# Patient Record
Sex: Female | Born: 1935
Health system: Southern US, Community
[De-identification: ages and names within clinical notes are randomized; demographics above are authoritative.]

## PROBLEM LIST (undated history)

## (undated) DIAGNOSIS — K449 Diaphragmatic hernia without obstruction or gangrene: Secondary | ICD-10-CM

## (undated) DIAGNOSIS — B019 Varicella without complication: Secondary | ICD-10-CM

## (undated) DIAGNOSIS — I499 Cardiac arrhythmia, unspecified: Secondary | ICD-10-CM

## (undated) DIAGNOSIS — K5792 Diverticulitis of intestine, part unspecified, without perforation or abscess without bleeding: Secondary | ICD-10-CM

## (undated) DIAGNOSIS — M6289 Other specified disorders of muscle: Secondary | ICD-10-CM

## (undated) DIAGNOSIS — I2510 Atherosclerotic heart disease of native coronary artery without angina pectoris: Secondary | ICD-10-CM

## (undated) DIAGNOSIS — I471 Supraventricular tachycardia, unspecified: Secondary | ICD-10-CM

## (undated) DIAGNOSIS — M1990 Unspecified osteoarthritis, unspecified site: Secondary | ICD-10-CM

## (undated) DIAGNOSIS — I4891 Unspecified atrial fibrillation: Secondary | ICD-10-CM

## (undated) DIAGNOSIS — K219 Gastro-esophageal reflux disease without esophagitis: Secondary | ICD-10-CM

## (undated) DIAGNOSIS — I700 Atherosclerosis of aorta: Secondary | ICD-10-CM

## (undated) DIAGNOSIS — R1310 Dysphagia, unspecified: Secondary | ICD-10-CM

## (undated) DIAGNOSIS — R112 Nausea with vomiting, unspecified: Secondary | ICD-10-CM

## (undated) DIAGNOSIS — Z789 Other specified health status: Secondary | ICD-10-CM

## (undated) DIAGNOSIS — I480 Paroxysmal atrial fibrillation: Secondary | ICD-10-CM

## (undated) DIAGNOSIS — R32 Unspecified urinary incontinence: Secondary | ICD-10-CM

## (undated) DIAGNOSIS — Z9889 Other specified postprocedural states: Secondary | ICD-10-CM

## (undated) DIAGNOSIS — K862 Cyst of pancreas: Secondary | ICD-10-CM

## (undated) DIAGNOSIS — H409 Unspecified glaucoma: Secondary | ICD-10-CM

## (undated) DIAGNOSIS — M7120 Synovial cyst of popliteal space [Baker], unspecified knee: Secondary | ICD-10-CM

## (undated) DIAGNOSIS — Z9289 Personal history of other medical treatment: Secondary | ICD-10-CM

## (undated) DIAGNOSIS — K5790 Diverticulosis of intestine, part unspecified, without perforation or abscess without bleeding: Secondary | ICD-10-CM

## (undated) DIAGNOSIS — Z9181 History of falling: Secondary | ICD-10-CM

## (undated) DIAGNOSIS — D699 Hemorrhagic condition, unspecified: Secondary | ICD-10-CM

## (undated) DIAGNOSIS — C801 Malignant (primary) neoplasm, unspecified: Secondary | ICD-10-CM

## (undated) DIAGNOSIS — M109 Gout, unspecified: Secondary | ICD-10-CM

## (undated) DIAGNOSIS — G43909 Migraine, unspecified, not intractable, without status migrainosus: Secondary | ICD-10-CM

## (undated) DIAGNOSIS — D649 Anemia, unspecified: Secondary | ICD-10-CM

## (undated) DIAGNOSIS — T753XXA Motion sickness, initial encounter: Secondary | ICD-10-CM

## (undated) DIAGNOSIS — Z974 Presence of external hearing-aid: Secondary | ICD-10-CM

## (undated) DIAGNOSIS — H9193 Unspecified hearing loss, bilateral: Secondary | ICD-10-CM

## (undated) DIAGNOSIS — N390 Urinary tract infection, site not specified: Secondary | ICD-10-CM

## (undated) DIAGNOSIS — M71469 Calcium deposit in bursa, unspecified knee: Secondary | ICD-10-CM

## (undated) DIAGNOSIS — R42 Dizziness and giddiness: Secondary | ICD-10-CM

## (undated) HISTORY — DX: Cyst of pancreas: K86.2

## (undated) HISTORY — DX: Paroxysmal atrial fibrillation: I48.0

## (undated) HISTORY — PX: OTHER SURGICAL HISTORY: SHX169

## (undated) HISTORY — DX: Atherosclerotic heart disease of native coronary artery without angina pectoris: I25.10

## (undated) HISTORY — DX: Supraventricular tachycardia, unspecified: I47.10

## (undated) HISTORY — DX: Other specified disorders of muscle: M62.89

## (undated) HISTORY — PX: COMBINED HYSTERECTOMY ABDOMINAL W/ A&P REPAIR / OOPHORECTOMY: SUR292

## (undated) HISTORY — PX: DILATION AND CURETTAGE OF UTERUS: SHX78

## (undated) HISTORY — DX: Diverticulosis of intestine, part unspecified, without perforation or abscess without bleeding: K57.90

## (undated) HISTORY — PX: TOTAL ABDOMINAL HYSTERECTOMY: SHX209

## (undated) HISTORY — DX: Unspecified atrial fibrillation: I48.91

## (undated) HISTORY — PX: BREAST BIOPSY: SHX20

## (undated) HISTORY — DX: Varicella without complication: B01.9

## (undated) HISTORY — DX: Diaphragmatic hernia without obstruction or gangrene: K44.9

## (undated) HISTORY — DX: Diverticulitis of intestine, part unspecified, without perforation or abscess without bleeding: K57.92

## (undated) HISTORY — DX: Unspecified urinary incontinence: R32

## (undated) HISTORY — DX: Supraventricular tachycardia: I47.1

## (undated) HISTORY — DX: Atherosclerosis of aorta: I70.0

## (undated) HISTORY — PX: CATARACT EXTRACTION W/ INTRAOCULAR LENS IMPLANT: SHX1309

## (undated) HISTORY — DX: Unspecified osteoarthritis, unspecified site: M19.90

---
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
## 1966-01-03 HISTORY — PX: VAGINAL HYSTERECTOMY: SUR661

## 1993-01-03 HISTORY — PX: BLADDER SURGERY: SHX569

## 2005-03-31 ENCOUNTER — Emergency Department (HOSPITAL_COMMUNITY): Admission: EM | Admit: 2005-03-31 | Discharge: 2005-03-31 | Payer: Self-pay | Admitting: Emergency Medicine

## 2006-03-14 ENCOUNTER — Ambulatory Visit: Payer: Self-pay | Admitting: Ophthalmology

## 2006-09-12 ENCOUNTER — Ambulatory Visit: Payer: Self-pay | Admitting: Ophthalmology

## 2009-04-06 ENCOUNTER — Inpatient Hospital Stay (HOSPITAL_COMMUNITY): Admission: EM | Admit: 2009-04-06 | Discharge: 2009-04-09 | Payer: Self-pay | Admitting: Emergency Medicine

## 2009-04-07 ENCOUNTER — Encounter (INDEPENDENT_AMBULATORY_CARE_PROVIDER_SITE_OTHER): Payer: Self-pay

## 2009-04-20 ENCOUNTER — Encounter: Admission: RE | Admit: 2009-04-20 | Discharge: 2009-04-20 | Payer: Self-pay | Admitting: Gastroenterology

## 2009-04-30 ENCOUNTER — Emergency Department (HOSPITAL_COMMUNITY): Admission: EM | Admit: 2009-04-30 | Discharge: 2009-04-30 | Payer: Self-pay | Admitting: Emergency Medicine

## 2010-03-23 LAB — COMPREHENSIVE METABOLIC PANEL
ALT: 18 U/L (ref 0–35)
AST: 30 U/L (ref 0–37)
Albumin: 3.5 g/dL (ref 3.5–5.2)
Alkaline Phosphatase: 117 U/L (ref 39–117)
BUN: 10 mg/dL (ref 6–23)
CO2: 27 mEq/L (ref 19–32)
Calcium: 9.4 mg/dL (ref 8.4–10.5)
Chloride: 104 mEq/L (ref 96–112)
Creatinine, Ser: 0.8 mg/dL (ref 0.4–1.2)
GFR calc Af Amer: >60 mL/min (ref >60)
GFR calc non Af Amer: >60 mL/min (ref >60)
Glucose, Bld: 94 mg/dL (ref 70–99)
Potassium: 3.3 mEq/L — ABNORMAL LOW (ref 3.5–5.1)
Sodium: 139 mEq/L (ref 135–145)
Total Bilirubin: 1.2 mg/dL (ref 0.3–1.2)
Total Protein: 6.9 g/dL (ref 6.0–8.3)

## 2010-03-23 LAB — CBC
HCT: 39 % (ref 36.0–46.0)
Hemoglobin: 13.8 g/dL (ref 12.0–15.0)
MCHC: 35.3 g/dL (ref 30.0–36.0)
MCV: 96 fL (ref 78.0–100.0)
Platelets: 176 10*3/uL (ref 150–400)
RBC: 4.07 MIL/uL (ref 3.87–5.11)
RDW: 12.6 % (ref 11.5–15.5)
WBC: 8.3 10*3/uL (ref 4.0–10.5)

## 2010-03-23 LAB — DIFFERENTIAL
Basophils Absolute: 0 10*3/uL (ref 0.0–0.1)
Basophils Relative: 0 % (ref 0–1)
Eosinophils Absolute: 0.1 10*3/uL (ref 0.0–0.7)
Eosinophils Relative: 1 % (ref 0–5)
Lymphocytes Relative: 13 % (ref 12–46)
Lymphs Abs: 1.1 10*3/uL (ref 0.7–4.0)
Monocytes Absolute: 0.4 10*3/uL (ref 0.1–1.0)
Monocytes Relative: 5 % (ref 3–12)
Neutro Abs: 6.7 10*3/uL (ref 1.7–7.7)
Neutrophils Relative %: 81 % — ABNORMAL HIGH (ref 43–77)

## 2010-03-23 LAB — POCT CARDIAC MARKERS
CKMB, poc: <1 ng/mL — ABNORMAL LOW (ref 1.0–8.0)
CKMB, poc: <1 ng/mL — ABNORMAL LOW (ref 1.0–8.0)
Myoglobin, poc: 43.3 ng/mL (ref 12–200)
Myoglobin, poc: 46.2 ng/mL (ref 12–200)
Troponin i, poc: <0.05 ng/mL (ref 0.00–0.09)
Troponin i, poc: <0.05 ng/mL (ref 0.00–0.09)

## 2010-03-23 LAB — LIPASE, BLOOD: Lipase: 37 U/L (ref 11–59)

## 2010-03-24 LAB — DIFFERENTIAL
Basophils Absolute: 0 10*3/uL (ref 0.0–0.1)
Basophils Absolute: 0 10*3/uL (ref 0.0–0.1)
Basophils Relative: 0 % (ref 0–1)
Basophils Relative: 0 % (ref 0–1)
Eosinophils Absolute: 0 10*3/uL (ref 0.0–0.7)
Eosinophils Absolute: 0 10*3/uL (ref 0.0–0.7)
Eosinophils Relative: 0 % (ref 0–5)
Eosinophils Relative: 0 % (ref 0–5)
Lymphocytes Relative: 11 % — ABNORMAL LOW (ref 12–46)
Lymphocytes Relative: 8 % — ABNORMAL LOW (ref 12–46)
Lymphs Abs: 0.6 10*3/uL — ABNORMAL LOW (ref 0.7–4.0)
Lymphs Abs: 0.8 10*3/uL (ref 0.7–4.0)
Monocytes Absolute: 0.7 10*3/uL (ref 0.1–1.0)
Monocytes Absolute: 1.1 10*3/uL — ABNORMAL HIGH (ref 0.1–1.0)
Monocytes Relative: 10 % (ref 3–12)
Monocytes Relative: 13 % — ABNORMAL HIGH (ref 3–12)
Neutro Abs: 4.6 10*3/uL (ref 1.7–7.7)
Neutro Abs: 8.6 10*3/uL — ABNORMAL HIGH (ref 1.7–7.7)
Neutrophils Relative %: 76 % (ref 43–77)
Neutrophils Relative %: 82 % — ABNORMAL HIGH (ref 43–77)

## 2010-03-24 LAB — CBC
HCT: 31.5 % — ABNORMAL LOW (ref 36.0–46.0)
HCT: 32.8 % — ABNORMAL LOW (ref 36.0–46.0)
HCT: 39 % (ref 36.0–46.0)
Hemoglobin: 10.6 g/dL — ABNORMAL LOW (ref 12.0–15.0)
Hemoglobin: 11.3 g/dL — ABNORMAL LOW (ref 12.0–15.0)
Hemoglobin: 13.3 g/dL (ref 12.0–15.0)
MCHC: 33.7 g/dL (ref 30.0–36.0)
MCHC: 34.1 g/dL (ref 30.0–36.0)
MCHC: 34.4 g/dL (ref 30.0–36.0)
MCV: 96.3 fL (ref 78.0–100.0)
MCV: 96.7 fL (ref 78.0–100.0)
MCV: 96.9 fL (ref 78.0–100.0)
Platelets: 122 10*3/uL — ABNORMAL LOW (ref 150–400)
Platelets: 135 10*3/uL — ABNORMAL LOW (ref 150–400)
Platelets: 177 10*3/uL (ref 150–400)
RBC: 3.25 MIL/uL — ABNORMAL LOW (ref 3.87–5.11)
RBC: 3.4 MIL/uL — ABNORMAL LOW (ref 3.87–5.11)
RBC: 4.03 MIL/uL (ref 3.87–5.11)
RDW: 13.1 % (ref 11.5–15.5)
RDW: 13.3 % (ref 11.5–15.5)
RDW: 13.8 % (ref 11.5–15.5)
WBC: 10.5 10*3/uL (ref 4.0–10.5)
WBC: 5.5 10*3/uL (ref 4.0–10.5)
WBC: 6 10*3/uL (ref 4.0–10.5)

## 2010-03-24 LAB — COMPREHENSIVE METABOLIC PANEL
ALT: 122 U/L — ABNORMAL HIGH (ref 0–35)
ALT: 132 U/L — ABNORMAL HIGH (ref 0–35)
ALT: 146 U/L — ABNORMAL HIGH (ref 0–35)
ALT: 189 U/L — ABNORMAL HIGH (ref 0–35)
ALT: 99 U/L — ABNORMAL HIGH (ref 0–35)
AST: 104 U/L — ABNORMAL HIGH (ref 0–37)
AST: 144 U/L — ABNORMAL HIGH (ref 0–37)
AST: 161 U/L — ABNORMAL HIGH (ref 0–37)
AST: 254 U/L — ABNORMAL HIGH (ref 0–37)
AST: 74 U/L — ABNORMAL HIGH (ref 0–37)
Albumin: 2.3 g/dL — ABNORMAL LOW (ref 3.5–5.2)
Albumin: 2.3 g/dL — ABNORMAL LOW (ref 3.5–5.2)
Albumin: 2.4 g/dL — ABNORMAL LOW (ref 3.5–5.2)
Albumin: 2.7 g/dL — ABNORMAL LOW (ref 3.5–5.2)
Albumin: 3.4 g/dL — ABNORMAL LOW (ref 3.5–5.2)
Alkaline Phosphatase: 189 U/L — ABNORMAL HIGH (ref 39–117)
Alkaline Phosphatase: 232 U/L — ABNORMAL HIGH (ref 39–117)
Alkaline Phosphatase: 241 U/L — ABNORMAL HIGH (ref 39–117)
Alkaline Phosphatase: 256 U/L — ABNORMAL HIGH (ref 39–117)
Alkaline Phosphatase: 258 U/L — ABNORMAL HIGH (ref 39–117)
BUN: 4 mg/dL — ABNORMAL LOW (ref 6–23)
BUN: 5 mg/dL — ABNORMAL LOW (ref 6–23)
BUN: 6 mg/dL (ref 6–23)
BUN: 6 mg/dL (ref 6–23)
BUN: 9 mg/dL (ref 6–23)
CO2: 23 mEq/L (ref 19–32)
CO2: 25 mEq/L (ref 19–32)
CO2: 25 mEq/L (ref 19–32)
CO2: 25 mEq/L (ref 19–32)
CO2: 26 mEq/L (ref 19–32)
Calcium: 7.6 mg/dL — ABNORMAL LOW (ref 8.4–10.5)
Calcium: 7.8 mg/dL — ABNORMAL LOW (ref 8.4–10.5)
Calcium: 7.9 mg/dL — ABNORMAL LOW (ref 8.4–10.5)
Calcium: 8.1 mg/dL — ABNORMAL LOW (ref 8.4–10.5)
Calcium: 9.2 mg/dL (ref 8.4–10.5)
Chloride: 102 mEq/L (ref 96–112)
Chloride: 109 mEq/L (ref 96–112)
Chloride: 110 mEq/L (ref 96–112)
Chloride: 112 mEq/L (ref 96–112)
Chloride: 113 mEq/L — ABNORMAL HIGH (ref 96–112)
Creatinine, Ser: 0.62 mg/dL (ref 0.4–1.2)
Creatinine, Ser: 0.63 mg/dL (ref 0.4–1.2)
Creatinine, Ser: 0.68 mg/dL (ref 0.4–1.2)
Creatinine, Ser: 0.7 mg/dL (ref 0.4–1.2)
Creatinine, Ser: 0.82 mg/dL (ref 0.4–1.2)
GFR calc Af Amer: >60 mL/min (ref >60)
GFR calc Af Amer: >60 mL/min (ref >60)
GFR calc Af Amer: >60 mL/min (ref >60)
GFR calc Af Amer: >60 mL/min (ref >60)
GFR calc Af Amer: >60 mL/min (ref >60)
GFR calc non Af Amer: >60 mL/min (ref >60)
GFR calc non Af Amer: >60 mL/min (ref >60)
GFR calc non Af Amer: >60 mL/min (ref >60)
GFR calc non Af Amer: >60 mL/min (ref >60)
GFR calc non Af Amer: >60 mL/min (ref >60)
Glucose, Bld: 100 mg/dL — ABNORMAL HIGH (ref 70–99)
Glucose, Bld: 105 mg/dL — ABNORMAL HIGH (ref 70–99)
Glucose, Bld: 105 mg/dL — ABNORMAL HIGH (ref 70–99)
Glucose, Bld: 116 mg/dL — ABNORMAL HIGH (ref 70–99)
Glucose, Bld: 123 mg/dL — ABNORMAL HIGH (ref 70–99)
Potassium: 3.3 mEq/L — ABNORMAL LOW (ref 3.5–5.1)
Potassium: 3.5 mEq/L (ref 3.5–5.1)
Potassium: 3.6 mEq/L (ref 3.5–5.1)
Potassium: 3.6 mEq/L (ref 3.5–5.1)
Potassium: 3.6 mEq/L (ref 3.5–5.1)
Sodium: 137 mEq/L (ref 135–145)
Sodium: 138 mEq/L (ref 135–145)
Sodium: 140 mEq/L (ref 135–145)
Sodium: 141 mEq/L (ref 135–145)
Sodium: 143 mEq/L (ref 135–145)
Total Bilirubin: 1.3 mg/dL — ABNORMAL HIGH (ref 0.3–1.2)
Total Bilirubin: 1.4 mg/dL — ABNORMAL HIGH (ref 0.3–1.2)
Total Bilirubin: 2.5 mg/dL — ABNORMAL HIGH (ref 0.3–1.2)
Total Bilirubin: 3.5 mg/dL — ABNORMAL HIGH (ref 0.3–1.2)
Total Bilirubin: 4.1 mg/dL — ABNORMAL HIGH (ref 0.3–1.2)
Total Protein: 5 g/dL — ABNORMAL LOW (ref 6.0–8.3)
Total Protein: 5.1 g/dL — ABNORMAL LOW (ref 6.0–8.3)
Total Protein: 5.3 g/dL — ABNORMAL LOW (ref 6.0–8.3)
Total Protein: 5.5 g/dL — ABNORMAL LOW (ref 6.0–8.3)
Total Protein: 7.1 g/dL (ref 6.0–8.3)

## 2010-03-24 LAB — URINE MICROSCOPIC-ADD ON

## 2010-03-24 LAB — URINALYSIS, ROUTINE W REFLEX MICROSCOPIC
Bilirubin Urine: NEGATIVE
Glucose, UA: NEGATIVE mg/dL
Ketones, ur: NEGATIVE mg/dL
Leukocytes, UA: NEGATIVE
Nitrite: POSITIVE — AB
Protein, ur: NEGATIVE mg/dL
Specific Gravity, Urine: 1.006 (ref 1.005–1.030)
Urobilinogen, UA: 0.2 mg/dL (ref 0.0–1.0)
pH: 7 (ref 5.0–8.0)

## 2010-03-24 LAB — POCT CARDIAC MARKERS
CKMB, poc: <1 ng/mL — ABNORMAL LOW (ref 1.0–8.0)
CKMB, poc: <1 ng/mL — ABNORMAL LOW (ref 1.0–8.0)
Myoglobin, poc: 53.9 ng/mL (ref 12–200)
Myoglobin, poc: 79.8 ng/mL (ref 12–200)
Troponin i, poc: 0.05 ng/mL (ref 0.00–0.09)
Troponin i, poc: <0.05 ng/mL (ref 0.00–0.09)

## 2010-03-24 LAB — TROPONIN I: Troponin I: 0.05 ng/mL (ref 0.00–0.06)

## 2010-03-24 LAB — CK TOTAL AND CKMB (NOT AT ARMC)
CK, MB: 1 ng/mL (ref 0.3–4.0)
Relative Index: INVALID (ref 0.0–2.5)
Total CK: 52 U/L (ref 7–177)

## 2010-03-24 LAB — LIPASE, BLOOD: Lipase: 47 U/L (ref 11–59)

## 2011-01-12 DIAGNOSIS — H40039 Anatomical narrow angle, unspecified eye: Secondary | ICD-10-CM | POA: Diagnosis not present

## 2011-01-18 DIAGNOSIS — M899 Disorder of bone, unspecified: Secondary | ICD-10-CM | POA: Diagnosis not present

## 2011-01-18 DIAGNOSIS — N390 Urinary tract infection, site not specified: Secondary | ICD-10-CM | POA: Diagnosis not present

## 2011-01-18 DIAGNOSIS — M949 Disorder of cartilage, unspecified: Secondary | ICD-10-CM | POA: Diagnosis not present

## 2011-01-18 DIAGNOSIS — N63 Unspecified lump in unspecified breast: Secondary | ICD-10-CM | POA: Diagnosis not present

## 2011-01-18 DIAGNOSIS — F411 Generalized anxiety disorder: Secondary | ICD-10-CM | POA: Diagnosis not present

## 2011-01-18 DIAGNOSIS — N3090 Cystitis, unspecified without hematuria: Secondary | ICD-10-CM | POA: Diagnosis not present

## 2011-01-26 DIAGNOSIS — H4010X0 Unspecified open-angle glaucoma, stage unspecified: Secondary | ICD-10-CM | POA: Diagnosis not present

## 2011-04-14 DIAGNOSIS — T148 Other injury of unspecified body region: Secondary | ICD-10-CM | POA: Diagnosis not present

## 2011-04-26 DIAGNOSIS — H4010X0 Unspecified open-angle glaucoma, stage unspecified: Secondary | ICD-10-CM | POA: Diagnosis not present

## 2011-05-10 DIAGNOSIS — H4010X0 Unspecified open-angle glaucoma, stage unspecified: Secondary | ICD-10-CM | POA: Diagnosis not present

## 2011-05-23 DIAGNOSIS — Z79890 Hormone replacement therapy (postmenopausal): Secondary | ICD-10-CM | POA: Diagnosis not present

## 2011-05-23 DIAGNOSIS — M899 Disorder of bone, unspecified: Secondary | ICD-10-CM | POA: Diagnosis not present

## 2011-05-23 DIAGNOSIS — M949 Disorder of cartilage, unspecified: Secondary | ICD-10-CM | POA: Diagnosis not present

## 2011-05-23 DIAGNOSIS — N3090 Cystitis, unspecified without hematuria: Secondary | ICD-10-CM | POA: Diagnosis not present

## 2011-05-23 DIAGNOSIS — N6019 Diffuse cystic mastopathy of unspecified breast: Secondary | ICD-10-CM | POA: Diagnosis not present

## 2011-05-23 DIAGNOSIS — Z1231 Encounter for screening mammogram for malignant neoplasm of breast: Secondary | ICD-10-CM | POA: Diagnosis not present

## 2011-05-23 DIAGNOSIS — R159 Full incontinence of feces: Secondary | ICD-10-CM | POA: Diagnosis not present

## 2011-05-23 DIAGNOSIS — Z803 Family history of malignant neoplasm of breast: Secondary | ICD-10-CM | POA: Diagnosis not present

## 2011-05-23 DIAGNOSIS — R339 Retention of urine, unspecified: Secondary | ICD-10-CM | POA: Diagnosis not present

## 2011-05-23 DIAGNOSIS — N318 Other neuromuscular dysfunction of bladder: Secondary | ICD-10-CM | POA: Diagnosis not present

## 2011-08-09 DIAGNOSIS — H4010X0 Unspecified open-angle glaucoma, stage unspecified: Secondary | ICD-10-CM | POA: Diagnosis not present

## 2011-09-22 LAB — HM DEXA SCAN

## 2011-10-04 DIAGNOSIS — Z23 Encounter for immunization: Secondary | ICD-10-CM | POA: Diagnosis not present

## 2011-10-04 DIAGNOSIS — B308 Other viral conjunctivitis: Secondary | ICD-10-CM | POA: Diagnosis not present

## 2011-11-13 DIAGNOSIS — N200 Calculus of kidney: Secondary | ICD-10-CM | POA: Diagnosis not present

## 2011-11-13 DIAGNOSIS — R3129 Other microscopic hematuria: Secondary | ICD-10-CM | POA: Diagnosis not present

## 2011-11-15 DIAGNOSIS — K219 Gastro-esophageal reflux disease without esophagitis: Secondary | ICD-10-CM | POA: Diagnosis not present

## 2011-11-23 DIAGNOSIS — Z79899 Other long term (current) drug therapy: Secondary | ICD-10-CM | POA: Diagnosis not present

## 2011-11-23 DIAGNOSIS — M810 Age-related osteoporosis without current pathological fracture: Secondary | ICD-10-CM | POA: Diagnosis not present

## 2011-11-23 DIAGNOSIS — R030 Elevated blood-pressure reading, without diagnosis of hypertension: Secondary | ICD-10-CM | POA: Diagnosis not present

## 2011-11-23 DIAGNOSIS — E7800 Pure hypercholesterolemia, unspecified: Secondary | ICD-10-CM | POA: Diagnosis not present

## 2012-01-04 HISTORY — PX: CHOLECYSTECTOMY: SHX55

## 2012-01-16 DIAGNOSIS — D485 Neoplasm of uncertain behavior of skin: Secondary | ICD-10-CM | POA: Diagnosis not present

## 2012-01-16 DIAGNOSIS — L821 Other seborrheic keratosis: Secondary | ICD-10-CM | POA: Diagnosis not present

## 2012-02-07 DIAGNOSIS — H4010X0 Unspecified open-angle glaucoma, stage unspecified: Secondary | ICD-10-CM | POA: Diagnosis not present

## 2012-02-15 DIAGNOSIS — H40039 Anatomical narrow angle, unspecified eye: Secondary | ICD-10-CM | POA: Diagnosis not present

## 2012-03-08 DIAGNOSIS — H2510 Age-related nuclear cataract, unspecified eye: Secondary | ICD-10-CM | POA: Diagnosis not present

## 2012-03-12 DIAGNOSIS — I4949 Other premature depolarization: Secondary | ICD-10-CM | POA: Diagnosis not present

## 2012-05-08 DIAGNOSIS — H4010X0 Unspecified open-angle glaucoma, stage unspecified: Secondary | ICD-10-CM | POA: Diagnosis not present

## 2012-05-29 DIAGNOSIS — F411 Generalized anxiety disorder: Secondary | ICD-10-CM | POA: Diagnosis not present

## 2012-05-29 DIAGNOSIS — M899 Disorder of bone, unspecified: Secondary | ICD-10-CM | POA: Diagnosis not present

## 2012-05-29 DIAGNOSIS — M810 Age-related osteoporosis without current pathological fracture: Secondary | ICD-10-CM | POA: Diagnosis not present

## 2012-05-29 DIAGNOSIS — Z1231 Encounter for screening mammogram for malignant neoplasm of breast: Secondary | ICD-10-CM | POA: Diagnosis not present

## 2012-05-29 DIAGNOSIS — N3090 Cystitis, unspecified without hematuria: Secondary | ICD-10-CM | POA: Diagnosis not present

## 2012-05-29 DIAGNOSIS — R339 Retention of urine, unspecified: Secondary | ICD-10-CM | POA: Diagnosis not present

## 2012-05-29 DIAGNOSIS — Z803 Family history of malignant neoplasm of breast: Secondary | ICD-10-CM | POA: Diagnosis not present

## 2012-05-29 DIAGNOSIS — Z79899 Other long term (current) drug therapy: Secondary | ICD-10-CM | POA: Diagnosis not present

## 2012-05-29 DIAGNOSIS — Z780 Asymptomatic menopausal state: Secondary | ICD-10-CM | POA: Diagnosis not present

## 2012-05-29 DIAGNOSIS — N6019 Diffuse cystic mastopathy of unspecified breast: Secondary | ICD-10-CM | POA: Diagnosis not present

## 2012-05-29 DIAGNOSIS — N63 Unspecified lump in unspecified breast: Secondary | ICD-10-CM | POA: Diagnosis not present

## 2012-05-29 DIAGNOSIS — Z79890 Hormone replacement therapy (postmenopausal): Secondary | ICD-10-CM | POA: Diagnosis not present

## 2012-06-01 DIAGNOSIS — H353190 Nonexudative age-related macular degeneration, unspecified eye, stage unspecified: Secondary | ICD-10-CM | POA: Diagnosis not present

## 2012-06-05 ENCOUNTER — Other Ambulatory Visit: Payer: Self-pay | Admitting: Dermatology

## 2012-06-05 DIAGNOSIS — D0470 Carcinoma in situ of skin of unspecified lower limb, including hip: Secondary | ICD-10-CM | POA: Diagnosis not present

## 2012-06-05 DIAGNOSIS — L821 Other seborrheic keratosis: Secondary | ICD-10-CM | POA: Diagnosis not present

## 2012-06-05 DIAGNOSIS — D485 Neoplasm of uncertain behavior of skin: Secondary | ICD-10-CM | POA: Diagnosis not present

## 2012-06-05 DIAGNOSIS — Z85828 Personal history of other malignant neoplasm of skin: Secondary | ICD-10-CM | POA: Diagnosis not present

## 2012-06-12 DIAGNOSIS — H2510 Age-related nuclear cataract, unspecified eye: Secondary | ICD-10-CM | POA: Diagnosis not present

## 2012-06-14 DIAGNOSIS — D0470 Carcinoma in situ of skin of unspecified lower limb, including hip: Secondary | ICD-10-CM | POA: Diagnosis not present

## 2012-06-14 DIAGNOSIS — Z85828 Personal history of other malignant neoplasm of skin: Secondary | ICD-10-CM | POA: Diagnosis not present

## 2012-07-03 ENCOUNTER — Ambulatory Visit: Payer: Self-pay | Admitting: Ophthalmology

## 2012-07-03 DIAGNOSIS — Z885 Allergy status to narcotic agent status: Secondary | ICD-10-CM | POA: Diagnosis not present

## 2012-07-03 DIAGNOSIS — Z01812 Encounter for preprocedural laboratory examination: Secondary | ICD-10-CM | POA: Diagnosis not present

## 2012-07-03 DIAGNOSIS — Z01810 Encounter for preprocedural cardiovascular examination: Secondary | ICD-10-CM | POA: Diagnosis not present

## 2012-07-03 DIAGNOSIS — K219 Gastro-esophageal reflux disease without esophagitis: Secondary | ICD-10-CM | POA: Diagnosis not present

## 2012-07-03 DIAGNOSIS — H2510 Age-related nuclear cataract, unspecified eye: Secondary | ICD-10-CM | POA: Diagnosis not present

## 2012-07-03 DIAGNOSIS — E162 Hypoglycemia, unspecified: Secondary | ICD-10-CM | POA: Diagnosis not present

## 2012-07-03 DIAGNOSIS — M810 Age-related osteoporosis without current pathological fracture: Secondary | ICD-10-CM | POA: Diagnosis not present

## 2012-07-03 DIAGNOSIS — Z85828 Personal history of other malignant neoplasm of skin: Secondary | ICD-10-CM | POA: Diagnosis not present

## 2012-07-03 DIAGNOSIS — K5730 Diverticulosis of large intestine without perforation or abscess without bleeding: Secondary | ICD-10-CM | POA: Diagnosis not present

## 2012-07-03 DIAGNOSIS — R42 Dizziness and giddiness: Secondary | ICD-10-CM | POA: Diagnosis not present

## 2012-07-03 DIAGNOSIS — I10 Essential (primary) hypertension: Secondary | ICD-10-CM | POA: Diagnosis not present

## 2012-07-03 DIAGNOSIS — Z9889 Other specified postprocedural states: Secondary | ICD-10-CM | POA: Diagnosis not present

## 2012-07-03 DIAGNOSIS — M129 Arthropathy, unspecified: Secondary | ICD-10-CM | POA: Diagnosis not present

## 2012-07-03 LAB — CBC WITH DIFFERENTIAL/PLATELET
Basophil #: 0.1 x10 3/mm 3
Basophil %: 0.8 %
Eosinophil #: 0.1 x10 3/mm 3
Eosinophil %: 0.9 %
HCT: 36.9 %
HGB: 12.7 g/dL
Lymphocyte %: 18 %
Lymphs Abs: 1.2 x10 3/mm 3
MCH: 32 pg
MCHC: 34.3 g/dL
MCV: 93 fL
Monocyte #: 0.6 "x10 3/mm "
Monocyte %: 9.4 %
Neutrophil #: 4.5 x10 3/mm 3
Neutrophil %: 70.9 %
Platelet: 199 x10 3/mm 3
RBC: 3.96 X10 6/mm 3
RDW: 13.1 %
WBC: 6.4 x10 3/mm 3

## 2012-07-03 LAB — PROTIME-INR
INR: 0.9
Prothrombin Time: 12.4 secs (ref 11.5–14.7)

## 2012-07-03 LAB — APTT: Activated PTT: 34.3 s

## 2012-07-09 ENCOUNTER — Ambulatory Visit: Payer: Self-pay | Admitting: Ophthalmology

## 2012-07-09 DIAGNOSIS — Z885 Allergy status to narcotic agent status: Secondary | ICD-10-CM | POA: Diagnosis not present

## 2012-07-09 DIAGNOSIS — K219 Gastro-esophageal reflux disease without esophagitis: Secondary | ICD-10-CM | POA: Diagnosis not present

## 2012-07-09 DIAGNOSIS — H2510 Age-related nuclear cataract, unspecified eye: Secondary | ICD-10-CM | POA: Diagnosis not present

## 2012-07-09 DIAGNOSIS — Z79890 Hormone replacement therapy (postmenopausal): Secondary | ICD-10-CM | POA: Diagnosis not present

## 2012-07-09 DIAGNOSIS — H269 Unspecified cataract: Secondary | ICD-10-CM | POA: Diagnosis not present

## 2012-07-09 DIAGNOSIS — M810 Age-related osteoporosis without current pathological fracture: Secondary | ICD-10-CM | POA: Diagnosis not present

## 2012-07-09 DIAGNOSIS — R42 Dizziness and giddiness: Secondary | ICD-10-CM | POA: Diagnosis not present

## 2012-07-09 DIAGNOSIS — Z85828 Personal history of other malignant neoplasm of skin: Secondary | ICD-10-CM | POA: Diagnosis not present

## 2012-07-09 DIAGNOSIS — E162 Hypoglycemia, unspecified: Secondary | ICD-10-CM | POA: Diagnosis not present

## 2012-07-09 DIAGNOSIS — K5730 Diverticulosis of large intestine without perforation or abscess without bleeding: Secondary | ICD-10-CM | POA: Diagnosis not present

## 2012-07-09 DIAGNOSIS — M129 Arthropathy, unspecified: Secondary | ICD-10-CM | POA: Diagnosis not present

## 2012-10-05 DIAGNOSIS — Z23 Encounter for immunization: Secondary | ICD-10-CM | POA: Diagnosis not present

## 2012-12-07 DIAGNOSIS — H353190 Nonexudative age-related macular degeneration, unspecified eye, stage unspecified: Secondary | ICD-10-CM | POA: Diagnosis not present

## 2013-02-02 ENCOUNTER — Emergency Department (HOSPITAL_COMMUNITY): Payer: Medicare Other

## 2013-02-02 ENCOUNTER — Other Ambulatory Visit: Payer: Self-pay

## 2013-02-02 ENCOUNTER — Encounter (HOSPITAL_COMMUNITY): Payer: Self-pay | Admitting: Emergency Medicine

## 2013-02-02 ENCOUNTER — Inpatient Hospital Stay (HOSPITAL_COMMUNITY)
Admission: EM | Admit: 2013-02-02 | Discharge: 2013-02-04 | DRG: 690 | Disposition: A | Discharge status: Home / Self Care | Payer: Medicare Other | Attending: Internal Medicine | Admitting: Internal Medicine

## 2013-02-02 DIAGNOSIS — R634 Abnormal weight loss: Secondary | ICD-10-CM | POA: Diagnosis not present

## 2013-02-02 DIAGNOSIS — N3090 Cystitis, unspecified without hematuria: Secondary | ICD-10-CM | POA: Diagnosis not present

## 2013-02-02 DIAGNOSIS — N179 Acute kidney failure, unspecified: Secondary | ICD-10-CM

## 2013-02-02 DIAGNOSIS — Z8041 Family history of malignant neoplasm of ovary: Secondary | ICD-10-CM

## 2013-02-02 DIAGNOSIS — R52 Pain, unspecified: Secondary | ICD-10-CM | POA: Diagnosis not present

## 2013-02-02 DIAGNOSIS — R339 Retention of urine, unspecified: Secondary | ICD-10-CM | POA: Diagnosis not present

## 2013-02-02 DIAGNOSIS — Z832 Family history of diseases of the blood and blood-forming organs and certain disorders involving the immune mechanism: Secondary | ICD-10-CM

## 2013-02-02 DIAGNOSIS — R109 Unspecified abdominal pain: Secondary | ICD-10-CM

## 2013-02-02 DIAGNOSIS — R0789 Other chest pain: Secondary | ICD-10-CM

## 2013-02-02 DIAGNOSIS — Z7982 Long term (current) use of aspirin: Secondary | ICD-10-CM | POA: Diagnosis not present

## 2013-02-02 DIAGNOSIS — Z681 Body mass index (BMI) 19 or less, adult: Secondary | ICD-10-CM

## 2013-02-02 DIAGNOSIS — M949 Disorder of cartilage, unspecified: Secondary | ICD-10-CM | POA: Diagnosis not present

## 2013-02-02 DIAGNOSIS — M899 Disorder of bone, unspecified: Secondary | ICD-10-CM | POA: Diagnosis not present

## 2013-02-02 DIAGNOSIS — Z79899 Other long term (current) drug therapy: Secondary | ICD-10-CM

## 2013-02-02 DIAGNOSIS — M8580 Other specified disorders of bone density and structure, unspecified site: Secondary | ICD-10-CM | POA: Diagnosis present

## 2013-02-02 DIAGNOSIS — M549 Dorsalgia, unspecified: Secondary | ICD-10-CM | POA: Diagnosis not present

## 2013-02-02 DIAGNOSIS — N281 Cyst of kidney, acquired: Secondary | ICD-10-CM | POA: Diagnosis not present

## 2013-02-02 DIAGNOSIS — R609 Edema, unspecified: Secondary | ICD-10-CM | POA: Diagnosis not present

## 2013-02-02 DIAGNOSIS — K5730 Diverticulosis of large intestine without perforation or abscess without bleeding: Secondary | ICD-10-CM | POA: Diagnosis not present

## 2013-02-02 DIAGNOSIS — R935 Abnormal findings on diagnostic imaging of other abdominal regions, including retroperitoneum: Secondary | ICD-10-CM | POA: Diagnosis not present

## 2013-02-02 DIAGNOSIS — N19 Unspecified kidney failure: Secondary | ICD-10-CM

## 2013-02-02 DIAGNOSIS — N289 Disorder of kidney and ureter, unspecified: Secondary | ICD-10-CM | POA: Diagnosis not present

## 2013-02-02 DIAGNOSIS — Z803 Family history of malignant neoplasm of breast: Secondary | ICD-10-CM | POA: Diagnosis not present

## 2013-02-02 DIAGNOSIS — J984 Other disorders of lung: Secondary | ICD-10-CM | POA: Diagnosis not present

## 2013-02-02 DIAGNOSIS — R10A1 Flank pain, right side: Secondary | ICD-10-CM

## 2013-02-02 HISTORY — DX: Personal history of other medical treatment: Z92.89

## 2013-02-02 HISTORY — DX: Anemia, unspecified: D64.9

## 2013-02-02 HISTORY — DX: Unspecified glaucoma: H40.9

## 2013-02-02 HISTORY — DX: Hemorrhagic condition, unspecified: D69.9

## 2013-02-02 HISTORY — DX: Migraine, unspecified, not intractable, without status migrainosus: G43.909

## 2013-02-02 LAB — CBC WITH DIFFERENTIAL/PLATELET
Basophils Absolute: 0 10*3/uL (ref 0.0–0.1)
Basophils Relative: 0 % (ref 0–1)
Eosinophils Absolute: 0.1 10*3/uL (ref 0.0–0.7)
Eosinophils Relative: 2 % (ref 0–5)
HCT: 33.4 % — ABNORMAL LOW (ref 36.0–46.0)
Hemoglobin: 11.3 g/dL — ABNORMAL LOW (ref 12.0–15.0)
Lymphocytes Relative: 20 % (ref 12–46)
Lymphs Abs: 1.4 10*3/uL (ref 0.7–4.0)
MCH: 31.8 pg (ref 26.0–34.0)
MCHC: 33.8 g/dL (ref 30.0–36.0)
MCV: 94.1 fL (ref 78.0–100.0)
Monocytes Absolute: 0.7 10*3/uL (ref 0.1–1.0)
Monocytes Relative: 10 % (ref 3–12)
Neutro Abs: 4.7 10*3/uL (ref 1.7–7.7)
Neutrophils Relative %: 68 % (ref 43–77)
Platelets: 179 10*3/uL (ref 150–400)
RBC: 3.55 MIL/uL — ABNORMAL LOW (ref 3.87–5.11)
RDW: 13 % (ref 11.5–15.5)
WBC: 6.9 10*3/uL (ref 4.0–10.5)

## 2013-02-02 LAB — POCT I-STAT TROPONIN I: Troponin i, poc: 0 ng/mL (ref 0.00–0.08)

## 2013-02-02 MED ORDER — FENTANYL CITRATE 0.05 MG/ML IJ SOLN
50.0000 ug | Freq: Once | INTRAMUSCULAR | Status: AC
  Administered 2013-02-02: 50 ug via INTRAVENOUS
  Filled 2013-02-02: qty 2

## 2013-02-02 MED ORDER — SODIUM CHLORIDE 0.9 % IV BOLUS (SEPSIS)
500.0000 mL | Freq: Once | INTRAVENOUS | Status: AC
  Administered 2013-02-02: 500 mL via INTRAVENOUS

## 2013-02-02 MED ORDER — IBUPROFEN 400 MG PO TABS
400.0000 mg | ORAL_TABLET | Freq: Once | ORAL | Status: AC
  Administered 2013-02-02: 400 mg via ORAL
  Filled 2013-02-02: qty 1

## 2013-02-02 MED ORDER — METOCLOPRAMIDE HCL 5 MG/ML IJ SOLN
5.0000 mg | Freq: Once | INTRAMUSCULAR | Status: AC
  Administered 2013-02-02: 5 mg via INTRAVENOUS
  Filled 2013-02-02: qty 2

## 2013-02-02 MED ORDER — SODIUM CHLORIDE 0.9 % IV BOLUS (SEPSIS)
250.0000 mL | Freq: Once | INTRAVENOUS | Status: AC
  Administered 2013-02-02: 250 mL via INTRAVENOUS

## 2013-02-02 NOTE — ED Provider Notes (Signed)
CSN: [id number]     Arrival date & time 02/02/13  2117 History   First MD Initiated Contact with Patient 02/02/13 2122     Chief Complaint  Patient presents with  . Back Pain   (Consider location/radiation/quality/duration/timing/severity/associated sxs/prior Treatment) Patient is a 80 y.o. female presenting with flank pain. The history is provided by the patient and a relative.  Flank Pain This is a recurrent problem. The current episode started today. The problem occurs intermittently. The problem has been waxing and waning. Associated symptoms include diaphoresis and nausea. Pertinent negatives include no abdominal pain, anorexia, arthralgias, chest pain, chills, congestion, coughing, fever, headaches, myalgias, neck pain, sore throat, urinary symptoms or vomiting. Nothing aggravates the symptoms. She has tried nothing for the symptoms. The treatment provided no relief.    80 year old female chief complaint of right CVA tenderness. Patient states that she has had pain there is sudden onset about 6 hours ago. Patient states the pain comes and goes is really severe. Patient states that with the pain she gets some nausea but has not vomited with this. Patient with a similar history of this about 3 years ago. Patient with history of bladder tacking procedure with recurrent UTIs. Patient takes Macrobid prophylactically for this. Patient self caths at the end of the night due to urinary retention. Patient was picked up by EMS and I given 50 of fentanyl in the resolution of the back pain. Patient currently experiencing nausea. Sinus bradycardia on EKG. Patient denies any fevers or chills. Patient denies any abdominal pain. Patient denies any dysuria.  History reviewed. No pertinent past medical history. History reviewed. No pertinent past surgical history. No family history on file. History  Substance Use Topics  . Smoking status: Never Smoker   . Smokeless tobacco: Not on file  . Alcohol Use: No     OB History   Grav Para Term Preterm Abortions TAB SAB Ect Mult Living                 Review of Systems  Constitutional: Positive for diaphoresis. Negative for fever and chills.  HENT: Negative for congestion, rhinorrhea and sore throat.   Eyes: Negative for redness and visual disturbance.  Respiratory: Negative for cough, shortness of breath and wheezing.   Cardiovascular: Negative for chest pain and palpitations.  Gastrointestinal: Positive for nausea. Negative for vomiting, abdominal pain and anorexia.  Genitourinary: Positive for flank pain. Negative for dysuria, urgency, vaginal bleeding and vaginal discharge.  Musculoskeletal: Negative for arthralgias, myalgias and neck pain.  Skin: Negative for pallor and wound.  Neurological: Negative for dizziness and headaches.    Allergies  Codeine  Home Medications   Current Outpatient Rx  Name  Route  Sig  Dispense  Refill  . aspirin 81 MG tablet   Oral   Take 81 mg by mouth daily.         . bimatoprost (LUMIGAN) 0.03 % ophthalmic solution   Both Eyes   Place 1 drop into both eyes at bedtime.         . calcium carbonate (TUMS - DOSED IN MG ELEMENTAL CALCIUM) 500 MG chewable tablet   Oral   Chew 1 tablet by mouth daily.         . cholecalciferol (VITAMIN D) 1000 UNITS tablet   Oral   Take 1,000 Units by mouth daily.         Marland Kitchen estrogens, conjugated, (PREMARIN) 0.625 MG tablet   Oral   Take 0.625 mg by mouth  daily. 1/2 tablet         . Multiple Vitamins-Minerals (ICAPS) TABS   Oral   Take 1 tablet by mouth daily.         . Multiple Vitamins-Minerals (MULTIVITAMIN WITH MINERALS) tablet   Oral   Take 1 tablet by mouth daily.         . nitrofurantoin (MACRODANTIN) 100 MG capsule   Oral   Take 100 mg by mouth 4 (four) times daily.          BP 129/63  Pulse 60  Temp(Src) 97.8 F (36.6 C) (Oral)  Resp 14  SpO2 98% Physical Exam  Constitutional: She is oriented to person, place, and time. She  appears cachectic. She is cooperative. No distress.  HENT:  Head: Normocephalic and atraumatic.  Eyes: EOM are normal. Pupils are equal, round, and reactive to light.  Neck: Normal range of motion. Neck supple.  Cardiovascular: Normal rate and regular rhythm.  Exam reveals no gallop and no friction rub.   No murmur heard. Pulmonary/Chest: Effort normal. She has no wheezes. She has no rales.   She exhibits no tenderness.  Abdominal: Soft. She exhibits no distension. There is no tenderness. There is no rebound and no guarding.  No noted CVA tenderness.  Musculoskeletal: She exhibits no edema and no tenderness.  Neurological: She is alert and oriented to person, place, and time.  Skin: Skin is warm and dry. She is not diaphoretic.  Psychiatric: She has a normal mood and affect. Her behavior is normal.    ED Course  Procedures (including critical care time) Labs Review Labs Reviewed  URINALYSIS, ROUTINE W REFLEX MICROSCOPIC - Abnormal; Notable for the following:    APPearance CLOUDY (*)    Hgb urine dipstick TRACE (*)    All other components within normal limits  CBC WITH DIFFERENTIAL - Abnormal; Notable for the following:    RBC 3.55 (*)    Hemoglobin 11.3 (*)    HCT 33.4 (*)    All other components within normal limits  COMPREHENSIVE METABOLIC PANEL - Abnormal; Notable for the following:    Glucose, Bld 123 (*)    BUN 99 (*)    Creatinine, Ser 16.40 (*)    Total Bilirubin 0.2 (*)    GFR calc non Af Amer 2 (*)    GFR calc Af Amer 2 (*)    All other components within normal limits  URINE CULTURE  LIPASE, BLOOD  URINE MICROSCOPIC-ADD ON  POCT I-STAT TROPONIN I   Imaging Review Ct Abdomen Pelvis Wo Contrast  02/03/2013   ADDENDUM REPORT: 02/03/2013 00:26  ADDENDUM: There is a rounded calcification along the course of the right ureter at the level L4 vertebral body seen on coronal image 25 and axial image 36. This is felt most likely to represent a vascular calcification. There  is no hydronephrosis on the right.   Electronically Signed   By: Suzy Bouchard M.D.   On: 02/03/2013 00:26   02/03/2013   CLINICAL DATA:  Right flank pain and nausea  EXAM: CT ABDOMEN AND PELVIS WITHOUT CONTRAST  TECHNIQUE: Multidetector CT imaging of the abdomen and pelvis was performed following the standard protocol without intravenous contrast.  COMPARISON:  MR MRCP dated 04/20/2009  FINDINGS: There is chronic appearing scarring and nodularity in the medial right lung base with mild bronchiectasis (image 8).  Non IV contrast images demonstrate no focal hepatic lesion. There is pneumobilia within the left hepatic lobe. Post cholecystectomy. The pancreas, spleen,  adrenal glands are normal. No nephrolithiasis or ureterolithiasis. Low-density cyst extending from the anterior cortex of the left kidney.  Stomach, its small bowel, cecum normal. The appendix is not clearly identified however there are no secondary signs of appendicitis. Multiple diverticula through the sigmoid region without acute inflammation.  Abdominal aorta is normal caliber. No retroperitoneal periportal lymphadenopathy.  Post hysterectomy anatomy. The bladder normal. No bladder stones. There are multiple rounded vascular calcifications in the pelvis. No pelvic lymphadenopathy. No aggressive osseous lesion.  IMPRESSION: 1. No nephrolithiasis or ureterolithiasis. 2. Reason identified but no secondary signs of appendicitis. 3. Pneumobilia in the left hepatic lobe likely relates to prior sphincterotomy. 4. Sigmoid diverticulosis without diverticulitis  Electronically Signed: By: Suzy Bouchard M.D. On: 02/02/2013 23:53    EKG Interpretation   None       MDM   1. Right flank pain   2. Renal failure, acute     80 year old female with right-sided flank pain. Patient with no known history of kidney stones. Patient with history of obstructive uropathy. Concern for possible nephrolithiasis possible AAA will obtain CT scan abd pelvis w/  contrast. Urine studies, cbc, cmp, lipase, trop  Patient having recurrent pain while in the ED. Patient given Tylenol with some improvement. The patient noted to have acute kidney injury based on the lab results. GFR estimate at 2.  We'll CT scan without contrast.  Negative study, patient with pain controlled with fentanyl, spoke with radiology who recommends MRA to eval for aortic dissection.   Turned over to Dr. Betsey Holiday, case discussed in detail.   Deno Etienne, MD 02/03/13 [phone]

## 2013-02-02 NOTE — ED Notes (Signed)
The pt arrived by [facility] from home.  Rt posterior rib pain.  No known injury iv per ems.  Fentanyl 50 mg and zofran 8mg  iv enroute

## 2013-02-03 ENCOUNTER — Encounter (HOSPITAL_COMMUNITY): Payer: Self-pay | Admitting: Emergency Medicine

## 2013-02-03 ENCOUNTER — Emergency Department (HOSPITAL_COMMUNITY): Payer: Medicare Other

## 2013-02-03 DIAGNOSIS — R609 Edema, unspecified: Secondary | ICD-10-CM

## 2013-02-03 DIAGNOSIS — N19 Unspecified kidney failure: Secondary | ICD-10-CM | POA: Insufficient documentation

## 2013-02-03 DIAGNOSIS — Z832 Family history of diseases of the blood and blood-forming organs and certain disorders involving the immune mechanism: Secondary | ICD-10-CM

## 2013-02-03 DIAGNOSIS — R0789 Other chest pain: Secondary | ICD-10-CM | POA: Diagnosis present

## 2013-02-03 DIAGNOSIS — N3090 Cystitis, unspecified without hematuria: Principal | ICD-10-CM

## 2013-02-03 DIAGNOSIS — R339 Retention of urine, unspecified: Secondary | ICD-10-CM | POA: Diagnosis present

## 2013-02-03 DIAGNOSIS — N179 Acute kidney failure, unspecified: Secondary | ICD-10-CM

## 2013-02-03 DIAGNOSIS — M8580 Other specified disorders of bone density and structure, unspecified site: Secondary | ICD-10-CM | POA: Diagnosis present

## 2013-02-03 DIAGNOSIS — R634 Abnormal weight loss: Secondary | ICD-10-CM

## 2013-02-03 DIAGNOSIS — Z803 Family history of malignant neoplasm of breast: Secondary | ICD-10-CM

## 2013-02-03 DIAGNOSIS — Z8041 Family history of malignant neoplasm of ovary: Secondary | ICD-10-CM

## 2013-02-03 LAB — BASIC METABOLIC PANEL
BUN: 11 mg/dL (ref 6–23)
CO2: 23 mEq/L (ref 19–32)
Calcium: 8.3 mg/dL — ABNORMAL LOW (ref 8.4–10.5)
Chloride: 106 mEq/L (ref 96–112)
Creatinine, Ser: 0.7 mg/dL (ref 0.50–1.10)
GFR calc Af Amer: >90 mL/min (ref >90)
GFR calc non Af Amer: 78 mL/min — ABNORMAL LOW (ref >90)
Glucose, Bld: 104 mg/dL — ABNORMAL HIGH (ref 70–99)
Potassium: 3.8 mEq/L (ref 3.7–5.3)
Sodium: 141 mEq/L (ref 137–147)

## 2013-02-03 LAB — URINE MICROSCOPIC-ADD ON

## 2013-02-03 LAB — URIC ACID: Uric Acid, Serum: 3.4 mg/dL (ref 2.4–7.0)

## 2013-02-03 LAB — CBC WITH DIFFERENTIAL/PLATELET
Basophils Absolute: 0 10*3/uL (ref 0.0–0.1)
Basophils Relative: 0 % (ref 0–1)
Eosinophils Absolute: 0 10*3/uL (ref 0.0–0.7)
Eosinophils Relative: 0 % (ref 0–5)
HCT: 36 % (ref 36.0–46.0)
Hemoglobin: 12.2 g/dL (ref 12.0–15.0)
Lymphocytes Relative: 20 % (ref 12–46)
Lymphs Abs: 1.5 10*3/uL (ref 0.7–4.0)
MCH: 32.1 pg (ref 26.0–34.0)
MCHC: 33.9 g/dL (ref 30.0–36.0)
MCV: 94.7 fL (ref 78.0–100.0)
Monocytes Absolute: 0.6 10*3/uL (ref 0.1–1.0)
Monocytes Relative: 9 % (ref 3–12)
Neutro Abs: 5.3 10*3/uL (ref 1.7–7.7)
Neutrophils Relative %: 71 % (ref 43–77)
Platelets: 166 10*3/uL (ref 150–400)
RBC: 3.8 MIL/uL — ABNORMAL LOW (ref 3.87–5.11)
RDW: 13.2 % (ref 11.5–15.5)
WBC: 7.4 10*3/uL (ref 4.0–10.5)

## 2013-02-03 LAB — OSMOLALITY, URINE: Osmolality, Ur: 434 mOsm/kg (ref 390–1090)

## 2013-02-03 LAB — UREA NITROGEN, URINE: Urea Nitrogen, Ur: 481 mg/dL

## 2013-02-03 LAB — COMPREHENSIVE METABOLIC PANEL
ALT: 15 U/L (ref 0–35)
ALT: 13 U/L (ref 0–35)
AST: 14 U/L (ref 0–37)
AST: 20 U/L (ref 0–37)
Albumin: 3.6 g/dL (ref 3.5–5.2)
Albumin: 3.1 g/dL — ABNORMAL LOW (ref 3.5–5.2)
Alkaline Phosphatase: 97 U/L (ref 39–117)
Alkaline Phosphatase: 64 U/L (ref 39–117)
BUN: 99 mg/dL — ABNORMAL HIGH (ref 6–23)
BUN: 12 mg/dL (ref 6–23)
CO2: 20 mEq/L (ref 19–32)
CO2: 24 mEq/L (ref 19–32)
Calcium: 9.1 mg/dL (ref 8.4–10.5)
Calcium: 8.6 mg/dL (ref 8.4–10.5)
Chloride: 100 mEq/L (ref 96–112)
Chloride: 103 mEq/L (ref 96–112)
Creatinine, Ser: 16.4 mg/dL — ABNORMAL HIGH (ref 0.50–1.10)
Creatinine, Ser: 0.75 mg/dL (ref 0.50–1.10)
GFR calc Af Amer: 2 mL/min — ABNORMAL LOW (ref >90)
GFR calc Af Amer: 88 mL/min — ABNORMAL LOW (ref >90)
GFR calc non Af Amer: 2 mL/min — ABNORMAL LOW (ref >90)
GFR calc non Af Amer: 76 mL/min — ABNORMAL LOW (ref >90)
Glucose, Bld: 123 mg/dL — ABNORMAL HIGH (ref 70–99)
Glucose, Bld: 92 mg/dL (ref 70–99)
Potassium: 4.4 mEq/L (ref 3.7–5.3)
Potassium: 3.8 mEq/L (ref 3.7–5.3)
Sodium: 144 mEq/L (ref 137–147)
Sodium: 141 mEq/L (ref 137–147)
Total Bilirubin: 0.2 mg/dL — ABNORMAL LOW (ref 0.3–1.2)
Total Bilirubin: 0.6 mg/dL (ref 0.3–1.2)
Total Protein: 7.7 g/dL (ref 6.0–8.3)
Total Protein: 6.4 g/dL (ref 6.0–8.3)

## 2013-02-03 LAB — URINALYSIS, ROUTINE W REFLEX MICROSCOPIC
Bilirubin Urine: NEGATIVE
Glucose, UA: NEGATIVE mg/dL
Ketones, ur: NEGATIVE mg/dL
Leukocytes, UA: NEGATIVE
Nitrite: NEGATIVE
Protein, ur: NEGATIVE mg/dL
Specific Gravity, Urine: 1.014 (ref 1.005–1.030)
Urobilinogen, UA: 0.2 mg/dL (ref 0.0–1.0)
pH: 8 (ref 5.0–8.0)

## 2013-02-03 LAB — SODIUM, URINE, RANDOM: Sodium, Ur: 79 mEq/L

## 2013-02-03 LAB — LIPASE, BLOOD: Lipase: 24 U/L (ref 11–59)

## 2013-02-03 LAB — HEPATITIS B SURFACE ANTIGEN: Hepatitis B Surface Ag: NEGATIVE

## 2013-02-03 LAB — PHOSPHORUS: Phosphorus: 3 mg/dL (ref 2.3–4.6)

## 2013-02-03 LAB — HEPATITIS B SURFACE ANTIBODY,QUALITATIVE: Hep B S Ab: NEGATIVE

## 2013-02-03 LAB — CREATININE, URINE, RANDOM: Creatinine, Urine: 66.76 mg/dL

## 2013-02-03 LAB — HEPATITIS B CORE ANTIBODY, IGM: Hep B C IgM: NONREACTIVE

## 2013-02-03 MED ORDER — ASPIRIN 81 MG PO CHEW
81.0000 mg | CHEWABLE_TABLET | Freq: Every day | ORAL | Status: DC
  Administered 2013-02-04: 81 mg via ORAL
  Filled 2013-02-03: qty 1

## 2013-02-03 MED ORDER — SODIUM CHLORIDE 0.9 % IV SOLN
INTRAVENOUS | Status: DC
  Administered 2013-02-03: 125 mL via INTRAVENOUS

## 2013-02-03 MED ORDER — ONDANSETRON HCL 4 MG/2ML IJ SOLN
4.0000 mg | Freq: Four times a day (QID) | INTRAMUSCULAR | Status: DC | PRN
  Administered 2013-02-03 (×2): 4 mg via INTRAVENOUS
  Filled 2013-02-03 (×2): qty 2

## 2013-02-03 MED ORDER — MORPHINE SULFATE 4 MG/ML IJ SOLN
4.0000 mg | Freq: Once | INTRAMUSCULAR | Status: AC
  Administered 2013-02-03: 4 mg via INTRAVENOUS
  Filled 2013-02-03: qty 1

## 2013-02-03 MED ORDER — SODIUM CHLORIDE 0.9 % IV SOLN: INTRAVENOUS | Status: DC

## 2013-02-03 MED ORDER — HEPARIN SODIUM (PORCINE) 5000 UNIT/ML IJ SOLN
5000.0000 [IU] | Freq: Three times a day (TID) | INTRAMUSCULAR | Status: DC
  Administered 2013-02-03 (×2): 5000 [IU] via SUBCUTANEOUS
  Filled 2013-02-03 (×6): qty 1

## 2013-02-03 MED ORDER — ONDANSETRON HCL 4 MG/2ML IJ SOLN
4.0000 mg | Freq: Once | INTRAMUSCULAR | Status: AC
  Administered 2013-02-03: 4 mg via INTRAVENOUS
  Filled 2013-02-03: qty 2

## 2013-02-03 MED ORDER — DOCUSATE SODIUM 100 MG PO CAPS
100.0000 mg | ORAL_CAPSULE | Freq: Two times a day (BID) | ORAL | Status: DC
  Administered 2013-02-03 – 2013-02-04 (×2): 100 mg via ORAL
  Filled 2013-02-03 (×3): qty 1

## 2013-02-03 MED ORDER — ACETAMINOPHEN 650 MG RE SUPP: 650.0000 mg | Freq: Four times a day (QID) | RECTAL | Status: DC | PRN

## 2013-02-03 MED ORDER — ACETAMINOPHEN 325 MG PO TABS: 650.0000 mg | ORAL_TABLET | Freq: Four times a day (QID) | ORAL | Status: DC | PRN

## 2013-02-03 MED ORDER — NITROFURANTOIN MONOHYD MACRO 100 MG PO CAPS
100.0000 mg | ORAL_CAPSULE | Freq: Two times a day (BID) | ORAL | Status: DC
  Administered 2013-02-03: 100 mg via ORAL
  Filled 2013-02-03 (×4): qty 1

## 2013-02-03 MED ORDER — SODIUM CHLORIDE 0.9 % IJ SOLN
3.0000 mL | Freq: Two times a day (BID) | INTRAMUSCULAR | Status: DC
  Administered 2013-02-03 – 2013-02-04 (×2): 3 mL via INTRAVENOUS

## 2013-02-03 MED ORDER — FENTANYL CITRATE 0.05 MG/ML IJ SOLN
50.0000 ug | Freq: Once | INTRAMUSCULAR | Status: AC
  Administered 2013-02-03: 50 ug via INTRAVENOUS

## 2013-02-03 MED ORDER — ONDANSETRON HCL 4 MG PO TABS: 4.0000 mg | ORAL_TABLET | Freq: Four times a day (QID) | ORAL | Status: DC | PRN

## 2013-02-03 NOTE — ED Provider Notes (Signed)
I saw and evaluated the patient, reviewed the resident's note and I agree with the findings and plan.   Date: 02/03/2013 21:16   Rate: 54  Rhythm: Sinus bradycardia  QRS Axis: normal  Intervals: normal  ST/T Wave abnormalities: normal  Conduction Disutrbances: none  Narrative Interpretation: Sinus bradycardia, Q waves in anterior leads, no significant change compared to prior EKG   Pt is a 80 y.o. F with no significant past medical history who presents the emergency department with sudden onset right flank pain that started at 3 PM. She describes it as a sharp pain and it is intermittent. No aggravating or relieving factors. No radiation. She denies any fevers, chills. She is very nauseous. No vomiting or diarrhea. No dysuria or hematuria.  No back injury, numbness, weakness or incontinence. Patient does regularly self cath but was able to urinate in the emergency department without difficulty. On exam, patient is hemodynamically stable but intermittently appears very uncomfortable and writhing in bed. Her abdomen is completely soft and nontender to palpation. There are no lesions over the skin. Patient is nontender to palpation over her midline or over the paraspinal musculature. Concern for possible dissection versus ureterolithiasis. Patient's labs show new renal failure with creatinine of 16.4. Her electrolytes are within normal limits. She is no signs of volume overload on exam. She is not hypertensive. Urine does show trace hemoglobin but no other sign of infection. CT without contrast does not show any acute abnormality. Given patient is still having pain, will perform MRA to rule out dissection. Patient will need admission given her new acute renal failure. She is receiving IV hydration.    Signed out to Dr. Betsey Holiday who will followup on MRA and admit patient.      [place], DO 02/03/13 [phone]

## 2013-02-03 NOTE — Progress Notes (Signed)
Pt finished breakfast and had immediate emesis of undigested food approx 50cc, gingerale crackers given will continue to monitor

## 2013-02-03 NOTE — H&P (Addendum)
[facility] History and Physical  Patient: Julie Silva  [id number]  DOB: 04/26/1927  DOS: the patient was seen and examined on 02/03/2013 PCP: Pcp Not In System  Chief Complaint: right sided chest pain  HPI: Julie Silva is a 80 y.o. female with Past medical history of TAH BSO sacral colpopexy, 1995, Chronic urinary retention, managed x many years by self-cath, Recurrent cystitis, nitrofurantoin prophylaxis. The patient is coming from home. The pt is presenting with c/o right sided chest wall pain that has been ongoing since last night. She mentions that she was resting and all of a sudden started with this sudden pain on the right lower scapular region which was sharp and stabbing. The pain she mentions last only about 30 seconds and resolved on its own. She denies any shortness of breath, palpitation, dizziness, lightheadedness, recent surgery, immobilization, nausea vomiting abdominal pain, diarrhea, active bleeding, leg swelling, orthopnea, PND. She took some ibuprofen before she came to the hospital and that did not control her pain. Other than that she does not take ibuprofen on a regular basis. She has a colonoscopy 5 years ago which was normal, Pap smear 2012 which was normal, breast biopsy 2013 which was nonmalignant. Her father has history of hemophilia and stroke, she has 4 sister 1 has overweight in cancer with brain tumor, one has breast cancer, one has throat cancer her mother has ovarian cancer her brother has brain tumor which was thought to be metastatic and her daughter also has breast cancer. Her appetite is significantly low and the she has been losing weight which is unintentional. She has history of colpopexy 1995 and since then she has been self-catheterizing every night, she mentions she can't empty her bladder completely but that is a chronic complaint, she denies any recent change in her urinary habits, she denies any recent UTI.  Review of Systems: as mentioned  in the history of present illness.  A Comprehensive review of the other systems is negative.  History reviewed. No pertinent past medical history. History reviewed. No pertinent past surgical history. Social History:  reports that she has never smoked. She does not have any smokeless tobacco history on file. She reports that she does not drink alcohol. Her drug history is not on file. Independent for most of her  ADL.  Allergies  Allergen Reactions  . Codeine     No family history on file.  Prior to Admission medications   Medication Sig Start Date End Date Taking? Authorizing Provider  aspirin 81 MG tablet Take 81 mg by mouth daily.   Yes Historical Provider, MD  bimatoprost (LUMIGAN) 0.03 % ophthalmic solution Place 1 drop into both eyes at bedtime.   Yes Historical Provider, MD  calcium carbonate (TUMS - DOSED IN MG ELEMENTAL CALCIUM) 500 MG chewable tablet Chew 1 tablet by mouth daily.   Yes Historical Provider, MD  cholecalciferol (VITAMIN D) 1000 UNITS tablet Take 1,000 Units by mouth daily.   Yes Historical Provider, MD  estrogens, conjugated, (PREMARIN) 0.625 MG tablet Take 0.625 mg by mouth daily. 1/2 tablet   Yes Historical Provider, MD  Multiple Vitamins-Minerals (ICAPS) TABS Take 1 tablet by mouth daily.   Yes Historical Provider, MD  Multiple Vitamins-Minerals (MULTIVITAMIN WITH MINERALS) tablet Take 1 tablet by mouth daily.   Yes Historical Provider, MD  nitrofurantoin (MACRODANTIN) 100 MG capsule Take 100 mg by mouth 4 (four) times daily.   Yes Historical Provider, MD    Physical Exam: Filed Vitals:   02/03/13 3710  02/03/13 0545 02/03/13 0630 02/03/13 0637  BP: 128/48 113/69 123/45 123/45  Pulse: 69 68 65 68  Temp:      TempSrc:      Resp: 17 15 14 16   SpO2: 99% 99% 100% 98%    General: Alert, Awake and Oriented to Time, Place and Person. Appear in mild distress Eyes: PERRL ENT: Oral Mucosa clear moist. Neck: no JVD Cardiovascular: S1 and S2 Present, no  Murmur, Peripheral Pulses Present Respiratory: Bilateral Air entry equal and Decreased, Clear to Auscultation,  no Crackles,no wheezes Abdomen: Bowel Sound Present, Soft and Non tender Skin: no Rash Extremities: no Pedal edema, no calf tenderness Neurologic: Grossly Unremarkable.  Labs on Admission:  CBC:  Recent Labs Lab 02/02/13 2150  WBC 6.9  NEUTROABS 4.7  HGB 11.3*  HCT 33.4*  MCV 94.1  PLT 179    CMP     Component Value Date/Time   NA 144 02/02/2013 2150   K 4.4 02/02/2013 2150   CL 100 02/02/2013 2150   CO2 20 02/02/2013 2150   GLUCOSE 123* 02/02/2013 2150   BUN 99* 02/02/2013 2150   CREATININE 16.40* 02/02/2013 2150   CALCIUM 9.1 02/02/2013 2150   PROT 7.7 02/02/2013 2150   ALBUMIN 3.6 02/02/2013 2150   AST 14 02/02/2013 2150   ALT 15 02/02/2013 2150   ALKPHOS 97 02/02/2013 2150   BILITOT 0.2* 02/02/2013 2150   GFRNONAA 2* 02/02/2013 2150   GFRAA 2* 02/02/2013 2150     Recent Labs Lab 02/02/13 2150  LIPASE 24   No results found for this basename: AMMONIA,  in the last 168 hours  No results found for this basename: CKTOTAL, CKMB, CKMBINDEX, TROPONINI,  in the last 168 hours BNP (last 3 results) No results found for this basename: PROBNP,  in the last 8760 hours  Radiological Exams on Admission: Ct Abdomen Pelvis Wo Contrast  02/03/2013   ADDENDUM REPORT: 02/03/2013 00:26  ADDENDUM: There is a rounded calcification along the course of the right ureter at the level L4 vertebral body seen on coronal image 25 and axial image 36. This is felt most likely to represent a vascular calcification. There is no hydronephrosis on the right.   Electronically Signed   By: Suzy Bouchard M.D.   On: 02/03/2013 00:26   02/03/2013   CLINICAL DATA:  Right flank pain and nausea  EXAM: CT ABDOMEN AND PELVIS WITHOUT CONTRAST  TECHNIQUE: Multidetector CT imaging of the abdomen and pelvis was performed following the standard protocol without intravenous contrast.  COMPARISON:  MR MRCP dated  04/20/2009  FINDINGS: There is chronic appearing scarring and nodularity in the medial right lung base with mild bronchiectasis (image 8).  Non IV contrast images demonstrate no focal hepatic lesion. There is pneumobilia within the left hepatic lobe. Post cholecystectomy. The pancreas, spleen, adrenal glands are normal. No nephrolithiasis or ureterolithiasis. Low-density cyst extending from the anterior cortex of the left kidney.  Stomach, its small bowel, cecum normal. The appendix is not clearly identified however there are no secondary signs of appendicitis. Multiple diverticula through the sigmoid region without acute inflammation.  Abdominal aorta is normal caliber. No retroperitoneal periportal lymphadenopathy.  Post hysterectomy anatomy. The bladder normal. No bladder stones. There are multiple rounded vascular calcifications in the pelvis. No pelvic lymphadenopathy. No aggressive osseous lesion.  IMPRESSION: 1. No nephrolithiasis or ureterolithiasis. 2. Reason identified but no secondary signs of appendicitis. 3. Pneumobilia in the left hepatic lobe likely relates to prior sphincterotomy. 4. Sigmoid diverticulosis  without diverticulitis  Electronically Signed: By: Suzy Bouchard M.D. On: 02/02/2013 23:53   Mr [name] Nam Chest Wo Contrast  02/03/2013   CLINICAL DATA:  Evaluate for aortic dissection. Flank pain and renal insufficiency.  EXAM: MRA CHEST WITHOUT CONTRAST  TECHNIQUE: Angiographic images of the chest were obtained using MRA technique without and with intravenous contrast.  CONTRAST:  None.  COMPARISON:  MRI of the abdomen 04/20/2009  FINDINGS: Using multiple techniques, angiographic and anatomic imaging of the thoracic and abdominal aorta was performed. All the sequences not include the distal abdominal aorta, although it is seen on [employer] imaging. There is no aortic aneurysm. There is no evidence of aortic dissection or intramural hematoma.  Relative to the history of renal insufficiency, there is no  hydronephrosis. There are bilateral renal cysts, as noted on preceding abdominal CT.  Cholecystectomy with mild biliary and pancreatic ductal enlargement.  There is a cluster of nodularity in the posterior left lung. Opacification and volume loss in the right middle lobe, consistent with atelectasis when compared to previous CT. Although there appears to be a 28mm nodule in the right lower lung on T1 black blood imaging, the nodule is not confirmed on additional imaging.  IMPRESSION: 1. No thoracic or abdominal aortic aneurysm. No aortic dissection.  2. Mild airspace disease in the left mid lung. Correlate for symptoms of pneumonia.   Electronically Signed   By: Jorje Guild M.D.   On: 02/03/2013 03:21    EKG: Independently reviewed. normal sinus rhythm, nonspecific ST and T waves changes.  Assessment/Plan Principal Problem:   AKI (acute kidney injury) Active Problems:   Urinary retention   Osteopenia   Weight loss   Recurrent cystitis   Family history of breast cancer   Family history of ovarian cancer   Family history of hemophilia   Right-sided chest wall pain   1. Right sided chest pain The pt has presented with chest pain, extensive work up including CT abdomen and pelvis, MRA. EKG, troponin has been negative. She is currently chest pain free. Possible has pain is due to pleurisy vs atlectesis vs chronic bronchiectasis.  She does not have any symptoms of infectious etiology. At present i will monitor her on tele and obtain serial troponin and provide her incentive spirometry. There was initial thought of possible aortic dissection but MRI and CT scan has ruled out that possibility.  2.AKI She has been presented with AKI, although she does not have any sign of volume overload or electrolyte imbalance which suggest this has been ongoing since long and recently acutely discovered. Her labwork 2 years ago was normal. She does not have signs of dehydration and her blood pressure is  stable thus less likely as a prerenal etiology. She has a history of bladder outflow obstruction and has been self-catheterizing but she does not have any hydronephrosis to suggest significant renal damage from obstructive uropathy. She does not appear to have any signs of infection or recent medication changes because acute kidney injury. Has taken ibuprofen only today when she stated with the pain. [facility] consult nephrology for further input and workup per nephrology  3. Osteopenia Continue calcium  Consults: nephrology  DVT Prophylaxis: subcutaneous Heparin Nutrition: renal diet  Code Status: full  Family Communication: family was present at bedside, opportunity was given to ask question and all questions were answered satisfactorily at the time of interview. Disposition: Admitted to inpatient in telemetry unit.  Author: Berle Mull, MD [facility] Pager: [phone] 02/03/2013, 7:03 AM  If 7PM-7AM, please contact night-coverage [email or web address] Password [email or web address]

## 2013-02-03 NOTE — Progress Notes (Signed)
VASCULAR LAB PRELIMINARY  PRELIMINARY  PRELIMINARY  PRELIMINARY  Bilateral lower extremity venous Dopplers completed.    Preliminary report:  There is no DVT or SVT noted in the bilateral lower extremities.  Arta Stump, RVT 02/03/2013, 11:35 AM

## 2013-02-03 NOTE — Consult Note (Signed)
Renal Service Consult Note [facility]  Nathania Farquhar 02/03/2013 Roney Jaffe D Requesting Physician:  Dr Posey Pronto  Reason for Consult:  Renal failure, new onset HPI: The patient is a 80 y.o. year-old who presented to ED last night 24-48hrs of R flank pain. Some nausea associated.  Lab work showed creatinine of 13.  CT abd showed normal sized kidneys with no stone or hydro.  MRA abd and chest was done overnight to exclude dissecting aneuyrsym given back pain and these studies showed no evidence of aortic aneurysm or dissection.  Kidneys were normal appearance on MRA also except for simple cysts.  The patient then voided and had a post-void bladder scan + for 450 cc fluid and a foley was placed. Overnight pt voided 600 cc total of urine. No hematuria or dysuria.   Patient denies hx of renal failure. Takes occ NSAID's, not a lot.  She has bladder prolapse surgery about 15 yrs ago at [facility]. Subsequent to the procedure she had problems completely emptying her bladder and had to go on a self-cath regimen which she has been doing ever since.  The daughter says the surgeon told them that they thought they pulled the bladder up "too high" and that was why she couldn't void well.      ROS  no cp  no ha  no fevers   recurrent UTI's, on lowdose macrodantin for many years as prophylaxis  no leg edema    no abd pain  no jt pain   no mouth sores or hair loss, no skin rash  Past Medical History History reviewed. No pertinent past medical history. Past Surgical History  Past Surgical History  Procedure Laterality Date  . Bladder surgery     Family History History reviewed. No pertinent family history. Social History  reports that she has never smoked. She does not have any smokeless tobacco history on file. She reports that she does not drink alcohol. Her drug history is not on file. Allergies  Allergies  Allergen Reactions  . Codeine    Home medications Prior to Admission  medications   Medication Sig Start Date End Date Taking? Authorizing Provider  aspirin 81 MG tablet Take 81 mg by mouth daily.   Yes Historical Provider, MD  bimatoprost (LUMIGAN) 0.03 % ophthalmic solution Place 1 drop into both eyes at bedtime.   Yes Historical Provider, MD  calcium carbonate (TUMS - DOSED IN MG ELEMENTAL CALCIUM) 500 MG chewable tablet Chew 1 tablet by mouth daily.   Yes Historical Provider, MD  cholecalciferol (VITAMIN D) 1000 UNITS tablet Take 1,000 Units by mouth daily.   Yes Historical Provider, MD  estrogens, conjugated, (PREMARIN) 0.625 MG tablet Take 0.625 mg by mouth daily. 1/2 tablet   Yes Historical Provider, MD  Multiple Vitamins-Minerals (ICAPS) TABS Take 1 tablet by mouth daily.   Yes Historical Provider, MD  Multiple Vitamins-Minerals (MULTIVITAMIN WITH MINERALS) tablet Take 1 tablet by mouth daily.   Yes Historical Provider, MD  nitrofurantoin (MACRODANTIN) 100 MG capsule Take 100 mg by mouth 4 (four) times daily.   Yes Historical Provider, MD   Liver Function Tests  Recent Labs Lab 02/02/13 2150  AST 14  ALT 15  ALKPHOS 97  BILITOT 0.2*  PROT 7.7  ALBUMIN 3.6    Recent Labs Lab 02/02/13 2150  LIPASE 24   CBC  Recent Labs Lab 02/02/13 2150 02/03/13 0900  WBC 6.9 7.4  NEUTROABS 4.7 5.3  HGB 11.3* 12.2  HCT 33.4* 36.0  MCV 94.1 94.7  PLT 179 938   Basic Metabolic Panel  Recent Labs Lab 02/02/13 2150  NA 144  K 4.4  CL 100  CO2 20  GLUCOSE 123*  BUN 99*  CREATININE 16.40*  CALCIUM 9.1    Exam  Blood pressure 126/47, pulse 57, temperature 98.3 F (36.8 C), temperature source Oral, resp. rate 15, height 5\' 4"  (1.626 m), weight 44.815 kg (98 lb 12.8 oz), SpO2 100.00%. Elderly thin pleasant WF in no distress, calm No rash, cyanosis or gangrene Sclera anicteric, throat clear Flat neck veins, no jvd, no bruits Chest is clear bilat RRR no MRG Abd soft, nt, nd, no ascites, +soft bruit mid abd GU foley in place draining clear  yellow urine No LE or UE edema, pulses in feet R > L, no fem bruits Neuro alert, no asterixis  UA- no prot, 0-2wbc, 3-6 rbc, rare bact UNa 79, Ucreat 67 CT abd last night normal appearing kidneys, no hydro or stones MRA abdomen, r/o diss aneursym >> no thoracic or abd aneurysm, mild L lung AS disease, ? PNA, no hydro, bilat renal cysts, renal size is approx symmetric     Assessment/Plan:  1. New onset renal failure- unclear cause, no labs by PCP for a few years. Kidneys are normal in appearance on MR and CT. She is a little dry on exam.  UA looks benign, not suspicious for acute GN. Making urine this morning, will start IVF"s, get a few serologies and complements, get myeloma studies, check creat daily, get records from PCP on Monday w regards to any old creatinines. No grossly uremic at this time, no need for HD at this time.  No nsaid's or acei/arb. No dye.   2. Hx urinary retention after bladder surgery- foley in place    Kelly Splinter MD (pgr) [phone]    ([phone] 02/03/2013, 10:43 AM

## 2013-02-03 NOTE — Progress Notes (Signed)
RN notified Dr. Candiss Norse of patient updated BUN/Creatinine results.  Orders received.  Rn will continue to monitor patient.

## 2013-02-03 NOTE — Progress Notes (Signed)
RN notified Dr.Schertz of patient BUN/Creatinine. RN will continue to monitor.

## 2013-02-03 NOTE — ED Notes (Signed)
Pt remains in MRI at this time  

## 2013-02-03 NOTE — Progress Notes (Signed)
Patient ambulated approximately 350 with standby assist.  Patient tolerated well.  RN will continue to monitor

## 2013-02-03 NOTE — ED Provider Notes (Signed)
Patient signed out to me pending MRI to rule out aortic dissection. Patient seen initially for the flank and back pain, found to be in renal failure. Based on the patient's pain and concomitant new diagnosis of renal failure, dissection was considered an entity that could cause both symptoms. CT scan could not be performed because of renal failure. MRI was therefore performed that did not show any evidence of dissection.  The patient will require hospitalization for further management of significant renal failure, creatinine above 16. Patient admitted to general medicine for further management.  Orpah Greek, MD 02/05/13 [phone]

## 2013-02-03 NOTE — Progress Notes (Signed)
Patient Demographics  Julie Silva, is a 80 y.o. female, DOB - January 06, 1927, XHB:[id number]  Admit date - 02/02/2013   Admitting Physician Berle Mull, MD  Outpatient Primary MD for the patient is Pcp Not In System  LOS - 1   Chief Complaint  Patient presents with  . Back Pain        Assessment & Plan    1.Renal Failure - ? Acute, last creatinine from 4 years ago in the system is under 1, she does have chronic bladder issues and self caths herself 4-5 times a day, CT scan of the abdomen and pelvis does not show any evidence of hydronephrosis or ureteric obstruction. Will continue gentle IV fluids, foley, avoid nephrotoxins, renal to see later. Monitor BMP. Currently no signs of uremia or need for urgent dialysis.    2.Osteopenia - resume home supplements post DC.    3.Chr R. Flank pain for yrs - stable, MRA chest and CT Abd  stable.     Code Status: full  Family Communication:  daughter  Disposition Plan:  Home     Procedures MRA Chest, CT Abd-Pelvis   Consults  Renal   Medications  Scheduled Meds: . aspirin  81 mg Oral Daily  . heparin  5,000 Units Subcutaneous Q8H  . sodium chloride  3 mL Intravenous Q12H   Continuous Infusions: . sodium chloride     PRN Meds:.acetaminophen, ondansetron (ZOFRAN) IV  DVT Prophylaxis   Heparin    Lab Results  Component Value Date   PLT 166 02/03/2013    Antibiotics     Anti-infectives   None          Subjective:   [name] today has, No headache, No chest pain, No abdominal pain - No Nausea, No new weakness tingling or numbness, No Cough - SOB. Mild R flank pain, better  Objective:   Filed Vitals:   02/03/13 0637 02/03/13 0700 02/03/13 0745 02/03/13 0825  BP: 123/45 112/59 123/41 126/47  Pulse: 68 61 58 57  Temp:     98.3 F (36.8 C)  TempSrc:    Oral  Resp: 16 20 13 15   Height:    5\' 4"  (1.626 m)  Weight:    44.815 kg (98 lb 12.8 oz)  SpO2: 98% 100% 100% 100%    Wt Readings from Last 3 Encounters:  02/03/13 44.815 kg (98 lb 12.8 oz)     Intake/Output Summary (Last 24 hours) at 02/03/13 1102 Last data filed at 02/03/13 0948  Gross per 24 hour  Intake    240 ml  Output    600 ml  Net   -360 ml    Exam Awake Alert, Oriented X 3, No new F.N deficits, Normal affect [place].AT,PERRAL Supple Neck,No JVD, No cervical lymphadenopathy appriciated.  Symmetrical Chest wall movement, Good air movement bilaterally, CTAB RRR,No Gallops,Rubs or new Murmurs, No Parasternal Heave +ve B.Sounds, Abd Soft, Non tender, No organomegaly appriciated, No rebound - guarding or rigidity. No Cyanosis, Clubbing , mild edema, No new Rash or bruise     Data Review   Micro Results No results found for this or any previous visit (from the past 240 hour(s)).  Radiology Reports Ct Abdomen Pelvis Wo Contrast  02/03/2013  ADDENDUM REPORT: 02/03/2013 00:26  ADDENDUM: There is a rounded calcification along the course of the right ureter at the level L4 vertebral body seen on coronal image 25 and axial image 36. This is felt most likely to represent a vascular calcification. There is no hydronephrosis on the right.   Electronically Signed   By: Suzy Bouchard M.D.   On: 02/03/2013 00:26   02/03/2013   CLINICAL DATA:  Right flank pain and nausea  EXAM: CT ABDOMEN AND PELVIS WITHOUT CONTRAST  TECHNIQUE: Multidetector CT imaging of the abdomen and pelvis was performed following the standard protocol without intravenous contrast.  COMPARISON:  MR MRCP dated 04/20/2009  FINDINGS: There is chronic appearing scarring and nodularity in the medial right lung base with mild bronchiectasis (image 8).  Non IV contrast images demonstrate no focal hepatic lesion. There is pneumobilia within the left hepatic lobe. Post cholecystectomy. The  pancreas, spleen, adrenal glands are normal. No nephrolithiasis or ureterolithiasis. Low-density cyst extending from the anterior cortex of the left kidney.  Stomach, its small bowel, cecum normal. The appendix is not clearly identified however there are no secondary signs of appendicitis. Multiple diverticula through the sigmoid region without acute inflammation.  Abdominal aorta is normal caliber. No retroperitoneal periportal lymphadenopathy.  Post hysterectomy anatomy. The bladder normal. No bladder stones. There are multiple rounded vascular calcifications in the pelvis. No pelvic lymphadenopathy. No aggressive osseous lesion.  IMPRESSION: 1. No nephrolithiasis or ureterolithiasis. 2. Reason identified but no secondary signs of appendicitis. 3. Pneumobilia in the left hepatic lobe likely relates to prior sphincterotomy. 4. Sigmoid diverticulosis without diverticulitis  Electronically Signed: By: Suzy Bouchard M.D. On: 02/02/2013 23:53     Mr [name] Nam Chest Wo Contrast  02/03/2013   CLINICAL DATA:  Evaluate for aortic dissection. Flank pain and renal insufficiency.  EXAM: MRA CHEST WITHOUT CONTRAST  TECHNIQUE: Angiographic images of the chest were obtained using MRA technique without  contrast.  CONTRAST:  None.  COMPARISON:  MRI of the abdomen 04/20/2009  FINDINGS: Using multiple techniques, angiographic and anatomic imaging of the thoracic and abdominal aorta was performed. All the sequences not include the distal abdominal aorta, although it is seen on Fiesta imaging. There is no aortic aneurysm. There is no evidence of aortic dissection or intramural hematoma.  Relative to the history of renal insufficiency, there is no hydronephrosis. There are bilateral renal cysts, as noted on preceding abdominal CT.  Cholecystectomy with mild biliary and pancreatic ductal enlargement.  There is a cluster of nodularity in the posterior left lung. Opacification and volume loss in the right middle lobe, consistent with  atelectasis when compared to previous CT. Although there appears to be a 61mm nodule in the right lower lung on T1 black blood imaging, the nodule is not confirmed on additional imaging.  IMPRESSION: 1. No thoracic or abdominal aortic aneurysm. No aortic dissection.  2. Mild airspace disease in the left mid lung. Correlate for symptoms of pneumonia.   Electronically Signed   By: Jorje Guild M.D.   On: 02/03/2013 03:21    CBC  Recent Labs Lab 02/02/13 2150 02/03/13 0900  WBC 6.9 7.4  HGB 11.3* 12.2  HCT 33.4* 36.0  PLT 179 166  MCV 94.1 94.7  MCH 31.8 32.1  MCHC 33.8 33.9  RDW 13.0 13.2  LYMPHSABS 1.4 1.5  MONOABS 0.7 0.6  EOSABS 0.1 0.0  BASOSABS 0.0 0.0    Chemistries   Recent Labs Lab 02/02/13 2150  NA 144  K 4.4  CL 100  CO2 20  GLUCOSE 123*  BUN 99*  CREATININE 16.40*  CALCIUM 9.1  AST 14  ALT 15  ALKPHOS 97  BILITOT 0.2*   ------------------------------------------------------------------------------------------------------------------ estimated creatinine clearance is 1.8 ml/min (by C-G formula based on Cr of 16.4). ------------------------------------------------------------------------------------------------------------------ No results found for this basename: HGBA1C,  in the last 72 hours ------------------------------------------------------------------------------------------------------------------ No results found for this basename: CHOL, HDL, LDLCALC, TRIG, CHOLHDL, LDLDIRECT,  in the last 72 hours ------------------------------------------------------------------------------------------------------------------ No results found for this basename: TSH, T4TOTAL, FREET3, T3FREE, THYROIDAB,  in the last 72 hours ------------------------------------------------------------------------------------------------------------------ No results found for this basename: VITAMINB12, FOLATE, FERRITIN, TIBC, IRON, RETICCTPCT,  in the last 72 hours  Coagulation  profile No results found for this basename: INR, PROTIME,  in the last 168 hours  No results found for this basename: DDIMER,  in the last 72 hours  Cardiac Enzymes No results found for this basename: CK, CKMB, TROPONINI, MYOGLOBIN,  in the last 168 hours ------------------------------------------------------------------------------------------------------------------ No components found with this basename: POCBNP,      Time Spent in minutes   35   SINGH,PRASHANT K M.D on 02/03/2013 at 11:02 AM  Between 7am to 7pm - Pager - [phone]  After 7pm go to [email or web address] - password [email or web address]  And look for the night coverage person covering for me after hours  [facility] Office  [phone]

## 2013-02-03 NOTE — ED Notes (Signed)
Bladder scan complete pt has 425 ml in bladder

## 2013-02-03 NOTE — Progress Notes (Signed)
Chemistries repeated twice today and creat is less than 1.0.  It appears that the blood from last night in the ED was not this patient's blood.  She does not have renal failure, have spoken with daughter and reassured her of this.  Will sign off.   Kelly Splinter MD (pgr) [phone]    ([phone] 02/03/2013, 3:52 PM

## 2013-02-04 ENCOUNTER — Encounter (HOSPITAL_COMMUNITY): Payer: Self-pay | Admitting: General Practice

## 2013-02-04 DIAGNOSIS — R109 Unspecified abdominal pain: Secondary | ICD-10-CM

## 2013-02-04 LAB — BASIC METABOLIC PANEL
BUN: 12 mg/dL (ref 6–23)
CO2: 25 mEq/L (ref 19–32)
Calcium: 8 mg/dL — ABNORMAL LOW (ref 8.4–10.5)
Chloride: 104 mEq/L (ref 96–112)
Creatinine, Ser: 0.8 mg/dL (ref 0.50–1.10)
GFR calc Af Amer: 77 mL/min — ABNORMAL LOW (ref >90)
GFR calc non Af Amer: 66 mL/min — ABNORMAL LOW (ref >90)
Glucose, Bld: 82 mg/dL (ref 70–99)
Potassium: 3.4 mEq/L — ABNORMAL LOW (ref 3.7–5.3)
Sodium: 140 mEq/L (ref 137–147)

## 2013-02-04 LAB — KAPPA/LAMBDA LIGHT CHAINS
Kappa free light chain: 1.95 mg/dL — ABNORMAL HIGH (ref 0.33–1.94)
Kappa, lambda light chain ratio: 1.04 (ref 0.26–1.65)
Lambda free light chains: 1.88 mg/dL (ref 0.57–2.63)

## 2013-02-04 LAB — URINE CULTURE
Colony Count: NO GROWTH
Culture: NO GROWTH

## 2013-02-04 LAB — MPO/PR-3 (ANCA) ANTIBODIES
Myeloperoxidase Abs: <1
Serine Protease 3: <1

## 2013-02-04 MED ORDER — POTASSIUM CHLORIDE CRYS ER 20 MEQ PO TBCR
40.0000 meq | EXTENDED_RELEASE_TABLET | Freq: Once | ORAL | Status: AC
  Administered 2013-02-04: 40 meq via ORAL
  Filled 2013-02-04: qty 2

## 2013-02-04 NOTE — Plan of Care (Addendum)
Problem: Food- and Nutrition-Related Knowledge Deficit (NB-1.1) Goal: Nutrition education Formal process to instruct or train a patient/client in a skill or to impart knowledge to help patients/clients voluntarily manage or modify food choices and eating behavior to maintain or improve health. Outcome: Completed/Met Date Met:  02/04/13  RD consulted for education regarding High-Calorie, High-Protein nutrition therapy. Pt about to be discharge - RD unable to complete full nutrition assessment at this time.  RD provided "High-Calorie High-Protein Nutrition Therapy" handout from the Academy of [employer]etics. Reviewed patient's dietary recall. Provided examples on ways to increase caloric density of foods and beverages frequently consumed by the patient. Also provided ideas to promote variety and to incorporate additional nutrient dense foods into patient's diet. Discussed eating small frequent meals and snacks to assist in increasing overall po intake. Teach back method used.  Expect good compliance.  Body mass index is 16.49 kg/(m^2). Pt meets criteria for Underweight based on current BMI.  Current diet order is Low Sodium, patient is consuming approximately 25-100% of meals at this time. Labs and medications reviewed. No further nutrition interventions warranted at this time. RD contact information provided. If additional nutrition issues arise, please re-consult RD.   Inda Coke MS, RD, LDN Pager: [phone] After-hours pager: [phone]

## 2013-02-04 NOTE — Discharge Instructions (Signed)
Call Miss Autumn Patty [phone] in 2 days.   Follow with Primary MD  in 7 days   Get CBC, CMP, checked 7 days by Primary MD and again as instructed by your Primary MD.    Activity: As tolerated with Full fall precautions use walker/cane & assistance as needed   Disposition Home     Diet: Heart Healthy    For Heart failure patients - Check your Weight same time everyday, if you gain over 2 pounds, or you develop in leg swelling, experience more shortness of breath or chest pain, call your Primary MD immediately. Follow Cardiac Low Salt Diet and 1.8 lit/day fluid restriction.   On your next visit with her primary care physician please Get Medicines reviewed and adjusted.  Please request your Prim.MD to go over all Hospital Tests and Procedure/Radiological results at the follow up, please get all Hospital records sent to your Prim MD by signing hospital release before you go home.   If you experience worsening of your admission symptoms, develop shortness of breath, life threatening emergency, suicidal or homicidal thoughts you must seek medical attention immediately by calling 911 or calling your MD immediately  if symptoms less severe.  You Must read complete instructions/literature along with all the possible adverse reactions/side effects for all the Medicines you take and that have been prescribed to you. Take any new Medicines after you have completely understood and accpet all the possible adverse reactions/side effects.   Do not drive and provide baby sitting services if your were admitted for syncope or siezures until you have seen by Primary MD or a Neurologist and advised to do so again.  Do not drive when taking Pain medications.    Do not take more than prescribed Pain, Sleep and Anxiety Medications  Special Instructions: If you have smoked or chewed Tobacco  in the last 2 yrs please stop smoking, stop any regular Alcohol  and or any Recreational drug use.  Wear  Seat belts while driving.   Please note  You were cared for by a hospitalist during your hospital stay. If you have any questions about your discharge medications or the care you received while you were in the hospital after you are discharged, you can call the unit and asked to speak with the hospitalist on call if the hospitalist that took care of you is not available. Once you are discharged, your primary care physician will handle any further medical issues. Please note that NO REFILLS for any discharge medications will be authorized once you are discharged, as it is imperative that you return to your primary care physician (or establish a relationship with a primary care physician if you do not have one) for your aftercare needs so that they can reassess your need for medications and monitor your lab values.

## 2013-02-04 NOTE — Discharge Summary (Signed)
Julie Silva, is a 80 y.o. female  DOB 06/16/1927  MRN [id number].  Admission date:  02/02/2013  Admitting Physician  Berle Mull, MD  Discharge Date:  02/04/2013   Primary MD  Pcp Not In System  Recommendations for primary care physician for things to follow:   Follow clinically, repeat 2 view chest x-ray in 2 weeks. Renal ultrasound in 3-6 months.   Admission Diagnosis  Renal failure, acute [584.9] Right flank pain [789.09]  Discharge Diagnosis  right-sided flank pain unclear etiology chronic discomfort for several years  Principal Problem:   AKI (acute kidney injury) Active Problems:   Urinary retention   Osteopenia   Weight loss   Recurrent cystitis   Family history of breast cancer   Family history of ovarian cancer   Family history of hemophilia   Right-sided chest wall pain      History reviewed. No pertinent past medical history.  Past Surgical History  Procedure Laterality Date  . Bladder surgery       Discharge Condition: Stable    Follow UP      Follow-up Information   Follow up with Apolonio Schneiders, MD. Schedule an appointment as soon as possible for a visit in 1 week.   Specialty:  Specialist      Follow up with Gwendolyn Grant, MD. Schedule an appointment as soon as possible for a visit in 1 week.   Specialty:  Internal Medicine   Contact information:   [address] [place] [address] [phone]         Consults obtained - renal   Discharge Medications      Medication List         aspirin EC 81 MG tablet  Take 81 mg by mouth daily.     bimatoprost 0.01 % Soln  Commonly known as:  LUMIGAN  Place 1 drop into both eyes at bedtime.     cholecalciferol 1000 UNITS tablet  Commonly known as:  VITAMIN D  Take 1,000 Units by mouth daily with  lunch.     estrogens (conjugated) 0.625 MG tablet  Commonly known as:  PREMARIN  Take 0.3125 mg by mouth daily with supper.     ibuprofen 200 MG tablet  Commonly known as:  ADVIL,MOTRIN  Take 200-400 mg by mouth every 4 (four) hours as needed (pain).     multivitamin with minerals Tabs tablet  Take 1 tablet by mouth daily with lunch. Centrum     nitrofurantoin (macrocrystal-monohydrate) 100 MG capsule  Commonly known as:  MACROBID  Take 100 mg by mouth at bedtime. Continuous course     PRESERVISION AREDS Caps  Take 1 capsule by mouth 2 (two) times daily.     TUMS PO  Take 1 tablet by mouth 3 (three) times daily.         Diet and Activity recommendation: See Discharge Instructions below   Discharge Instructions     Call Miss Autumn Patty [phone] in 2 days.  Follow with Primary MD  in 7 days   Get CBC, CMP, checked 7 days by Primary MD and again as instructed by your Primary MD.    Activity: As tolerated with Full fall precautions use walker/cane & assistance as needed   Disposition Home     Diet: Heart Healthy    For Heart failure patients - Check your Weight same time everyday, if you gain over 2 pounds, or you develop in leg swelling, experience more shortness of breath or chest pain, call your Primary MD immediately. Follow Cardiac Low Salt Diet and 1.8 lit/day fluid restriction.   On your next visit with her primary care physician please Get Medicines reviewed and adjusted.  Please request your Prim.MD to go over all Hospital Tests and Procedure/Radiological results at the follow up, please get all Hospital records sent to your Prim MD by signing hospital release before you go home.   If you experience worsening of your admission symptoms, develop shortness of breath, life threatening emergency, suicidal or homicidal thoughts you must seek medical attention immediately by calling 911 or calling your MD immediately  if symptoms less severe.  You  Must read complete instructions/literature along with all the possible adverse reactions/side effects for all the Medicines you take and that have been prescribed to you. Take any new Medicines after you have completely understood and accpet all the possible adverse reactions/side effects.   Do not drive and provide baby sitting services if your were admitted for syncope or siezures until you have seen by Primary MD or a Neurologist and advised to do so again.  Do not drive when taking Pain medications.    Do not take more than prescribed Pain, Sleep and Anxiety Medications  Special Instructions: If you have smoked or chewed Tobacco  in the last 2 yrs please stop smoking, stop any regular Alcohol  and or any Recreational drug use.  Wear Seat belts while driving.   Please note  You were cared for by a hospitalist during your hospital stay. If you have any questions about your discharge medications or the care you received while you were in the hospital after you are discharged, you can call the unit and asked to speak with the hospitalist on call if the hospitalist that took care of you is not available. Once you are discharged, your primary care physician will handle any further medical issues. Please note that NO REFILLS for any discharge medications will be authorized once you are discharged, as it is imperative that you return to your primary care physician (or establish a relationship with a primary care physician if you do not have one) for your aftercare needs so that they can reassess your need for medications and monitor your lab values.      Major procedures and Radiology Reports - PLEASE review detailed and final reports for all details, in brief -       Ct Abdomen Pelvis Wo Contrast  02/03/2013   ADDENDUM REPORT: 02/03/2013 00:26  ADDENDUM: There is a rounded calcification along the course of the right ureter at the level L4 vertebral body seen on coronal image 25 and axial  image 36. This is felt most likely to represent a vascular calcification. There is no hydronephrosis on the right.   Electronically Signed   By: Suzy Bouchard M.D.   On: 02/03/2013 00:26   02/03/2013   CLINICAL DATA:  Right flank pain and nausea  EXAM: CT ABDOMEN AND PELVIS WITHOUT CONTRAST  TECHNIQUE: Multidetector CT imaging of the abdomen and pelvis was performed following the standard protocol without intravenous contrast.  COMPARISON:  MR MRCP dated 04/20/2009  FINDINGS: There is chronic appearing scarring and nodularity in the medial right lung base with mild bronchiectasis (image 8).  Non IV contrast images demonstrate no focal hepatic lesion. There is pneumobilia within the left hepatic lobe. Post cholecystectomy. The pancreas, spleen, adrenal glands are normal. No nephrolithiasis or ureterolithiasis. Low-density cyst extending from the anterior cortex of the left kidney.  Stomach, its small bowel, cecum normal. The appendix is not clearly identified however there are no secondary signs of appendicitis. Multiple diverticula through the sigmoid region without acute inflammation.  Abdominal aorta is normal caliber. No retroperitoneal periportal lymphadenopathy.  Post hysterectomy anatomy. The bladder normal. No bladder stones. There are multiple rounded vascular calcifications in the pelvis. No pelvic lymphadenopathy. No aggressive osseous lesion.  IMPRESSION: 1. No nephrolithiasis or ureterolithiasis. 2. Reason identified but no secondary signs of appendicitis. 3. Pneumobilia in the left hepatic lobe likely relates to prior sphincterotomy. 4. Sigmoid diverticulosis without diverticulitis  Electronically Signed: By: Suzy Bouchard M.D. On: 02/02/2013 23:53   Mr [name] Nam Chest Wo Contrast  02/03/2013   CLINICAL DATA:  Evaluate for aortic dissection. Flank pain and renal insufficiency.  EXAM: MRA CHEST WITHOUT CONTRAST  TECHNIQUE: Angiographic images of the chest were obtained using MRA technique without and  with intravenous contrast.  CONTRAST:  None.  COMPARISON:  MRI of the abdomen 04/20/2009  FINDINGS: Using multiple techniques, angiographic and anatomic imaging of the thoracic and abdominal aorta was performed. All the sequences not include the distal abdominal aorta, although it is seen on Fiesta imaging. There is no aortic aneurysm. There is no evidence of aortic dissection or intramural hematoma.  Relative to the history of renal insufficiency, there is no hydronephrosis. There are bilateral renal cysts, as noted on preceding abdominal CT.  Cholecystectomy with mild biliary and pancreatic ductal enlargement.  There is a cluster of nodularity in the posterior left lung. Opacification and volume loss in the right middle lobe, consistent with atelectasis when compared to previous CT. Although there appears to be a 79mm nodule in the right lower lung on T1 black blood imaging, the nodule is not confirmed on additional imaging.  IMPRESSION: 1. No thoracic or abdominal aortic aneurysm. No aortic dissection.  2. Mild airspace disease in the left mid lung. Correlate for symptoms of pneumonia.   Electronically Signed   By: Jorje Guild M.D.   On: 02/03/2013 03:21   Mr [name] Nam Abdomen Wo Contrast  02/03/2013   CLINICAL DATA:  Kidney insufficiency, back pain.  EXAM: MRA ABDOMEN AND PELVIS WITH CONTRAST  TECHNIQUE: Multiplanar, multiecho pulse sequences of the abdomen and pelvis were obtained with intravenous contrast. Angiographic images of abdomen and pelvis were obtained using MRA technique with intravenous contrast.  CONTRAST:  None  COMPARISON:  CT 02/02/2013 and earlier studies  FINDINGS: Arterial findings:  Aorta: Normal in caliber and contour. No evidence of dissection, aneurysm, or stenosis.  Celiac axis:         Patent proximally  Superior mesenteric: Patent proximally  Left renal:          Limited assessment  Right renal:         Limited assessment  Inferior mesenteric: Not visualized  Left iliac:          Not  included  Right iliac:         Not included  Venous  findings:     Venous phase imaging not obtained.  Review of the MIP images confirms the above findings.  Nonvascular findings: Unremarkable limited assessment of liver, spleen, adrenal glands, pancreas. 13 mm left lower pole and 12 mm right upper pole fluid signal lesions probably cysts. No hydronephrosis.  IMPRESSION: 1. No evidence of abdominal aortic dissection or aneurysm. 2. Probable renal cysts.  No hydronephrosis.   Electronically Signed   By: Oley Balm M.D.   On: 02/03/2013 14:18    Micro Results      No results found for this or any previous visit (from the past 240 hour(s)).   History of present illness and  Hospital Course:     Kindly see H&P for history of present illness and admission details, please review complete Labs, Consult reports and Test reports for all details in brief Julie Silva, is a 80 y.o. female, patient with history of  osteopenia, chronic right-sided flank/back pain which has been present for the last several years, bladder problems requiring intermittent self-catheterization.     Was admitted to the hospital with mild worsening of right-sided flank pain, in the ER routine workup suggested renal insufficiency with a creatinine of around 13, CT scan of the abdomen pelvis and MRA of the chest was done both were unremarkable except for nonspecific findings as above. He was seen by renal, her labs were repeated next morning and after comparison with the original labs it was determined that her ER labs were reported in error. She had no renal insufficiency.     Note MRI chest shows some nonspecific lung findings which are more consistent with left-sided atelectasis as she had no clinical findings suggested of pneumonia, also on CT scan of abdomen rales there was an evidence of cyst in her kidney for which a repeat ultrasound can be done in the outpatient setting.     We do not have a good reason for her  chronic right-sided flank pain yet, in the future evaluation of T-spine can be considered to rule out a neurological cause. She never had any rash in this area suggestive of herpes Melvern Banker, patient so far has been following with a OB/GYN physician in [place] area and presently has no PCP locally in [place], we have provided her with our recommendation.   Low potassium was replaced today prior to discharge.    Case has been discussed with Ms. Adria Dill in hospital administration in regards to error in our lab reporting requiring hospital workup. She will follow personally with patient's family. He should is daughter also informed of the same. Patient and family satisfied with present plan.         Today   Subjective:   Julie Silva today has no headache,no chest abdominal pain,no new weakness tingling or numbness, feels much better wants to go home today.    Objective:   Blood pressure 129/61, pulse 69, temperature 98.6 F (37 C), temperature source Oral, resp. rate 17, height  (1.626 m), weight 43.591 kg (96 lb 1.6 oz), SpO2 96.00%.   Intake/Output Summary (Last 24 hours) at 02/04/13 1020 Last data filed at 02/04/13 0426  Gross per 24 hour  Intake    240 ml  Output    750 ml  Net   -510 ml    Exam Awake Alert, Oriented *3, No new F.N deficits, Normal affect [place].AT,PERRAL Supple Neck,No JVD, No cervical lymphadenopathy appriciated.  Symmetrical Chest wall movement, Good air movement bilaterally, CTAB RRR,No  Gallops,Rubs or new Murmurs, No Parasternal Heave +ve B.Sounds, Abd Soft, Non tender, No organomegaly appriciated, No rebound -guarding or rigidity. No Cyanosis, Clubbing or edema, No new Rash or bruise  Data Review   CBC w Diff: Lab Results  Component Value Date   WBC 7.4 02/03/2013   HGB 12.2 02/03/2013   HCT 36.0 02/03/2013   PLT 166 02/03/2013   LYMPHOPCT 20 02/03/2013   MONOPCT 9 02/03/2013   EOSPCT 0 02/03/2013   BASOPCT 0 02/03/2013    CMP: Lab  Results  Component Value Date   NA 140 02/04/2013   K 3.4* 02/04/2013   CL 104 02/04/2013   CO2 25 02/04/2013   BUN 12 02/04/2013   CREATININE 0.80 02/04/2013   PROT 6.4 02/03/2013   ALBUMIN 3.1* 02/03/2013   BILITOT 0.6 02/03/2013   ALKPHOS 64 02/03/2013   AST 20 02/03/2013   ALT 13 02/03/2013  .   Total Time in preparing paper work, data evaluation and todays exam - 35 minutes  Thurnell Lose M.D on 02/04/2013 at [place]  [phone]

## 2013-02-04 NOTE — Progress Notes (Signed)
D/c instructions reviewed with pt and her daughter. Both verbalized understanding of instructions.  Copy of instructions given to pt. Dietician did come to see pt and information pt requested was provided by dietician. Pt d/c'd via wheelchair with belongings, with daughter and escorted by unit NT.   Pt will follow up with new PCP Dr Asa Lente later this month, Dr Candiss Norse made aware of first available appointment for new PCP was on February 27th, was okay with that date and to have labs drawn then 2/27 as stated in his d/c instructions for pt to have done by PCP.

## 2013-02-05 LAB — UIFE/LIGHT CHAINS/TP QN, 24-HR UR
Albumin, U: DETECTED
Alpha 1, Urine: DETECTED — AB
Alpha 2, Urine: DETECTED — AB
Beta, Urine: DETECTED — AB
Free Kappa Lt Chains,Ur: 3.93 mg/dL — ABNORMAL HIGH (ref 0.14–2.42)
Free Kappa/Lambda Ratio: 10.92 ratio — ABNORMAL HIGH (ref 2.04–10.37)
Free Lambda Lt Chains,Ur: 0.36 mg/dL (ref 0.02–0.67)
Gamma Globulin, Urine: DETECTED — AB
Total Protein, Urine: 6 mg/dL

## 2013-02-05 LAB — PROTEIN ELECTROPHORESIS, SERUM
Albumin ELP: 55.5 % — ABNORMAL LOW (ref 55.8–66.1)
Alpha-1-Globulin: 5.1 % — ABNORMAL HIGH (ref 2.9–4.9)
Alpha-2-Globulin: 13.1 % — ABNORMAL HIGH (ref 7.1–11.8)
Beta 2: 4 % (ref 3.2–6.5)
Beta Globulin: 6.4 % (ref 4.7–7.2)
Gamma Globulin: 15.9 % (ref 11.1–18.8)
M-Spike, %: NOT DETECTED g/dL
Total Protein ELP: 5.9 g/dL — ABNORMAL LOW (ref 6.0–8.3)

## 2013-02-05 LAB — C4 COMPLEMENT: Complement C4, Body Fluid: 38 mg/dL (ref 10–40)

## 2013-02-05 LAB — ANA: Anti Nuclear Antibody(ANA): NEGATIVE

## 2013-02-05 LAB — C3 COMPLEMENT: C3 Complement: 150 mg/dL (ref 90–180)

## 2013-02-12 DIAGNOSIS — H4010X0 Unspecified open-angle glaucoma, stage unspecified: Secondary | ICD-10-CM | POA: Diagnosis not present

## 2013-02-21 DIAGNOSIS — H01009 Unspecified blepharitis unspecified eye, unspecified eyelid: Secondary | ICD-10-CM | POA: Diagnosis not present

## 2013-02-27 ENCOUNTER — Telehealth: Payer: Self-pay | Admitting: Internal Medicine

## 2013-02-27 ENCOUNTER — Other Ambulatory Visit: Payer: Medicare Other

## 2013-02-27 NOTE — Telephone Encounter (Signed)
Patient has an appt tomorrow with Dr. Asa Lente.  She was just discharged from the hospital.  The hospital is requesting a cbc and a cmp.  Patient would like to have orders sent on down to lab.

## 2013-02-27 NOTE — Telephone Encounter (Signed)
As I spoke with the lab pt is a new patient schedule to see [name] on Friday 03/01/13. We can not place any orders until she see [name]...Julie Silva

## 2013-02-27 NOTE — Telephone Encounter (Signed)
Told patient this info

## 2013-03-01 ENCOUNTER — Encounter: Payer: Self-pay | Admitting: Physician Assistant

## 2013-03-01 ENCOUNTER — Ambulatory Visit: Payer: Medicare Other | Admitting: Physician Assistant

## 2013-03-01 ENCOUNTER — Ambulatory Visit (INDEPENDENT_AMBULATORY_CARE_PROVIDER_SITE_OTHER): Payer: Medicare Other | Admitting: Physician Assistant

## 2013-03-01 VITALS — BP 130/68 | HR 126 | Temp 97.2°F | Ht 64.0 in | Wt 97.0 lb

## 2013-03-01 DIAGNOSIS — Z87898 Personal history of other specified conditions: Secondary | ICD-10-CM

## 2013-03-01 DIAGNOSIS — R002 Palpitations: Secondary | ICD-10-CM | POA: Diagnosis not present

## 2013-03-01 DIAGNOSIS — Z0000 Encounter for general adult medical examination without abnormal findings: Secondary | ICD-10-CM | POA: Diagnosis not present

## 2013-03-01 NOTE — Progress Notes (Signed)
Pre-visit discussion using our clinic review tool. No additional management support is needed unless otherwise documented below in the visit note.  

## 2013-03-01 NOTE — Patient Instructions (Signed)
It was great to meet you today Julie Silva!   I have ordered labs and an xray of the thoracic spine today. Please report to the basement to have these completed.    I have ordered a Cardiac event monitor as well, you will be contacted regarding details of where and when to pick these up.   Palpitations  A palpitation is the feeling that your heartbeat is irregular. It may feel like your heart is fluttering or skipping a beat. It may also feel like your heart is beating faster than normal. This is usually not a serious problem. In some cases, you may need more medical tests. HOME CARE  Avoid:  Caffeine in coffee, tea, soft drinks, diet pills, and energy drinks.  Chocolate.  Alcohol.  Stop smoking if you smoke.  Reduce your stress and anxiety. Try:  A method that measures bodily functions so you can learn to control them (biofeedback).  Yoga.  Meditation.  Physical activity such as swimming, jogging, or walking.  Get plenty of rest and sleep. GET HELP RIGHT AWAY IF:   You have chest pain.  You feel short of breath.  You have a very bad headache.  You feel dizzy or pass out (faint).  Your fast or irregular heartbeat continues after 24 hours.  Your palpitations occur more often. MAKE SURE YOU:   Understand these instructions.  Will watch your condition.  Will get help right away if you are not doing well or get worse. Document Released: 09/29/2007 Document Revised: 06/21/2011 Document Reviewed: 02/18/2011 [facility] Patient Information 2014 [place].

## 2013-03-01 NOTE — Progress Notes (Signed)
Subjective:    Patient ID: Dedra Skeens, female    DOB: 1927-06-16, 80 y.o.   MRN: [id number]  HPI Comments: Patient is a very pleasant 80 year old female who presents to the office to establish care. Patient is accompanied by her daughter who helps to provide history. Patient reports previous primary care and prescriptions provided by Urologist at [facility]. Patient continues to see urologist however would like a PCP in local area. Recent ED to floor admission for right flank pain. Daughter explains admission and extensive testing due to mix up of blood samples with another patient. Right flank pain is not a new problem. Had similar episode nearly one year ago. Recent episode at beginning of this month. Described as sharp constant pain in upper right flank with intermittent waves of nausea, resolved now. Daughter explains mother went to ED blood work showed acute renal failure, was admitted. Subsequent testing all within normal ranges, no findings on MRI or abdominal CT. Reports pain under control prior to discharge. Patient states she is always aware of the pain explaining it is a mostly constant dull pain. Symptoms not currently happening. Patient reports in last week has had intermittent palpitations, lasting only minutes along with a wave of heat over her without breaking a sweat. Reports palpitation episode takes her breath away from her for a minute then all symptoms resolve quickly. Notices this happening with mild exertion. History of bladder prolapse, sling placed, reports sling placed to tight to allow for complete emptying of bladder, necessary to self catheterize once daily before bed, also treats prophylactically with Macrobid. Other history of osteopenia treated with calcium carbonate and vitamin D. Denies current chest pains/palpitations, SOB, chest tightening, extremity swelling, N/V/F/C, blood in urine or stool, numbness, dizziness, lightheaded or weakness.      Review of Systems    Constitutional: Negative for fever, chills and appetite change.  HENT: Negative for congestion and ear pain.   Eyes: Negative for visual disturbance.  Respiratory: Positive for shortness of breath (when having palpitations). Negative for cough and chest tightness.   Cardiovascular: Positive for palpitations (intermittent, lasting only a minute or so). Negative for chest pain and leg swelling.  Gastrointestinal: Positive for nausea (with palpitations). Negative for vomiting.  Genitourinary: Positive for flank pain (right upper, dull, constant with intermittent sharp cramping pains) and difficulty urinating (chronic, self caths once daily in evening). Negative for hematuria and pelvic pain.  Musculoskeletal: Negative for gait problem.  Skin: Negative for color change and rash.  Neurological: Negative for dizziness, weakness, numbness and headaches.   Past Medical History  Diagnosis Date  . Anemia   . Bleeds easily     "father was a hemophiliac"   . History of blood transfusion 1957    "lots; most related to my periods"  . Migraines     "years ago" (02/04/2013)  . Glaucoma of both eyes   . Arthritis   . Chicken pox   . Diverticulitis   . Urine incontinence    Family History  Problem Relation Age of Onset  . Stroke Father   . Cancer - Other Sister     Breast  . Cancer - Other Brother     Throat  . Cancer - Other Sister     Throat  . Cancer - Other      Brain   History   Social History  . Marital Status: Widowed    Spouse Name: N/A    Number of Children: 4  .  Years of Education: 12   Occupational History  . Retired    Social History Main Topics  . Smoking status: Never Smoker   . Smokeless tobacco: Never Used  . Alcohol Use: No  . Drug Use: No  . Sexual Activity: No   Other Topics Concern  . None   Social History Narrative  . None   Allergies  Allergen Reactions  . Codeine Other (See Comments)    Passed out   Current Outpatient Prescriptions on File Prior  to Visit  Medication Sig Dispense Refill  . aspirin EC 81 MG tablet Take 81 mg by mouth daily.      . bimatoprost (LUMIGAN) 0.01 % SOLN Place 1 drop into both eyes at bedtime.      . Calcium Carbonate Antacid (TUMS PO) Take 1 tablet by mouth 3 (three) times daily.      . cholecalciferol (VITAMIN D) 1000 UNITS tablet Take 1,000 Units by mouth daily with lunch.      . estrogens, conjugated, (PREMARIN) 0.625 MG tablet Take 0.3125 mg by mouth daily with supper.      Marland Kitchen ibuprofen (ADVIL,MOTRIN) 200 MG tablet Take 200-400 mg by mouth every 4 (four) hours as needed (pain).      . Multiple Vitamin (MULTIVITAMIN WITH MINERALS) TABS tablet Take 1 tablet by mouth daily with lunch. Centrum      . Multiple Vitamins-Minerals (PRESERVISION AREDS) CAPS Take 1 capsule by mouth 2 (two) times daily.      . nitrofurantoin, macrocrystal-monohydrate, (MACROBID) 100 MG capsule Take 100 mg by mouth at bedtime. Continuous course       No current facility-administered medications on file prior to visit.       Objective:   Physical Exam  Vitals reviewed. Constitutional: She is oriented to person, place, and time. She appears well-developed and well-nourished. No distress.  HENT:  Head: Normocephalic and atraumatic.  Right Ear: External ear normal.  Left Ear: External ear normal.  Nose: Nose normal.  Mouth/Throat: Oropharynx is clear and moist.  Eyes: Conjunctivae and EOM are normal. Pupils are equal, round, and reactive to light. No scleral icterus.  Neck: Trachea normal and normal range of motion. Normal carotid pulses present. Carotid bruit is not present. No tracheal deviation present.  Cardiovascular: Normal rate, regular rhythm, S1 normal, normal heart sounds and intact distal pulses.  Exam reveals no gallop and no friction rub.   No murmur heard. Pulses:      Radial pulses are 2+ on the right side, and 2+ on the left side.       Posterior tibial pulses are 2+ on the right side, and 2+ on the left side.    Pulse 90 bpm, no LE edema noted.   Pulmonary/Chest: Effort normal and breath sounds normal. No respiratory distress. She has no wheezes. She has no rhonchi. She has no rales. She exhibits no tenderness.  Abdominal: Soft. Normal appearance. There is no tenderness. There is no rebound, no guarding and no CVA tenderness.  Musculoskeletal:  FROM U/LE, ambulates without difficulty, climbs up and down exam table stair without difficulty or unsteadiness. Single finger point by patient is right lateral paraspinals of lower thoracic region. Right flank without reproducible tenderness to palpation. Overlying soft tissue without erythema, ecchymosis, tactile heat, rash or vesicles.   Lymphadenopathy:    She has no cervical adenopathy.       Right: No supraclavicular adenopathy present.       Left: No supraclavicular adenopathy present.  Neurological:  She is alert and oriented to person, place, and time. She has normal strength. No cranial nerve deficit or sensory deficit. Coordination and gait normal. GCS eye subscore is 4. GCS verbal subscore is 5. GCS motor subscore is 6.  DTR's intact.  Skin: No rash noted. She is not diaphoretic.      Filed Vitals:   03/01/13 1334  BP: 130/68  Pulse: 126  Temp: 97.2 F (36.2 C)   Wt Readings from Last 3 Encounters:  03/01/13 97 lb (43.999 kg)  02/04/13 96 lb 1.6 oz (43.591 kg)   Temp Readings from Last 3 Encounters:  03/01/13 97.2 F (36.2 C) Oral  02/04/13 98.4 F (36.9 C) Oral   BP Readings from Last 3 Encounters:  03/01/13 130/68  02/04/13 148/69   Pulse Readings from Last 3 Encounters:  03/01/13 126  02/04/13 67     Lab Results  Component Value Date   WBC 7.4 02/03/2013   HGB 12.2 02/03/2013   HCT 36.0 02/03/2013   PLT 166 02/03/2013   GLUCOSE 82 02/04/2013   ALT 13 02/03/2013   AST 20 02/03/2013   NA 140 02/04/2013   K 3.4* 02/04/2013   CL 104 02/04/2013   CREATININE 0.80 02/04/2013   BUN 12 02/04/2013   CO2 25 02/04/2013   I have reviewed the MRA,  CT, ECG and labs from recent hospitalization.     Patient lives alone, is not experiencing falls at home, is able to perform ADL's without difficulty. Is not experiencing depression.  Assessment & Plan:   Patient has been counseled on age-appropriate routine health concerns for screening and prevention. These are reviewed and up-to-date. Immunizations are up-to-date or declined. Labs and ECG reviewed.    Right Flank pain: Will get plain film xray today.  Hospital diagnostics normal, no calculi noted.   Palpitations.  Order event monitor today.

## 2013-03-04 ENCOUNTER — Ambulatory Visit (INDEPENDENT_AMBULATORY_CARE_PROVIDER_SITE_OTHER)
Admission: RE | Admit: 2013-03-04 | Discharge: 2013-03-04 | Disposition: A | Discharge status: Home / Self Care | Payer: Medicare Other | Source: Ambulatory Visit | Attending: Physician Assistant | Admitting: Physician Assistant

## 2013-03-04 ENCOUNTER — Other Ambulatory Visit (INDEPENDENT_AMBULATORY_CARE_PROVIDER_SITE_OTHER): Payer: Medicare Other

## 2013-03-04 ENCOUNTER — Other Ambulatory Visit: Payer: Self-pay | Admitting: Physician Assistant

## 2013-03-04 DIAGNOSIS — N19 Unspecified kidney failure: Secondary | ICD-10-CM

## 2013-03-04 DIAGNOSIS — R002 Palpitations: Secondary | ICD-10-CM

## 2013-03-04 DIAGNOSIS — Z0000 Encounter for general adult medical examination without abnormal findings: Secondary | ICD-10-CM

## 2013-03-04 DIAGNOSIS — M546 Pain in thoracic spine: Secondary | ICD-10-CM | POA: Diagnosis not present

## 2013-03-04 LAB — CBC WITH DIFFERENTIAL/PLATELET
Basophils Absolute: 0 10*3/uL (ref 0.0–0.1)
Basophils Relative: 0.5 % (ref 0.0–3.0)
Eosinophils Absolute: 0.1 10*3/uL (ref 0.0–0.7)
Eosinophils Relative: 1.1 % (ref 0.0–5.0)
HCT: 40.2 % (ref 36.0–46.0)
Hemoglobin: 13.3 g/dL (ref 12.0–15.0)
Lymphocytes Relative: 22 % (ref 12.0–46.0)
Lymphs Abs: 1.6 10*3/uL (ref 0.7–4.0)
MCHC: 33.1 g/dL (ref 30.0–36.0)
MCV: 95.2 fl (ref 78.0–100.0)
Monocytes Absolute: 0.6 10*3/uL (ref 0.1–1.0)
Monocytes Relative: 7.9 % (ref 3.0–12.0)
Neutro Abs: 4.9 10*3/uL (ref 1.4–7.7)
Neutrophils Relative %: 68.5 % (ref 43.0–77.0)
Platelets: 237 10*3/uL (ref 150.0–400.0)
RBC: 4.23 Mil/uL (ref 3.87–5.11)
RDW: 13.3 % (ref 11.5–14.6)
WBC: 7.1 10*3/uL (ref 4.5–10.5)

## 2013-03-04 LAB — URINALYSIS, ROUTINE W REFLEX MICROSCOPIC
Bilirubin Urine: NEGATIVE
Ketones, ur: NEGATIVE
Leukocytes, UA: NEGATIVE
Nitrite: NEGATIVE
Specific Gravity, Urine: <=1.005 — AB (ref 1.000–1.030)
Total Protein, Urine: NEGATIVE
Urine Glucose: NEGATIVE
Urobilinogen, UA: 0.2 (ref 0.0–1.0)
pH: 7 (ref 5.0–8.0)

## 2013-03-04 LAB — BASIC METABOLIC PANEL
BUN: 10 mg/dL (ref 6–23)
CO2: 30 mEq/L (ref 19–32)
Calcium: 9.7 mg/dL (ref 8.4–10.5)
Chloride: 102 mEq/L (ref 96–112)
Creatinine, Ser: 0.8 mg/dL (ref 0.4–1.2)
GFR: 78.3 mL/min (ref >60.00)
Glucose, Bld: 96 mg/dL (ref 70–99)
Potassium: 4.4 mEq/L (ref 3.5–5.1)
Sodium: 140 mEq/L (ref 135–145)

## 2013-03-06 ENCOUNTER — Encounter: Payer: Self-pay | Admitting: *Deleted

## 2013-03-06 ENCOUNTER — Encounter (INDEPENDENT_AMBULATORY_CARE_PROVIDER_SITE_OTHER): Payer: Medicare Other

## 2013-03-06 DIAGNOSIS — R002 Palpitations: Secondary | ICD-10-CM

## 2013-03-06 NOTE — Progress Notes (Signed)
Patient ID: Julie Silva, female   DOB: 04-19-1927, 80 y.o.   MRN: [id number] E-Cardio verite 30 day cardiac event monitor applied to patient.

## 2013-04-02 ENCOUNTER — Other Ambulatory Visit: Payer: Medicare Other

## 2013-04-02 ENCOUNTER — Other Ambulatory Visit (INDEPENDENT_AMBULATORY_CARE_PROVIDER_SITE_OTHER): Payer: Medicare Other

## 2013-04-02 ENCOUNTER — Encounter: Payer: Self-pay | Admitting: Family Medicine

## 2013-04-02 ENCOUNTER — Telehealth: Payer: Self-pay | Admitting: Family Medicine

## 2013-04-02 ENCOUNTER — Ambulatory Visit (INDEPENDENT_AMBULATORY_CARE_PROVIDER_SITE_OTHER): Payer: Medicare Other | Admitting: Family Medicine

## 2013-04-02 VITALS — BP 136/84 | HR 99 | Temp 97.5°F

## 2013-04-02 DIAGNOSIS — M549 Dorsalgia, unspecified: Secondary | ICD-10-CM

## 2013-04-02 DIAGNOSIS — R110 Nausea: Secondary | ICD-10-CM | POA: Diagnosis not present

## 2013-04-02 DIAGNOSIS — M546 Pain in thoracic spine: Secondary | ICD-10-CM | POA: Insufficient documentation

## 2013-04-02 LAB — POCT URINALYSIS DIPSTICK
Bilirubin, UA: NEGATIVE
Glucose, UA: NEGATIVE
Ketones, UA: NEGATIVE
Leukocytes, UA: NEGATIVE
Nitrite, UA: NEGATIVE
Spec Grav, UA: 1.02
Urobilinogen, UA: 0.2
pH, UA: 7

## 2013-04-02 LAB — CARDIAC PANEL
CK-MB: 2.5 ng/mL (ref 0.3–4.0)
Relative Index: 3.2 calc — ABNORMAL HIGH (ref 0.0–2.5)
Total CK: 77 U/L (ref 7–177)

## 2013-04-02 LAB — COMPREHENSIVE METABOLIC PANEL
ALT: 18 U/L (ref 0–35)
AST: 33 U/L (ref 0–37)
Albumin: 4.1 g/dL (ref 3.5–5.2)
Alkaline Phosphatase: 91 U/L (ref 39–117)
BUN: 15 mg/dL (ref 6–23)
CO2: 25 mEq/L (ref 19–32)
Calcium: 9.5 mg/dL (ref 8.4–10.5)
Chloride: 103 mEq/L (ref 96–112)
Creatinine, Ser: 0.7 mg/dL (ref 0.4–1.2)
GFR: 80.76 mL/min (ref >60.00)
Glucose, Bld: 102 mg/dL — ABNORMAL HIGH (ref 70–99)
Potassium: 4 mEq/L (ref 3.5–5.1)
Sodium: 140 mEq/L (ref 135–145)
Total Bilirubin: 1.2 mg/dL (ref 0.3–1.2)
Total Protein: 7.6 g/dL (ref 6.0–8.3)

## 2013-04-02 LAB — CBC WITH DIFFERENTIAL/PLATELET
Basophils Absolute: 0 10*3/uL (ref 0.0–0.1)
Basophils Relative: 0.3 % (ref 0.0–3.0)
Eosinophils Absolute: 0 10*3/uL (ref 0.0–0.7)
Eosinophils Relative: 0.5 % (ref 0.0–5.0)
HCT: 40.6 % (ref 36.0–46.0)
Hemoglobin: 13.5 g/dL (ref 12.0–15.0)
Lymphocytes Relative: 12 % (ref 12.0–46.0)
Lymphs Abs: 0.8 10*3/uL (ref 0.7–4.0)
MCHC: 33.3 g/dL (ref 30.0–36.0)
MCV: 95 fl (ref 78.0–100.0)
Monocytes Absolute: 0.4 10*3/uL (ref 0.1–1.0)
Monocytes Relative: 5.4 % (ref 3.0–12.0)
Neutro Abs: 5.6 10*3/uL (ref 1.4–7.7)
Neutrophils Relative %: 81.8 % — ABNORMAL HIGH (ref 43.0–77.0)
Platelets: 222 10*3/uL (ref 150.0–400.0)
RBC: 4.28 Mil/uL (ref 3.87–5.11)
RDW: 13.4 % (ref 11.5–14.6)
WBC: 6.9 10*3/uL (ref 4.5–10.5)

## 2013-04-02 MED ORDER — ONDANSETRON HCL 4 MG/2ML IJ SOLN
4.0000 mg | Freq: Once | INTRAMUSCULAR | Status: AC
  Administered 2013-04-02: 4 mg via INTRAMUSCULAR

## 2013-04-02 MED ORDER — CYCLOBENZAPRINE HCL 5 MG PO TABS: 2.5000 mg | ORAL_TABLET | Freq: Two times a day (BID) | ORAL | Status: DC | PRN

## 2013-04-02 MED ORDER — ONDANSETRON 8 MG PO TBDP: 8.0000 mg | ORAL_TABLET | Freq: Three times a day (TID) | ORAL | Status: DC | PRN

## 2013-04-02 MED ORDER — CEPHALEXIN 500 MG PO CAPS: 500.0000 mg | ORAL_CAPSULE | ORAL | Status: DC

## 2013-04-02 MED ORDER — TRAMADOL HCL 50 MG PO TABS: 25.0000 mg | ORAL_TABLET | Freq: Two times a day (BID) | ORAL | Status: DC | PRN

## 2013-04-02 MED ORDER — KETOROLAC TROMETHAMINE 60 MG/2ML IM SOLN
30.0000 mg | Freq: Once | INTRAMUSCULAR | Status: AC
  Administered 2013-04-02: 30 mg via INTRAMUSCULAR

## 2013-04-02 MED ORDER — MIRTAZAPINE 15 MG PO TABS: 7.5000 mg | ORAL_TABLET | Freq: Every day | ORAL | Status: DC

## 2013-04-02 NOTE — Assessment & Plan Note (Signed)
Patient is having left-sided back pain overall. Patient is having more of a colicky type pain. Patient is already had this worked up before with a hospitalization with multiple imaging but did not show any significant findings. Patient on physical exam though is presenting like she does have a potential kidney stone. Patient's urinary analysis today does show that patient has hemoglobin but no sign of infection. I would like to get labs which included CBC as well as a basic metabolic panel and creatinine kinase levels. Patient's differential includes kidney stone been the most likely, sarcopenia, torn portion gastrointestinal. We discussed at great length with the patient as well as patient's primary caregiver. We discussed different treatment options at this time the patient would like to try a muscle relaxer and low dose secondary to her age. Patient also given tramadol to see if this will be beneficial. Patient was given a shot of Toradol today which did make about a 30% improvement. Unable to use Flomax encases his a kidney stone secondary to history of glaucoma. Discussed aid well-hydrated that could be beneficial. Discuss diet and protein supplementation to help with weight loss. Patient will come back in one week for further followup. If she has any significant worsening pain she can go to the emergency department for further imaging. Patient continues to have trouble I would consider a workup with urology been another key. contributor.

## 2013-04-02 NOTE — Progress Notes (Signed)
  [facility] [place], [place] [address] Phone: [phone] Subjective:      CC: Back pain  [id number] Julie Silva is a 80 y.o. female coming in with complaint of left lower back pain. Patient was seen previously one month ago and did have a hospitalization for an acute onset of low back pain. Patient did have significant work up with CT scans and advanced imaging that was fairly unremarkable. Patient states this time she started having pain on the left side. Patient states it seems to be intermittent coming and going every 10-15 minutes and stained for about multiple seconds as severe 10 out of 10 pain in and seems to go away. Patient has tried some over-the-counter medications with no improvement and states that it is almost impossible 2 relaxed secondary to pain. Patient denies any bowel or bladder trouble. Patient has been eating comfortably in and states that she does have some intermittent nausea but no recent fevers or chills. Patient has been losing weight slowly over the course multiple years but this is secondary to decrease appetite.     Past medical history, social, surgical and family history all reviewed in electronic medical record.   Review of Systems: No headache, visual changes, nausea, vomiting, diarrhea, constipation, dizziness, abdominal pain, skin rash, fevers, chills, night sweats, weight loss, swollen lymph nodes, body aches, joint swelling, muscle aches, chest pain, shortness of breath, mood changes.   Objective Blood pressure 136/84, pulse 99, temperature 97.5 F (36.4 C), temperature source Oral, SpO2 96.00%.  General:  apparent distress alert and oriented x3 mood and affect normal, dressed appropriately. Frail underweight moving around room due to pain.  HEENT: Pupils equal, extraocular movements intact lens replaced.  Respiratory: Patient's speak in full sentences and does not appear short of breath ,  CTAB Cardiovascular: No lower extremity edema, non tender, no erythema  Skin: Warm dry intact with no signs of infection or rash on extremities or on axial skeleton.  Abdomen: Soft nontender  Neuro: Cranial nerves II through XII are intact, neurovascularly intact in all extremities with 2+ DTRs and 2+ pulses.  Lymph: No lymphadenopathy of posterior or anterior cervical chain or axillae bilaterally.  Gait normal with good balance and coordination.  MSK:  Non tender with full range of motion and good stability and symmetric strength and tone of shoulders, elbows, wrist, hip, knee and ankles bilaterally.  Back Exam:  Inspection: Unremarkable  Motion: Flexion 45 deg, Extension 45 deg, Side Bending to 45 deg bilaterally,  Rotation to 45 deg bilaterally  SLR laying: Negative  XSLR laying: Negative  Palpable tenderness: No CVA tenderness.  FABER: negative. Sensory change: Gross sensation intact to all lumbar and sacral dermatomes.  Reflexes: 2+ at both patellar tendons, 2+ at achilles tendons, Babinski's downgoing.  Strength at foot  Plantar-flexion: 5/5 Dorsi-flexion: 5/5 Eversion: 5/5 Inversion: 5/5  Leg strength  Quad: 5/5 Hamstring: 5/5 Hip flexor: 5/5 Hip abductors: 5/5      Impression and Recommendations:     This case required medical decision making of moderate complexity.

## 2013-04-02 NOTE — Patient Instructions (Addendum)
Good to meet you both.  I am giving you injection to help with the pain.  Take tylenol 325 mg three times a day is the best evidence based medicine we have for arthritis.  Glucosamine sulfate 750mg  twice a day is a supplement that has been shown to help moderate to severe arthritis. Vitamin D 2000 IU daily Fish oil 2 grams daily.  Tumeric 500mg  twice daily.  Capsaicin topically up to four times a day may also help with pain. Decrease aspirin to 3 times a week.  Boost or ensure 4 times daily 45 minutes before meals and at bedtime.  remeron at night.  Flexeril if you really need it but only 1/2 tab and will make you sleepy.  Come back and see me in 1 week.

## 2013-04-02 NOTE — Addendum Note (Signed)
Addended by: Lyndal Pulley on: 04/02/2013 04:23 PM   Modules accepted: Orders

## 2013-04-02 NOTE — Telephone Encounter (Signed)
Called patient daughter and stated that she is normal on labs, no sign of infection. Patient is to continue to have pain she will call back and we will consider getting another CT to rule out another kidney stone based on patient's presentation.

## 2013-04-03 LAB — URINE CULTURE: Colony Count: 70000

## 2013-04-09 ENCOUNTER — Telehealth: Payer: Self-pay | Admitting: Physician Assistant

## 2013-04-09 ENCOUNTER — Ambulatory Visit (INDEPENDENT_AMBULATORY_CARE_PROVIDER_SITE_OTHER): Payer: Medicare Other | Admitting: Family Medicine

## 2013-04-09 ENCOUNTER — Encounter: Payer: Self-pay | Admitting: Family Medicine

## 2013-04-09 VITALS — BP 130/80 | Wt 97.0 lb

## 2013-04-09 DIAGNOSIS — M546 Pain in thoracic spine: Secondary | ICD-10-CM

## 2013-04-09 DIAGNOSIS — N200 Calculus of kidney: Secondary | ICD-10-CM | POA: Diagnosis not present

## 2013-04-09 NOTE — Progress Notes (Signed)
  [facility] [place], [place] [address] Phone: [phone] Subjective:      CC: Back pain follow up  [id number] Julie Silva is a 80 y.o. female coming in for followup of back pain. Patient is back pain appeared to be colicky and we were diagnosing more of a questionable kidney stone. Patient did take the Flexeril as well as pain medication, except for 12 hours which woke up the pain was completely resolved. Since that time she has not had any of the pain. Patient denies any fevers or chills or any difficulty with urination. Patient did not take the Keflex because she never had the signs or symptoms of any type of bladder infection. Once again patient's history is significant for recurrent cystitis as well as bladder surgery previously. Patient denies any abdominal pain denies ever passing a stone.     Past medical history, social, surgical and family history all reviewed in electronic medical record.   Review of Systems: No headache, visual changes, nausea, vomiting, diarrhea, constipation, dizziness, abdominal pain, skin rash, fevers, chills, night sweats, weight loss, swollen lymph nodes, body aches, joint swelling, muscle aches, chest pain, shortness of breath, mood changes.   Objective Blood pressure 130/80, weight 97 lb (43.999 kg), SpO2 97.00%.  General:  Well appearing  alert and oriented x3 mood and affect normal, dressed appropriately. Frail underweight  HEENT: Pupils equal, extraocular movements intact lens replaced.  Respiratory: Patient's speak in full sentences and does not appear short of breath , CTAB Cardiovascular: No lower extremity edema, non tender, no erythema  Skin: Warm dry intact with no signs of infection or rash on extremities or on axial skeleton.  Abdomen: Soft nontender  Neuro: Cranial nerves II through XII are intact, neurovascularly intact in all extremities with 2+ DTRs and 2+ pulses.  Lymph: No lymphadenopathy  of posterior or anterior cervical chain or axillae bilaterally.  Gait normal with good balance and coordination.  MSK:  Non tender with full range of motion and good stability and symmetric strength and tone of shoulders, elbows, wrist, hip, knee and ankles bilaterally.  Back Exam:  Inspection: Unremarkable  Motion: Flexion 45 deg, Extension 45 deg, Side Bending to 45 deg bilaterally,  Rotation to 45 deg bilaterally  SLR laying: Negative  XSLR laying: Negative  Palpable tenderness: No CVA tenderness.  FABER: negative. Sensory change: Gross sensation intact to all lumbar and sacral dermatomes.  Reflexes: 2+ at both patellar tendons, 2+ at achilles tendons, Babinski's downgoing.  Strength at foot  Plantar-flexion: 5/5 Dorsi-flexion: 5/5 Eversion: 5/5 Inversion: 5/5  Leg strength  Quad: 5/5 Hamstring: 5/5 Hip flexor: 5/5 Hip abductors: 5/5      Impression and Recommendations:     This case required medical decision making of moderate complexity.

## 2013-04-09 NOTE — Assessment & Plan Note (Signed)
Spent greater than 25 minutes with patient face-to-face and had greater than 50% of counseling including as described above in assessment and plan. We did discuss all her questions as well as patient's daughter short questions. They would like to have more of an explanation and the potential for a kidney stone. I discussed with her that I would followup with her gynecologist and question a referral to urology in the long run. In the interim we will get an ultrasound of the kidney to see if there is any other further kidney stone that could be contributing. Patient is concerned that this will occur again. We discussed diet changes as well as increasing water that could be beneficial in trying to avoid any other calcium problems. Patient will also change the type of calcium she is taking which could be helpful. Patient can follow up on an as-needed basis.

## 2013-04-09 NOTE — Telephone Encounter (Signed)
Pt had the test done on 03/01/13 with [facility] but never got the result. Please call pt

## 2013-04-09 NOTE — Patient Instructions (Signed)
Good to see you again and doing better Try calcium citrate instead of calcium carbonate.  Use pain medicines only when you need it. We will get ultrasound and see if they see a stone.  Water, Water, Water! Continue to  Cranberry juice or pills may help Call me next week. And we will see what the event monitor. We will print when we have so you can discuss with cardiologist.  Come back when you need me.

## 2013-04-09 NOTE — Telephone Encounter (Signed)
Spoke with pt advised result is still in progress.

## 2013-04-15 ENCOUNTER — Ambulatory Visit
Admission: RE | Admit: 2013-04-15 | Discharge: 2013-04-15 | Disposition: A | Discharge status: Home / Self Care | Payer: Medicare Other | Source: Ambulatory Visit | Attending: Family Medicine | Admitting: Family Medicine

## 2013-04-15 DIAGNOSIS — R109 Unspecified abdominal pain: Secondary | ICD-10-CM | POA: Diagnosis not present

## 2013-04-15 DIAGNOSIS — N200 Calculus of kidney: Secondary | ICD-10-CM

## 2013-04-16 ENCOUNTER — Telehealth: Payer: Self-pay | Admitting: *Deleted

## 2013-04-16 ENCOUNTER — Other Ambulatory Visit: Payer: Self-pay | Admitting: Internal Medicine

## 2013-04-16 DIAGNOSIS — I471 Supraventricular tachycardia: Secondary | ICD-10-CM

## 2013-04-16 NOTE — Telephone Encounter (Signed)
Pt called requesting results. Please advise  

## 2013-04-17 NOTE — Telephone Encounter (Signed)
Ultrasound of kidneys normal, pleas call patient when you get a chance.

## 2013-04-17 NOTE — Telephone Encounter (Signed)
Discussed with pt's daughter

## 2013-05-07 ENCOUNTER — Other Ambulatory Visit (INDEPENDENT_AMBULATORY_CARE_PROVIDER_SITE_OTHER): Payer: Medicare Other

## 2013-05-07 ENCOUNTER — Ambulatory Visit (INDEPENDENT_AMBULATORY_CARE_PROVIDER_SITE_OTHER): Payer: Medicare Other | Admitting: Internal Medicine

## 2013-05-07 ENCOUNTER — Encounter: Payer: Self-pay | Admitting: Internal Medicine

## 2013-05-07 VITALS — BP 164/74 | HR 66 | Temp 97.6°F | Resp 16 | Ht 64.0 in | Wt 96.2 lb

## 2013-05-07 DIAGNOSIS — I498 Other specified cardiac arrhythmias: Secondary | ICD-10-CM | POA: Diagnosis not present

## 2013-05-07 DIAGNOSIS — I10 Essential (primary) hypertension: Secondary | ICD-10-CM

## 2013-05-07 DIAGNOSIS — R7309 Other abnormal glucose: Secondary | ICD-10-CM

## 2013-05-07 DIAGNOSIS — Z23 Encounter for immunization: Secondary | ICD-10-CM

## 2013-05-07 DIAGNOSIS — E785 Hyperlipidemia, unspecified: Secondary | ICD-10-CM

## 2013-05-07 DIAGNOSIS — M949 Disorder of cartilage, unspecified: Secondary | ICD-10-CM

## 2013-05-07 DIAGNOSIS — Z0000 Encounter for general adult medical examination without abnormal findings: Secondary | ICD-10-CM | POA: Insufficient documentation

## 2013-05-07 DIAGNOSIS — I471 Supraventricular tachycardia: Secondary | ICD-10-CM

## 2013-05-07 DIAGNOSIS — M8580 Other specified disorders of bone density and structure, unspecified site: Secondary | ICD-10-CM

## 2013-05-07 DIAGNOSIS — H9190 Unspecified hearing loss, unspecified ear: Secondary | ICD-10-CM

## 2013-05-07 DIAGNOSIS — M899 Disorder of bone, unspecified: Secondary | ICD-10-CM

## 2013-05-07 LAB — LIPID PANEL
Cholesterol: 172 mg/dL (ref 0–200)
HDL: 72.8 mg/dL (ref >39.00)
LDL Cholesterol: 85 mg/dL (ref 0–99)
Total CHOL/HDL Ratio: 2
Triglycerides: 69 mg/dL (ref 0.0–149.0)
VLDL: 13.8 mg/dL (ref 0.0–40.0)

## 2013-05-07 LAB — BASIC METABOLIC PANEL
BUN: 13 mg/dL (ref 6–23)
CO2: 28 mEq/L (ref 19–32)
Calcium: 9.2 mg/dL (ref 8.4–10.5)
Chloride: 103 mEq/L (ref 96–112)
Creatinine, Ser: 0.7 mg/dL (ref 0.4–1.2)
GFR: 89.15 mL/min (ref >60.00)
Glucose, Bld: 84 mg/dL (ref 70–99)
Potassium: 3.9 mEq/L (ref 3.5–5.1)
Sodium: 140 mEq/L (ref 135–145)

## 2013-05-07 LAB — TSH: TSH: 2.72 u[IU]/mL (ref 0.35–4.50)

## 2013-05-07 LAB — T3, FREE: T3, Free: 3 pg/mL (ref 2.3–4.2)

## 2013-05-07 LAB — T4: T4, Total: 12.8 ug/dL — ABNORMAL HIGH (ref 5.0–12.5)

## 2013-05-07 LAB — HEMOGLOBIN A1C: Hgb A1c MFr Bld: 5.5 % (ref 4.6–6.5)

## 2013-05-07 NOTE — Assessment & Plan Note (Signed)
I will check her FLP and TSH today

## 2013-05-07 NOTE — Assessment & Plan Note (Signed)
I discussed with the pt and her daughter starting a CCB to treat this and the SVT but they did not agree to do this over concerns for side effects I will check her labs today to look for end organ damage and secondary causes of HTN

## 2013-05-07 NOTE — Progress Notes (Signed)
Pre visit review using our clinic review tool, if applicable. No additional management support is needed unless otherwise documented below in the visit note. 

## 2013-05-07 NOTE — Patient Instructions (Signed)
Nonspecific Tachycardia Tachycardia is a faster than normal heartbeat (more than 100 beats per minute). In adults, the heart normally beats between 60 and 100 times a minute. A fast heartbeat may be a normal response to exercise or stress. It does not necessarily mean that something is wrong. However, sometimes when your heart beats too fast it may not be able to pump enough blood to the rest of your body. This can result in chest pain, shortness of breath, dizziness, and even fainting. Nonspecific tachycardia means that the specific cause or pattern of your tachycardia is unknown. CAUSES  Tachycardia may be harmless or it may be due to a more serious underlying cause. Possible causes of tachycardia include:  Exercise or exertion.  Fever.  Pain or injury.  Infection.  Loss of body fluids (dehydration).  Overactive thyroid.  Lack of red blood cells (anemia).  Anxiety and stress.  Alcohol.  Caffeine.  Tobacco products.  Diet pills.  Illegal drugs.  Heart disease. SYMPTOMS  Rapid or irregular heartbeat (palpitations).  Suddenly feeling your heart beating (cardiac awareness).  Dizziness.  Tiredness (fatigue).  Shortness of breath.  Chest pain.  Nausea.  Fainting. DIAGNOSIS  Your caregiver will perform a physical exam and take your medical history. In some cases, a heart specialist (cardiologist) may be consulted. Your caregiver may also order:  Blood tests.  Electrocardiography. This test records the electrical activity of your heart.  A heart monitoring test. TREATMENT  Treatment will depend on the likely cause of your tachycardia. The goal is to treat the underlying cause of your tachycardia. Treatment methods may include:  Replacement of fluids or blood through an intravenous (IV) tube for moderate to severe dehydration or anemia.  New medicines or changes in your current medicines.  Diet and lifestyle changes.  Treatment for certain  infections.  Stress relief or relaxation methods. HOME CARE INSTRUCTIONS   Rest.  Drink enough fluids to keep your urine clear or pale yellow.  Do not smoke.  Avoid:  Caffeine.  Tobacco.  Alcohol.  Chocolate.  Stimulants such as over-the-counter diet pills or pills that help you stay awake.  Situations that cause anxiety or stress.  Illegal drugs such as marijuana, phencyclidine (PCP), and cocaine.  Only take medicine as directed by your caregiver.  Keep all follow-up appointments as directed by your caregiver. SEEK IMMEDIATE MEDICAL CARE IF:   You have pain in your chest, upper arms, jaw, or neck.  You become weak, dizzy, or feel faint.  You have palpitations that will not go away.  You vomit, have diarrhea, or pass blood in your stool.  Your skin is cool, pale, and wet.  You have a fever that will not go away with rest, fluids, and medicine. MAKE SURE YOU:   Understand these instructions.  Will watch your condition.  Will get help right away if you are not doing well or get worse. Document Released: 01/28/2004 Document Revised: 03/14/2011 Document Reviewed: 11/30/2010 Exit[employer] Patient Information 2014 [employer].  

## 2013-05-07 NOTE — Assessment & Plan Note (Signed)
I think she may have had an episode of SVT last week, I talked to the pt and her daughter about starting a CCB but they are not willing to consider that at this time, will defer to cardiology with her appt next week I don't see any neuro concerns today I will look at her labs today to look for other causes for her "spell"

## 2013-05-07 NOTE — Assessment & Plan Note (Signed)
My order for a Vit D level was blocked

## 2013-05-07 NOTE — Assessment & Plan Note (Signed)
I will check her A1C today to see if she has developed DM2 

## 2013-05-07 NOTE — Progress Notes (Signed)
   Subjective:    Patient ID: Julie Silva, female    DOB: [birth year], 80 y.o.   MRN: [id number]  HPI Comments: New to me she is with her daughter today and complains that one week ago while she was walking her dog she had a "spell" when she felt shaky and weak all over, she did not pass out or feel syncopal. She continued walking but continued to feel "shaky" for a little more than 30 minutes. She went to lunch with her daughter and her daughter says she looked visibly shaky but otherwise normal. She felt well later that day and has been back at her baseline since then with no recurrence of these symptoms.     Review of Systems  Constitutional: Negative for fever, chills, diaphoresis, appetite change and fatigue.  HENT: Positive for hearing loss. Negative for trouble swallowing and voice change.   Eyes: Negative.  Negative for photophobia and visual disturbance.  Respiratory: Negative.  Negative for cough, choking, chest tightness, shortness of breath, wheezing and stridor.   Cardiovascular: Negative.  Negative for chest pain, palpitations and leg swelling.  Gastrointestinal: Negative.  Negative for nausea, vomiting, abdominal pain, diarrhea, constipation and blood in stool.  Endocrine: Negative.   Genitourinary: Negative.   Musculoskeletal: Negative.   Skin: Negative.   Allergic/Immunologic: Negative.   Neurological: Positive for tremors and weakness. Negative for dizziness, seizures, syncope, facial asymmetry, speech difficulty, light-headedness, numbness and headaches.  Hematological: Negative.  Negative for adenopathy. Does not bruise/bleed easily.  Psychiatric/Behavioral: Negative.        Objective:   Physical Exam  Vitals reviewed. Constitutional: She is oriented to person, place, and time. She appears well-developed and well-nourished.  Non-toxic appearance. She has a sickly appearance (very thin). She does not appear ill. No distress.  HENT:  Head: Normocephalic and atraumatic.    Mouth/Throat: Oropharynx is clear and moist. No oropharyngeal exudate.  Eyes: Conjunctivae are normal. Right eye exhibits no discharge. Left eye exhibits no discharge. No scleral icterus.  Neck: Normal range of motion. Neck supple. No JVD present. No tracheal deviation present. No thyromegaly present.  Cardiovascular: Normal rate, regular rhythm, normal heart sounds and intact distal pulses.  Exam reveals no gallop and no friction rub.   No murmur heard. Pulmonary/Chest: Effort normal and breath sounds normal. No stridor. No respiratory distress. She has no wheezes. She has no rales. She exhibits no tenderness.  Abdominal: Soft. Bowel sounds are normal. She exhibits no distension and no mass. There is no tenderness. There is no rebound and no guarding.  Musculoskeletal: Normal range of motion. She exhibits no edema and no tenderness.  Lymphadenopathy:    She has no cervical adenopathy.  Neurological: She is oriented to person, place, and time.  Skin: Skin is warm and dry. No rash noted. She is not diaphoretic. No erythema. No pallor.  Psychiatric: She has a normal mood and affect. Her behavior is normal. Judgment and thought content normal.     Lab Results  Component Value Date   WBC 6.9 04/02/2013   HGB 13.5 04/02/2013   HCT 40.6 04/02/2013   PLT 222.0 04/02/2013   GLUCOSE 102* 04/02/2013   ALT 18 04/02/2013   AST 33 04/02/2013   NA 140 04/02/2013   K 4.0 Hemolyzed 04/02/2013   CL 103 04/02/2013   CREATININE 0.7 04/02/2013   BUN 15 04/02/2013   CO2 25 04/02/2013       Assessment & Plan:

## 2013-05-07 NOTE — Assessment & Plan Note (Addendum)

## 2013-05-08 ENCOUNTER — Other Ambulatory Visit: Payer: Self-pay | Admitting: Internal Medicine

## 2013-05-08 ENCOUNTER — Telehealth: Payer: Self-pay | Admitting: Internal Medicine

## 2013-05-08 DIAGNOSIS — E0781 Sick-euthyroid syndrome: Secondary | ICD-10-CM | POA: Insufficient documentation

## 2013-05-08 NOTE — Telephone Encounter (Signed)
Relevant patient education assigned to patient using Emmi. ° °

## 2013-05-14 ENCOUNTER — Ambulatory Visit (INDEPENDENT_AMBULATORY_CARE_PROVIDER_SITE_OTHER): Payer: Medicare Other | Admitting: Cardiovascular Disease

## 2013-05-14 ENCOUNTER — Encounter: Payer: Self-pay | Admitting: Cardiovascular Disease

## 2013-05-14 VITALS — BP 142/70 | HR 70 | Ht 64.0 in | Wt 96.0 lb

## 2013-05-14 DIAGNOSIS — I498 Other specified cardiac arrhythmias: Secondary | ICD-10-CM

## 2013-05-14 DIAGNOSIS — I471 Supraventricular tachycardia: Secondary | ICD-10-CM

## 2013-05-14 NOTE — Patient Instructions (Signed)
Your physician recommends that you schedule a follow-up appointment in:  4-6 weeks.   Your physician has requested that you have an echocardiogram. Echocardiography is a painless test that uses sound waves to create images of your heart. It provides your doctor with information about the size and shape of your heart and how well your heart's chambers and valves are working. This procedure takes approximately one hour. There are no restrictions for this procedure.

## 2013-05-14 NOTE — Progress Notes (Signed)
History of Present Illness: 80 yo female with history of migraines, anemia who is referred today as a new patient for evaluation of palpitations. She has had several episodes where she felt weak and felt her heart racing. She had a cardiac monitor placed 03/06/13-04/05/13 by primary care and had several runs of SVT. She felt a "funny" feeling in her head but no dizziness. No recurrence over the last month. No near syncope or syncope. No chest pain or SOB.   Primary Care Physician: Scarlette Calico  Last Lipid Profile:Lipid Panel     Component Value Date/Time   CHOL 172 05/07/2013 1045   TRIG 69.0 05/07/2013 1045   HDL 72.80 05/07/2013 1045   CHOLHDL 2 05/07/2013 1045   VLDL 13.8 05/07/2013 1045   LDLCALC 85 05/07/2013 1045     Past Medical History  Diagnosis Date  . Anemia   . Bleeds easily     "father was a hemophiliac"   . History of blood transfusion 1957    "lots; most related to my periods"  . Migraines     "years ago" (02/04/2013)  . Glaucoma of both eyes   . Arthritis   . Chicken pox   . Diverticulitis   . Urine incontinence     Past Surgical History  Procedure Laterality Date  . Combined hysterectomy abdominal w/ a&p repair / oophorectomy    . Tonsillectomy  1946  . Appendectomy  1948  . Cholecystectomy  2014  . Vaginal hysterectomy  1968  . Total abdominal hysterectomy    . Dilation and curettage of uterus    . Breast biopsy Bilateral     "both were fine"  . Cataract extraction w/ intraocular lens implant Left   . Bladder surgery  1995    Current Outpatient Prescriptions  Medication Sig Dispense Refill  . aspirin EC 81 MG tablet Take 81 mg by mouth daily.      . bimatoprost (LUMIGAN) 0.01 % SOLN Place 1 drop into both eyes at bedtime.      . cholecalciferol (VITAMIN D) 1000 UNITS tablet Take 1,000 Units by mouth daily with lunch.      . cyclobenzaprine (FLEXERIL) 5 MG tablet Take 0.5 tablets (2.5 mg total) by mouth 2 (two) times daily as needed for muscle spasms.  30  tablet  1  . estrogens, conjugated, (PREMARIN) 0.625 MG tablet Take 0.3125 mg by mouth daily with supper.      Marland Kitchen ibuprofen (ADVIL,MOTRIN) 200 MG tablet Take 200-400 mg by mouth every 4 (four) hours as needed (pain).      . mirtazapine (REMERON) 15 MG tablet Take 0.5 tablets (7.5 mg total) by mouth at bedtime.  30 tablet  1  . Multiple Minerals-Vitamins (CALCIUM CITRATE PLUS PO) Take by mouth.      . Multiple Vitamin (MULTIVITAMIN WITH MINERALS) TABS tablet Take 1 tablet by mouth daily with lunch. Centrum      . Multiple Vitamins-Minerals (PRESERVISION AREDS) CAPS Take 1 capsule by mouth 2 (two) times daily.      . nitrofurantoin, macrocrystal-monohydrate, (MACROBID) 100 MG capsule Take 100 mg by mouth at bedtime. Continuous course      . ondansetron (ZOFRAN-ODT) 8 MG disintegrating tablet Take 1 tablet (8 mg total) by mouth every 8 (eight) hours as needed for nausea.  20 tablet  3  . timolol (BETIMOL) 0.5 % ophthalmic solution Place 1 drop into both eyes daily.      . timolol (TIMOPTIC-XR) 0.5 % ophthalmic gel-forming       .  tobramycin-dexamethasone (TOBRADEX) ophthalmic solution       . traMADol (ULTRAM) 50 MG tablet Take 0.5 tablets (25 mg total) by mouth every 12 (twelve) hours as needed.  30 tablet  0   No current facility-administered medications for this visit.    Allergies  Allergen Reactions  . Codeine Other (See Comments)    Passed out    History   Social History  . Marital Status: Widowed    Spouse Name: N/A    Number of Children: 4  . Years of Education: 12   Occupational History  . Retired-district Network engineer    Social History Main Topics  . Smoking status: Never Smoker   . Smokeless tobacco: Never Used  . Alcohol Use: No  . Drug Use: No  . Sexual Activity: No   Other Topics Concern  . Not on file   Social History Narrative  . No narrative on file    Family History  Problem Relation Age of Onset  . Stroke Father   . Cancer - Other Sister     Breast  .  Cancer - Other Brother     Throat  . Cancer - Other Sister     Throat  . Cancer - Other      Brain    Review of Systems:  As stated in the HPI and otherwise negative.   BP 142/70  Pulse 70  Ht 5\' 4"  (1.626 m)  Wt 96 lb (43.545 kg)  BMI 16.47 kg/m2  Physical Examination: General: Well developed, well nourished, NAD HEENT: OP clear, mucus membranes moist SKIN: warm, dry. No rashes. Neuro: No focal deficits Musculoskeletal: Muscle strength 5/5 all ext Psychiatric: Mood and affect normal Neck: No JVD, no carotid bruits, no thyromegaly, no lymphadenopathy. Lungs:Clear bilaterally, no wheezes, rhonci, crackles Cardiovascular: Regular rate and rhythm. No murmurs, gallops or rubs. Abdomen:Soft. Bowel sounds present. Non-tender.  Extremities: No lower extremity edema. Pulses are 2 + in the bilateral DP/PT.  EKG: NSR, rate 70 bpm. LAE. LVH. Poor R wave progression precordial leads.   Assessment and Plan:   1, SVT: She has documented SVT on cardiac monitor. Cannot exclude heart block. Symptoms are rare and in fact she has had no symptoms for the last three weeks. I have discussed addition of a beta blocker or Cardizem but at this time, will not add any therapy as there is concern that she could have high grade AV block with addition these medications. Will arrange echo to assess LVEF. If she has recurrent symptoms, will have to consider addition of medical therapy. I have discussed tachy/brady syndrome with the patient and her daughter. I also discussed the potential need for a pacemaker if we have to add medical therapy for SVT.

## 2013-05-15 ENCOUNTER — Ambulatory Visit: Payer: Medicare Other | Admitting: Endocrinology

## 2013-05-15 ENCOUNTER — Other Ambulatory Visit (INDEPENDENT_AMBULATORY_CARE_PROVIDER_SITE_OTHER): Payer: Medicare Other

## 2013-05-15 ENCOUNTER — Other Ambulatory Visit: Payer: Self-pay | Admitting: Internal Medicine

## 2013-05-15 DIAGNOSIS — E0781 Sick-euthyroid syndrome: Secondary | ICD-10-CM

## 2013-05-15 DIAGNOSIS — I471 Supraventricular tachycardia, unspecified: Secondary | ICD-10-CM

## 2013-05-15 DIAGNOSIS — I498 Other specified cardiac arrhythmias: Secondary | ICD-10-CM

## 2013-05-15 LAB — T4, FREE: Free T4: 0.84 ng/dL (ref 0.60–1.60)

## 2013-05-16 LAB — T4: T4, Total: 11 ug/dL (ref 5.0–12.5)

## 2013-05-16 LAB — THYROXINE BINDING GLOBULIN: TBG: 26.1 ug/mL (ref 13.5–30.9)

## 2013-05-22 DIAGNOSIS — Z961 Presence of intraocular lens: Secondary | ICD-10-CM | POA: Diagnosis not present

## 2013-05-22 DIAGNOSIS — H2510 Age-related nuclear cataract, unspecified eye: Secondary | ICD-10-CM | POA: Diagnosis not present

## 2013-05-29 ENCOUNTER — Ambulatory Visit (HOSPITAL_COMMUNITY): Payer: Medicare Other | Attending: Cardiovascular Disease | Admitting: Cardiology

## 2013-05-29 DIAGNOSIS — I359 Nonrheumatic aortic valve disorder, unspecified: Secondary | ICD-10-CM | POA: Diagnosis not present

## 2013-05-29 DIAGNOSIS — I471 Supraventricular tachycardia: Secondary | ICD-10-CM

## 2013-05-29 DIAGNOSIS — I498 Other specified cardiac arrhythmias: Secondary | ICD-10-CM | POA: Diagnosis not present

## 2013-05-29 DIAGNOSIS — I079 Rheumatic tricuspid valve disease, unspecified: Secondary | ICD-10-CM | POA: Diagnosis not present

## 2013-05-29 DIAGNOSIS — I059 Rheumatic mitral valve disease, unspecified: Secondary | ICD-10-CM | POA: Diagnosis not present

## 2013-05-29 NOTE — Progress Notes (Signed)
Echo performed. 

## 2013-06-03 DIAGNOSIS — Z1231 Encounter for screening mammogram for malignant neoplasm of breast: Secondary | ICD-10-CM | POA: Diagnosis not present

## 2013-06-07 DIAGNOSIS — H353190 Nonexudative age-related macular degeneration, unspecified eye, stage unspecified: Secondary | ICD-10-CM | POA: Diagnosis not present

## 2013-06-18 DIAGNOSIS — T07XXXA Unspecified multiple injuries, initial encounter: Secondary | ICD-10-CM | POA: Diagnosis not present

## 2013-06-18 DIAGNOSIS — S61209A Unspecified open wound of unspecified finger without damage to nail, initial encounter: Secondary | ICD-10-CM | POA: Diagnosis not present

## 2013-06-18 DIAGNOSIS — M25559 Pain in unspecified hip: Secondary | ICD-10-CM | POA: Diagnosis not present

## 2013-06-18 DIAGNOSIS — R296 Repeated falls: Secondary | ICD-10-CM | POA: Diagnosis not present

## 2013-06-28 ENCOUNTER — Ambulatory Visit (INDEPENDENT_AMBULATORY_CARE_PROVIDER_SITE_OTHER)
Admission: RE | Admit: 2013-06-28 | Discharge: 2013-06-28 | Disposition: A | Discharge status: Home / Self Care | Payer: Medicare Other | Source: Ambulatory Visit | Attending: Internal Medicine | Admitting: Internal Medicine

## 2013-06-28 ENCOUNTER — Ambulatory Visit (INDEPENDENT_AMBULATORY_CARE_PROVIDER_SITE_OTHER): Payer: Medicare Other | Admitting: Internal Medicine

## 2013-06-28 ENCOUNTER — Encounter: Payer: Self-pay | Admitting: Internal Medicine

## 2013-06-28 VITALS — BP 140/58 | HR 80 | Temp 98.2°F | Resp 16 | Ht 64.0 in | Wt 96.4 lb

## 2013-06-28 DIAGNOSIS — IMO0001 Reserved for inherently not codable concepts without codable children: Secondary | ICD-10-CM | POA: Diagnosis not present

## 2013-06-28 DIAGNOSIS — I10 Essential (primary) hypertension: Secondary | ICD-10-CM | POA: Diagnosis not present

## 2013-06-28 DIAGNOSIS — S79911S Unspecified injury of right hip, sequela: Secondary | ICD-10-CM

## 2013-06-28 DIAGNOSIS — M25559 Pain in unspecified hip: Secondary | ICD-10-CM | POA: Diagnosis not present

## 2013-06-28 DIAGNOSIS — S79911A Unspecified injury of right hip, initial encounter: Secondary | ICD-10-CM | POA: Insufficient documentation

## 2013-06-28 DIAGNOSIS — S79919A Unspecified injury of unspecified hip, initial encounter: Secondary | ICD-10-CM | POA: Diagnosis not present

## 2013-06-28 DIAGNOSIS — S79929A Unspecified injury of unspecified thigh, initial encounter: Secondary | ICD-10-CM | POA: Diagnosis not present

## 2013-06-28 NOTE — Patient Instructions (Signed)
Hip Pain The hips join the upper legs to the lower pelvis. The bones, cartilage, tendons, and muscles of the hip joint perform a lot of work each day holding your body weight and allowing you to move around. Hip pain is a common symptom. It can range from a minor ache to severe pain on 1 or both hips. Pain may be felt on the inside of the hip joint near the groin, or the outside near the buttocks and upper thigh. There may be swelling or stiffness as well. It occurs more often when a person walks or performs activity. There are many reasons hip pain can develop. CAUSES  It is important to work with your caregiver to identify the cause since many conditions can impact the bones, cartilage, muscles, and tendons of the hips. Causes for hip pain include:  Broken (fractured) bones.  Separation of the thighbone from the hip socket (dislocation).  Torn cartilage of the hip joint.  Swelling (inflammation) of a tendon (tendonitis), the sac within the hip joint (bursitis), or a joint.  A weakening in the abdominal wall (hernia), affecting the nerves to the hip.  Arthritis in the hip joint or lining of the hip joint.  Pinched nerves in the back, hip, or upper thigh.  A bulging disc in the spine (herniated disc).  Rarely, bone infection or cancer. DIAGNOSIS  The location of your hip pain will help your caregiver understand what may be causing the pain. A diagnosis is based on your medical history, your symptoms, results from your physical exam, and results from diagnostic tests. Diagnostic tests may include X-ray exams, a computerized magnetic scan (magnetic resonance imaging, MRI), or bone scan. TREATMENT  Treatment will depend on the cause of your hip pain. Treatment may include:  Limiting activities and resting until symptoms improve.  Crutches or other walking supports (a cane or brace).  Ice, elevation, and compression.  Physical therapy or home exercises.  Shoe inserts or special  shoes.  Losing weight.  Medications to reduce pain.  Undergoing surgery. HOME CARE INSTRUCTIONS   Only take over-the-counter or prescription medicines for pain, discomfort, or fever as directed by your caregiver.  Put ice on the injured area:  Put ice in a plastic bag.  Place a towel between your skin and the bag.  Leave the ice on for 15-20 minutes at a time, 03-04 times a day.  Keep your leg raised (elevated) when possible to lessen swelling.  Avoid activities that cause pain.  Follow specific exercises as directed by your caregiver.  Sleep with a pillow between your legs on your most comfortable side.  Record how often you have hip pain, the location of the pain, and what it feels like. This information may be helpful to you and your caregiver.  Ask your caregiver about returning to work or sports and whether you should drive.  Follow up with your caregiver for further exams, therapy, or testing as directed. SEEK MEDICAL CARE IF:   Your pain or swelling continues or worsens after 1 week.  You are feeling unwell or have chills.  You have increasing difficulty with walking.  You have a loss of sensation or other new symptoms.  You have questions or concerns. SEEK IMMEDIATE MEDICAL CARE IF:   You cannot put weight on the affected hip.  You have fallen.  You have a sudden increase in pain and swelling in your hip.  You have a fever. MAKE SURE YOU:   Understand these instructions.  Will watch your condition. °· Will get help right away if you are not doing well or get worse. °Document Released: 06/09/2009 Document Revised: 03/14/2011 Document Reviewed: 06/09/2009 °[employer]® Patient Information ©2015 [employer]. This information is not intended to replace advice given to you by your health care provider. Make sure you discuss any questions you have with your health care provider. ° °

## 2013-06-28 NOTE — Progress Notes (Signed)
Subjective:    Patient ID: Julie Silva, female    DOB: January 01, 1928, 80 y.o.   MRN: [id number]  Injury The incident occurred more than 1 week ago. The incident occurred at home. The injury mechanism was a fall. Leg injury location: right hip area. The pain is mild. It is unlikely that a foreign body is present. Pertinent negatives include no abdominal pain, chest pain, coughing, difficulty breathing, headaches, hearing loss, inability to bear weight, light-headedness, loss of consciousness, memory loss, nausea, neck pain, numbness, seizures, tingling, visual disturbance, vomiting or weakness. Her tetanus status is UTD.  Wound Check She was originally treated 10 to 14 days ago. Previous treatment included laceration repair. Her temperature was unmeasured prior to arrival. There has been no drainage from the wound. There is no redness present. There is no swelling present. The pain has no pain. She has no difficulty moving the affected extremity or digit.      Review of Systems  Constitutional: Negative.  Negative for fever, chills, diaphoresis, appetite change and fatigue.  HENT: Negative.  Negative for hearing loss.   Eyes: Negative.  Negative for visual disturbance.  Respiratory: Negative.  Negative for cough, choking, chest tightness, shortness of breath and stridor.   Cardiovascular: Negative.  Negative for chest pain, palpitations and leg swelling.  Gastrointestinal: Negative.  Negative for nausea, vomiting, abdominal pain, diarrhea, constipation and blood in stool.  Endocrine: Negative.   Genitourinary: Negative.   Musculoskeletal: Positive for arthralgias and gait problem (limps, uses a cane). Negative for back pain, joint swelling, myalgias and neck pain.  Skin: Positive for wound. Negative for color change, pallor and rash.       6 sutures removed from right thumb, the wound looks excellent  Allergic/Immunologic: Negative.   Neurological: Negative.  Negative for tingling, seizures,  loss of consciousness, weakness, light-headedness, numbness and headaches.  Hematological: Negative.  Negative for adenopathy. Does not bruise/bleed easily.  Psychiatric/Behavioral: Negative.  Negative for memory loss.       Objective:   Physical Exam  Vitals reviewed. Constitutional: She is oriented to person, place, and time. She appears well-developed and well-nourished. No distress.  HENT:  Head: Normocephalic and atraumatic.  Mouth/Throat: Oropharynx is clear and moist. No oropharyngeal exudate.  Eyes: Conjunctivae are normal. Right eye exhibits no discharge. Left eye exhibits no discharge. No scleral icterus.  Neck: Normal range of motion. Neck supple. No JVD present. No tracheal deviation present. No thyromegaly present.  Cardiovascular: Normal rate, regular rhythm, normal heart sounds and intact distal pulses.  Exam reveals no gallop and no friction rub.   No murmur heard. Pulmonary/Chest: Effort normal and breath sounds normal. No stridor. No respiratory distress. She has no wheezes. She has no rales. She exhibits no tenderness.  Abdominal: Soft. Bowel sounds are normal. She exhibits no distension and no mass. There is no tenderness. There is no rebound and no guarding.  Musculoskeletal: She exhibits no edema.       Right hip: She exhibits decreased range of motion, tenderness and bony tenderness. She exhibits normal strength, no swelling, no crepitus, no deformity and no laceration.       Hands: Lymphadenopathy:    She has no cervical adenopathy.  Neurological: She is oriented to person, place, and time.  Skin: Skin is warm and dry. No rash noted. She is not diaphoretic. No erythema.     Lab Results  Component Value Date   WBC 6.9 04/02/2013   HGB 13.5 04/02/2013   HCT  40.6 04/02/2013   PLT 222.0 04/02/2013   GLUCOSE 84 05/07/2013   CHOL 172 05/07/2013   TRIG 69.0 05/07/2013   HDL 72.80 05/07/2013   LDLCALC 85 05/07/2013   ALT 18 04/02/2013   AST 33 04/02/2013   NA 140 05/07/2013    K 3.9 05/07/2013   CL 103 05/07/2013   CREATININE 0.7 05/07/2013   BUN 13 05/07/2013   CO2 28 05/07/2013   TSH 2.72 05/07/2013   HGBA1C 5.5 05/07/2013       Assessment & Plan:

## 2013-06-28 NOTE — Assessment & Plan Note (Signed)
This may be a contusion, she will cont motrin for the pain Will check an xray today to look for fracture

## 2013-06-28 NOTE — Assessment & Plan Note (Signed)
Her BP is well controlled 

## 2013-06-28 NOTE — Progress Notes (Signed)
Pre visit review using our clinic review tool, if applicable. No additional management support is needed unless otherwise documented below in the visit note. 

## 2013-07-02 ENCOUNTER — Encounter: Payer: Self-pay | Admitting: Cardiovascular Disease

## 2013-07-02 ENCOUNTER — Ambulatory Visit (INDEPENDENT_AMBULATORY_CARE_PROVIDER_SITE_OTHER): Payer: Medicare Other | Admitting: Cardiovascular Disease

## 2013-07-02 VITALS — BP 142/64 | HR 95 | Resp 12 | Ht 64.0 in | Wt 96.0 lb

## 2013-07-02 DIAGNOSIS — I471 Supraventricular tachycardia: Secondary | ICD-10-CM

## 2013-07-02 DIAGNOSIS — I441 Atrioventricular block, second degree: Secondary | ICD-10-CM

## 2013-07-02 DIAGNOSIS — I498 Other specified cardiac arrhythmias: Secondary | ICD-10-CM

## 2013-07-02 NOTE — Patient Instructions (Signed)
Your physician wants you to follow-up in:  6 months. You will receive a reminder letter in the mail two months in advance. If you don't receive a letter, please call our office to schedule the follow-up appointment.   

## 2013-07-02 NOTE — Progress Notes (Signed)
Marland KitchenMarland KitchenMarland Kitchen....    History of Present Illness: 80 yo female with history of migraines, anemia who is here today for cardiac follow up. I saw her as a new patient 05/14/13 for evaluation of palpitations. She had several episodes where she felt weak and felt her heart racing. She had a cardiac monitor placed 03/06/13-04/05/13 by primary care and had several runs of SVT. She felt a "funny" feeling in her head but no dizziness. No recurrence over the last month. No near syncope or syncope. No chest pain or SOB. We discussed addition of medical therapy but decided to follow her symptoms. Echo 05/29/13 with normal LV function, mild AI, mild MR.   She is here today for follow up. She is feeling well. No dizziness, near syncope or syncope. She fell after tripping several weeks ago and has bruising of her right thigh. No fractures. No awareness of heart racing.   Primary Care Physician: Scarlette Calico  Last Lipid Profile:Lipid Panel     Component Value Date/Time   CHOL 172 05/07/2013 1045   TRIG 69.0 05/07/2013 1045   HDL 72.80 05/07/2013 1045   CHOLHDL 2 05/07/2013 1045   VLDL 13.8 05/07/2013 1045   LDLCALC 85 05/07/2013 1045     Past Medical History  Diagnosis Date  . Anemia   . Bleeds easily     "father was a hemophiliac"   . History of blood transfusion 1957    "lots; most related to my periods"  . Migraines     "years ago" (02/04/2013)  . Glaucoma of both eyes   . Arthritis   . Chicken pox   . Diverticulitis   . Urine incontinence     Past Surgical History  Procedure Laterality Date  . Combined hysterectomy abdominal w/ a&p repair / oophorectomy    . Tonsillectomy  1946  . Appendectomy  1948  . Cholecystectomy  2014  . Vaginal hysterectomy  1968  . Total abdominal hysterectomy    . Dilation and curettage of uterus    . Breast biopsy Bilateral     "both were fine"  . Cataract extraction w/ intraocular lens implant Left   . Bladder surgery  1995    Current Outpatient Prescriptions  Medication Sig  Dispense Refill  . aspirin EC 81 MG tablet Take 81 mg by mouth daily.      . bimatoprost (LUMIGAN) 0.01 % SOLN Place 1 drop into both eyes at bedtime.      . cholecalciferol (VITAMIN D) 1000 UNITS tablet Take 1,000 Units by mouth daily with lunch.      . cyclobenzaprine (FLEXERIL) 5 MG tablet Take 0.5 tablets (2.5 mg total) by mouth 2 (two) times daily as needed for muscle spasms.  30 tablet  1  . estrogens, conjugated, (PREMARIN) 0.625 MG tablet Take 0.3125 mg by mouth daily with supper.      Marland Kitchen ibuprofen (ADVIL,MOTRIN) 200 MG tablet Take 200-400 mg by mouth every 4 (four) hours as needed (pain).      . mirtazapine (REMERON) 15 MG tablet Take 0.5 tablets (7.5 mg total) by mouth at bedtime.  30 tablet  1  . Multiple Minerals-Vitamins (CALCIUM CITRATE PLUS PO) Take by mouth.      . Multiple Vitamin (MULTIVITAMIN WITH MINERALS) TABS tablet Take 1 tablet by mouth daily with lunch. Centrum      . Multiple Vitamins-Minerals (PRESERVISION AREDS) CAPS Take 1 capsule by mouth 2 (two) times daily.      . nitrofurantoin, macrocrystal-monohydrate, (MACROBID) 100 MG  capsule Take 100 mg by mouth at bedtime. Continuous course      . ondansetron (ZOFRAN-ODT) 8 MG disintegrating tablet Take 1 tablet (8 mg total) by mouth every 8 (eight) hours as needed for nausea.  20 tablet  3  . timolol (BETIMOL) 0.5 % ophthalmic solution Place 1 drop into both eyes daily.      . timolol (TIMOPTIC-XR) 0.5 % ophthalmic gel-forming       . tobramycin-dexamethasone (TOBRADEX) ophthalmic solution       . traMADol (ULTRAM) 50 MG tablet Take 0.5 tablets (25 mg total) by mouth every 12 (twelve) hours as needed.  30 tablet  0   No current facility-administered medications for this visit.    Allergies  Allergen Reactions  . Codeine Other (See Comments)    Passed out    History   Social History  . Marital Status: Widowed    Spouse Name: N/A    Number of Children: 4  . Years of Education: 12   Occupational History  .  Retired-district Network engineer    Social History Main Topics  . Smoking status: Never Smoker   . Smokeless tobacco: Never Used  . Alcohol Use: No  . Drug Use: No  . Sexual Activity: No   Other Topics Concern  . Not on file   Social History Narrative  . No narrative on file    Family History  Problem Relation Age of Onset  . Stroke Father   . Cancer - Other Sister     Breast  . Cancer - Other Brother     Throat  . Cancer - Other Sister     Throat  . Cancer - Other      Brain    Review of Systems:  As stated in the HPI and otherwise negative.   BP 142/64  Pulse 95  Resp 12  Ht 5\' 4"  (1.626 m)  Wt 96 lb (43.545 kg)  BMI 16.47 kg/m2  Physical Examination: General: Well developed, well nourished, NAD HEENT: OP clear, mucus membranes moist SKIN: warm, dry. No rashes. Neuro: No focal deficits Musculoskeletal: Muscle strength 5/5 all ext Psychiatric: Mood and affect normal Neck: No JVD, no carotid bruits, no thyromegaly, no lymphadenopathy. Lungs:Clear bilaterally, no wheezes, rhonci, crackles Cardiovascular: Regular rate and rhythm. No murmurs, gallops or rubs. Abdomen:Soft. Bowel sounds present. Non-tender.  Extremities: No lower extremity edema. Pulses are 2 + in the bilateral DP/PT.  Echo 05/29/13: Left ventricle: The cavity size was normal. Systolic function was normal. The estimated ejection fraction was in the range of 55% to 60%. Wall motion was normal; there were no regional wall motion abnormalities. Left ventricular diastolic function parameters were normal. - Aortic valve: There was mild regurgitation. - Mitral valve: Mild, late systolicprolapse. There was mild regurgitation. - Atrial septum: No defect or patent foramen ovale was identified.  Assessment and Plan:   1, SVT: She has documented SVT on cardiac monitor. Cannot exclude heart block. Symptoms are rare and in fact she has had no symptoms for the last 2 months. I have discussed addition of a beta  blocker or Cardizem but at this time, will not add any therapy as there is concern that she could have high grade AV block with addition these medications. Echo is normal. If she has symptoms, will have to consider addition of medical therapy. I have discussed tachy/brady syndrome with the patient and her daughter. I also discussed the potential need for a pacemaker if we have to add  medical therapy for SVT.    2. AV block: She appears to have periods of second degree AV block on her event monitor. She has no dizziness, near syncope or syncope. These were rare on the event monitor. Will follow.

## 2013-07-31 ENCOUNTER — Ambulatory Visit (INDEPENDENT_AMBULATORY_CARE_PROVIDER_SITE_OTHER): Payer: Medicare Other | Admitting: Internal Medicine

## 2013-07-31 ENCOUNTER — Encounter: Payer: Self-pay | Admitting: Internal Medicine

## 2013-07-31 VITALS — BP 146/68 | HR 84 | Temp 97.5°F | Resp 16 | Ht 64.0 in | Wt 96.1 lb

## 2013-07-31 DIAGNOSIS — K2100 Gastro-esophageal reflux disease with esophagitis, without bleeding: Secondary | ICD-10-CM | POA: Insufficient documentation

## 2013-07-31 DIAGNOSIS — R1310 Dysphagia, unspecified: Secondary | ICD-10-CM

## 2013-07-31 MED ORDER — RANITIDINE HCL 300 MG PO TABS: 300.0000 mg | ORAL_TABLET | Freq: Every day | ORAL | Status: DC

## 2013-07-31 NOTE — Progress Notes (Signed)
   Subjective:    Patient ID: Julie Silva, female    DOB: [birth year], 80 y.o.   MRN: [id number]  Gastrophageal Reflux She complains of belching, dysphagia (pills get stuck), heartburn and a hoarse voice. She reports no abdominal pain, no chest pain, no choking, no coughing, no early satiety, no globus sensation, no nausea, no sore throat, no stridor, no tooth decay, no water brash or no wheezing. This is a chronic problem. The current episode started more than 1 year ago. The problem occurs frequently. The problem has been gradually worsening. The heartburn duration is several minutes. The heartburn is located in the substernum. The heartburn is of moderate intensity. The heartburn does not wake her from sleep. The heartburn does not limit her activity. The heartburn doesn't change with position. Nothing aggravates the symptoms. Pertinent negatives include no anemia, fatigue, melena, muscle weakness, orthopnea or weight loss. She has tried an antacid for the symptoms. The treatment provided mild relief.      Review of Systems  Constitutional: Negative.  Negative for fever, chills, weight loss, diaphoresis, activity change, appetite change, fatigue and unexpected weight change.  HENT: Positive for hearing loss, hoarse voice and trouble swallowing. Negative for sore throat, tinnitus and voice change.   Eyes: Negative.   Respiratory: Negative.  Negative for cough, choking and wheezing.   Cardiovascular: Negative.  Negative for chest pain, palpitations and leg swelling.  Gastrointestinal: Positive for heartburn and dysphagia (pills get stuck). Negative for nausea, vomiting, abdominal pain, diarrhea, blood in stool, melena, abdominal distention, anal bleeding and rectal pain.  Endocrine: Negative.   Genitourinary: Negative.   Musculoskeletal: Negative.  Negative for arthralgias, back pain, gait problem, joint swelling, muscle weakness, myalgias, neck pain and neck stiffness.  Skin: Negative.     Allergic/Immunologic: Negative.   Neurological: Negative.   Hematological: Negative.  Negative for adenopathy. Does not bruise/bleed easily.  Psychiatric/Behavioral: Negative.        Objective:   Physical Exam  Vitals reviewed. Constitutional: She is oriented to person, place, and time. She appears well-developed and well-nourished. No distress.  HENT:  Head: Normocephalic and atraumatic.  Mouth/Throat: Oropharynx is clear and moist. No oropharyngeal exudate.  Eyes: Conjunctivae are normal. Right eye exhibits no discharge. Left eye exhibits no discharge. No scleral icterus.  Neck: Normal range of motion. Neck supple. No JVD present. No tracheal deviation present. No thyromegaly present.  Cardiovascular: Normal rate, regular rhythm and intact distal pulses.  Exam reveals no gallop and no friction rub.   No murmur heard. Pulmonary/Chest: Effort normal and breath sounds normal. No stridor. No respiratory distress. She has no wheezes. She has no rales. She exhibits no tenderness.  Abdominal: Soft. Bowel sounds are normal. She exhibits no distension and no mass. There is no tenderness. There is no rebound and no guarding.  Musculoskeletal: Normal range of motion. She exhibits no edema and no tenderness.  Lymphadenopathy:    She has no cervical adenopathy.  Neurological: She is oriented to person, place, and time.  Skin: Skin is warm and dry. No rash noted. She is not diaphoretic. No erythema. No pallor.          Assessment & Plan:

## 2013-07-31 NOTE — Progress Notes (Signed)
Pre visit review using our clinic review tool, if applicable. No additional management support is needed unless otherwise documented below in the visit note. 

## 2013-07-31 NOTE — Patient Instructions (Signed)
Gastroesophageal Reflux Disease, Adult Gastroesophageal reflux disease (GERD) happens when acid from your stomach flows up into the esophagus. When acid comes in contact with the esophagus, the acid causes soreness (inflammation) in the esophagus. Over time, GERD may create small holes (ulcers) in the lining of the esophagus. CAUSES   Increased body weight. This puts pressure on the stomach, making acid rise from the stomach into the esophagus.  Smoking. This increases acid production in the stomach.  Drinking alcohol. This causes decreased pressure in the lower esophageal sphincter (valve or ring of muscle between the esophagus and stomach), allowing acid from the stomach into the esophagus.  Late evening meals and a full stomach. This increases pressure and acid production in the stomach.  A malformed lower esophageal sphincter. Sometimes, no cause is found. SYMPTOMS   Burning pain in the lower part of the mid-chest behind the breastbone and in the mid-stomach area. This may occur twice a week or more often.  Trouble swallowing.  Sore throat.  Dry cough.  Asthma-like symptoms including chest tightness, shortness of breath, or wheezing. DIAGNOSIS  Your caregiver may be able to diagnose GERD based on your symptoms. In some cases, X-rays and other tests may be done to check for complications or to check the condition of your stomach and esophagus. TREATMENT  Your caregiver may recommend over-the-counter or prescription medicines to help decrease acid production. Ask your caregiver before starting or adding any new medicines.  HOME CARE INSTRUCTIONS   Change the factors that you can control. Ask your caregiver for guidance concerning weight loss, quitting smoking, and alcohol consumption.  Avoid foods and drinks that make your symptoms worse, such as:  Caffeine or alcoholic drinks.  Chocolate.  Peppermint or mint flavorings.  Garlic and onions.  Spicy foods.  Citrus fruits,  such as oranges, lemons, or limes.  Tomato-based foods such as sauce, chili, salsa, and pizza.  Fried and fatty foods.  Avoid lying down for the 3 hours prior to your bedtime or prior to taking a nap.  Eat small, frequent meals instead of large meals.  Wear loose-fitting clothing. Do not wear anything tight around your waist that causes pressure on your stomach.  Raise the head of your bed 6 to 8 inches with wood blocks to help you sleep. Extra pillows will not help.  Only take over-the-counter or prescription medicines for pain, discomfort, or fever as directed by your caregiver.  Do not take aspirin, ibuprofen, or other nonsteroidal anti-inflammatory drugs (NSAIDs). SEEK IMMEDIATE MEDICAL CARE IF:   You have pain in your arms, neck, jaw, teeth, or back.  Your pain increases or changes in intensity or duration.  You develop nausea, vomiting, or sweating (diaphoresis).  You develop shortness of breath, or you faint.  Your vomit is green, yellow, black, or looks like coffee grounds or blood.  Your stool is red, bloody, or black. These symptoms could be signs of other problems, such as heart disease, gastric bleeding, or esophageal bleeding. MAKE SURE YOU:   Understand these instructions.  Will watch your condition.  Will get help right away if you are not doing well or get worse. Document Released: 09/29/2004 Document Revised: 03/14/2011 Document Reviewed: 07/09/2010 Exit[employer] Patient Information 2015 [employer]. This information is not intended to replace advice given to you by your health care provider. Make sure you discuss any questions you have with your health care provider.  

## 2013-08-01 ENCOUNTER — Encounter: Payer: Self-pay | Admitting: Internal Medicine

## 2013-08-01 NOTE — Assessment & Plan Note (Signed)
I am concerned that she may have some UGI pathology so I have asked her to see GI for further evaluation

## 2013-08-01 NOTE — Assessment & Plan Note (Signed)
Will start zantac for this She has some alarming symptoms so I have referred to GI to see if an upper endoscopy is indicated

## 2013-08-28 DIAGNOSIS — H4010X0 Unspecified open-angle glaucoma, stage unspecified: Secondary | ICD-10-CM | POA: Diagnosis not present

## 2013-09-05 DIAGNOSIS — R1310 Dysphagia, unspecified: Secondary | ICD-10-CM | POA: Diagnosis not present

## 2013-09-05 DIAGNOSIS — K219 Gastro-esophageal reflux disease without esophagitis: Secondary | ICD-10-CM | POA: Diagnosis not present

## 2013-09-12 ENCOUNTER — Telehealth: Payer: Self-pay | Admitting: Internal Medicine

## 2013-09-12 DIAGNOSIS — L570 Actinic keratosis: Secondary | ICD-10-CM | POA: Diagnosis not present

## 2013-09-12 DIAGNOSIS — L821 Other seborrheic keratosis: Secondary | ICD-10-CM | POA: Diagnosis not present

## 2013-09-12 DIAGNOSIS — Z85828 Personal history of other malignant neoplasm of skin: Secondary | ICD-10-CM | POA: Diagnosis not present

## 2013-09-12 NOTE — Telephone Encounter (Signed)
Forwarding msg to [facility] to contact pt....Julie Silva

## 2013-09-12 NOTE — Telephone Encounter (Signed)
Julie Silva has already made appt for flu shot...Julie Silva

## 2013-09-12 NOTE — Telephone Encounter (Signed)
Caller: Niema/Patient; Phone: [phone]; Reason for Call: Requesting flu shot for seniors over age 78. Transferred to [place]/office scheduler for flu shot appointment.

## 2013-09-17 ENCOUNTER — Ambulatory Visit: Payer: Medicare Other

## 2013-10-03 ENCOUNTER — Ambulatory Visit (INDEPENDENT_AMBULATORY_CARE_PROVIDER_SITE_OTHER): Payer: Medicare Other

## 2013-10-03 DIAGNOSIS — Z23 Encounter for immunization: Secondary | ICD-10-CM

## 2013-10-23 DIAGNOSIS — R12 Heartburn: Secondary | ICD-10-CM | POA: Diagnosis not present

## 2013-10-23 DIAGNOSIS — K219 Gastro-esophageal reflux disease without esophagitis: Secondary | ICD-10-CM | POA: Diagnosis not present

## 2013-10-25 ENCOUNTER — Encounter: Payer: Self-pay | Admitting: Internal Medicine

## 2013-10-30 ENCOUNTER — Ambulatory Visit: Payer: Medicare Other | Admitting: Internal Medicine

## 2013-11-06 ENCOUNTER — Telehealth: Payer: Self-pay | Admitting: Cardiovascular Disease

## 2013-11-06 ENCOUNTER — Encounter: Payer: Medicare Other | Admitting: Cardiovascular Disease

## 2013-11-06 NOTE — Telephone Encounter (Signed)
Received message that pt's daughter called and said pt was feeling better and wanted to cancel appt scheduled for today.  Appt cancelled.

## 2013-11-06 NOTE — Telephone Encounter (Signed)
I can see her today. cdm

## 2013-11-06 NOTE — Progress Notes (Signed)
No show

## 2013-11-06 NOTE — Telephone Encounter (Signed)
Spoke with pt's daughter [name]). She reports pt called her about 5 AM and stated she felt heart beating fast and she felt jittery.  [name] went to check on pt and heart rate was irregular with bursts to 150-160 which lasted 5-6 seconds. Daughter does not know how long heart rate may have been up prior to her arrival.  She reports pt felt hungry and got up to eat breakfast around 6:30. Heart rate went back to regular and about 85 at that time.  Pt felt very tired and wiped out and wanted to go back to bed. Daughter left after pt returned to bed and has not talked with her since.  Will review with Dr. Angelena Form

## 2013-11-06 NOTE — Telephone Encounter (Signed)
New message     Pt thinks she had an AFIB episode at 4:30 am-----her pulse was 150-160 but now it is about 85.  Should she adjust her medications or would you like to see her?

## 2013-11-06 NOTE — Telephone Encounter (Signed)
Reviewed with Dr. Angelena Form and if isolated incident and pt is feeling better she can continue to monitor.  He can see pt today in office if pt is continuing to have symptoms or pt/family feel pt needs to be seen today.  I spoke with pt's daughter. She reports she spoke with pt who states she is feeling better and heart rate and rhythm are normal.  Daughter would like pt seen this afternoon but is not sure pt will agree to visit.  Appt scheduled for pt to see Dr. Angelena Form at 3:00 today.  She will call us back if pt does not want to come in.

## 2013-11-12 ENCOUNTER — Encounter: Payer: Self-pay | Admitting: Internal Medicine

## 2013-11-26 DIAGNOSIS — Z79890 Hormone replacement therapy (postmenopausal): Secondary | ICD-10-CM | POA: Diagnosis not present

## 2013-11-26 DIAGNOSIS — Z803 Family history of malignant neoplasm of breast: Secondary | ICD-10-CM | POA: Diagnosis not present

## 2013-11-26 DIAGNOSIS — N390 Urinary tract infection, site not specified: Secondary | ICD-10-CM | POA: Diagnosis not present

## 2013-11-26 DIAGNOSIS — R339 Retention of urine, unspecified: Secondary | ICD-10-CM | POA: Diagnosis not present

## 2013-11-26 DIAGNOSIS — M8580 Other specified disorders of bone density and structure, unspecified site: Secondary | ICD-10-CM | POA: Diagnosis not present

## 2013-12-31 ENCOUNTER — Encounter: Payer: Self-pay | Admitting: Cardiovascular Disease

## 2013-12-31 ENCOUNTER — Ambulatory Visit (INDEPENDENT_AMBULATORY_CARE_PROVIDER_SITE_OTHER): Payer: Medicare Other | Admitting: Cardiovascular Disease

## 2013-12-31 ENCOUNTER — Ambulatory Visit: Payer: Medicare Other | Admitting: Cardiovascular Disease

## 2013-12-31 VITALS — BP 120/68 | HR 76 | Ht 64.0 in | Wt 98.8 lb

## 2013-12-31 DIAGNOSIS — I471 Supraventricular tachycardia: Secondary | ICD-10-CM | POA: Diagnosis not present

## 2013-12-31 NOTE — Progress Notes (Signed)
Marland KitchenMarland KitchenMarland Kitchen....    History of Present Illness: 80 yo female with history of migraines, anemia who is here today for cardiac follow up. I saw her as a new patient 05/14/13 for evaluation of palpitations. She had several episodes where she felt weak and felt her heart racing. She had a cardiac monitor placed 03/06/13-04/05/13 by primary care and had several runs of SVT. She felt a "funny" feeling in her head but no dizziness. No recurrence over the last month. No near syncope or syncope. No chest pain or SOB. We discussed addition of medical therapy but decided to follow her symptoms. Echo 05/29/13 with normal LV function, mild AI, mild MR.   She is here today for follow up. She is feeling well. No dizziness, near syncope or syncope. No awareness of heart racing.   Primary Care Physician: Scarlette Calico  Last Lipid Profile:Lipid Panel     Component Value Date/Time   CHOL 172 05/07/2013 1045   TRIG 69.0 05/07/2013 1045   HDL 72.80 05/07/2013 1045   CHOLHDL 2 05/07/2013 1045   VLDL 13.8 05/07/2013 1045   LDLCALC 85 05/07/2013 1045     Past Medical History  Diagnosis Date  . Anemia   . Bleeds easily     "father was a hemophiliac"   . History of blood transfusion 1957    "lots; most related to my periods"  . Migraines     "years ago" (02/04/2013)  . Glaucoma of both eyes   . Arthritis   . Chicken pox   . Diverticulitis   . Urine incontinence     Past Surgical History  Procedure Laterality Date  . Combined hysterectomy abdominal w/ a&p repair / oophorectomy    . Tonsillectomy  1946  . Appendectomy  1948  . Cholecystectomy  2014  . Vaginal hysterectomy  1968  . Total abdominal hysterectomy    . Dilation and curettage of uterus    . Breast biopsy Bilateral     "both were fine"  . Cataract extraction w/ intraocular lens implant Left   . Bladder surgery  1995    Current Outpatient Prescriptions  Medication Sig Dispense Refill  . aspirin EC 81 MG tablet Take 81 mg by mouth daily.    .  bimatoprost (LUMIGAN) 0.01 % SOLN Place 1 drop into both eyes at bedtime.    . cholecalciferol (VITAMIN D) 1000 UNITS tablet Take 1,000 Units by mouth daily with lunch.    . cyclobenzaprine (FLEXERIL) 5 MG tablet Take 0.5 tablets (2.5 mg total) by mouth 2 (two) times daily as needed for muscle spasms. 30 tablet 1  . estrogens, conjugated, (PREMARIN) 0.625 MG tablet Take 0.3125 mg by mouth daily with supper.    Marland Kitchen ibuprofen (ADVIL,MOTRIN) 200 MG tablet Take 200-400 mg by mouth every 4 (four) hours as needed (pain).    . mirtazapine (REMERON) 15 MG tablet Take 0.5 tablets (7.5 mg total) by mouth at bedtime. 30 tablet 1  . Multiple Minerals-Vitamins (CALCIUM CITRATE PLUS PO) Take by mouth.    . Multiple Vitamin (MULTIVITAMIN WITH MINERALS) TABS tablet Take 1 tablet by mouth daily with lunch. Centrum    . Multiple Vitamins-Minerals (PRESERVISION AREDS) CAPS Take 1 capsule by mouth 2 (two) times daily.    . nitrofurantoin, macrocrystal-monohydrate, (MACROBID) 100 MG capsule Take 100 mg by mouth at bedtime. Continuous course    . ondansetron (ZOFRAN-ODT) 8 MG disintegrating tablet Take 1 tablet (8 mg total) by mouth every 8 (eight) hours as needed for nausea.  20 tablet 3  . ranitidine (ZANTAC) 300 MG tablet Take 1 tablet (300 mg total) by mouth at bedtime. 90 tablet 3  . timolol (BETIMOL) 0.5 % ophthalmic solution Place 1 drop into both eyes daily.    . timolol (TIMOPTIC-XR) 0.5 % ophthalmic gel-forming     . tobramycin-dexamethasone (TOBRADEX) ophthalmic solution     . traMADol (ULTRAM) 50 MG tablet Take 0.5 tablets (25 mg total) by mouth every 12 (twelve) hours as needed. 30 tablet 0   No current facility-administered medications for this visit.    Allergies  Allergen Reactions  . Codeine Other (See Comments)    Passed out    History   Social History  . Marital Status: Widowed    Spouse Name: N/A    Number of Children: 4  . Years of Education: 12   Occupational History  .  Retired-district Network engineer    Social History Main Topics  . Smoking status: Never Smoker   . Smokeless tobacco: Never Used  . Alcohol Use: No  . Drug Use: No  . Sexual Activity: No   Other Topics Concern  . Not on file   Social History Narrative    Family History  Problem Relation Age of Onset  . Stroke Father   . Cancer - Other Sister     Breast  . Cancer - Other Brother     Throat  . Cancer - Other Sister     Throat  . Cancer - Other      Brain    Review of Systems:  As stated in the HPI and otherwise negative.   BP 120/68 mmHg  Pulse 76  Ht 5\' 4"  (1.626 m)  Wt 98 lb 12.8 oz (44.815 kg)  BMI 16.95 kg/m2  SpO2 98%  Physical Examination: General: Well developed, well nourished, NAD HEENT: OP clear, mucus membranes moist SKIN: warm, dry. No rashes. Neuro: No focal deficits Musculoskeletal: Muscle strength 5/5 all ext Psychiatric: Mood and affect normal Neck: No JVD, no carotid bruits, no thyromegaly, no lymphadenopathy. Lungs:Clear bilaterally, no wheezes, rhonci, crackles Cardiovascular: Regular rate and rhythm. No murmurs, gallops or rubs. Abdomen:Soft. Bowel sounds present. Non-tender.  Extremities: No lower extremity edema. Pulses are 2 + in the bilateral DP/PT.  Echo 05/29/13: Left ventricle: The cavity size was normal. Systolic function was normal. The estimated ejection fraction was in the range of 55% to 60%. Wall motion was normal; there were no regional wall motion abnormalities. Left ventricular diastolic function parameters were normal. - Aortic valve: There was mild regurgitation. - Mitral valve: Mild, late systolicprolapse. There was mild regurgitation. - Atrial septum: No defect or patent foramen ovale was identified.  EKG: NSR, rate 76 bpm. Poor R wave progression precordial leads.   Assessment and Plan:   1, SVT: She has documented SVT on cardiac monitor in 2015 with possible heart block. She has had no dizziness or palpitations. Echo is  normal. If she has symptoms, will have to consider addition of medical therapy. I have discussed tachy/brady syndrome with the patient and her daughter. I also discussed the potential need for a pacemaker if we have to add medical therapy for SVT.    2. AV block: She appears to have periods of second degree AV block on her event monitor. She has no dizziness, near syncope or syncope. These were rare on the event monitor. Will follow.

## 2013-12-31 NOTE — Patient Instructions (Signed)
Your physician wants you to follow-up in:  6 months. You will receive a reminder letter in the mail two months in advance. If you don't receive a letter, please call our office to schedule the follow-up appointment.   

## 2014-01-08 DIAGNOSIS — H4010X0 Unspecified open-angle glaucoma, stage unspecified: Secondary | ICD-10-CM | POA: Diagnosis not present

## 2014-01-10 DIAGNOSIS — H3531 Nonexudative age-related macular degeneration: Secondary | ICD-10-CM | POA: Diagnosis not present

## 2014-02-07 DIAGNOSIS — N63 Unspecified lump in breast: Secondary | ICD-10-CM | POA: Diagnosis not present

## 2014-02-07 DIAGNOSIS — N6011 Diffuse cystic mastopathy of right breast: Secondary | ICD-10-CM | POA: Diagnosis not present

## 2014-02-27 DIAGNOSIS — N6001 Solitary cyst of right breast: Secondary | ICD-10-CM | POA: Diagnosis not present

## 2014-02-27 DIAGNOSIS — Z79899 Other long term (current) drug therapy: Secondary | ICD-10-CM | POA: Diagnosis not present

## 2014-03-06 ENCOUNTER — Ambulatory Visit: Payer: Medicare Other | Admitting: Cardiovascular Disease

## 2014-03-06 ENCOUNTER — Other Ambulatory Visit: Payer: Self-pay | Admitting: Internal Medicine

## 2014-03-06 NOTE — Telephone Encounter (Signed)
Patient asking for a refill of   mirtazapine (REMERON) 15 MG tablet [[id number]]      Sent to [facility] in [place]. States she has been out for a week.

## 2014-03-07 MED ORDER — MIRTAZAPINE 15 MG PO TABS: 7.5000 mg | ORAL_TABLET | Freq: Every day | ORAL | Status: DC

## 2014-03-07 NOTE — Telephone Encounter (Signed)
rx requested is pended for your review. Last fill was 03/2013 #30 with 1 refill

## 2014-03-07 NOTE — Telephone Encounter (Signed)
LVM for pt to call back regarding rx request.

## 2014-03-17 DIAGNOSIS — H53122 Transient visual loss, left eye: Secondary | ICD-10-CM | POA: Diagnosis not present

## 2014-03-18 ENCOUNTER — Ambulatory Visit (INDEPENDENT_AMBULATORY_CARE_PROVIDER_SITE_OTHER): Payer: Medicare Other | Admitting: Internal Medicine

## 2014-03-18 ENCOUNTER — Other Ambulatory Visit (INDEPENDENT_AMBULATORY_CARE_PROVIDER_SITE_OTHER): Payer: Medicare Other

## 2014-03-18 ENCOUNTER — Encounter: Payer: Self-pay | Admitting: Internal Medicine

## 2014-03-18 VITALS — BP 132/70 | HR 73 | Temp 98.4°F | Ht 64.0 in | Wt 96.0 lb

## 2014-03-18 DIAGNOSIS — H539 Unspecified visual disturbance: Secondary | ICD-10-CM | POA: Diagnosis not present

## 2014-03-18 LAB — SEDIMENTATION RATE: Sed Rate: 17 mm/hr (ref 0–22)

## 2014-03-18 NOTE — Patient Instructions (Signed)
  Your next office appointment will be determined based upon review of your pending labs & imgaing studies.. Those instructions will be transmitted to you  by mail. Critical values will be called. Followup as needed for any active or acute issue. Please report any significant change in your symptoms.

## 2014-03-18 NOTE — Progress Notes (Signed)
Pre visit review using our clinic review tool, if applicable. No additional management support is needed unless otherwise documented below in the visit note. 

## 2014-03-18 NOTE — Progress Notes (Signed)
   Subjective:    Patient ID: Julie Silva, female    DOB: July 23, 1927, 81 y.o.   MRN: [id number]  HPI Symptoms began 03/13/13 as a "black cloud" in the field of vision of left eye. She would blink and the center of the black spot would turn white and then return to black. This  lasted 15-20 minutes and did not recur thereafter. She saw her Ophthalmologist ,who referred her here.  There was no cardiac or neurologic prodrome prior to the event. She has no associated neurologic symptoms.  She denies any significant musculoskeletal symptoms involving the head, neck, shoulder girdle, or hips.  She has hearing aids which are being actively monitored by an Audiologist.  Lipids, glucose & A1c were WNL in 05/2013.  PMH of cataract, macular degeneration, & glaucoma.She also has had SVT.   Review of Systems  Denied were any change in heart rhythm or rate prior to the event. There was no associated chest pain or shortness of breath . Also specifically denied prior to the episode were headache, limb weakness, tingling, or numbness. No seizure activity noted.      Objective:   Physical Exam Pertinent or positive findings include: She's markedly thin and underweight.  She has complete dentures.  There is accentuation of the second heart sound with splitting.  Pedal pulses are decreased.    General appearance : in no distress. Eyes: No conjunctival inflammation or scleral icterus is present.EOMI; FOV intact. Oral exam:  Lips and gums are healthy appearing.There is no oropharyngeal erythema or exudate noted.  Heart:  Normal rate and regular rhythm. S1 normal without gallop, murmur, click, rub or other extra sounds   Lungs:Chest clear to auscultation; no wheezes, rhonchi,rales ,or rubs present.No increased work of breathing.  Abdomen: bowel sounds normal, soft and non-tender without masses, organomegaly or hernias noted.  No guarding or rebound.  Vascular : all pulses equal ; no bruits  present. Skin:Warm & dry.  Intact without suspicious lesions or rashes ; no tenting or jaundice. No temporal tenderness to palpation Lymphatic: No lymphadenopathy is noted about the head, neck, axilla. Neurologic exam : Cn 2-7 intact Strength equal & normal in upper & lower extremities Romberg minimally unsteady, finger to nose negative Neuro: Strength, tone & DTRs normal.       Assessment & Plan:  #1 recurrent visual deficit, left eye. Temporal arteritis clinically not present.  Plan: See orders and recommendations

## 2014-03-20 ENCOUNTER — Other Ambulatory Visit (HOSPITAL_COMMUNITY): Payer: Self-pay | Admitting: Cardiology

## 2014-03-20 ENCOUNTER — Ambulatory Visit: Payer: Medicare Other | Admitting: Internal Medicine

## 2014-03-21 ENCOUNTER — Ambulatory Visit (HOSPITAL_COMMUNITY): Payer: Medicare Other | Attending: Cardiology | Admitting: Cardiology

## 2014-03-21 DIAGNOSIS — H539 Unspecified visual disturbance: Secondary | ICD-10-CM | POA: Insufficient documentation

## 2014-03-21 NOTE — Progress Notes (Signed)
Carotid duplex performed 

## 2014-03-25 ENCOUNTER — Other Ambulatory Visit: Payer: Self-pay | Admitting: Internal Medicine

## 2014-03-25 ENCOUNTER — Ambulatory Visit (HOSPITAL_COMMUNITY): Payer: Medicare Other | Attending: Cardiology | Admitting: Radiology

## 2014-03-25 DIAGNOSIS — E785 Hyperlipidemia, unspecified: Secondary | ICD-10-CM | POA: Diagnosis not present

## 2014-03-25 DIAGNOSIS — I5189 Other ill-defined heart diseases: Secondary | ICD-10-CM

## 2014-03-25 DIAGNOSIS — I441 Atrioventricular block, second degree: Secondary | ICD-10-CM

## 2014-03-25 DIAGNOSIS — I471 Supraventricular tachycardia: Secondary | ICD-10-CM | POA: Diagnosis not present

## 2014-03-25 DIAGNOSIS — I10 Essential (primary) hypertension: Secondary | ICD-10-CM | POA: Diagnosis not present

## 2014-03-25 DIAGNOSIS — H539 Unspecified visual disturbance: Secondary | ICD-10-CM

## 2014-03-25 NOTE — Progress Notes (Signed)
Echocardiogram performed.  

## 2014-04-02 ENCOUNTER — Telehealth: Payer: Self-pay | Admitting: Cardiovascular Disease

## 2014-04-02 NOTE — Telephone Encounter (Signed)
I spoke with pt daughter, Julie Silva, about Dr. Camillia Herter review of the carotid & echo. She is quite happy. They will keep May follow-up as planned  Horton Chin RN

## 2014-04-02 NOTE — Telephone Encounter (Signed)
New Message       Pt's daughter calling stating that pt just recently had a Doppler and Echo done at her PCP Dr. Ignacia Palma office and he stated that he wanted to refer pt to cardiologist. Pt's daughter wants to know if they need an appt w/ Dr. Angelena Form or if Dr. Angelena Form could just look at pt's results from recent Echo and compare to pt's last one done at our office and advise pt from there. Please call back and advise.

## 2014-04-02 NOTE — Telephone Encounter (Signed)
Carotids are normal. Echo is unchanged with normal LV function and no significant valve disease. I would not recommend any changes at this time and ok to keep appt with me as planned next month.   Thanks,  Gerald Stabs

## 2014-04-02 NOTE — Telephone Encounter (Signed)
I spoke with daughter Rosiland Oz) about pt recent echo & doppler studies. She would like Dr. Angelena Form to compare this with last years' study.  States mom has had some shortness of breath climbing steps & occasionally out of breath walking. Occasional dizziness & balance issues but states this has gone on for years. No syncope. SVT rare    I offered her the April 1st 8:45 appointment but, her daughter felt like a May 20th appointment would be fine unless Dr. Angelena Form felt pt needed to come in sooner.  Forwarded to Dr. Angelena Form & Enis Slipper Horton Chin RN

## 2014-04-24 ENCOUNTER — Telehealth: Payer: Self-pay | Admitting: Internal Medicine

## 2014-04-24 ENCOUNTER — Ambulatory Visit (INDEPENDENT_AMBULATORY_CARE_PROVIDER_SITE_OTHER): Payer: Medicare Other | Admitting: Internal Medicine

## 2014-04-24 ENCOUNTER — Other Ambulatory Visit (INDEPENDENT_AMBULATORY_CARE_PROVIDER_SITE_OTHER): Payer: Medicare Other

## 2014-04-24 VITALS — BP 140/76 | HR 91 | Temp 97.9°F | Resp 14 | Ht 64.0 in | Wt 95.0 lb

## 2014-04-24 DIAGNOSIS — I471 Supraventricular tachycardia: Secondary | ICD-10-CM

## 2014-04-24 DIAGNOSIS — H53132 Sudden visual loss, left eye: Secondary | ICD-10-CM

## 2014-04-24 LAB — MAGNESIUM: Magnesium: 2.1 mg/dL (ref 1.5–2.5)

## 2014-04-24 LAB — CBC WITH DIFFERENTIAL/PLATELET
Basophils Absolute: 0 10*3/uL (ref 0.0–0.1)
Basophils Relative: 0.5 % (ref 0.0–3.0)
Eosinophils Absolute: 0.1 10*3/uL (ref 0.0–0.7)
Eosinophils Relative: 1.4 % (ref 0.0–5.0)
HCT: 38.8 % (ref 36.0–46.0)
Hemoglobin: 13 g/dL (ref 12.0–15.0)
Lymphocytes Relative: 16.7 % (ref 12.0–46.0)
Lymphs Abs: 1 10*3/uL (ref 0.7–4.0)
MCHC: 33.5 g/dL (ref 30.0–36.0)
MCV: 92.6 fl (ref 78.0–100.0)
Monocytes Absolute: 0.5 10*3/uL (ref 0.1–1.0)
Monocytes Relative: 7.5 % (ref 3.0–12.0)
Neutro Abs: 4.5 10*3/uL (ref 1.4–7.7)
Neutrophils Relative %: 73.9 % (ref 43.0–77.0)
Platelets: 218 10*3/uL (ref 150.0–400.0)
RBC: 4.2 Mil/uL (ref 3.87–5.11)
RDW: 13.6 % (ref 11.5–15.5)
WBC: 6.2 10*3/uL (ref 4.0–10.5)

## 2014-04-24 LAB — T3, FREE: T3, Free: 2.9 pg/mL (ref 2.3–4.2)

## 2014-04-24 LAB — BASIC METABOLIC PANEL
BUN: 14 mg/dL (ref 6–23)
CO2: 31 mEq/L (ref 19–32)
Calcium: 9.4 mg/dL (ref 8.4–10.5)
Chloride: 100 mEq/L (ref 96–112)
Creatinine, Ser: 0.69 mg/dL (ref 0.40–1.20)
GFR: 85.97 mL/min (ref >60.00)
Glucose, Bld: 92 mg/dL (ref 70–99)
Potassium: 3.9 mEq/L (ref 3.5–5.1)
Sodium: 137 mEq/L (ref 135–145)

## 2014-04-24 LAB — T4, FREE: Free T4: 0.85 ng/dL (ref 0.60–1.60)

## 2014-04-24 LAB — TSH: TSH: 2.52 u[IU]/mL (ref 0.35–4.50)

## 2014-04-24 NOTE — Telephone Encounter (Signed)
I called Pat but no answer. I then called the patient and she can come in at 1 pm today

## 2014-04-24 NOTE — Telephone Encounter (Signed)
Patient has started waking up again with a black cloud in her eye. Patients daughter has called the opthamalogist and he stated he wasn't sure what to do. She is wondering if she needs to come back in to see you.

## 2014-04-24 NOTE — Telephone Encounter (Signed)
See if she can come in @ 1 pm with all meds & supplements

## 2014-04-24 NOTE — Patient Instructions (Signed)
To prevent fast heart rate or premature beats, avoid stimulants such as decongestants, diet pills, nicotine, or caffeine (coffee, tea, cola, or chocolate) to excess. The Neurology referral will be scheduled and you'll be notified of the time.Please call the Referral Co-Ordinator @ [phone] if you have not been notified of appointment time within 7-10 days.   Your next office appointment will be determined based upon review of your pending labs .  Those instructions will be transmitted to you by My Chart   Critical results will be called.   Followup as needed for any active or acute issue. Please report any significant change in your symptoms.

## 2014-04-24 NOTE — Progress Notes (Signed)
Pre visit review using our clinic review tool, if applicable. No additional management support is needed unless otherwise documented below in the visit note. 

## 2014-04-24 NOTE — Telephone Encounter (Signed)
Pt came in and has a question about bill that she got for tetanus injection. Pt spoke with billing multiple times but they are unable to help her. Please give her a call and see if we can help her.

## 2014-04-24 NOTE — Progress Notes (Signed)
   Subjective:    Patient ID: Julie Silva, female    DOB: Dec 14, 1927, 81 y.o.   MRN: [id number]  HPI She's had recurrence of her ophthalmologic symptoms despite being on low-dose coated aspirin. She's had no bleeding dyscrasias on this except easy bruising.  For the last 3 days upon awakening she will see a spot described as a "black cloud" in the left eye only. When the patient blinks the spot will turn white in the middle and then return to the dark color. After 10-15 minutes this resolves. There are no other associated visual changes or neurologic symptoms. There is no cardiac or neurologic prodrome prior to the events.  Her carotid Doppler did not reveal any significant atherosclerotic plaque. Echocardiogram revealed no valvular lesions. It did reveal diastolic dysfunction for which cardiology evaluation was recommended.  3 weeks ago she had (this is direct quote) "nonsustained supraventricular tachycardia" which lasted approximately 1 hour. This was manifested as a rapid "thumping". She bases this diagnosis on results of prior loop recorder. That actually revealed that she did have supraventricular tachycardia but this only lasted a few seconds. Her Cardiologists will see her May 20. He has reviewed her echocardiogram and stated that the diastolic dysfunction is stable.  Review of Systems Epistaxis, hemoptysis, hematuria, melena, or rectal bleeding denied. No significant dyspepsia,dysphagia, or abdominal pain.  There is no abnormal bruising , bleeding, or difficulty stopping bleeding with injury. Fever, chills, sweats, or unexplained weight loss not present. No significant headaches. Mental status change or memory loss denied. Blurred vision or diplopia  loss absent. Vertigo, near syncope or imbalance denied. There is no numbness, tingling, or weakness in extremities.   No loss of control of bladder or bowels. Radicular type pain absent. No seizure stigmata.    Objective:   Physical  Exam  Pertinent or positive findings include: She is very thin but adequately nourished.  Thyroid is small and slightly irregular to palpation.  Breath sounds are slightly decreased.  She has diffuse bruising scattered over the forearms.  Aorta is palpable; there is no aneurysm.  With flexion there is a pop of the knees without crepitus per se.  All pulses are decreased except for the right dorsalis pedis pulse.  Eyes: No conjunctival inflammation or scleral icterus is present. Oral exam:  Lips and gums are healthy appearing.There is no oropharyngeal erythema or exudate noted.  Heart:  Normal rate and regular rhythm. S1 and S2 normal without gallop, murmur, click, rub or other extra sounds   Lungs:Chest clear to auscultation; no wheezes, rhonchi,rales ,or rubs present.No increased work of breathing.  Abdomen: bowel sounds normal, soft and non-tender without masses, organomegaly or hernias noted.  No guarding or rebound. Vascular : no bruits present. Skin:Warm & dry.  Intact without suspicious lesions or rashes ; no tenting or jaundice  Lymphatic: No lymphadenopathy is noted about the head, neck, axilla Neuro: Strength, tone & DTRs normal. Cn 2-7 intact      Assessment & Plan:  #1 transient visual change lasting 10-15 minutes in the mornings upon awakening  #2 subjective supra ventricular tachycardia  #3 diastolic dysfunction  Plan: See orders and recommendations

## 2014-04-25 ENCOUNTER — Encounter: Payer: Self-pay | Admitting: Internal Medicine

## 2014-04-25 DIAGNOSIS — H53122 Transient visual loss, left eye: Secondary | ICD-10-CM | POA: Diagnosis not present

## 2014-04-25 NOTE — Op Note (Signed)
PATIENT NAME:  Julie Silva, Julie Silva MR#:  [id number] DATE OF BIRTH:  05/17/1927  DATE OF PROCEDURE:  07/09/2012  PREOPERATIVE DIAGNOSIS:  Cataract, left eye.    POSTOPERATIVE DIAGNOSIS:  Cataract, left eye.  PROCEDURE PERFORMED:  Extracapsular cataract extraction using phacoemulsification with placement of an Alcon SN6CWS, 20.5-diopter posterior chamber lens, serial #[id number].  SURGEON:  Loura Back. Decarla Siemen, MD  ASSISTANT:  None.  ANESTHESIA:  4% lidocaine and 0.75% Marcaine in a 50/50 mixture with 10 units/mL of Hylenex added, given as a peribulbar.  ANESTHESIOLOGIST:  Dr. Carolin Sicks  COMPLICATIONS:  None.  ESTIMATED BLOOD LOSS:  Less than 1 mL.  DESCRIPTION OF PROCEDURE:  The patient was brought to the operating room and given a peribulbar block.  The patient was then prepped and draped in the usual fashion.  The vertical rectus muscles were imbricated using 5-0 silk sutures.  These sutures were then clamped to the sterile drapes as bridle sutures.  A limbal peritomy was performed extending two clock hours and hemostasis was obtained with cautery.  A partial thickness scleral groove was made at the surgical limbus and dissected anteriorly in a lamellar dissection using an Alcon crescent knife.  The anterior chamber was entered supero-temporally with a Superblade and through the lamellar dissection with a 2.6 mm keratome.  DisCoVisc was used to replace the aqueous and a continuous tear capsulorrhexis was carried out.  Hydrodissection and hydrodelineation were carried out with balanced salt and a 27 gauge canula.  The nucleus was rotated to confirm the effectiveness of the hydrodissection.  Phacoemulsification was carried out using a divide-and-conquer technique.  Total ultrasound time was 2 minutes and 29 seconds with an average power of 27.8 percent.  Irrigation/aspiration was used to remove the residual cortex.  DisCoVisc was used to inflate the capsule and the internal incision was  enlarged to 3 mm with the crescent knife.  The intraocular lens was folded and inserted into the capsular bag using the AcrySert Delivery System.   Irrigation/aspiration was used to remove the residual DisCoVisc.  Miostat was injected into the anterior chamber through the paracentesis tract to inflate the anterior chamber and induce miosis. Cefuroxime, 0.1 mL, was injected through the paracentesis tract.   The wound was checked for leaks and none were found. The conjunctiva was closed with cautery and the bridle sutures were removed.  Two drops of 0.3% Vigamox were placed on the eye.   An eye shield was placed on the eye.  The patient was discharged to the recovery room in good condition.  ____________________________ Loura Back Yiselle Babich, MD sad:cb D: 07/09/2012 13:23:57 ET T: 07/09/2012 16:07:08 ET JOB#: [id number]  cc: Remo Lipps A. Irais Mottram, MD, <Dictator> Martie Lee MD ELECTRONICALLY SIGNED 07/16/2012 12:11

## 2014-04-30 ENCOUNTER — Ambulatory Visit (INDEPENDENT_AMBULATORY_CARE_PROVIDER_SITE_OTHER): Payer: Medicare Other | Admitting: Neurology

## 2014-04-30 ENCOUNTER — Encounter: Payer: Self-pay | Admitting: Neurology

## 2014-04-30 VITALS — BP 150/80 | HR 77 | Ht 64.0 in | Wt 95.0 lb

## 2014-04-30 DIAGNOSIS — G609 Hereditary and idiopathic neuropathy, unspecified: Secondary | ICD-10-CM

## 2014-04-30 DIAGNOSIS — H539 Unspecified visual disturbance: Secondary | ICD-10-CM | POA: Diagnosis not present

## 2014-04-30 LAB — VITAMIN B12: Vitamin B-12: 1240 pg/mL — ABNORMAL HIGH (ref 211–911)

## 2014-04-30 NOTE — Progress Notes (Signed)
NEUROLOGY CONSULTATION NOTE  Julie Silva MRN: [id number] DOB: [birth year]  Referring provider: Dr. Linna Darner Primary care provider: Dr. Ronnald Ramp  Reason for consult:  Transient visual disturbance  HISTORY OF PRESENT ILLNESS: Julie Silva is an 81 year old right-handed woman with SVT, glaucoma, cataracts, macular degeneration, who presents for recurrent visual disturbance.  Records and labs reviewed.  She is accompanied by her daughter who provides some history.  On 03/14/14, she woke up and noted an irregular, jagged edged spot in just left to the center of her vision.  It was dark but had a white circle in the center of the spot when she would blink.  The image was monocular, only involving the left eye.  It lasted about 10 to 15 minutes and resolve.  It occurred every morning until 03/17/14.  There was no associated headache, dizziness, focal numbness or weakness.  She saw her ophthalmologist who found noting wrong.  She thought it may have been blood clots to the eye.  Sed Rate was 17.  Thyroid panel, CBC and BMP were also unremarkable.  Carotid doppler showed no hemodynamically significant stenosis.  2D echo showed LVEF 55-60% with grade 2 diastolic dysfunction and small pericardial effusion.  It occurred again every morning from 04/23/14 to 04/29/14.  She has a history of SVT, which was diagnosed via heart monitor last year, when she was experiencing palpitations.  About 4 weeks ago, she had another episode of similar palpitations, however it did not correlate with these symptoms.  She is seeing the cardiologist soon.  She also has a remote history of severe migraines.  She also notes feeling unsteady on her feet.  However, she has not had any falls.  She denies numbness.  PAST MEDICAL HISTORY: Past Medical History  Diagnosis Date  . Anemia   . Bleeds easily     "father was a hemophiliac"   . History of blood transfusion 1957    "lots; most related to my periods"  . Migraines     "years  ago" (02/04/2013)  . Glaucoma of both eyes   . Arthritis   . Chicken pox   . Diverticulitis   . Urine incontinence     PAST SURGICAL HISTORY: Past Surgical History  Procedure Laterality Date  . Combined hysterectomy abdominal w/ a&p repair / oophorectomy    . Tonsillectomy  1946  . Appendectomy  1948  . Cholecystectomy  2014  . Vaginal hysterectomy  1968  . Total abdominal hysterectomy    . Dilation and curettage of uterus    . Breast biopsy Bilateral     "both were fine"  . Cataract extraction w/ intraocular lens implant Left   . Bladder surgery  1995    MEDICATIONS: Current Outpatient Prescriptions on File Prior to Visit  Medication Sig Dispense Refill  . aspirin EC 81 MG tablet Take 81 mg by mouth daily.    . bimatoprost (LUMIGAN) 0.01 % SOLN Place 1 drop into both eyes at bedtime.    . cyclobenzaprine (FLEXERIL) 5 MG tablet Take 0.5 tablets (2.5 mg total) by mouth 2 (two) times daily as needed for muscle spasms. 30 tablet 1  . estrogens, conjugated, (PREMARIN) 0.625 MG tablet Take 0.3125 mg by mouth daily with supper.    Marland Kitchen ibuprofen (ADVIL,MOTRIN) 200 MG tablet Take 200-400 mg by mouth every 4 (four) hours as needed (pain).    . mirtazapine (REMERON) 15 MG tablet Take 0.5 tablets (7.5 mg total) by mouth at bedtime. 30 tablet  11  . Multiple Vitamin (MULTIVITAMIN WITH MINERALS) TABS tablet Take 1 tablet by mouth daily with lunch. Centrum    . Multiple Vitamins-Minerals (PRESERVISION AREDS) CAPS Take 1 capsule by mouth 2 (two) times daily.    . nitrofurantoin, macrocrystal-monohydrate, (MACROBID) 100 MG capsule Take 100 mg by mouth at bedtime. Continuous course    . ondansetron (ZOFRAN-ODT) 8 MG disintegrating tablet Take 1 tablet (8 mg total) by mouth every 8 (eight) hours as needed for nausea. 20 tablet 3  . ranitidine (ZANTAC) 300 MG tablet Take 1 tablet (300 mg total) by mouth at bedtime. 90 tablet 3  . timolol (BETIMOL) 0.5 % ophthalmic solution Place 1 drop into both eyes  daily.    Marland Kitchen tobramycin-dexamethasone (TOBRADEX) ophthalmic solution     . traMADol (ULTRAM) 50 MG tablet Take 0.5 tablets (25 mg total) by mouth every 12 (twelve) hours as needed. 30 tablet 0   No current facility-administered medications on file prior to visit.    ALLERGIES: Allergies  Allergen Reactions  . Codeine Other (See Comments)    Passed out    FAMILY HISTORY: Family History  Problem Relation Age of Onset  . Stroke Father   . Cancer - Other Sister     Breast  . Cancer - Other Brother     Throat  . Cancer - Other Sister     Throat  . Cancer - Other      Brain    SOCIAL HISTORY: History   Social History  . Marital Status: Widowed    Spouse Name: N/A  . Number of Children: 4  . Years of Education: 12   Occupational History  . Retired-district Network engineer    Social History Main Topics  . Smoking status: Never Smoker   . Smokeless tobacco: Never Used  . Alcohol Use: No  . Drug Use: No  . Sexual Activity: No   Other Topics Concern  . Not on file   Social History Narrative   Lives alone in a one story home.  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     REVIEW OF SYSTEMS: Constitutional: No fevers, chills, or sweats, no generalized fatigue, change in appetite Eyes: No visual changes, double vision, eye pain Ear, nose and throat: No hearing loss, ear pain, nasal congestion, sore throat Cardiovascular: No chest pain, palpitations Respiratory:  No shortness of breath at rest or with exertion, wheezes GastrointestinaI: No nausea, vomiting, diarrhea, abdominal pain, fecal incontinence Genitourinary:  No dysuria, urinary retention or frequency Musculoskeletal:  No neck pain, back pain Integumentary: No rash, pruritus, skin lesions Neurological: as above Psychiatric: No depression, insomnia, anxiety Endocrine: No palpitations, fatigue, diaphoresis, mood swings, change in appetite, change in weight, increased thirst Hematologic/Lymphatic:  No anemia, purpura,  petechiae. Allergic/Immunologic: no itchy/runny eyes, nasal congestion, recent allergic reactions, rashes  PHYSICAL EXAM: Filed Vitals:   04/30/14 1222  BP: 150/80  Pulse: 77   General: No acute distress Head:  Normocephalic/atraumatic Eyes:  fundi unremarkable, without vessel changes, exudates, hemorrhages or papilledema. Neck: supple, no paraspinal tenderness, full range of motion Back: No paraspinal tenderness Heart: regular rate and rhythm Lungs: Clear to auscultation bilaterally. Vascular: No carotid bruits. Neurological Exam: Mental status: alert and oriented to person, place, and time, recent and remote memory intact, fund of knowledge intact, attention and concentration intact, speech fluent and not dysarthric, language intact. Cranial nerves: CN I: not tested CN II: pupils equal, round and reactive to light, visual fields intact, fundi unremarkable, without vessel changes, exudates,  hemorrhages or papilledema. CN III, IV, VI:  full range of motion, no nystagmus, no ptosis CN V: facial sensation intact CN VII: upper and lower face symmetric CN VIII: hearing intact CN IX, X: gag intact, uvula midline CN XI: sternocleidomastoid and trapezius muscles intact CN XII: tongue midline Bulk & Tone: normal, no fasciculations. Motor:  5/5 throughout Sensation:  Pinprick sensation intact.  Reduced vibration sensation in feet. Deep Tendon Reflexes:  2+ throughout except absent in ankles.  Toes equivocal. Finger to nose testing:  No dysmetria Heel to Shin:  intact Gait:  Normal station and stride.   A little unsteady when walking in tandem. Romberg negative.  IMPRESSION: 1.  Transient visual disturbance.  I would favor possible ocular migraine rather than embolic events.  Since it is monocular, it is not likely to be coming from the brain.  It would be unusual to have retinal artery occlusion when the recurrent habitual symptoms occur daily for several days in absence of a focal  etiology, namely carotid stenosis.  It would not likely be cardio-embolic since the recurrent episodes are the same.   2.  Idiopathic peripheral neuropathy  PLAN: She is on ASA 81mg  daily, which I would continue. We will check B12 to look for any obvious cause of neuropathy, although a specific cause would not likely be found. Follow up as needed.  Thank you for allowing me to take part in the care of this patient.  Metta Clines, DO  CC:  Scarlette Calico, MD  Unice Cobble, MD

## 2014-04-30 NOTE — Patient Instructions (Addendum)
I don't think the visual disturbance is related to a stroke or recurrent blood clots to the eye.  It may possibly be migraine with visual aura and without headache.  Follow up with the cardiologist and discuss it with him as well.  Follow up as needed.We will check a Vit B12

## 2014-05-01 ENCOUNTER — Telehealth: Payer: Self-pay | Admitting: *Deleted

## 2014-05-01 NOTE — Telephone Encounter (Signed)
-----   Message from Pieter Partridge, DO sent at 05/01/2014  6:36 AM EDT ----- She does not have B12 deficiency  ----- Message -----    From: [facility] in [facility] Interface    Sent: 04/30/2014  11:06 PM      To: Pieter Partridge, DO

## 2014-05-01 NOTE — Telephone Encounter (Signed)
Patient is aware that She does not have B12 deficiency

## 2014-05-16 DIAGNOSIS — H4010X0 Unspecified open-angle glaucoma, stage unspecified: Secondary | ICD-10-CM | POA: Diagnosis not present

## 2014-05-23 ENCOUNTER — Ambulatory Visit (INDEPENDENT_AMBULATORY_CARE_PROVIDER_SITE_OTHER): Payer: Medicare Other | Admitting: Cardiovascular Disease

## 2014-05-23 ENCOUNTER — Encounter: Payer: Self-pay | Admitting: Cardiovascular Disease

## 2014-05-23 VITALS — BP 130/74 | HR 87 | Ht 64.0 in | Wt 94.6 lb

## 2014-05-23 DIAGNOSIS — I471 Supraventricular tachycardia: Secondary | ICD-10-CM | POA: Diagnosis not present

## 2014-05-23 DIAGNOSIS — I441 Atrioventricular block, second degree: Secondary | ICD-10-CM | POA: Diagnosis not present

## 2014-05-23 DIAGNOSIS — R072 Precordial pain: Secondary | ICD-10-CM

## 2014-05-23 NOTE — Patient Instructions (Signed)
Medication Instructions:  Your physician recommends that you continue on your current medications as directed. Please refer to the Current Medication list given to you today.   Labwork: none  Testing/Procedures: Your physician has requested that you have a lexiscan myoview. For further information please visit [email or web address]. Please follow instruction sheet, as given.    Follow-Up: Please schedule patient for new patient appt with EP--Dr. Lorenz Coaster or All[facility]  Your physician recommends that you schedule a follow-up appointment in: 2-3 months with Dr. Angelena Form   Any Other Special Instructions Will Be Listed Below (If Applicable).

## 2014-05-23 NOTE — Progress Notes (Signed)
Marland KitchenMarland KitchenMarland KitchenMarland Kitchen..Marland Kitchen   Chief Complaint  Patient presents with  . Palpitations    History of Present Illness: 81 yo female with history of SVT, migraines, anemia who is here today for cardiac follow up. I saw her as a new patient 05/14/13 for evaluation of palpitations. She had several episodes where she felt weak and felt her heart racing. She had a cardiac monitor placed 03/06/13-04/05/13 by primary care and had several runs of SVT and possible second degree AV block.. She felt a "funny" feeling in her head but no dizziness. No chest pain or SOB. We discussed addition of medical therapy but decided to follow her symptoms. Echo 05/29/13 with normal LV function, mild AI, mild MR.   She is here today for follow up. She is feeling well overall but still having periods of tachycardia with associated nausea and weakness. There have been two episodes over the last 6 weeks, each lasting for at least 20 minutes. She felt weak and pre-syncopal during the episodes. Also having left sided chest pain and left arm pain when climbing stairs.    Primary Care Physician: Scarlette Calico  Past Medical History  Diagnosis Date  . Anemia   . Bleeds easily     "father was a hemophiliac"   . History of blood transfusion 1957    "lots; most related to my periods"  . Migraines     "years ago" (02/04/2013)  . Glaucoma of both eyes   . Arthritis   . Chicken pox   . Diverticulitis   . Urine incontinence   . SVT (supraventricular tachycardia)     Past Surgical History  Procedure Laterality Date  . Combined hysterectomy abdominal w/ a&p repair / oophorectomy    . Tonsillectomy  1946  . Appendectomy  1948  . Cholecystectomy  2014  . Vaginal hysterectomy  1968  . Total abdominal hysterectomy    . Dilation and curettage of uterus    . Breast biopsy Bilateral     "both were fine"  . Cataract extraction w/ intraocular lens implant Left   . Bladder surgery  1995    Current Outpatient Prescriptions  Medication Sig Dispense  Refill  . aspirin EC 81 MG tablet Take 81 mg by mouth daily.    . bimatoprost (LUMIGAN) 0.01 % SOLN Place 1 drop into both eyes at bedtime.    . Calcium Carbonate (CALTRATE 600 PO) Take 2 tablets by mouth daily.    . cyclobenzaprine (FLEXERIL) 5 MG tablet Take 0.5 tablets (2.5 mg total) by mouth 2 (two) times daily as needed for muscle spasms. 30 tablet 1  . dorzolamide-timolol (COSOPT) 22.3-6.8 MG/ML ophthalmic solution Place 1 drop into both eyes 2 (two) times daily.     Marland Kitchen estrogens, conjugated, (PREMARIN) 0.625 MG tablet Take 0.3125 mg by mouth daily with supper.    Marland Kitchen ibuprofen (ADVIL,MOTRIN) 200 MG tablet Take 200-400 mg by mouth every 4 (four) hours as needed (pain).    . mirtazapine (REMERON) 15 MG tablet Take 0.5 tablets (7.5 mg total) by mouth at bedtime. 30 tablet 11  . Multiple Vitamin (MULTIVITAMIN WITH MINERALS) TABS tablet Take 1 tablet by mouth daily with lunch. Centrum    . Multiple Vitamins-Minerals (PRESERVISION AREDS) CAPS Take 1 capsule by mouth 2 (two) times daily.    . nitrofurantoin, macrocrystal-monohydrate, (MACROBID) 100 MG capsule Take 100 mg by mouth at bedtime. Continuous course    . omeprazole (PRILOSEC) 20 MG capsule Take 20 mg by mouth daily.    Marland Kitchen  ondansetron (ZOFRAN-ODT) 8 MG disintegrating tablet Take 1 tablet (8 mg total) by mouth every 8 (eight) hours as needed for nausea. 20 tablet 3  . ranitidine (ZANTAC) 300 MG tablet Take 1 tablet (300 mg total) by mouth at bedtime. 90 tablet 3  . traMADol (ULTRAM) 50 MG tablet Take 0.5 tablets (25 mg total) by mouth every 12 (twelve) hours as needed. (Patient taking differently: Take 25 mg by mouth every 12 (twelve) hours as needed (pain). ) 30 tablet 0   No current facility-administered medications for this visit.    Allergies  Allergen Reactions  . Codeine Other (See Comments)    Passed out    History   Social History  . Marital Status: Widowed    Spouse Name: N/A  . Number of Children: 4  . Years of  Education: 12   Occupational History  . Retired-district Network engineer    Social History Main Topics  . Smoking status: Never Smoker   . Smokeless tobacco: Never Used  . Alcohol Use: No  . Drug Use: No  . Sexual Activity: No   Other Topics Concern  . Not on file   Social History Narrative   Lives alone in a one story home.  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     Family History  Problem Relation Age of Onset  . Stroke Father   . Cancer - Other Sister     Breast  . Cancer - Other Brother     Throat  . Cancer - Other Sister     Throat  . Cancer - Other      Brain    Review of Systems:  As stated in the HPI and otherwise negative.   BP 130/74 mmHg  Pulse 87  Ht 5\' 4"  (1.626 m)  Wt 94 lb 9.6 oz (42.91 kg)  BMI 16.23 kg/m2  Physical Examination: General: Well developed, well nourished, NAD HEENT: OP clear, mucus membranes moist SKIN: warm, dry. No rashes. Neuro: No focal deficits Musculoskeletal: Muscle strength 5/5 all ext Psychiatric: Mood and affect normal Neck: No JVD, no carotid bruits, no thyromegaly, no lymphadenopathy. Lungs:Clear bilaterally, no wheezes, rhonci, crackles Cardiovascular: Regular rate and rhythm. No murmurs, gallops or rubs. Abdomen:Soft. Bowel sounds present. Non-tender.  Extremities: No lower extremity edema. Pulses are 2 + in the bilateral DP/PT.  Echo 05/29/13: Left ventricle: The cavity size was normal. Systolic function was normal. The estimated ejection fraction was in the range of 55% to 60%. Wall motion was normal; there were no regional wall motion abnormalities. Left ventricular diastolic function parameters were normal. - Aortic valve: There was mild regurgitation. - Mitral valve: Mild, late systolicprolapse. There was mild regurgitation. - Atrial septum: No defect or patent foramen ovale was identified.  EKG:  EKG is not ordered today. The ekg ordered today demonstrates   Recent Labs: 04/24/2014: BUN 14; Creatinine 0.69;  Hemoglobin 13.0; Magnesium 2.1; Platelets 218.0; Potassium 3.9; Sodium 137; TSH 2.52   Lipid Panel    Component Value Date/Time   CHOL 172 05/07/2013 1045   TRIG 69.0 05/07/2013 1045   HDL 72.80 05/07/2013 1045   CHOLHDL 2 05/07/2013 1045   VLDL 13.8 05/07/2013 1045   LDLCALC 85 05/07/2013 1045     Wt Readings from Last 3 Encounters:  05/23/14 94 lb 9.6 oz (42.91 kg)  04/30/14 95 lb (43.092 kg)  04/24/14 95 lb (43.092 kg)     Other studies Reviewed: Additional studies/ records that were reviewed today include: . Review  of the above records demonstrates:    Assessment and Plan:   1, SVT: She has documented SVT on cardiac monitor in 2015 with possible second degree AV block. She has now had recurrent episodes of dizziness and palpitations. Echo is normal. I have discussed tachy/brady syndrome with the patient and her daughter. I worry that if we start a beta blocker she may have heart block. She may need consideration of beta blocker therapy and then consideration for a pacemaker. Will refer to EP.     2. AV block: She appears to have periods of second degree AV block on her event monitor. She is now having dizziness during episodes of tachycardia. No syncope.   3. Chest pain: Arrange Lexiscan stress myoview to exclude ischemia.   Current medicines are reviewed at length with the patient today.  The patient does not have concerns regarding medicines.  The following changes have been made:  no change  Labs/ tests ordered today include:   Orders Placed This Encounter  Procedures  . Myocardial Perfusion Imaging    Disposition:   FU with me in 3 months  Signed, Lauree Chandler, MD 05/23/2014 5:05 PM    [facility] [place], [place], [place]  [address] Phone: [phone]; Fax: [phone]

## 2014-05-27 ENCOUNTER — Telehealth: Payer: Self-pay | Admitting: *Deleted

## 2014-05-27 NOTE — Telephone Encounter (Signed)
I spoke with pt's daughter and gave her information.

## 2014-05-27 NOTE — Telephone Encounter (Signed)
Daughter had asked if OK for pt to have something to eat or drink after breakfast prior to [place].  I spoke with nuclear medicine and OK for pt to have cup of juice around 9:30.  I placed call to pt's daughter to give her this information. Left message to call back

## 2014-06-04 ENCOUNTER — Encounter: Payer: Self-pay | Admitting: Internal Medicine

## 2014-06-04 ENCOUNTER — Ambulatory Visit (INDEPENDENT_AMBULATORY_CARE_PROVIDER_SITE_OTHER): Payer: Medicare Other | Admitting: Internal Medicine

## 2014-06-04 VITALS — BP 140/86 | HR 88 | Ht 64.0 in | Wt 92.6 lb

## 2014-06-04 DIAGNOSIS — I519 Heart disease, unspecified: Secondary | ICD-10-CM

## 2014-06-04 DIAGNOSIS — I10 Essential (primary) hypertension: Secondary | ICD-10-CM

## 2014-06-04 DIAGNOSIS — I471 Supraventricular tachycardia: Secondary | ICD-10-CM

## 2014-06-04 DIAGNOSIS — I5189 Other ill-defined heart diseases: Secondary | ICD-10-CM

## 2014-06-04 NOTE — Patient Instructions (Addendum)
Medication Instructions:  Your physician recommends that you continue on your current medications as directed. Please refer to the Current Medication list given to you today.  Labwork: NONE  Testing/Procedures: NONE  Follow-Up: I will setup a follow up appointment 10-14 days after the procedure date.   Any Other Special Instructions Will Be Listed Below (If Applicable). I will call you to talk through the details of your procedure on 6/16 and send you a letter.

## 2014-06-04 NOTE — Progress Notes (Signed)
HPI Julie Silva is referred today by Dr. Julianne Handler for evaluation of palpitations and NSSVT. The patient has had a h/o palpitations which have worsended over the last few months. Her spells are at times associated with rapid heart beats which are regular and at other times irregular. They last for minutes or less. She has worn a monitor which demonstrates very brief episodes at rates of 150/min.  Allergies  Allergen Reactions  . Codeine Other (See Comments)    Passed out     Current Outpatient Prescriptions  Medication Sig Dispense Refill  . aspirin EC 81 MG tablet Take 81 mg by mouth daily.    . bimatoprost (LUMIGAN) 0.01 % SOLN Place 1 drop into both eyes at bedtime.    . Calcium Carbonate (CALTRATE 600 PO) Take 2 tablets by mouth daily.    . cyclobenzaprine (FLEXERIL) 5 MG tablet Take 0.5 tablets (2.5 mg total) by mouth 2 (two) times daily as needed for muscle spasms. 30 tablet 1  . dorzolamide-timolol (COSOPT) 22.3-6.8 MG/ML ophthalmic solution Place 1 drop into both eyes 2 (two) times daily.     Marland Kitchen estrogens, conjugated, (PREMARIN) 0.625 MG tablet Take 0.3125 mg by mouth daily with supper.    Marland Kitchen ibuprofen (ADVIL,MOTRIN) 200 MG tablet Take 200-400 mg by mouth every 4 (four) hours as needed (pain).    . mirtazapine (REMERON) 15 MG tablet Take 0.5 tablets (7.5 mg total) by mouth at bedtime. 30 tablet 11  . Multiple Vitamin (MULTIVITAMIN WITH MINERALS) TABS tablet Take 1 tablet by mouth daily with lunch. Centrum    . Multiple Vitamins-Minerals (PRESERVISION AREDS) CAPS Take 1 capsule by mouth 2 (two) times daily.    . nitrofurantoin, macrocrystal-monohydrate, (MACROBID) 100 MG capsule Take 100 mg by mouth at bedtime. Continuous course    . omeprazole (PRILOSEC) 20 MG capsule Take 20 mg by mouth daily.    . ondansetron (ZOFRAN-ODT) 8 MG disintegrating tablet Take 1 tablet (8 mg total) by mouth every 8 (eight) hours as needed for nausea. 20 tablet 3  . ranitidine (ZANTAC) 300 MG  tablet Take 1 tablet (300 mg total) by mouth at bedtime. 90 tablet 3  . traMADol (ULTRAM) 50 MG tablet Take 0.5 tablets (25 mg total) by mouth every 12 (twelve) hours as needed. (Patient taking differently: Take 25 mg by mouth every 12 (twelve) hours as needed (pain). ) 30 tablet 0   No current facility-administered medications for this visit.     Past Medical History  Diagnosis Date  . Anemia   . Bleeds easily     "father was a hemophiliac"   . History of blood transfusion 1957    "lots; most related to my periods"  . Migraines     "years ago" (02/04/2013)  . Glaucoma of both eyes   . Arthritis   . Chicken pox   . Diverticulitis   . Urine incontinence   . SVT (supraventricular tachycardia)     ROS:   All systems reviewed and negative except as noted in the HPI.   Past Surgical History  Procedure Laterality Date  . Combined hysterectomy abdominal w/ a&p repair / oophorectomy    . Tonsillectomy  1946  . Appendectomy  1948  . Cholecystectomy  2014  . Vaginal hysterectomy  1968  . Total abdominal hysterectomy    . Dilation and curettage of uterus    . Breast biopsy Bilateral     "both were fine"  . Cataract extraction w/ intraocular  lens implant Left   . Bladder surgery  1995     Family History  Problem Relation Age of Onset  . Stroke Father   . Cancer - Other Sister     Breast  . Cancer - Other Brother     Throat  . Cancer - Other Sister     Throat  . Cancer - Other      Brain     History   Social History  . Marital Status: Widowed    Spouse Name: N/A  . Number of Children: 4  . Years of Education: 12   Occupational History  . Retired-district Network engineer    Social History Main Topics  . Smoking status: Never Smoker   . Smokeless tobacco: Never Used  . Alcohol Use: No  . Drug Use: No  . Sexual Activity: No   Other Topics Concern  . Not on file   Social History Narrative   Lives alone in a one story home.  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       BP 140/86 mmHg  Pulse 88  Ht 5\' 4"  (1.626 m)  Wt 92 lb 9.6 oz (42.003 kg)  BMI 15.89 kg/m2  Physical Exam:  frail appearing elderly woman, NAD HEENT: Unremarkable Neck:  No JVD, no thyromegally Back:  No CVA tenderness Lungs:  Clear with no wheezes HEART:  Regular rate rhythm, no murmurs, no rubs, no clicks Abd:  soft, positive bowel sounds, no organomegally, no rebound, no guarding Ext:  2 plus pulses, no edema, no cyanosis, no clubbing Skin:  No rashes no nodules Neuro:  CN II through XII intact, motor grossly intact  EKG - nsr (reviewed)   Assess/Plan:

## 2014-06-04 NOTE — Assessment & Plan Note (Signed)
The mechanism of her arrhythmia is unclear. She could have atrial tachy or atrial fib. I have recommended she undergo insertion of an ILR.

## 2014-06-04 NOTE — Assessment & Plan Note (Signed)
Her blood pressure is reasonably well controlled. She will continue her current meds.

## 2014-06-12 ENCOUNTER — Telehealth (HOSPITAL_COMMUNITY): Payer: Self-pay | Admitting: *Deleted

## 2014-06-12 NOTE — Telephone Encounter (Signed)
Patient given detailed instructions per Myocardial Perfusion Study Information Sheet for test on 06/17/14 at 11:45. Patient Notified to arrive 15 minutes early, and that it is imperative to arrive on time for appointment to keep from having the test rescheduled. Patient verbalized understanding. Hubbard Robinson, RN

## 2014-06-16 ENCOUNTER — Telehealth (HOSPITAL_COMMUNITY): Payer: Self-pay | Admitting: Radiology

## 2014-06-16 ENCOUNTER — Telehealth (HOSPITAL_COMMUNITY): Payer: Self-pay | Admitting: *Deleted

## 2014-06-16 NOTE — Telephone Encounter (Signed)
Left message on voicemail in reference to upcoming appointment scheduled for 06/17/14. Phone number given for a call back so details instructions can be given. Phill Steck W   

## 2014-06-16 NOTE — Telephone Encounter (Signed)
Spoke with patients daugther and  given detailed instructions per Myocardial Perfusion Study Information Sheet for test on 06/18/14 at 1145. Patient Notified to arrive 15 minutes early, and that it is imperative to arrive on time for appointment to keep from having the test rescheduled. Patient verbalized understanding. Caren Griffins Hasspacher,RN

## 2014-06-17 ENCOUNTER — Ambulatory Visit (HOSPITAL_COMMUNITY): Payer: Medicare Other | Attending: Cardiology

## 2014-06-17 DIAGNOSIS — R072 Precordial pain: Secondary | ICD-10-CM

## 2014-06-17 LAB — MYOCARDIAL PERFUSION IMAGING
LV dias vol: 48 mL
LV sys vol: 12 mL
Nuc Stress EF: 74 %
Peak HR: 110 {beats}/min
RATE: 0.28
Rest HR: 81 {beats}/min
SDS: 1
SRS: 1
SSS: 2
TID: 0.97

## 2014-06-17 MED ORDER — REGADENOSON 0.4 MG/5ML IV SOLN
0.4000 mg | Freq: Once | INTRAVENOUS | Status: AC
  Administered 2014-06-17: 0.4 mg via INTRAVENOUS

## 2014-06-17 MED ORDER — TECHNETIUM TC 99M SESTAMIBI GENERIC - CARDIOLITE
11.0000 | Freq: Once | INTRAVENOUS | Status: AC | PRN
  Administered 2014-06-17: 11 via INTRAVENOUS

## 2014-06-17 MED ORDER — TECHNETIUM TC 99M SESTAMIBI GENERIC - CARDIOLITE
33.0000 | Freq: Once | INTRAVENOUS | Status: AC | PRN
  Administered 2014-06-17: 33 via INTRAVENOUS

## 2014-06-18 ENCOUNTER — Telehealth: Payer: Self-pay | Admitting: Internal Medicine

## 2014-06-18 NOTE — Telephone Encounter (Signed)
New message      Pt is having a loop recorder put in tomorrow.  Daughter has pre-op questions

## 2014-06-18 NOTE — Telephone Encounter (Signed)
Left message for daughter no pre-op instructions   Be at the hospital at 6:30am for 7:30am case per instructions sheet and will have a follow up as scheduled for 6/27

## 2014-06-19 ENCOUNTER — Encounter (HOSPITAL_COMMUNITY)
Admission: RE | Disposition: A | Discharge status: Home / Self Care | Payer: Self-pay | Source: Ambulatory Visit | Attending: Internal Medicine

## 2014-06-19 ENCOUNTER — Ambulatory Visit (HOSPITAL_COMMUNITY)
Admission: RE | Admit: 2014-06-19 | Discharge: 2014-06-19 | Disposition: A | Discharge status: Home / Self Care | Payer: Medicare Other | Source: Ambulatory Visit | Attending: Internal Medicine | Admitting: Internal Medicine

## 2014-06-19 ENCOUNTER — Encounter (HOSPITAL_COMMUNITY): Payer: Self-pay | Admitting: Internal Medicine

## 2014-06-19 DIAGNOSIS — Z7982 Long term (current) use of aspirin: Secondary | ICD-10-CM | POA: Diagnosis not present

## 2014-06-19 DIAGNOSIS — R002 Palpitations: Secondary | ICD-10-CM | POA: Diagnosis not present

## 2014-06-19 HISTORY — PX: EP IMPLANTABLE DEVICE: SHX172B

## 2014-06-19 SURGERY — LOOP RECORDER INSERTION

## 2014-06-19 MED ORDER — LIDOCAINE-EPINEPHRINE 1 %-1:100000 IJ SOLN
INTRAMUSCULAR | Status: DC | PRN
  Administered 2014-06-19: 10 mL

## 2014-06-19 MED ORDER — LIDOCAINE-EPINEPHRINE 1 %-1:100000 IJ SOLN
INTRAMUSCULAR | Status: AC
  Filled 2014-06-19: qty 1

## 2014-06-19 SURGICAL SUPPLY — 2 items
LOOP REVEAL LINQSYS (Prosthesis & Implant Heart) ×2 IMPLANT
PACK LOOP INSERTION (CUSTOM PROCEDURE TRAY) ×3 IMPLANT

## 2014-06-19 NOTE — Interval H&P Note (Signed)
History and Physical Interval Note:  06/19/2014 6:43 AM  [name]  has presented today for surgery, with the diagnosis of palp  The various methods of treatment have been discussed with the patient and family. After consideration of risks, benefits and other options for treatment, the patient has consented to  Procedure(s): Loop Recorder Insertion (N/A) as a surgical intervention .  The patient's history has been reviewed, patient examined, no change in status, stable for surgery.  I have reviewed the patient's chart and labs.  Questions were answered to the patient's satisfaction.     Julie Silva

## 2014-06-19 NOTE — H&P (View-Only) (Signed)
HPI [name] is referred today by Dr. Julianne Handler for evaluation of palpitations and NSSVT. The patient has had a h/o palpitations which have worsended over the last few months. Her spells are at times associated with rapid heart beats which are regular and at other times irregular. They last for minutes or less. She has worn a monitor which demonstrates very brief episodes at rates of 150/min.  Allergies  Allergen Reactions  . Codeine Other (See Comments)    Passed out     Current Outpatient Prescriptions  Medication Sig Dispense Refill  . aspirin EC 81 MG tablet Take 81 mg by mouth daily.    . bimatoprost (LUMIGAN) 0.01 % SOLN Place 1 drop into both eyes at bedtime.    . Calcium Carbonate (CALTRATE 600 PO) Take 2 tablets by mouth daily.    . cyclobenzaprine (FLEXERIL) 5 MG tablet Take 0.5 tablets (2.5 mg total) by mouth 2 (two) times daily as needed for muscle spasms. 30 tablet 1  . dorzolamide-timolol (COSOPT) 22.3-6.8 MG/ML ophthalmic solution Place 1 drop into both eyes 2 (two) times daily.     Marland Kitchen estrogens, conjugated, (PREMARIN) 0.625 MG tablet Take 0.3125 mg by mouth daily with supper.    Marland Kitchen ibuprofen (ADVIL,MOTRIN) 200 MG tablet Take 200-400 mg by mouth every 4 (four) hours as needed (pain).    . mirtazapine (REMERON) 15 MG tablet Take 0.5 tablets (7.5 mg total) by mouth at bedtime. 30 tablet 11  . Multiple Vitamin (MULTIVITAMIN WITH MINERALS) TABS tablet Take 1 tablet by mouth daily with lunch. Centrum    . Multiple Vitamins-Minerals (PRESERVISION AREDS) CAPS Take 1 capsule by mouth 2 (two) times daily.    . nitrofurantoin, macrocrystal-monohydrate, (MACROBID) 100 MG capsule Take 100 mg by mouth at bedtime. Continuous course    . omeprazole (PRILOSEC) 20 MG capsule Take 20 mg by mouth daily.    . ondansetron (ZOFRAN-ODT) 8 MG disintegrating tablet Take 1 tablet (8 mg total) by mouth every 8 (eight) hours as needed for nausea. 20 tablet 3  . ranitidine (ZANTAC) 300 MG  tablet Take 1 tablet (300 mg total) by mouth at bedtime. 90 tablet 3  . traMADol (ULTRAM) 50 MG tablet Take 0.5 tablets (25 mg total) by mouth every 12 (twelve) hours as needed. (Patient taking differently: Take 25 mg by mouth every 12 (twelve) hours as needed (pain). ) 30 tablet 0   No current facility-administered medications for this visit.     Past Medical History  Diagnosis Date  . Anemia   . Bleeds easily     "father was a hemophiliac"   . History of blood transfusion 1957    "lots; most related to my periods"  . Migraines     "years ago" (02/04/2013)  . Glaucoma of both eyes   . Arthritis   . Chicken pox   . Diverticulitis   . Urine incontinence   . SVT (supraventricular tachycardia)     ROS:   All systems reviewed and negative except as noted in the HPI.   Past Surgical History  Procedure Laterality Date  . Combined hysterectomy abdominal w/ a&p repair / oophorectomy    . Tonsillectomy  1946  . Appendectomy  1948  . Cholecystectomy  2014  . Vaginal hysterectomy  1968  . Total abdominal hysterectomy    . Dilation and curettage of uterus    . Breast biopsy Bilateral     "both were fine"  . Cataract extraction w/ intraocular  lens implant Left   . Bladder surgery  1995     Family History  Problem Relation Age of Onset  . Stroke Father   . Cancer - Other Sister     Breast  . Cancer - Other Brother     Throat  . Cancer - Other Sister     Throat  . Cancer - Other      Brain     History   Social History  . Marital Status: Widowed    Spouse Name: N/A  . Number of Children: 4  . Years of Education: 12   Occupational History  . Retired-district Network engineer    Social History Main Topics  . Smoking status: Never Smoker   . Smokeless tobacco: Never Used  . Alcohol Use: No  . Drug Use: No  . Sexual Activity: No   Other Topics Concern  . Not on file   Social History Narrative   Lives alone in a one story home.  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       BP 140/86 mmHg  Pulse 88  Ht 5\' 4"  (1.626 m)  Wt 92 lb 9.6 oz (42.003 kg)  BMI 15.89 kg/m2  Physical Exam:  frail appearing elderly woman, NAD HEENT: Unremarkable Neck:  No JVD, no thyromegally Back:  No CVA tenderness Lungs:  Clear with no wheezes HEART:  Regular rate rhythm, no murmurs, no rubs, no clicks Abd:  soft, positive bowel sounds, no organomegally, no rebound, no guarding Ext:  2 plus pulses, no edema, no cyanosis, no clubbing Skin:  No rashes no nodules Neuro:  CN II through XII intact, motor grossly intact  EKG - nsr (reviewed)   Assess/Plan:

## 2014-06-30 ENCOUNTER — Ambulatory Visit (INDEPENDENT_AMBULATORY_CARE_PROVIDER_SITE_OTHER): Payer: Medicare Other | Admitting: *Deleted

## 2014-06-30 DIAGNOSIS — I441 Atrioventricular block, second degree: Secondary | ICD-10-CM

## 2014-06-30 DIAGNOSIS — I471 Supraventricular tachycardia: Secondary | ICD-10-CM

## 2014-07-01 LAB — CUP PACEART INCLINIC DEVICE CHECK: Date Time Interrogation Session: 20160627040500

## 2014-07-01 NOTE — Progress Notes (Signed)
Wound check in clinic s/p ILR implant. Wound well healed without redness or edema. Pt with 0 tachy episodes;  (1) brady episode x 1 min 55 sec---undersensing; 0 asystole; 0 symptom episodes; 0 AF episodes. Plan to continue [facility] f/u QMO and f/u w/ [facility] on 9/20 @ 1415.

## 2014-07-15 ENCOUNTER — Encounter: Payer: Self-pay | Admitting: Internal Medicine

## 2014-07-18 ENCOUNTER — Ambulatory Visit (INDEPENDENT_AMBULATORY_CARE_PROVIDER_SITE_OTHER): Payer: Medicare Other | Admitting: *Deleted

## 2014-07-18 DIAGNOSIS — R002 Palpitations: Secondary | ICD-10-CM | POA: Diagnosis not present

## 2014-07-22 NOTE — Progress Notes (Signed)
Loop recorder 

## 2014-07-23 ENCOUNTER — Encounter: Payer: Self-pay | Admitting: Cardiology

## 2014-07-25 LAB — CUP PACEART REMOTE DEVICE CHECK: Date Time Interrogation Session: 20160722135324

## 2014-07-29 ENCOUNTER — Telehealth: Payer: Self-pay | Admitting: Internal Medicine

## 2014-07-29 NOTE — Telephone Encounter (Signed)
Informed daughter that pt's ILR remotes are now being received. Daughter asked that pt be called if disconnection occurs again. Noted in [place].

## 2014-07-29 NOTE — Telephone Encounter (Signed)
New message     Pt calling in regards to letter received from device clinic.  Pt request that if you do not reach her, please call pt daughter @ [phone] Please call to discuss

## 2014-08-11 ENCOUNTER — Encounter: Payer: Self-pay | Admitting: Internal Medicine

## 2014-08-14 ENCOUNTER — Encounter: Payer: Self-pay | Admitting: Internal Medicine

## 2014-08-18 ENCOUNTER — Ambulatory Visit (INDEPENDENT_AMBULATORY_CARE_PROVIDER_SITE_OTHER): Payer: Medicare Other | Admitting: *Deleted

## 2014-08-18 DIAGNOSIS — R002 Palpitations: Secondary | ICD-10-CM

## 2014-08-19 NOTE — Progress Notes (Signed)
Loop recorder 

## 2014-09-01 ENCOUNTER — Encounter: Payer: Self-pay | Admitting: Cardiovascular Disease

## 2014-09-01 ENCOUNTER — Ambulatory Visit (INDEPENDENT_AMBULATORY_CARE_PROVIDER_SITE_OTHER): Payer: Medicare Other | Admitting: Cardiovascular Disease

## 2014-09-01 VITALS — BP 110/60 | HR 106 | Ht 64.0 in | Wt 90.2 lb

## 2014-09-01 DIAGNOSIS — I471 Supraventricular tachycardia: Secondary | ICD-10-CM

## 2014-09-01 DIAGNOSIS — R072 Precordial pain: Secondary | ICD-10-CM

## 2014-09-01 NOTE — Progress Notes (Signed)
Marland KitchenMarland KitchenMarland KitchenMarland Kitchen..Marland Kitchen   Chief Complaint  Patient presents with  . Bleeding/Bruising    History of Present Illness: 81 yo female with history of SVT, migraines, anemia who is here today for cardiac follow up. I saw her as a new patient 05/14/13 for evaluation of palpitations. She had several episodes where she felt weak and felt her heart racing. She had a cardiac monitor placed 03/06/13-04/05/13 by primary care and had several runs of SVT and possible second degree AV block.. She felt a "funny" feeling in her head but no dizziness.  Echo 05/29/13 with normal LV function, mild AI, mild MR. She had chest pain when climbing stairs. Normal stress myoview June 2016. She was seen by Dr. Lovena Le June 2016 and he arranged a loop recorder which was implanted in June 2016.   She is here today for follow up. She is feeling well overall but still having periods of tachycardia with associated nausea and weakness.   Primary Care Physician: Scarlette Calico  Past Medical History  Diagnosis Date  . Anemia   . Bleeds easily     "father was a hemophiliac"   . History of blood transfusion 1957    "lots; most related to my periods"  . Migraines     "years ago" (02/04/2013)  . Glaucoma of both eyes   . Arthritis   . Chicken pox   . Diverticulitis   . Urine incontinence   . SVT (supraventricular tachycardia)     Past Surgical History  Procedure Laterality Date  . Combined hysterectomy abdominal w/ a&p repair / oophorectomy    . Tonsillectomy  1946  . Appendectomy  1948  . Cholecystectomy  2014  . Vaginal hysterectomy  1968  . Total abdominal hysterectomy    . Dilation and curettage of uterus    . Breast biopsy Bilateral     "both were fine"  . Cataract extraction w/ intraocular lens implant Left   . Bladder surgery  1995  . Ep implantable device N/A 06/19/2014    Procedure: Loop Recorder Insertion;  Surgeon: Evans Lance, MD;  Location: [facility];  Service: Cardiovascular;  Laterality: N/A;    Current  Outpatient Prescriptions  Medication Sig Dispense Refill  . aspirin EC 81 MG tablet Take 81 mg by mouth daily.    . bimatoprost (LUMIGAN) 0.01 % SOLN Place 1 drop into both eyes at bedtime.    . Calcium Carbonate (CALTRATE 600 PO) Take 2 tablets by mouth daily.    . cyclobenzaprine (FLEXERIL) 5 MG tablet Take 0.5 tablets (2.5 mg total) by mouth 2 (two) times daily as needed for muscle spasms. 30 tablet 1  . dorzolamide-timolol (COSOPT) 22.3-6.8 MG/ML ophthalmic solution Place 1 drop into both eyes 2 (two) times daily.     Marland Kitchen estrogens, conjugated, (PREMARIN) 0.625 MG tablet Take 0.3125 mg by mouth daily with supper.    Marland Kitchen ibuprofen (ADVIL,MOTRIN) 200 MG tablet Take 200-400 mg by mouth every 4 (four) hours as needed (pain).    . mirtazapine (REMERON) 15 MG tablet Take 0.5 tablets (7.5 mg total) by mouth at bedtime. 30 tablet 11  . Multiple Vitamin (MULTIVITAMIN WITH MINERALS) TABS tablet Take 1 tablet by mouth daily with lunch. Centrum    . Multiple Vitamins-Minerals (PRESERVISION AREDS) CAPS Take 1 capsule by mouth 2 (two) times daily.    . nitrofurantoin, macrocrystal-monohydrate, (MACROBID) 100 MG capsule Take 100 mg by mouth at bedtime. Continuous course    . omeprazole (PRILOSEC) 20 MG capsule Take  20 mg by mouth daily as needed (acid reflux).     . ondansetron (ZOFRAN-ODT) 8 MG disintegrating tablet Take 1 tablet (8 mg total) by mouth every 8 (eight) hours as needed for nausea. 20 tablet 3  . ranitidine (ZANTAC) 300 MG tablet Take 1 tablet (300 mg total) by mouth at bedtime. 90 tablet 3  . traMADol (ULTRAM) 50 MG tablet Take 0.5 tablets (25 mg total) by mouth every 12 (twelve) hours as needed. 30 tablet 0   No current facility-administered medications for this visit.    Allergies  Allergen Reactions  . Codeine Other (See Comments)    Passed out    Social History   Social History  . Marital Status: Widowed    Spouse Name: N/A  . Number of Children: 4  . Years of Education: 12    Occupational History  . Retired-district Network engineer    Social History Main Topics  . Smoking status: Never Smoker   . Smokeless tobacco: Never Used  . Alcohol Use: No  . Drug Use: No  . Sexual Activity: No   Other Topics Concern  . Not on file   Social History Narrative   Lives alone in a one story home.  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     Family History  Problem Relation Age of Onset  . Stroke Father   . Cancer - Other Sister     Breast  . Cancer - Other Brother     Throat  . Cancer - Other Sister     Throat  . Cancer - Other      Brain    Review of Systems:  As stated in the HPI and otherwise negative.   BP 110/60 mmHg  Pulse 106  Ht 5\' 4"  (1.626 m)  Wt 90 lb 3.2 oz (40.914 kg)  BMI 15.48 kg/m2  SpO2 96%  Physical Examination: General: Well developed, well nourished, NAD HEENT: OP clear, mucus membranes moist SKIN: warm, dry. No rashes. Neuro: No focal deficits Musculoskeletal: Muscle strength 5/5 all ext Psychiatric: Mood and affect normal Neck: No JVD, no carotid bruits, no thyromegaly, no lymphadenopathy. Lungs:Clear bilaterally, no wheezes, rhonci, crackles Cardiovascular: Regular rate and rhythm. No murmurs, gallops or rubs. Abdomen:Soft. Bowel sounds present. Non-tender.  Extremities: No lower extremity edema. Pulses are 2 + in the bilateral DP/PT.  Echo 05/29/13: Left ventricle: The cavity size was normal. Systolic function was normal. The estimated ejection fraction was in the range of 55% to 60%. Wall motion was normal; there were no regional wall motion abnormalities. Left ventricular diastolic function parameters were normal. - Aortic valve: There was mild regurgitation. - Mitral valve: Mild, late systolicprolapse. There was mild regurgitation. - Atrial septum: No defect or patent foramen ovale was identified.  EKG:  EKG is not ordered today. The ekg ordered today demonstrates   Recent Labs: 04/24/2014: BUN 14; Creatinine, Ser  0.69; Hemoglobin 13.0; Magnesium 2.1; Platelets 218.0; Potassium 3.9; Sodium 137; TSH 2.52   Lipid Panel    Component Value Date/Time   CHOL 172 05/07/2013 1045   TRIG 69.0 05/07/2013 1045   HDL 72.80 05/07/2013 1045   CHOLHDL 2 05/07/2013 1045   VLDL 13.8 05/07/2013 1045   LDLCALC 85 05/07/2013 1045     Wt Readings from Last 3 Encounters:  09/01/14 90 lb 3.2 oz (40.914 kg)  06/19/14 94 lb (42.638 kg)  06/17/14 94 lb (42.638 kg)     Other studies Reviewed: Additional studies/ records that were reviewed today  include: . Review of the above records demonstrates:    Assessment and Plan:   1, SVT: She has documented SVT on cardiac monitor in 2015. She had recurrent episodes of dizziness and palpitations and was seen by EP. Echo is normal. A loop recorder has been placed and is being followed by EP.       2. Chest pain: No recurrence. Stress myoview June 2016 with no ischemia. No further ischemic workup.    Current medicines are reviewed at length with the patient today.  The patient does not have concerns regarding medicines.  The following changes have been made:  no change  Labs/ tests ordered today include:   No orders of the defined types were placed in this encounter.    Disposition:   FU with me in 3 months  Signed, Lauree Chandler, MD 09/01/2014 4:39 PM    [facility] [address], [place], [place]  [address] Phone: [phone]; Fax: [phone]

## 2014-09-01 NOTE — Patient Instructions (Signed)
Medication Instructions:  Your physician recommends that you continue on your current medications as directed. Please refer to the Current Medication list given to you today.   Labwork: none  Testing/Procedures: none  Follow-Up: Your physician wants you to follow-up in:  12 months.  You will receive a reminder letter in the mail two months in advance. If you don't receive a letter, please call our office to schedule the follow-up appointment.        

## 2014-09-17 ENCOUNTER — Ambulatory Visit (INDEPENDENT_AMBULATORY_CARE_PROVIDER_SITE_OTHER): Payer: Medicare Other | Admitting: *Deleted

## 2014-09-17 DIAGNOSIS — R002 Palpitations: Secondary | ICD-10-CM | POA: Diagnosis not present

## 2014-09-18 NOTE — Progress Notes (Signed)
Loop recorder 

## 2014-09-23 ENCOUNTER — Ambulatory Visit (INDEPENDENT_AMBULATORY_CARE_PROVIDER_SITE_OTHER): Payer: Medicare Other | Admitting: Internal Medicine

## 2014-09-23 ENCOUNTER — Telehealth: Payer: Self-pay | Admitting: Internal Medicine

## 2014-09-23 ENCOUNTER — Encounter: Payer: Self-pay | Admitting: Internal Medicine

## 2014-09-23 VITALS — BP 158/78 | HR 91 | Ht 64.0 in | Wt 92.2 lb

## 2014-09-23 DIAGNOSIS — I471 Supraventricular tachycardia: Secondary | ICD-10-CM

## 2014-09-23 DIAGNOSIS — I10 Essential (primary) hypertension: Secondary | ICD-10-CM

## 2014-09-23 LAB — CUP PACEART INCLINIC DEVICE CHECK
Date Time Interrogation Session: 20160920151825
Zone Setting Detection Interval: 2000 ms
Zone Setting Detection Interval: 3000 ms
Zone Setting Detection Interval: 520 ms

## 2014-09-23 NOTE — Assessment & Plan Note (Signed)
She has no symptoms currently. Will follow.

## 2014-09-23 NOTE — Telephone Encounter (Signed)
Pt requested high flu shot.  Would this be ok?

## 2014-09-23 NOTE — Telephone Encounter (Signed)
yes

## 2014-09-23 NOTE — Assessment & Plan Note (Signed)
Her systolic blood pressure is elevated but diastolic is normal. I will not change her meds at this time.

## 2014-09-23 NOTE — Patient Instructions (Signed)

## 2014-09-23 NOTE — Progress Notes (Signed)
HPI Julie Silva returns today for followup. She is a pleasant 81 yo woman with palpitations and tachycardia on cardiac monitoring who had an ILR placed several weeks ago. In the interim, she has been stable. She has been asymptomatic. No trouble with healing. She admits to being sedentary. Allergies  Allergen Reactions  . Codeine Other (See Comments)    Passed out     Current Outpatient Prescriptions  Medication Sig Dispense Refill  . aspirin EC 81 MG tablet Take 81 mg by mouth daily.    . bimatoprost (LUMIGAN) 0.01 % SOLN Place 1 drop into both eyes at bedtime.    . Calcium Carbonate (CALTRATE 600 PO) Take 2 tablets by mouth daily.    . cyclobenzaprine (FLEXERIL) 5 MG tablet Take 0.5 tablets (2.5 mg total) by mouth 2 (two) times daily as needed for muscle spasms. 30 tablet 1  . dorzolamide-timolol (COSOPT) 22.3-6.8 MG/ML ophthalmic solution Place 1 drop into both eyes 2 (two) times daily.     Marland Kitchen estrogens, conjugated, (PREMARIN) 0.625 MG tablet Take 0.3125 mg by mouth daily with supper.    Marland Kitchen ibuprofen (ADVIL,MOTRIN) 200 MG tablet Take 200-400 mg by mouth every 4 (four) hours as needed (pain).    . mirtazapine (REMERON) 15 MG tablet Take 0.5 tablets (7.5 mg total) by mouth at bedtime. 30 tablet 11  . Multiple Vitamin (MULTIVITAMIN WITH MINERALS) TABS tablet Take 1 tablet by mouth daily with lunch. Centrum    . Multiple Vitamins-Minerals (PRESERVISION AREDS) CAPS Take 1 capsule by mouth 2 (two) times daily.    . nitrofurantoin, macrocrystal-monohydrate, (MACROBID) 100 MG capsule Take 100 mg by mouth at bedtime. Continuous course    . omeprazole (PRILOSEC) 20 MG capsule Take 20 mg by mouth daily as needed (acid reflux).     . ondansetron (ZOFRAN-ODT) 8 MG disintegrating tablet Take 1 tablet (8 mg total) by mouth every 8 (eight) hours as needed for nausea. 20 tablet 3  . ranitidine (ZANTAC) 300 MG tablet Take 1 tablet (300 mg total) by mouth at bedtime. 90 tablet 3  . traMADol  (ULTRAM) 50 MG tablet Take 0.5 tablets (25 mg total) by mouth every 12 (twelve) hours as needed. 30 tablet 0   No current facility-administered medications for this visit.     Past Medical History  Diagnosis Date  . Anemia   . Bleeds easily     "father was a hemophiliac"   . History of blood transfusion 1957    "lots; most related to my periods"  . Migraines     "years ago" (02/04/2013)  . Glaucoma of both eyes   . Arthritis   . Chicken pox   . Diverticulitis   . Urine incontinence   . SVT (supraventricular tachycardia)     ROS:   All systems reviewed and negative except as noted in the HPI.   Past Surgical History  Procedure Laterality Date  . Combined hysterectomy abdominal w/ a&p repair / oophorectomy    . Tonsillectomy  1946  . Appendectomy  1948  . Cholecystectomy  2014  . Vaginal hysterectomy  1968  . Total abdominal hysterectomy    . Dilation and curettage of uterus    . Breast biopsy Bilateral     "both were fine"  . Cataract extraction w/ intraocular lens implant Left   . Bladder surgery  1995  . Ep implantable device N/A 06/19/2014    Procedure: Loop Recorder Insertion;  Surgeon: Evans Lance, MD;  Location: [facility];  Service: Cardiovascular;  Laterality: N/A;     Family History  Problem Relation Age of Onset  . Stroke Father   . Cancer - Other Sister     Breast  . Cancer - Other Brother     Throat  . Cancer - Other Sister     Throat  . Cancer - Other      Brain     Social History   Social History  . Marital Status: Widowed    Spouse Name: N/A  . Number of Children: 4  . Years of Education: 12   Occupational History  . Retired-district Network engineer    Social History Main Topics  . Smoking status: Never Smoker   . Smokeless tobacco: Never Used  . Alcohol Use: No  . Drug Use: No  . Sexual Activity: No   Other Topics Concern  . Not on file   Social History Narrative   Lives alone in a one story home.  Retired Network engineer.   Has 4 daughters.      BP 158/78 mmHg  Pulse 91  Ht 5\' 4"  (1.626 m)  Wt 92 lb 3.2 oz (41.822 kg)  BMI 15.82 kg/m2  Physical Exam:  Well appearing elderly woman, NAD HEENT: Unremarkable Neck:  No JVD, no thyromegally Lymphatics:  No adenopathy Back:  No CVA tenderness Lungs:  Clear with no wheezes HEART:  Regular rate rhythm, no murmurs, no rubs, no clicks Abd:  soft, positive bowel sounds, no organomegally, no rebound, no guarding Ext:  2 plus pulses, no edema, no cyanosis, no clubbing Skin:  No rashes no nodules Neuro:  CN II through XII intact, motor grossly intact  DEVICE  Normal device function.  See PaceArt for details.   Assess/Plan:

## 2014-09-23 NOTE — Telephone Encounter (Signed)
Patient has nurse visit on 9/22 requesting HIGH dose flu vaccine, are you ok with this---please advise, thanks

## 2014-09-25 ENCOUNTER — Ambulatory Visit (INDEPENDENT_AMBULATORY_CARE_PROVIDER_SITE_OTHER): Payer: Medicare Other

## 2014-09-25 DIAGNOSIS — Z23 Encounter for immunization: Secondary | ICD-10-CM | POA: Diagnosis not present

## 2014-09-25 LAB — CUP PACEART REMOTE DEVICE CHECK: Date Time Interrogation Session: 20160914113512

## 2014-09-25 NOTE — Progress Notes (Signed)
Carelink summary report received. Battery status OK. Normal device function. No new symptom episodes, tachy episodes, brady, or pause episodes. No new AF episodes. Monthly summary reports and ROV with GT in 09/2015. 

## 2014-09-29 ENCOUNTER — Telehealth: Payer: Self-pay | Admitting: *Deleted

## 2014-09-29 LAB — CUP PACEART REMOTE DEVICE CHECK: Date Time Interrogation Session: 20160815110518

## 2014-09-29 NOTE — Progress Notes (Signed)
Carelink summary report received. Battery status OK. Normal device function. No new symptom, brady, pause or AF episodes. 2 tachy episodes, no EGMs available. Monthly summary reports and ROV with GT on 09/23/14 at 2:15pm.

## 2014-09-29 NOTE — Telephone Encounter (Signed)
Called patient to request that she send a manual LINQ transmission.  Walked patient through steps and she successfully sent manual transmission.  Reviewed tachy episodes from 09/26/14, patient asymptomatic other than briefly feeling like she had to cough.  Episodes suggest oversensing (noise) and ST.  Episodes printed for review by Dr. Lovena Le.  Encouraged patient to call with worsening symptoms, questions, or concerns.  Patient voices understanding and denies questions at this time.

## 2014-10-09 ENCOUNTER — Encounter: Payer: Self-pay | Admitting: Internal Medicine

## 2014-10-17 ENCOUNTER — Ambulatory Visit (INDEPENDENT_AMBULATORY_CARE_PROVIDER_SITE_OTHER): Payer: Medicare Other | Admitting: *Deleted

## 2014-10-17 DIAGNOSIS — R002 Palpitations: Secondary | ICD-10-CM

## 2014-10-20 ENCOUNTER — Telehealth: Payer: Self-pay | Admitting: *Deleted

## 2014-10-20 NOTE — Telephone Encounter (Addendum)
Patient's daughter called regarding recent tachy episode that I had just spoken with patient about.  She inquired about rate/duration/rhythm of episode.  Explained that Dr. Lovena Le would have to review episode first but that patient stated that she was asymptomatic.  Patient's daughter agreed that patient had been asymptomatic and that episode was "not like the SVT she had last time".  Explained that Dr. Lovena Le would review the episode when he is back in the office next week and that we will call if he has any additional recommendations.  Patient's daughter appreciative of explanation and agreeable to this plan.

## 2014-10-20 NOTE — Telephone Encounter (Signed)
Opened in error

## 2014-10-20 NOTE — Telephone Encounter (Signed)
Called patient regarding tachy episode on LINQ transmission from 10/17/14. Patient asymptomatic, states she is writing down dates/times of episodes to see if they correlate with episodes that her [facility] records.  Advised that this is appropriate.  Patient aware to call with worsening symptoms, questions, or concerns.  Patient voices understanding of all instructions and denies questions at this time.

## 2014-10-22 NOTE — Progress Notes (Signed)
LOOP RECORDER  

## 2014-10-28 ENCOUNTER — Encounter: Payer: Self-pay | Admitting: Internal Medicine

## 2014-10-29 LAB — CUP PACEART REMOTE DEVICE CHECK: Date Time Interrogation Session: 20161014113657

## 2014-10-29 NOTE — Progress Notes (Signed)
Carelink summary report received. Battery status OK. Normal device function. No new pause, brady, or AF episodes. 1 symptom episode--some artifact, no apparent abnormalities to rhythm. 80 tachy episodes, ECGs suggest oversensing (artifact) and ST/SVT, patient asymptomatic other than feeling like she needed to cough. Monthly summary reports and ROV with GT in 09/2015.

## 2014-10-30 ENCOUNTER — Encounter: Payer: Self-pay | Admitting: Cardiology

## 2014-10-31 ENCOUNTER — Other Ambulatory Visit: Payer: Self-pay

## 2014-10-31 DIAGNOSIS — K2100 Gastro-esophageal reflux disease with esophagitis, without bleeding: Secondary | ICD-10-CM

## 2014-10-31 DIAGNOSIS — R1310 Dysphagia, unspecified: Secondary | ICD-10-CM

## 2014-10-31 MED ORDER — RANITIDINE HCL 300 MG PO TABS: 300.0000 mg | ORAL_TABLET | Freq: Every day | ORAL | Status: DC

## 2014-10-31 NOTE — Telephone Encounter (Signed)
Called Pat to let her know that Dr. Lovena Le reviewed the episode strip and recommended following current plan of care and remote monitoring as her mother was asymptomatic with the event.  [name] states that her mother had another episode this morning and had called to tell her that "the floor felt like it was vibrating under her feet, but then she realized it was her heart beat speeding up".  She states that the patient called her to let her know that she had used her symptom activator and that the episode had resolved and she is eating breakfast now.  I advised that the episode should transmit overnight automatically, but that I would be happy to review it today if she sends a manual Carelink transmission.  Pat agreed to this plan, but states that she would like to wait a few minutes to allow her mother to eat first.  She states she will call me back, or have the patient call me back, if they are unable to walk through the transmission process on their own.

## 2014-10-31 NOTE — Telephone Encounter (Addendum)
Patient called for assistance with manual transmission.  Walked patient through manual transmission steps, transmission successfully received.  Noted tachy episodes from 10/30/14 and today, patient asymptomatic with episodes on 10/27, states that she walked up and down her steps multiple times yesterday.  No symptom episodes (using symptom activator) from today, patient states she realizes now that she forgot to "mash" the button.  She states that the episode happened around 9:06am and reiterated that she felt like the floor was vibrating before noticing that her heart rate was increased.  Patient states that the episode lasted "about 15 minutes".  Reviewed all tachy episodes with strips from today, median V rate 120-122bpm, frequent artifact/oversensing.  Advised patient that I will review strips with Dr. Lovena Le on Monday when he is the office and will call her with any additional recommendations, but that if she has worsening symptoms over the weekend, including dizziness or chest pain, to call emergency services.  Patient requested that I call [name] to update her as well.  Patient voices understanding of all instructions and denies additional questions at this time.  Will call Pat with update.

## 2014-10-31 NOTE — Telephone Encounter (Signed)
Spoke with [name] and discussed the tachy episode and the use of the symptom activator.  Explained that I reviewed all episodes on the manual transmission and that I would review the episodes with Dr. Lovena Le.  I advised that I will call the patient with any additional recommendations.  Pat expressed appreciation and denies any additional questions or concerns at this time.

## 2014-11-03 NOTE — Telephone Encounter (Signed)
Reviewed episode with Dr. Lovena Le, who recommended that patient be seen in office at next available appointment.  Scheduled patient for an appointment with Dr. Lovena Le on 11/10/14 at 8:30am.  Patient aware and agreeable to appointment.  She denies additional questions or concerns at this time.  Patient's daughter, [name], also called to discuss appointment.  Explained that Dr. Lovena Le would like to see her mother in office to discuss her symptoms and her tachy episodes from her LINQ transmissions.  Also requested that patient's daughter contact Carelink tech services to troubleshoot Carelink monitor (has not automatically transmitted since Friday, 10/31/14, when a manual transmission was performed).  Patient's daughter voices understanding of all instructions and denies additional concerns at this time.

## 2014-11-04 ENCOUNTER — Encounter: Payer: Self-pay | Admitting: Internal Medicine

## 2014-11-04 DIAGNOSIS — H4010X0 Unspecified open-angle glaucoma, stage unspecified: Secondary | ICD-10-CM | POA: Diagnosis not present

## 2014-11-10 ENCOUNTER — Encounter: Payer: Self-pay | Admitting: Internal Medicine

## 2014-11-10 ENCOUNTER — Ambulatory Visit (INDEPENDENT_AMBULATORY_CARE_PROVIDER_SITE_OTHER): Payer: Medicare Other | Admitting: Internal Medicine

## 2014-11-10 VITALS — BP 122/64 | HR 56 | Ht 64.0 in | Wt 92.8 lb

## 2014-11-10 DIAGNOSIS — R002 Palpitations: Secondary | ICD-10-CM | POA: Diagnosis not present

## 2014-11-10 DIAGNOSIS — I471 Supraventricular tachycardia: Secondary | ICD-10-CM

## 2014-11-10 LAB — CUP PACEART INCLINIC DEVICE CHECK: Date Time Interrogation Session: 20161107124820

## 2014-11-10 NOTE — Progress Notes (Signed)
HPI Julie Silva returns today for followup. She is a pleasant 81 yo woman with palpitations and tachycardia on cardiac monitoring who had an ILR placed several months ago. In the interim, she has been stable. She had the sensation of palpitations and transmitted a monitor which showed a HR in the 120 range.  She admits to being sedentary. No syncope Allergies  Allergen Reactions  . Codeine Other (See Comments)    Passed out     Current Outpatient Prescriptions  Medication Sig Dispense Refill  . aspirin EC 81 MG tablet Take 81 mg by mouth daily.    . bimatoprost (LUMIGAN) 0.01 % SOLN Place 1 drop into both eyes at bedtime.    . Calcium Carbonate (CALTRATE 600 PO) Take 2 tablets by mouth daily.    . cyclobenzaprine (FLEXERIL) 5 MG tablet Take 0.5 tablets (2.5 mg total) by mouth 2 (two) times daily as needed for muscle spasms. 30 tablet 1  . dorzolamide-timolol (COSOPT) 22.3-6.8 MG/ML ophthalmic solution Place 1 drop into both eyes 2 (two) times daily.     Marland Kitchen estrogens, conjugated, (PREMARIN) 0.625 MG tablet Take 0.3125 mg by mouth daily with supper.    Marland Kitchen ibuprofen (ADVIL,MOTRIN) 200 MG tablet Take 200-400 mg by mouth every 4 (four) hours as needed (pain).    . mirtazapine (REMERON) 15 MG tablet Take 0.5 tablets (7.5 mg total) by mouth at bedtime. 30 tablet 11  . Multiple Vitamin (MULTIVITAMIN WITH MINERALS) TABS tablet Take 1 tablet by mouth daily with lunch. Centrum    . Multiple Vitamins-Minerals (PRESERVISION AREDS) CAPS Take 1 capsule by mouth 2 (two) times daily.    . nitrofurantoin, macrocrystal-monohydrate, (MACROBID) 100 MG capsule Take 100 mg by mouth at bedtime. Continuous course    . omeprazole (PRILOSEC) 20 MG capsule Take 20 mg by mouth daily as needed (acid reflux).     . ondansetron (ZOFRAN-ODT) 8 MG disintegrating tablet Take 1 tablet (8 mg total) by mouth every 8 (eight) hours as needed for nausea. 20 tablet 3  . ranitidine (ZANTAC) 300 MG tablet Take 1 tablet (300 mg  total) by mouth at bedtime. 90 tablet 3  . traMADol (ULTRAM) 50 MG tablet Take 0.5 tablets (25 mg total) by mouth every 12 (twelve) hours as needed. (Patient taking differently: Take 25 mg by mouth every 12 (twelve) hours as needed for moderate pain. ) 30 tablet 0   No current facility-administered medications for this visit.     Past Medical History  Diagnosis Date  . Anemia   . Bleeds easily [facility])     "father was a hemophiliac"   . History of blood transfusion 1957    "lots; most related to my periods"  . Migraines     "years ago" (02/04/2013)  . Glaucoma of both eyes   . Arthritis   . Chicken pox   . Diverticulitis   . Urine incontinence   . SVT (supraventricular tachycardia) (HCC)     ROS:   All systems reviewed and negative except as noted in the HPI.   Past Surgical History  Procedure Laterality Date  . Combined hysterectomy abdominal w/ a&p repair / oophorectomy    . Tonsillectomy  1946  . Appendectomy  1948  . Cholecystectomy  2014  . Vaginal hysterectomy  1968  . Total abdominal hysterectomy    . Dilation and curettage of uterus    . Breast biopsy Bilateral     "both were fine"  . Cataract extraction  w/ intraocular lens implant Left   . Bladder surgery  1995  . Ep implantable device N/A 06/19/2014    Procedure: Loop Recorder Insertion;  Surgeon: Evans Lance, MD;  Location: [place] [facility];  Service: Cardiovascular;  Laterality: N/A;     Family History  Problem Relation Age of Onset  . Stroke Father   . Cancer - Other Sister     Breast  . Cancer - Other Brother     Throat  . Cancer - Other Sister     Throat  . Cancer - Other      Brain     Social History   Social History  . Marital Status: Widowed    Spouse Name: N/A  . Number of Children: 4  . Years of Education: 12   Occupational History  . Retired-district Network engineer    Social History Main Topics  . Smoking status: Never Smoker   . Smokeless tobacco: Never Used  . Alcohol Use: No   . Drug Use: No  . Sexual Activity: No   Other Topics Concern  . Not on file   Social History Narrative   Lives alone in a one story home.  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      BP 122/64 mmHg  Pulse 56  Ht 5\' 4"  (1.626 m)  Wt 92 lb 12.8 oz (42.094 kg)  BMI 15.92 kg/m2  SpO2 92%  Physical Exam:  Well appearing elderly woman, NAD HEENT: Unremarkable Neck:  No JVD, no thyromegally Lymphatics:  No adenopathy Back:  No CVA tenderness Lungs:  Clear with no wheezes HEART:  Regular rate rhythm, no murmurs, no rubs, no clicks Abd:  soft, positive bowel sounds, no organomegally, no rebound, no guarding Ext:  2 plus pulses, no edema, no cyanosis, no clubbing Skin:  No rashes no nodules Neuro:  CN II through XII intact, motor grossly intact  DEVICE  Normal device function.  See PaceArt for details.   Assess/Plan:

## 2014-11-10 NOTE — Assessment & Plan Note (Signed)
Her current episode appears to be due to sinus tachy although I cannot rule out atrial tachycardia. I decided to hold off on AA drugs or calcium channel blocking drugs.

## 2014-11-10 NOTE — Assessment & Plan Note (Signed)
These are mostly well controlled. She remains active. She will avoid caffeine.

## 2014-11-10 NOTE — Patient Instructions (Signed)
Medication Instructions:  Your physician recommends that you continue on your current medications as directed. Please refer to the Current Medication list given to you today.  Labwork: None ordered  Testing/Procedures: None ordered  Follow-Up: Your physician wants you to follow-up in: March/April of 2017 with Dr. Lovena Le.  You will receive a reminder letter in the mail two months in advance. If you don't receive a letter, please call our office to schedule the follow-up appointment.   Any Other Special Instructions Will Be Listed Below (If Applicable). If you need a refill on your cardiac medications before your next appointment, please call your pharmacy.  Thank you for choosing [facility]!!

## 2014-11-13 ENCOUNTER — Telehealth: Payer: Self-pay | Admitting: Cardiology

## 2014-11-13 NOTE — Telephone Encounter (Signed)
LMOVM for pt to return my call. Pt needs to send remote transmission.

## 2014-11-17 ENCOUNTER — Ambulatory Visit (INDEPENDENT_AMBULATORY_CARE_PROVIDER_SITE_OTHER): Payer: Medicare Other | Admitting: *Deleted

## 2014-11-17 DIAGNOSIS — R002 Palpitations: Secondary | ICD-10-CM | POA: Diagnosis not present

## 2014-11-17 NOTE — Progress Notes (Signed)
Carelink summary report / loop recorder 

## 2014-11-21 ENCOUNTER — Telehealth: Payer: Self-pay | Admitting: *Deleted

## 2014-11-21 NOTE — Telephone Encounter (Signed)
Called patient's daughter regarding tachy episodes on patient's LINQ transmissions from 11/13/14 and 11/14/14.  Patient's daughters states that she does not think that her mother was symptomatic, and she states that her mother is now writing down dates/times of episodes if she has any more.  Patient's daughter appreciative of call and denies questions or concerns at this time.

## 2014-12-03 DIAGNOSIS — H401133 Primary open-angle glaucoma, bilateral, severe stage: Secondary | ICD-10-CM | POA: Diagnosis not present

## 2014-12-16 ENCOUNTER — Ambulatory Visit (INDEPENDENT_AMBULATORY_CARE_PROVIDER_SITE_OTHER): Payer: Medicare Other | Admitting: *Deleted

## 2014-12-16 ENCOUNTER — Encounter: Payer: Self-pay | Admitting: Internal Medicine

## 2014-12-16 DIAGNOSIS — R002 Palpitations: Secondary | ICD-10-CM

## 2014-12-16 NOTE — Progress Notes (Signed)
Carelink Summary Report / Loop Recorder 

## 2014-12-22 LAB — CUP PACEART REMOTE DEVICE CHECK: Date Time Interrogation Session: 20161113120700

## 2014-12-22 NOTE — Progress Notes (Deleted)
Carelink summary report received. Battery status OK. Normal device function. No new brady, pause, or AF episodes. 1 symptom episode--likely SVT with artifact. 241 tachy episodes--ECGs suggest SVT with artifact and oversensing, longest 61min 36 sec. Monthly summary reports and ROV with GT in 03/2015.

## 2014-12-22 NOTE — Progress Notes (Signed)
Carelink summary report received. Battery status OK. Normal device function. No new brady, pause, or AF episodes. 1 symptom episode--likely ST/SVT with artifact. 241 tachy episodes--ECGs suggest ST/SVT with artifact and oversensing, longest 48min 36 sec. Monthly summary reports and ROV with GT in 03/2015.

## 2014-12-25 ENCOUNTER — Encounter: Payer: Self-pay | Admitting: Cardiology

## 2015-01-02 ENCOUNTER — Encounter: Payer: Self-pay | Admitting: Internal Medicine

## 2015-01-04 DIAGNOSIS — M71469 Calcium deposit in bursa, unspecified knee: Secondary | ICD-10-CM

## 2015-01-04 HISTORY — DX: Calcium deposit in bursa, unspecified knee: M71.469

## 2015-01-15 ENCOUNTER — Ambulatory Visit (INDEPENDENT_AMBULATORY_CARE_PROVIDER_SITE_OTHER): Payer: Medicare Other | Admitting: *Deleted

## 2015-01-15 DIAGNOSIS — R002 Palpitations: Secondary | ICD-10-CM

## 2015-01-15 NOTE — Progress Notes (Signed)
Carelink Summary Report / Loop Recorder 

## 2015-01-16 ENCOUNTER — Encounter: Payer: Self-pay | Admitting: Internal Medicine

## 2015-02-04 ENCOUNTER — Encounter: Payer: Self-pay | Admitting: Cardiology

## 2015-02-06 ENCOUNTER — Encounter: Payer: Self-pay | Admitting: Internal Medicine

## 2015-02-06 LAB — CUP PACEART REMOTE DEVICE CHECK: Date Time Interrogation Session: 20161209050500

## 2015-02-08 ENCOUNTER — Encounter: Payer: Self-pay | Admitting: Internal Medicine

## 2015-02-11 DIAGNOSIS — H401132 Primary open-angle glaucoma, bilateral, moderate stage: Secondary | ICD-10-CM | POA: Diagnosis not present

## 2015-02-16 ENCOUNTER — Encounter: Payer: Self-pay | Admitting: Internal Medicine

## 2015-02-16 ENCOUNTER — Ambulatory Visit (INDEPENDENT_AMBULATORY_CARE_PROVIDER_SITE_OTHER): Payer: Medicare Other | Admitting: *Deleted

## 2015-02-16 DIAGNOSIS — R002 Palpitations: Secondary | ICD-10-CM | POA: Diagnosis not present

## 2015-02-16 NOTE — Progress Notes (Signed)
Carelink Summary Report / Loop Recorder 

## 2015-03-12 LAB — CUP PACEART REMOTE DEVICE CHECK: Date Time Interrogation Session: 20170112123650

## 2015-03-12 NOTE — Progress Notes (Signed)
Carelink summary report received. Battery status OK. Normal device function. No new symptom episodes, brady, or pause episodes. No new AF episodes. 77 tachy episodes- 2 with ECG's that appear to be ST (122-130 BPM) with noise artifact. Monthly summary reports and ROV/PRN

## 2015-03-16 ENCOUNTER — Ambulatory Visit (INDEPENDENT_AMBULATORY_CARE_PROVIDER_SITE_OTHER): Payer: Medicare Other | Admitting: *Deleted

## 2015-03-16 DIAGNOSIS — R002 Palpitations: Secondary | ICD-10-CM | POA: Diagnosis not present

## 2015-03-16 NOTE — Progress Notes (Signed)
Carelink Summary Report / Loop Recorder 

## 2015-04-01 ENCOUNTER — Encounter: Payer: Self-pay | Admitting: Internal Medicine

## 2015-04-01 ENCOUNTER — Ambulatory Visit (INDEPENDENT_AMBULATORY_CARE_PROVIDER_SITE_OTHER): Payer: Medicare Other | Admitting: Internal Medicine

## 2015-04-01 VITALS — BP 132/76 | HR 92 | Ht 64.0 in | Wt 85.8 lb

## 2015-04-01 DIAGNOSIS — R002 Palpitations: Secondary | ICD-10-CM

## 2015-04-01 DIAGNOSIS — I471 Supraventricular tachycardia: Secondary | ICD-10-CM

## 2015-04-01 LAB — CUP PACEART INCLINIC DEVICE CHECK: Date Time Interrogation Session: 20170329103559

## 2015-04-01 NOTE — Progress Notes (Addendum)
HPI Mrs. Minch returns today for followup. She is a pleasant 82 yo woman with palpitations and tachycardia on cardiac monitoring who had an ILR placed several months ago. In the interim, she has been stable. She had the sensation of palpitations and transmitted a monitor which showed a HR in the 120 range.  She admits to being sedentary. No syncope. She does admit to some weight loss though she denies not eating. Her daughter is with her and notes that she gets full easily. Her palpitations have lately been well controlled. Allergies  Allergen Reactions  . Codeine Other (See Comments)    Passed out     Current Outpatient Prescriptions  Medication Sig Dispense Refill  . aspirin EC 81 MG tablet Take 81 mg by mouth daily.    . bimatoprost (LUMIGAN) 0.01 % SOLN Place 1 drop into both eyes at bedtime.    . Calcium Carbonate (CALTRATE 600 PO) Take 2 tablets by mouth daily.    . cyclobenzaprine (FLEXERIL) 5 MG tablet Take 0.5 tablets (2.5 mg total) by mouth 2 (two) times daily as needed for muscle spasms. 30 tablet 1  . dorzolamide-timolol (COSOPT) 22.3-6.8 MG/ML ophthalmic solution Place 1 drop into both eyes 2 (two) times daily.     Marland Kitchen estrogens, conjugated, (PREMARIN) 0.625 MG tablet Take 0.3125 mg by mouth daily with supper.    Marland Kitchen ibuprofen (ADVIL,MOTRIN) 200 MG tablet Take 200-400 mg by mouth every 4 (four) hours as needed (pain).    . mirtazapine (REMERON) 15 MG tablet Take 0.5 tablets (7.5 mg total) by mouth at bedtime. 30 tablet 11  . Multiple Vitamin (MULTIVITAMIN WITH MINERALS) TABS tablet Take 1 tablet by mouth daily with lunch. Centrum    . Multiple Vitamins-Minerals (PRESERVISION AREDS) CAPS Take 1 capsule by mouth 2 (two) times daily.    . nitrofurantoin, macrocrystal-monohydrate, (MACROBID) 100 MG capsule Take 100 mg by mouth at bedtime. Continuous course    . omeprazole (PRILOSEC) 20 MG capsule Take 20 mg by mouth daily as needed (acid reflux).     . ondansetron (ZOFRAN-ODT)  8 MG disintegrating tablet Take 1 tablet (8 mg total) by mouth every 8 (eight) hours as needed for nausea. 20 tablet 3  . ranitidine (ZANTAC) 300 MG tablet Take 1 tablet (300 mg total) by mouth at bedtime. 90 tablet 3  . traMADol (ULTRAM) 50 MG tablet Take 50 mg by mouth every 12 (twelve) hours as needed for moderate pain.     No current facility-administered medications for this visit.     Past Medical History  Diagnosis Date  . Anemia   . Bleeds easily [facility])     "father was a hemophiliac"   . History of blood transfusion 1957    "lots; most related to my periods"  . Migraines     "years ago" (02/04/2013)  . Glaucoma of both eyes   . Arthritis   . Chicken pox   . Diverticulitis   . Urine incontinence   . SVT (supraventricular tachycardia) (HCC)     ROS:   All systems reviewed and negative except as noted in the HPI.   Past Surgical History  Procedure Laterality Date  . Combined hysterectomy abdominal w/ a&p repair / oophorectomy    . Tonsillectomy  1946  . Appendectomy  1948  . Cholecystectomy  2014  . Vaginal hysterectomy  1968  . Total abdominal hysterectomy    . Dilation and curettage of uterus    . Breast biopsy  Bilateral     "both were fine"  . Cataract extraction w/ intraocular lens implant Left   . Bladder surgery  1995  . Ep implantable device N/A 06/19/2014    Procedure: Loop Recorder Insertion;  Surgeon: Evans Lance, MD;  Location: [place] [facility];  Service: Cardiovascular;  Laterality: N/A;     Family History  Problem Relation Age of Onset  . Stroke Father   . Cancer - Other Sister     Breast  . Cancer - Other Brother     Throat  . Cancer - Other Sister     Throat  . Cancer - Other      Brain     Social History   Social History  . Marital Status: Widowed    Spouse Name: N/A  . Number of Children: 4  . Years of Education: 12   Occupational History  . Retired-district Network engineer    Social History Main Topics  . Smoking status:  Never Smoker   . Smokeless tobacco: Never Used  . Alcohol Use: No  . Drug Use: No  . Sexual Activity: No   Other Topics Concern  . Not on file   Social History Narrative   Lives alone in a one story home.  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      BP 132/76 mmHg  Pulse 92  Ht 5\' 4"  (1.626 m)  Wt 85 lb 12.8 oz (38.919 kg)  BMI 14.72 kg/m2  Physical Exam:  Well appearing elderly woman, NAD HEENT: Unremarkable Neck:  No JVD, no thyromegally Lymphatics:  No adenopathy Back:  No CVA tenderness Lungs:  Clear with no wheezes HEART:  Regular rate rhythm, no murmurs, no rubs, no clicks Abd:  soft, positive bowel sounds, no organomegally, no rebound, no guarding Ext:  2 plus pulses, no edema, no cyanosis, no clubbing Skin:  No rashes no nodules Neuro:  CN II through XII intact, motor grossly intact  DEVICE  Normal device function.  See PaceArt for details.   ECG - NSR  Assess/Plan: 1. Palpitations - her symptoms are fairly well controlled. She has episodes of tachycardia into the 120's and it is still unclear if this represents sinus tachy or atrial tachy although I suspect the former. 2. ILR - her device is working normally. Minimal oversensing. 3. Weight loss - we discussed the importance of taking in more calories.   Mikle Bosworth.D.

## 2015-04-01 NOTE — Patient Instructions (Signed)
Medication Instructions:   Your physician recommends that you continue on your current medications as directed. Please refer to the Current Medication list given to you today.   Labwork:  none  Testing/Procedures:  none  Follow-Up:  Continue with monthly summary reports.  Your physician wants you to follow-up in: 6 months with Dr. Lovena Le. You will receive a reminder letter in the mail two months in advance. If you don't receive a letter, please call our office to schedule the follow-up appointment.   Any Other Special Instructions Will Be Listed Below (If Applicable).     If you need a refill on your cardiac medications before your next appointment, please call your pharmacy.

## 2015-04-09 ENCOUNTER — Encounter: Payer: Self-pay | Admitting: Internal Medicine

## 2015-04-09 ENCOUNTER — Other Ambulatory Visit (INDEPENDENT_AMBULATORY_CARE_PROVIDER_SITE_OTHER): Payer: Medicare Other

## 2015-04-09 ENCOUNTER — Ambulatory Visit (INDEPENDENT_AMBULATORY_CARE_PROVIDER_SITE_OTHER)
Admission: RE | Admit: 2015-04-09 | Discharge: 2015-04-09 | Disposition: A | Discharge status: Home / Self Care | Payer: Medicare Other | Source: Ambulatory Visit | Attending: Internal Medicine | Admitting: Internal Medicine

## 2015-04-09 ENCOUNTER — Ambulatory Visit (INDEPENDENT_AMBULATORY_CARE_PROVIDER_SITE_OTHER): Payer: Medicare Other | Admitting: Internal Medicine

## 2015-04-09 VITALS — BP 130/68 | HR 84 | Temp 97.7°F | Resp 16 | Ht 64.0 in | Wt 85.0 lb

## 2015-04-09 DIAGNOSIS — J189 Pneumonia, unspecified organism: Secondary | ICD-10-CM

## 2015-04-09 DIAGNOSIS — R7309 Other abnormal glucose: Secondary | ICD-10-CM

## 2015-04-09 DIAGNOSIS — R8290 Unspecified abnormal findings in urine: Secondary | ICD-10-CM

## 2015-04-09 DIAGNOSIS — E785 Hyperlipidemia, unspecified: Secondary | ICD-10-CM | POA: Diagnosis not present

## 2015-04-09 DIAGNOSIS — R05 Cough: Secondary | ICD-10-CM | POA: Diagnosis not present

## 2015-04-09 DIAGNOSIS — R634 Abnormal weight loss: Secondary | ICD-10-CM

## 2015-04-09 DIAGNOSIS — R059 Cough, unspecified: Secondary | ICD-10-CM

## 2015-04-09 DIAGNOSIS — R8299 Other abnormal findings in urine: Secondary | ICD-10-CM

## 2015-04-09 DIAGNOSIS — N63 Unspecified lump in breast: Secondary | ICD-10-CM

## 2015-04-09 DIAGNOSIS — N6320 Unspecified lump in the left breast, unspecified quadrant: Secondary | ICD-10-CM | POA: Insufficient documentation

## 2015-04-09 DIAGNOSIS — I10 Essential (primary) hypertension: Secondary | ICD-10-CM

## 2015-04-09 DIAGNOSIS — N6310 Unspecified lump in the right breast, unspecified quadrant: Secondary | ICD-10-CM | POA: Insufficient documentation

## 2015-04-09 LAB — COMPREHENSIVE METABOLIC PANEL
ALT: 11 U/L (ref 0–35)
AST: 21 U/L (ref 0–37)
Albumin: 3.9 g/dL (ref 3.5–5.2)
Alkaline Phosphatase: 100 U/L (ref 39–117)
BUN: 16 mg/dL (ref 6–23)
CO2: 30 mEq/L (ref 19–32)
Calcium: 9.9 mg/dL (ref 8.4–10.5)
Chloride: 102 mEq/L (ref 96–112)
Creatinine, Ser: 0.76 mg/dL (ref 0.40–1.20)
GFR: 76.72 mL/min (ref >60.00)
Glucose, Bld: 99 mg/dL (ref 70–99)
Potassium: 5.2 mEq/L — ABNORMAL HIGH (ref 3.5–5.1)
Sodium: 140 mEq/L (ref 135–145)
Total Bilirubin: 0.6 mg/dL (ref 0.2–1.2)
Total Protein: 7.2 g/dL (ref 6.0–8.3)

## 2015-04-09 LAB — CBC WITH DIFFERENTIAL/PLATELET
Basophils Absolute: 0.1 10*3/uL (ref 0.0–0.1)
Basophils Relative: 1.1 % (ref 0.0–3.0)
Eosinophils Absolute: 0.1 10*3/uL (ref 0.0–0.7)
Eosinophils Relative: 1 % (ref 0.0–5.0)
HCT: 38.1 % (ref 36.0–46.0)
Hemoglobin: 12.6 g/dL (ref 12.0–15.0)
Lymphocytes Relative: 18.8 % (ref 12.0–46.0)
Lymphs Abs: 1.3 10*3/uL (ref 0.7–4.0)
MCHC: 33.2 g/dL (ref 30.0–36.0)
MCV: 92.5 fl (ref 78.0–100.0)
Monocytes Absolute: 0.6 10*3/uL (ref 0.1–1.0)
Monocytes Relative: 8.6 % (ref 3.0–12.0)
Neutro Abs: 4.8 10*3/uL (ref 1.4–7.7)
Neutrophils Relative %: 70.5 % (ref 43.0–77.0)
Platelets: 310 10*3/uL (ref 150.0–400.0)
RBC: 4.12 Mil/uL (ref 3.87–5.11)
RDW: 13.4 % (ref 11.5–15.5)
WBC: 6.9 10*3/uL (ref 4.0–10.5)

## 2015-04-09 LAB — T3 UPTAKE: T3 Uptake: 25 % (ref 22–35)

## 2015-04-09 LAB — LIPID PANEL
Cholesterol: 173 mg/dL (ref 0–200)
HDL: 62.3 mg/dL (ref >39.00)
LDL Cholesterol: 86 mg/dL (ref 0–99)
NonHDL: 110.3
Total CHOL/HDL Ratio: 3
Triglycerides: 124 mg/dL (ref 0.0–149.0)
VLDL: 24.8 mg/dL (ref 0.0–40.0)

## 2015-04-09 LAB — LIPASE: Lipase: 50 U/L (ref 11.0–59.0)

## 2015-04-09 LAB — TSH: TSH: 2.8 u[IU]/mL (ref 0.35–4.50)

## 2015-04-09 LAB — T4, FREE: Free T4: 1.13 ng/dL (ref 0.60–1.60)

## 2015-04-09 LAB — T3: T3, Total: 116 ng/dL (ref 76–181)

## 2015-04-09 NOTE — Patient Instructions (Signed)

## 2015-04-09 NOTE — Progress Notes (Signed)
Pre visit review using our clinic review tool, if applicable. No additional management support is needed unless otherwise documented below in the visit note. 

## 2015-04-09 NOTE — Progress Notes (Signed)
Subjective:  Patient ID: Julie Silva, female    DOB: [birth year]  Age: 82 y.o. MRN: [id number]  CC: Cough   HPI Julie Silva presents for multiple complaints. Julie Silva has lost weight over the last year. Julie Silva lives alone and prepares her own food. Julie Silva complains of early satiety but offers no other specific symptoms related to this. Julie Silva also complains of bloody discharge from her right nipple for several weeks. Julie Silva has not palpated her breast to see if there are any masses. Julie Silva has had an intermittent nonproductive cough for several weeks.  Outpatient Prescriptions Prior to Visit  Medication Sig Dispense Refill  . aspirin EC 81 MG tablet Take 81 mg by mouth daily.    . bimatoprost (LUMIGAN) 0.01 % SOLN Place 1 drop into both eyes at bedtime.    . Calcium Carbonate (CALTRATE 600 PO) Take 2 tablets by mouth daily.    . dorzolamide-timolol (COSOPT) 22.3-6.8 MG/ML ophthalmic solution Place 1 drop into both eyes 2 (two) times daily.     . mirtazapine (REMERON) 15 MG tablet Take 0.5 tablets (7.5 mg total) by mouth at bedtime. 30 tablet 11  . Multiple Vitamin (MULTIVITAMIN WITH MINERALS) TABS tablet Take 1 tablet by mouth daily with lunch. Centrum    . Multiple Vitamins-Minerals (PRESERVISION AREDS) CAPS Take 1 capsule by mouth 2 (two) times daily.    Marland Kitchen omeprazole (PRILOSEC) 20 MG capsule Take 20 mg by mouth daily as needed (acid reflux).     . ranitidine (ZANTAC) 300 MG tablet Take 1 tablet (300 mg total) by mouth at bedtime. 90 tablet 3  . traMADol (ULTRAM) 50 MG tablet Take 50 mg by mouth every 12 (twelve) hours as needed for moderate pain.    . cyclobenzaprine (FLEXERIL) 5 MG tablet Take 0.5 tablets (2.5 mg total) by mouth 2 (two) times daily as needed for muscle spasms. 30 tablet 1  . estrogens, conjugated, (PREMARIN) 0.625 MG tablet Take 0.3125 mg by mouth daily with supper.    Marland Kitchen ibuprofen (ADVIL,MOTRIN) 200 MG tablet Take 200-400 mg by mouth every 4 (four) hours as needed (pain).    . ondansetron  (ZOFRAN-ODT) 8 MG disintegrating tablet Take 1 tablet (8 mg total) by mouth every 8 (eight) hours as needed for nausea. 20 tablet 3  . nitrofurantoin, macrocrystal-monohydrate, (MACROBID) 100 MG capsule Take 100 mg by mouth at bedtime. Continuous course     No facility-administered medications prior to visit.    ROS Review of Systems  Constitutional: Positive for appetite change and unexpected weight change. Negative for fever, chills, diaphoresis, activity change and fatigue.  HENT: Negative.  Negative for trouble swallowing.   Eyes: Negative.   Respiratory: Positive for cough. Negative for choking, chest tightness, shortness of breath, wheezing and stridor.   Cardiovascular: Negative.  Negative for chest pain, palpitations and leg swelling.  Gastrointestinal: Negative.  Negative for nausea, vomiting, abdominal pain, diarrhea, constipation and blood in stool.  Endocrine: Negative.   Genitourinary: Negative.  Negative for dysuria, frequency, hematuria and difficulty urinating.  Musculoskeletal: Negative.  Negative for myalgias, back pain, joint swelling and arthralgias.  Skin: Negative.  Negative for color change, pallor and rash.  Allergic/Immunologic: Negative.   Neurological: Negative.  Negative for dizziness, weakness and light-headedness.  Hematological: Negative.  Negative for adenopathy. Does not bruise/bleed easily.  Psychiatric/Behavioral: Negative.     Objective:  BP 130/68 mmHg  Pulse 84  Temp(Src) 97.7 F (36.5 C) (Oral)  Resp 16  Ht 5\' 4"  (1.626 m)  Wt 85 lb (38.556 kg)  BMI 14.58 kg/m2  SpO2 99%  BP Readings from Last 3 Encounters:  04/09/15 130/68  04/01/15 132/76  11/10/14 122/64    Wt Readings from Last 3 Encounters:  04/09/15 85 lb (38.556 kg)  04/01/15 85 lb 12.8 oz (38.919 kg)  11/10/14 92 lb 12.8 oz (42.094 kg)    Physical Exam  Constitutional: Julie Silva is oriented to person, place, and time.  Non-toxic appearance. Julie Silva does not have a sickly appearance.  Julie Silva does not appear ill. No distress.  HENT:  Mouth/Throat: Oropharynx is clear and moist. No oropharyngeal exudate.  Eyes: Conjunctivae are normal. Right eye exhibits no discharge. Left eye exhibits no discharge. No scleral icterus.  Neck: Normal range of motion. Neck supple. No JVD present. No tracheal deviation present. No thyromegaly present.  Cardiovascular: Normal rate, regular rhythm, normal heart sounds and intact distal pulses.  Exam reveals no gallop and no friction rub.   No murmur heard. Pulmonary/Chest: Effort normal and breath sounds normal. No stridor. No respiratory distress. Julie Silva has no decreased breath sounds. Julie Silva has no wheezes. Julie Silva has no rhonchi. Julie Silva has no rales. Julie Silva exhibits no mass, no tenderness and no deformity. Right breast exhibits mass. Right breast exhibits no inverted nipple, no nipple discharge, no skin change and no tenderness. Left breast exhibits mass. Left breast exhibits no inverted nipple, no nipple discharge, no skin change and no tenderness. Breasts are symmetrical.    Abdominal: Soft. Bowel sounds are normal. Julie Silva exhibits no distension and no mass. There is no tenderness. There is no rebound and no guarding.  Musculoskeletal: Normal range of motion. Julie Silva exhibits no edema or tenderness.  Lymphadenopathy:    Julie Silva has no cervical adenopathy.  Neurological: Julie Silva is oriented to person, place, and time.  Skin: Skin is warm and dry. No rash noted. Julie Silva is not diaphoretic. No erythema. No pallor.  Vitals reviewed.   Lab Results  Component Value Date   WBC 6.9 04/09/2015   HGB 12.6 04/09/2015   HCT 38.1 04/09/2015   PLT 310.0 04/09/2015   GLUCOSE 99 04/09/2015   CHOL 173 04/09/2015   TRIG 124.0 04/09/2015   HDL 62.30 04/09/2015   LDLCALC 86 04/09/2015   ALT 11 04/09/2015   AST 21 04/09/2015   NA 140 04/09/2015   K 5.2* 04/09/2015   CL 102 04/09/2015   CREATININE 0.76 04/09/2015   BUN 16 04/09/2015   CO2 30 04/09/2015   TSH 2.80 04/09/2015   INR 0.9  07/03/2012   HGBA1C 5.5 05/07/2013    No results found.  Assessment & Plan:   Julie Silva was seen today for cough.  Diagnoses and all orders for this visit:  Hyperlipidemia with target LDL less than 130 -     Lipid panel; Future -     TSH; Future  Essential hypertension, benign- her blood pressure is well-controlled, electrolytes and renal function are stable. -     Comprehensive metabolic panel; Future  Other abnormal glucose- improvement noted -     Comprehensive metabolic panel; Future  Weight loss, unintentional- lab work does not indicate any secondary metabolic causes for weight loss and or deficiencies related to weight loss, will treat the pneumonia and evaluate for possible breast cancer, the patient and her daughter were advised regarding 3 meals a day plus insured 2-3 times a day. Will reevaluate this in the next month or 2 and proceed further if indicated. -     Comprehensive metabolic panel; Future -  CBC with Differential/Platelet; Future -     Urinalysis, Routine w reflex microscopic (not at [facility]); Future -     TSH; Future -     T4, free; Future -     T3; Future -     T3 uptake; Future -     Lipase; Future  Cough- her chest is headaches abnormal, radiologist indicates it looks like there is patchy infiltrate consistent with pneumonia -     DG Chest 2 View; Future  Breast mass, right- diagnostic mammo and ultrasound has been ordered to screen for breast cancer -     US BREAST LTD UNI RIGHT INC AXILLA; Future -     MM DIAG BREAST TOMO BILATERAL; Future  Breast mass, left- diagnostic mammo and ultrasound has been ordered to screen for breast cancer -     US BREAST LTD UNI LEFT INC AXILLA; Future -     MM DIAG BREAST TOMO BILATERAL; Future  CAP (community acquired pneumonia)- I will treat with Avelox -     moxifloxacin (AVELOX) 400 MG tablet; Take 1 tablet (400 mg total) by mouth daily at 8 pm.   I have discontinued Ms. Benedick's estrogens (conjugated),  nitrofurantoin (macrocrystal-monohydrate), ibuprofen, cyclobenzaprine, and ondansetron. I am also having her start on moxifloxacin. Additionally, I am having her maintain her aspirin EC, PRESERVISION AREDS, bimatoprost, multivitamin with minerals, mirtazapine, dorzolamide-timolol, omeprazole, Calcium Carbonate (CALTRATE 600 PO), ranitidine, and traMADol.  Meds ordered this encounter  Medications  . moxifloxacin (AVELOX) 400 MG tablet    Sig: Take 1 tablet (400 mg total) by mouth daily at 8 pm.    Dispense:  7 tablet    Refill:  1     Follow-up: Return in about 4 weeks (around 05/07/2015).  Scarlette Calico, MD

## 2015-04-10 ENCOUNTER — Encounter: Payer: Self-pay | Admitting: Internal Medicine

## 2015-04-10 DIAGNOSIS — J189 Pneumonia, unspecified organism: Secondary | ICD-10-CM | POA: Insufficient documentation

## 2015-04-10 LAB — URINALYSIS, ROUTINE W REFLEX MICROSCOPIC
Bilirubin Urine: NEGATIVE
Hgb urine dipstick: NEGATIVE
Ketones, ur: NEGATIVE
Leukocytes, UA: NEGATIVE
Nitrite: NEGATIVE
RBC / HPF: NONE SEEN (ref 0–?)
Specific Gravity, Urine: 1.01 (ref 1.000–1.030)
Total Protein, Urine: NEGATIVE
Urine Glucose: NEGATIVE
Urobilinogen, UA: 0.2 (ref 0.0–1.0)
WBC, UA: NONE SEEN (ref 0–?)
pH: 6.5 (ref 5.0–8.0)

## 2015-04-10 MED ORDER — MOXIFLOXACIN HCL 400 MG PO TABS: 400.0000 mg | ORAL_TABLET | Freq: Every day | ORAL | Status: AC

## 2015-04-15 ENCOUNTER — Ambulatory Visit (INDEPENDENT_AMBULATORY_CARE_PROVIDER_SITE_OTHER): Payer: Medicare Other | Admitting: *Deleted

## 2015-04-15 DIAGNOSIS — R002 Palpitations: Secondary | ICD-10-CM | POA: Diagnosis not present

## 2015-04-15 NOTE — Progress Notes (Signed)
Carelink Summary Report / Loop Recorder 

## 2015-04-16 LAB — HM MAMMOGRAPHY

## 2015-04-17 ENCOUNTER — Other Ambulatory Visit: Payer: Self-pay | Admitting: Internal Medicine

## 2015-04-17 ENCOUNTER — Ambulatory Visit
Admission: RE | Admit: 2015-04-17 | Discharge: 2015-04-17 | Disposition: A | Discharge status: Home / Self Care | Payer: Medicare Other | Source: Ambulatory Visit | Attending: Internal Medicine | Admitting: Internal Medicine

## 2015-04-17 DIAGNOSIS — N63 Unspecified lump in breast: Secondary | ICD-10-CM | POA: Diagnosis not present

## 2015-04-17 DIAGNOSIS — N6452 Nipple discharge: Secondary | ICD-10-CM | POA: Diagnosis not present

## 2015-04-17 DIAGNOSIS — N6310 Unspecified lump in the right breast, unspecified quadrant: Secondary | ICD-10-CM

## 2015-04-17 DIAGNOSIS — R921 Mammographic calcification found on diagnostic imaging of breast: Secondary | ICD-10-CM | POA: Diagnosis not present

## 2015-04-17 DIAGNOSIS — N6320 Unspecified lump in the left breast, unspecified quadrant: Secondary | ICD-10-CM

## 2015-04-20 ENCOUNTER — Telehealth: Payer: Self-pay | Admitting: Internal Medicine

## 2015-04-20 NOTE — Telephone Encounter (Signed)
Please advise 

## 2015-04-20 NOTE — Telephone Encounter (Signed)
I need to recheck her chest x-ray in the next week or 2

## 2015-04-20 NOTE — Telephone Encounter (Signed)
Pt called in said that she wants to know if pneumonia is gone.  Does she need to come back in or just have another xray done?

## 2015-04-21 NOTE — Telephone Encounter (Signed)
Pt daughter called back to follow up on when pt needs to come back in. I advised according to Dr Ronnald Ramp note pt needs to come back in for chest xray in the next week or 2. Order is needed and she will check Mychart. Or call daughter @ [phone]. Thank you!

## 2015-04-22 NOTE — Telephone Encounter (Signed)
Julie Silva called both numbers and LMOVM to schedule an appt regardin this for f.u

## 2015-04-26 LAB — CUP PACEART REMOTE DEVICE CHECK: Date Time Interrogation Session: 20170211123555

## 2015-04-26 NOTE — Progress Notes (Signed)
Carelink summary report received. Battery status OK. Normal device function. No new symptom episodes, brady, or pause episodes. No new AF episodes. 48 tachy- 2 with available ECGs previously addressed. Monthly summary reports and ROV/PRN

## 2015-04-27 ENCOUNTER — Other Ambulatory Visit: Payer: Self-pay | Admitting: Internal Medicine

## 2015-04-27 ENCOUNTER — Other Ambulatory Visit: Payer: Medicare Other

## 2015-04-27 DIAGNOSIS — N6310 Unspecified lump in the right breast, unspecified quadrant: Secondary | ICD-10-CM

## 2015-04-28 ENCOUNTER — Ambulatory Visit
Admission: RE | Admit: 2015-04-28 | Discharge: 2015-04-28 | Disposition: A | Discharge status: Home / Self Care | Payer: Medicare Other | Source: Ambulatory Visit | Attending: Internal Medicine | Admitting: Internal Medicine

## 2015-04-28 DIAGNOSIS — N63 Unspecified lump in breast: Secondary | ICD-10-CM | POA: Diagnosis not present

## 2015-04-28 DIAGNOSIS — N6310 Unspecified lump in the right breast, unspecified quadrant: Secondary | ICD-10-CM

## 2015-04-28 DIAGNOSIS — D241 Benign neoplasm of right breast: Secondary | ICD-10-CM | POA: Diagnosis not present

## 2015-04-29 ENCOUNTER — Encounter: Payer: Self-pay | Admitting: Internal Medicine

## 2015-04-29 ENCOUNTER — Ambulatory Visit (INDEPENDENT_AMBULATORY_CARE_PROVIDER_SITE_OTHER): Payer: Medicare Other | Admitting: Internal Medicine

## 2015-04-29 ENCOUNTER — Ambulatory Visit (INDEPENDENT_AMBULATORY_CARE_PROVIDER_SITE_OTHER)
Admission: RE | Admit: 2015-04-29 | Discharge: 2015-04-29 | Disposition: A | Discharge status: Home / Self Care | Payer: Medicare Other | Source: Ambulatory Visit | Attending: Internal Medicine | Admitting: Internal Medicine

## 2015-04-29 VITALS — BP 126/70 | HR 80 | Temp 97.6°F | Resp 16 | Ht 64.0 in | Wt 85.0 lb

## 2015-04-29 DIAGNOSIS — J984 Other disorders of lung: Secondary | ICD-10-CM | POA: Diagnosis not present

## 2015-04-29 DIAGNOSIS — M7122 Synovial cyst of popliteal space [Baker], left knee: Secondary | ICD-10-CM

## 2015-04-29 DIAGNOSIS — M7120 Synovial cyst of popliteal space [Baker], unspecified knee: Secondary | ICD-10-CM | POA: Insufficient documentation

## 2015-04-29 DIAGNOSIS — D0510 Intraductal carcinoma in situ of unspecified breast: Secondary | ICD-10-CM | POA: Insufficient documentation

## 2015-04-29 DIAGNOSIS — J189 Pneumonia, unspecified organism: Secondary | ICD-10-CM

## 2015-04-29 DIAGNOSIS — M79605 Pain in left leg: Secondary | ICD-10-CM | POA: Diagnosis not present

## 2015-04-29 DIAGNOSIS — M7989 Other specified soft tissue disorders: Secondary | ICD-10-CM

## 2015-04-29 DIAGNOSIS — D0511 Intraductal carcinoma in situ of right breast: Secondary | ICD-10-CM

## 2015-04-29 DIAGNOSIS — M79662 Pain in left lower leg: Secondary | ICD-10-CM

## 2015-04-29 DIAGNOSIS — R918 Other nonspecific abnormal finding of lung field: Secondary | ICD-10-CM | POA: Insufficient documentation

## 2015-04-29 NOTE — Progress Notes (Signed)
Pre visit review using our clinic review tool, if applicable. No additional management support is needed unless otherwise documented below in the visit note. 

## 2015-04-29 NOTE — Patient Instructions (Signed)
Baker Cyst °A Baker cyst is a sac-like structure that forms in the back of the knee. It is filled with the same fluid that is located in your knee. This fluid lubricates the bones and cartilage of the knee and allows them to move over each other more easily. °CAUSES  °When the knee becomes injured or inflamed, increased fluid forms in the knee. When this happens, the joint lining is pushed out behind the knee and forms the Baker cyst. This cyst may also be caused by inflammation from arthritic conditions and infections. °SIGNS AND SYMPTOMS  °A Baker cyst usually has no symptoms. When the cyst is substantially enlarged: °· You may feel pressure behind the knee, stiffness in the knee, or a mass in the area behind the knee. °· You may develop pain, redness, and swelling in the calf.  This can suggest a blood clot and requires evaluation by your health care provider. °DIAGNOSIS  °A Baker cyst is most often found during an ultrasound exam. This exam may have been performed for other reasons, and the cyst was found incidentally. Sometimes an MRI is used. This picks up other problems within a joint that an ultrasound exam may not. If the Baker cyst developed immediately after an injury, X-ray exams may be used to diagnose the cyst. °TREATMENT  °The treatment depends on the cause of the cyst. Anti-inflammatory medicines and rest often will be prescribed. If the cyst is caused by a bacterial infection, antibiotic medicines may be prescribed.  °HOME CARE INSTRUCTIONS  °· If the cyst was caused by an injury, for the first 24 hours, keep the injured leg elevated on 2 pillows while lying down. °· For the first 24 hours while you are awake, apply ice to the injured area: °¨ Put ice in a plastic bag. °¨ Place a towel between your skin and the bag. °¨ Leave the ice on for 20 minutes, 2-3 times a day. °· Only take over-the-counter or prescription medicines for pain, discomfort, or fever as directed by your health care  provider. °· Only take antibiotic medicine as directed. Make sure to finish it even if you start to feel better. °MAKE SURE YOU:  °· Understand these instructions. °· Will watch your condition. °· Will get help right away if you are not doing well or get worse. °  °This information is not intended to replace advice given to you by your health care provider. Make sure you discuss any questions you have with your health care provider. °  °Document Released: 12/20/2004 Document Revised: 10/10/2012 Document Reviewed: 08/01/2012 °Elsevier Interactive Patient Education ©2016 [employer] Inc[employer] ° °

## 2015-04-30 ENCOUNTER — Other Ambulatory Visit: Payer: Self-pay | Admitting: Internal Medicine

## 2015-04-30 DIAGNOSIS — M7989 Other specified soft tissue disorders: Secondary | ICD-10-CM

## 2015-04-30 DIAGNOSIS — M79605 Pain in left leg: Secondary | ICD-10-CM

## 2015-04-30 NOTE — Progress Notes (Signed)
Subjective:  Patient ID: Julie Silva, female    DOB: 1927/09/23  Age: 82 y.o. MRN: [id number]  CC: Cough and Knee Pain   HPI Julie Silva presents for Follow-up on cough, breast mass, and a new complaint of pain and swelling in left knee into the left lower extremity.  She tells me her cough is better but she still occasionally coughs up thick/white phlegm. She denies shortness of breath, night sweats, fever, chills, weight loss, or hemoptysis.  The right breast mass was found to be an intraductal papilloma that needs to be removed surgically.  She also complains of pain and swelling in the left knee and left calf for the last 5 days. She has not noticed any trauma or injury. She is taking Tylenol for symptom relief and doesn't request anything stronger than that for relief from the discomfort.  Outpatient Prescriptions Prior to Visit  Medication Sig Dispense Refill  . aspirin EC 81 MG tablet Take 81 mg by mouth daily.    . bimatoprost (LUMIGAN) 0.01 % SOLN Place 1 drop into both eyes at bedtime.    . Calcium Carbonate (CALTRATE 600 PO) Take 2 tablets by mouth daily.    . dorzolamide-timolol (COSOPT) 22.3-6.8 MG/ML ophthalmic solution Place 1 drop into both eyes 2 (two) times daily.     . mirtazapine (REMERON) 15 MG tablet Take 0.5 tablets (7.5 mg total) by mouth at bedtime. 30 tablet 11  . Multiple Vitamin (MULTIVITAMIN WITH MINERALS) TABS tablet Take 1 tablet by mouth daily with lunch. Centrum    . Multiple Vitamins-Minerals (PRESERVISION AREDS) CAPS Take 1 capsule by mouth 2 (two) times daily.    Marland Kitchen omeprazole (PRILOSEC) 20 MG capsule Take 20 mg by mouth daily as needed (acid reflux).     . ranitidine (ZANTAC) 300 MG tablet Take 1 tablet (300 mg total) by mouth at bedtime. 90 tablet 3  . traMADol (ULTRAM) 50 MG tablet Take 50 mg by mouth every 12 (twelve) hours as needed for moderate pain.     No facility-administered medications prior to visit.    ROS Review of Systems    Constitutional: Negative.  Negative for fever, chills, diaphoresis, appetite change and fatigue.  HENT: Negative.   Eyes: Negative.   Respiratory: Positive for cough. Negative for choking, chest tightness, shortness of breath and stridor.   Cardiovascular: Positive for leg swelling. Negative for chest pain and palpitations.  Gastrointestinal: Negative.  Negative for nausea, vomiting, abdominal pain, diarrhea, constipation and blood in stool.  Endocrine: Negative.   Genitourinary: Negative.   Musculoskeletal: Positive for arthralgias.  Skin: Negative.  Negative for color change, pallor and rash.  Allergic/Immunologic: Negative.   Neurological: Negative.  Negative for dizziness, weakness and light-headedness.  Hematological: Negative.  Negative for adenopathy. Does not bruise/bleed easily.  Psychiatric/Behavioral: Negative.     Objective:  BP 126/70 mmHg  Pulse 80  Temp(Src) 97.6 F (36.4 C) (Oral)  Resp 16  Ht 5\' 4"  (1.626 m)  Wt 85 lb (38.556 kg)  BMI 14.58 kg/m2  SpO2 96%  BP Readings from Last 3 Encounters:  04/29/15 126/70  04/09/15 130/68  04/01/15 132/76    Wt Readings from Last 3 Encounters:  04/29/15 85 lb (38.556 kg)  04/09/15 85 lb (38.556 kg)  04/01/15 85 lb 12.8 oz (38.919 kg)    Physical Exam  Constitutional: She is oriented to person, place, and time. No distress.  HENT:  Mouth/Throat: Oropharynx is clear and moist. No oropharyngeal exudate.  Eyes:  Conjunctivae are normal. Right eye exhibits no discharge. Left eye exhibits no discharge. No scleral icterus.  Neck: Normal range of motion. Neck supple. No JVD present. No tracheal deviation present. No thyromegaly present.  Cardiovascular: Normal rate, regular rhythm, normal heart sounds and intact distal pulses.  Exam reveals no gallop and no friction rub.   No murmur heard. Pulmonary/Chest: Effort normal and breath sounds normal. No stridor. No respiratory distress. She has no wheezes. She has no rales. She  exhibits no tenderness.  Abdominal: Soft. Bowel sounds are normal. She exhibits no distension and no mass. There is no tenderness. There is no rebound and no guarding.  Musculoskeletal: Normal range of motion. She exhibits edema (there is trace edema in left lower extremity). She exhibits no tenderness.       Left knee: She exhibits deformity (there is a small Baker's cyst behind the left knee). She exhibits normal range of motion, no swelling, no effusion and normal alignment. No tenderness found.  Lymphadenopathy:    She has no cervical adenopathy.  Neurological: She is oriented to person, place, and time.  Skin: Skin is warm and dry. No rash noted. She is not diaphoretic. No erythema. No pallor.  Vitals reviewed.   Lab Results  Component Value Date   WBC 6.9 04/09/2015   HGB 12.6 04/09/2015   HCT 38.1 04/09/2015   PLT 310.0 04/09/2015   GLUCOSE 99 04/09/2015   CHOL 173 04/09/2015   TRIG 124.0 04/09/2015   HDL 62.30 04/09/2015   LDLCALC 86 04/09/2015   ALT 11 04/09/2015   AST 21 04/09/2015   NA 140 04/09/2015   K 5.2* 04/09/2015   CL 102 04/09/2015   CREATININE 0.76 04/09/2015   BUN 16 04/09/2015   CO2 30 04/09/2015   TSH 2.80 04/09/2015   INR 0.9 07/03/2012   HGBA1C 5.5 05/07/2013    Dg Chest 2 View  04/29/2015  CLINICAL DATA:  Follow-up pulmonary scarring EXAM: CHEST  2 VIEW COMPARISON:  04/09/2015 FINDINGS: Cardiac shadow is stable. The lungs are again hyperinflated consistent with COPD. Patchy densities are again noted throughout both lungs. A loop recorder is noted over the anterior chest. No acute infiltrate or sizable effusion is seen. IMPRESSION: Patchy densities within both lungs relatively stable from the prior exam. These changes may simply represent scarring. CT of the chest without contrast is recommended to assess these areas as a baseline examination. Electronically Signed   By: Inez Catalina M.D.   On: 04/29/2015 14:13    Assessment & Plan:   Avary was seen  today for cough and knee pain.  Diagnoses and all orders for this visit:  CAP (community acquired pneumonia)- based on symptoms she feels better after a course of Avelox. Her chest x-ray has not improved, will get a CT scan to see if there is concern for metastatic disease/BOOP/persistent pneumonia/interstitial fibrosis. -     DG Chest 2 View; Future -     CT Chest Wo Contrast; Future  Intraductal papillary adenocarcinoma of female breast, right -     Ambulatory referral to General Surgery  Baker's cyst of knee, left -     Ambulatory referral to Sports Medicine  Pain and swelling of left lower leg- we'll screen for deep venous thrombosis with an ultrasound -     Cancel: LE VENOUS; Future  Abnormal chest x-ray with multiple lung nodules -     CT Chest Wo Contrast; Future   I am having Ms. Pargas maintain her aspirin  EC, PRESERVISION AREDS, bimatoprost, multivitamin with minerals, mirtazapine, dorzolamide-timolol, omeprazole, Calcium Carbonate (CALTRATE 600 PO), ranitidine, and traMADol.  No orders of the defined types were placed in this encounter.     Follow-up: Return if symptoms worsen or fail to improve.  Scarlette Calico, MD

## 2015-05-01 ENCOUNTER — Ambulatory Visit (INDEPENDENT_AMBULATORY_CARE_PROVIDER_SITE_OTHER)
Admission: RE | Admit: 2015-05-01 | Discharge: 2015-05-01 | Disposition: A | Discharge status: Home / Self Care | Payer: Medicare Other | Source: Ambulatory Visit | Attending: Internal Medicine | Admitting: Internal Medicine

## 2015-05-01 ENCOUNTER — Ambulatory Visit (HOSPITAL_COMMUNITY)
Admission: RE | Admit: 2015-05-01 | Discharge: 2015-05-01 | Disposition: A | Discharge status: Home / Self Care | Payer: Medicare Other | Source: Ambulatory Visit | Attending: Internal Medicine | Admitting: Internal Medicine

## 2015-05-01 ENCOUNTER — Telehealth: Payer: Self-pay | Admitting: Cardiology

## 2015-05-01 DIAGNOSIS — J189 Pneumonia, unspecified organism: Secondary | ICD-10-CM | POA: Diagnosis not present

## 2015-05-01 DIAGNOSIS — M7989 Other specified soft tissue disorders: Secondary | ICD-10-CM | POA: Diagnosis not present

## 2015-05-01 DIAGNOSIS — M79605 Pain in left leg: Secondary | ICD-10-CM

## 2015-05-01 DIAGNOSIS — R918 Other nonspecific abnormal finding of lung field: Secondary | ICD-10-CM | POA: Diagnosis not present

## 2015-05-01 NOTE — Telephone Encounter (Signed)
Spoke w/ pt and requested that she send a manual transmission b/c her home monitor has not updated in at least 14 days.   

## 2015-05-02 ENCOUNTER — Encounter: Payer: Self-pay | Admitting: Internal Medicine

## 2015-05-05 ENCOUNTER — Encounter: Payer: Self-pay | Admitting: Internal Medicine

## 2015-05-05 ENCOUNTER — Ambulatory Visit: Payer: Self-pay | Admitting: Surgery

## 2015-05-05 ENCOUNTER — Other Ambulatory Visit: Payer: Self-pay | Admitting: Internal Medicine

## 2015-05-05 DIAGNOSIS — D241 Benign neoplasm of right breast: Secondary | ICD-10-CM | POA: Diagnosis not present

## 2015-05-05 DIAGNOSIS — M25562 Pain in left knee: Secondary | ICD-10-CM | POA: Diagnosis not present

## 2015-05-07 NOTE — Patient Instructions (Addendum)
Casmira S Pressman  05/07/2015   Your procedure is scheduled on: 05-13-15  Report to [facility]  Entrance take [facility]  elevators to 3rd floor to  [place] at [place]  AM.  Call this number if you have problems the morning of surgery [phone]   Remember: ONLY 1 PERSON MAY GO WITH YOU TO SHORT STAY TO GET  READY MORNING OF [place].  Do not eat food or drink liquids :After Midnight.     Take these medicines the morning of surgery with A SIP OF WATER: None. Use eye drops-usual. DO NOT TAKE ANY DIABETIC MEDICATIONS DAY OF YOUR SURGERY                               You may not have any metal on your body including hair pins and              piercings  Do not wear jewelry, make-up, lotions, powders or perfumes, deodorant             Do not wear nail polish.  Do not shave  48 hours prior to surgery.              Men may shave face and neck.   Do not bring valuables to the hospital. [place].  Contacts, dentures or bridgework may not be worn into surgery.  Leave suitcase in the car. After surgery it may be brought to your room.     Patients discharged the day of surgery will not be allowed to drive home.  Name and phone number of your driver: Rosiland Oz -daughter [phone] cell  Special Instructions: N/A              Please read over the following fact sheets you were given: _____________________________________________________________________             [facility] - Preparing for Surgery Before surgery, you can play an important role.  Because skin is not sterile, your skin needs to be as free of germs as possible.  You can reduce the number of germs on your skin by washing with CHG (chlorahexidine gluconate) soap before surgery.  CHG is an antiseptic cleaner which kills germs and bonds with the skin to continue killing germs even after washing. Please DO NOT use if you have an allergy to CHG or  antibacterial soaps.  If your skin becomes reddened/irritated stop using the CHG and inform your nurse when you arrive at Short Stay. Do not shave (including legs and underarms) for at least 48 hours prior to the first CHG shower.  You may shave your face/neck. Please follow these instructions carefully:  1.  Shower with CHG Soap the night before surgery and the  morning of Surgery.  2.  If you choose to wash your hair, wash your hair first as usual with your  normal  shampoo.  3.  After you shampoo, rinse your hair and body thoroughly to remove the  shampoo.                           4.  Use CHG as you would any other liquid soap.  You can apply chg  directly  to the skin and wash                       Gently with a scrungie or clean washcloth.  5.  Apply the CHG Soap to your body ONLY FROM THE NECK DOWN.   Do not use on face/ open                           Wound or open sores. Avoid contact with eyes, ears mouth and genitals (private parts).                       Wash face,  Genitals (private parts) with your normal soap.             6.  Wash thoroughly, paying special attention to the area where your surgery  will be performed.  7.  Thoroughly rinse your body with warm water from the neck down.  8.  DO NOT shower/wash with your normal soap after using and rinsing off  the CHG Soap.                9.  Pat yourself dry with a clean towel.            10.  Wear clean pajamas.            11.  Place clean sheets on your bed the night of your first shower and do not  sleep with pets. Day of Surgery : Do not apply any lotions/deodorants the morning of surgery.  Please wear clean clothes to the hospital/surgery center.  FAILURE TO FOLLOW THESE INSTRUCTIONS MAY RESULT IN THE CANCELLATION OF YOUR SURGERY PATIENT SIGNATURE_________________________________  NURSE SIGNATURE__________________________________  ________________________________________________________________________

## 2015-05-08 ENCOUNTER — Encounter (HOSPITAL_COMMUNITY): Payer: Self-pay | Admitting: *Deleted

## 2015-05-08 ENCOUNTER — Encounter (HOSPITAL_COMMUNITY)
Admission: RE | Admit: 2015-05-08 | Discharge: 2015-05-08 | Disposition: A | Discharge status: Home / Self Care | Payer: Medicare Other | Source: Ambulatory Visit | Attending: Surgery | Admitting: Surgery

## 2015-05-08 ENCOUNTER — Encounter (HOSPITAL_COMMUNITY): Payer: Self-pay

## 2015-05-08 DIAGNOSIS — Z01812 Encounter for preprocedural laboratory examination: Secondary | ICD-10-CM | POA: Insufficient documentation

## 2015-05-08 DIAGNOSIS — Z01810 Encounter for preprocedural cardiovascular examination: Secondary | ICD-10-CM | POA: Insufficient documentation

## 2015-05-08 HISTORY — DX: Other specified health status: Z78.9

## 2015-05-08 HISTORY — DX: Unspecified hearing loss, bilateral: H91.93

## 2015-05-08 HISTORY — DX: Nausea with vomiting, unspecified: R11.2

## 2015-05-08 HISTORY — DX: Urinary tract infection, site not specified: N39.0

## 2015-05-08 HISTORY — DX: Other specified postprocedural states: Z98.890

## 2015-05-08 HISTORY — DX: Malignant (primary) neoplasm, unspecified: C80.1

## 2015-05-08 HISTORY — DX: Synovial cyst of popliteal space (Baker), unspecified knee: M71.20

## 2015-05-08 HISTORY — DX: Cardiac arrhythmia, unspecified: I49.9

## 2015-05-08 HISTORY — DX: Dizziness and giddiness: R42

## 2015-05-08 LAB — CBC
HCT: 40.7 % (ref 36.0–46.0)
Hemoglobin: 13 g/dL (ref 12.0–15.0)
MCH: 30.9 pg (ref 26.0–34.0)
MCHC: 31.9 g/dL (ref 30.0–36.0)
MCV: 96.7 fL (ref 78.0–100.0)
Platelets: 307 10*3/uL (ref 150–400)
RBC: 4.21 MIL/uL (ref 3.87–5.11)
RDW: 13.5 % (ref 11.5–15.5)
WBC: 9.9 10*3/uL (ref 4.0–10.5)

## 2015-05-08 NOTE — Pre-Procedure Instructions (Addendum)
05-08-15 [id number] Dr. Marcell Barlow given review of history and pt daughter concern about CT Chest review- which Dr. Marcell Barlow reviewed, Pt in no respiratory distress, no cough, was treated 2 weeks ago for pneumonia x7 days antibiotic by Dr. Coral Ceo- who ordered CT Chest. Dr. Marcell Barlow feels surgery may proceed as planned. Daughter and patient should follow up with Dr. Ronnald Ramp with further concerns of CT Chest results. EKG 3'17, Echo 3'16, Stress 6'16, CXR 4'17, CT Chest 05-01-15 epic.

## 2015-05-12 ENCOUNTER — Encounter (HOSPITAL_COMMUNITY): Payer: Self-pay | Admitting: Surgery

## 2015-05-12 DIAGNOSIS — D241 Benign neoplasm of right breast: Secondary | ICD-10-CM | POA: Diagnosis present

## 2015-05-12 NOTE — H&P (Signed)
General Surgery [facility]  Julie Silva 05/05/2015 9:50 AM Location: [facility] Patient #: [id number] DOB: February 08, 1927 [name] / Language: Julie Silva / Race: White Female   History of Present Illness Julie Regal MD; 05/05/2015 10:20 AM) Patient words: breast mass.  The patient is a 82 year old female who presents with a complaint of Breast problems.   Patient is referred by Dr. Scarlette Calico for evaluation of newly diagnosed right breast intraductal papilloma. Patient developed a nipple discharge on the right approximately 6 weeks ago. Patient noted dark, rusty colored fluid on multiple occasions. Patient has had previous breast biopsies bilaterally. Patient underwent mammogram followed by ultrasound followed by ultrasound-guided needle biopsy. This was performed on April 28, 2015 and demonstrated an intraductal papilloma. There were dilated ducts and a 10 mm mass in the subareolar position between 6:00 and 8:00 on the right breast. Patient denies any previous history of breast cancer. Patient is known to me from previous cholecystectomy performed by me in 2011.   Other Problems Julie Silva, RMA; 05/05/2015 9:50 AM) Arthritis Bladder Problems Cancer Gastroesophageal Reflux Disease Kidney Stone Lump In Breast Transfusion history  Past Surgical History Julie Silva, RMA; 05/05/2015 9:50 AM) Appendectomy Breast Biopsy Bilateral. multiple Cataract Surgery Left. Gallbladder Surgery - Laparoscopic Hysterectomy (not due to cancer) - Complete Tonsillectomy  Diagnostic Studies History Julie Silva, RMA; 05/05/2015 9:50 AM) Colonoscopy 5-10 years ago Mammogram within last year Pap Smear 1-5 years ago  Allergies Julie Silva, RMA; 05/05/2015 9:51 AM) Codeine Sulfate *ANALGESICS - OPIOID*  Medication History Julie Silva, RMA; 05/05/2015 9:54 AM) Dorzolamide HCl-Timolol Mal (22.3-6.8MG /ML Solution, Ophthalmic) Active. Lumigan (0.01%  Solution, Ophthalmic) Active. Moxifloxacin HCl (400MG  Tablet, Oral) Active. Aspirin (81MG  Tablet Chewable, Oral) Active. Calcium (500MG  Tablet, Oral) Active. Remeron (15MG  Tablet, Oral) Active. Multi Vitamin Daily (Oral) Active. PriLOSEC (20MG  Capsule DR, Oral) Active. Zantac (300MG  Tablet, Oral) Active.  Social History Julie Silva, RMA; 05/05/2015 9:50 AM) Caffeine use Coffee, Tea. No alcohol use No drug use Tobacco use Never smoker.  Family History Julie Silva, RMA; 05/05/2015 9:50 AM) Bleeding disorder Father. Breast Cancer Sister. Cancer Brother, Sister. Cerebrovascular Accident Father. Ovarian Cancer Mother.  Pregnancy / Birth History Julie Silva, RMA; 05/05/2015 9:50 AM) Age at menarche 31 years. Age of menopause <47 Gravida 63 Maternal age 8-25 Para 4    Review of Systems Julie Silva RMA; 05/05/2015 9:50 AM) General Present- Appetite Loss and Weight Loss. Not Present- Chills, Fatigue, Fever, Night Sweats and Weight Gain. Skin Not Present- Change in Wart/Mole, Dryness, Hives, Jaundice, New Lesions, Non-Healing Wounds, Rash and Ulcer. HEENT Present- Hearing Loss, Seasonal Allergies and Wears glasses/contact lenses. Not Present- Earache, Hoarseness, Nose Bleed, Oral Ulcers, Ringing in the Ears, Sinus Pain, Sore Throat, Visual Disturbances and Yellow Eyes. Respiratory Not Present- Bloody sputum, Chronic Cough, Difficulty Breathing, Snoring and Wheezing. Breast Present- Breast Mass and Nipple Discharge. Not Present- Breast Pain and Skin Changes. Cardiovascular Present- Palpitations, Rapid Heart Rate, Shortness of Breath and Swelling of Extremities. Not Present- Chest Pain, Difficulty Breathing Lying Down and Leg Cramps. Gastrointestinal Not Present- Abdominal Pain, Bloating, Bloody Stool, Change in Bowel Habits, Chronic diarrhea, Constipation, Difficulty Swallowing, Excessive gas, Gets full quickly at meals, Hemorrhoids, Indigestion, Nausea, Rectal Pain and  Vomiting. Female Genitourinary Not Present- Frequency, Nocturia, Painful Urination, Pelvic Pain and Urgency. Musculoskeletal Present- Joint Pain. Not Present- Back Pain, Joint Stiffness, Muscle Pain, Muscle Weakness and Swelling of Extremities. Neurological Present- Weakness. Not Present- Decreased Memory, Fainting, Headaches, Numbness, Seizures, Tingling,  Tremor and Trouble walking. Psychiatric Not Present- Anxiety, Bipolar, Change in Sleep Pattern, Depression, Fearful and Frequent crying. Endocrine Not Present- Cold Intolerance, Excessive Hunger, Hair Changes, Heat Intolerance, Hot flashes and New Diabetes. Hematology Present- Easy Bruising. Not Present- Excessive bleeding, Gland problems, HIV and Persistent Infections.  Vitals (Robin Silva RMA; 05/05/2015 9:55 AM) 05/05/2015 9:54 AM Weight: 84.8 lb Height: 62in Body Surface Area: 1.33 m Body Mass Index: 15.51 kg/m  Temp.: 96.60F  Pulse: 76 (Regular)  BP: 118/70 (Sitting, Left Arm, Standard)       Physical Exam Julie Regal MD; 05/05/2015 10:18 AM) The physical exam findings are as follows: Note:General - appears comfortable, no distress; not diaphorectic  HEENT - normocephalic; sclerae clear, gaze conjugate; mucous membranes moist, dentition good; voice normal  Neck - symmetric on extension; no palpable anterior or posterior cervical adenopathy; no palpable masses in the thyroid bed  Chest - clear bilaterally without rhonchi, rales, or wheeze  Cor - regular rhythm with normal rate; no significant murmur  Breast - right breast with Steri-Strips in place over a punctate wound in the lateral breast. Steri-Strips are removed. There is ecchymosis extending across the lower portion of the breast. There is no significant tenderness. There is obvious thickening and nodularity in the involved area of the breast in the lower outer quadrant at the edge of the areola. There is no nipple drainage today. There is no axillary  adenopathy on the right. Left breast shows normal nipple areolar complex. There are previous surgical incisions bilaterally. These are well healed. There are no palpable masses on the left other than a small area of nodularity approximately 1 cm in size at the inferior edge of the incision near the areola. This likely represents scar tissue. Left axilla is free of adenopathy.  Ext - non-tender without significant edema or lymphedema  Neuro - grossly intact; no tremor    Assessment & Plan Julie Regal MD; 05/05/2015 10:20 AM) Madelyn Flavors PAPILLOMA OF RIGHT BREAST (D24.1) Current Plans Patient presents with a newly diagnosed intraductal papilloma of the right breast. Excisional biopsy has been recommended.  I have recommended proceeding with excisional biopsy of the involved area of the right breast under anesthesia as an outpatient surgical procedure. We discussed the procedure with the patient and her family. We discussed restrictions on her activities after the surgery. Patient understands and wishes to proceed in the near future.  The risks and benefits of the procedure have been discussed at length with the patient. The patient understands the proposed procedure, potential alternative treatments, and the course of recovery to be expected. All of the patient's questions have been answered at this time. The patient wishes to proceed with surgery.  Julie Regal, MD, [facility] Office: [phone]

## 2015-05-13 ENCOUNTER — Ambulatory Visit (HOSPITAL_COMMUNITY): Payer: Medicare Other | Admitting: Anesthesiology

## 2015-05-13 ENCOUNTER — Encounter (HOSPITAL_COMMUNITY): Payer: Self-pay | Admitting: *Deleted

## 2015-05-13 ENCOUNTER — Encounter (HOSPITAL_COMMUNITY)
Admission: RE | Disposition: A | Discharge status: Home / Self Care | Payer: Self-pay | Source: Ambulatory Visit | Attending: Surgery

## 2015-05-13 ENCOUNTER — Ambulatory Visit (HOSPITAL_COMMUNITY)
Admission: RE | Admit: 2015-05-13 | Discharge: 2015-05-13 | Disposition: A | Discharge status: Home / Self Care | Payer: Medicare Other | Source: Ambulatory Visit | Attending: Surgery | Admitting: Surgery

## 2015-05-13 DIAGNOSIS — Z9049 Acquired absence of other specified parts of digestive tract: Secondary | ICD-10-CM | POA: Insufficient documentation

## 2015-05-13 DIAGNOSIS — K219 Gastro-esophageal reflux disease without esophagitis: Secondary | ICD-10-CM | POA: Diagnosis not present

## 2015-05-13 DIAGNOSIS — Z90710 Acquired absence of both cervix and uterus: Secondary | ICD-10-CM | POA: Insufficient documentation

## 2015-05-13 DIAGNOSIS — Z79899 Other long term (current) drug therapy: Secondary | ICD-10-CM | POA: Diagnosis not present

## 2015-05-13 DIAGNOSIS — N6011 Diffuse cystic mastopathy of right breast: Secondary | ICD-10-CM | POA: Diagnosis not present

## 2015-05-13 DIAGNOSIS — I10 Essential (primary) hypertension: Secondary | ICD-10-CM | POA: Diagnosis not present

## 2015-05-13 DIAGNOSIS — D241 Benign neoplasm of right breast: Secondary | ICD-10-CM | POA: Diagnosis not present

## 2015-05-13 DIAGNOSIS — Z7982 Long term (current) use of aspirin: Secondary | ICD-10-CM | POA: Diagnosis not present

## 2015-05-13 DIAGNOSIS — R921 Mammographic calcification found on diagnostic imaging of breast: Secondary | ICD-10-CM | POA: Diagnosis not present

## 2015-05-13 HISTORY — DX: Dysphagia, unspecified: R13.10

## 2015-05-13 HISTORY — DX: Calcium deposit in bursa, unspecified knee: M71.469

## 2015-05-13 HISTORY — PX: EXCISION OF BREAST BIOPSY: SHX5822

## 2015-05-13 SURGERY — EXCISION OF BREAST BIOPSY
Anesthesia: General | Site: Breast | Laterality: Right

## 2015-05-13 MED ORDER — LACTATED RINGERS IV SOLN
INTRAVENOUS | Status: DC
  Administered 2015-05-13: 08:00:00 via INTRAVENOUS

## 2015-05-13 MED ORDER — BUPIVACAINE-EPINEPHRINE 0.25% -1:200000 IJ SOLN
INTRAMUSCULAR | Status: DC | PRN
  Administered 2015-05-13: 10 mL

## 2015-05-13 MED ORDER — PHENYLEPHRINE HCL 10 MG/ML IJ SOLN
INTRAMUSCULAR | Status: DC | PRN
  Administered 2015-05-13: 40 ug via INTRAVENOUS
  Administered 2015-05-13: 80 ug via INTRAVENOUS

## 2015-05-13 MED ORDER — LIDOCAINE HCL (CARDIAC) 20 MG/ML IV SOLN
INTRAVENOUS | Status: AC
  Filled 2015-05-13: qty 5

## 2015-05-13 MED ORDER — CEFAZOLIN SODIUM-DEXTROSE 2-4 GM/100ML-% IV SOLN
2.0000 g | INTRAVENOUS | Status: AC
  Administered 2015-05-13: 2 g via INTRAVENOUS

## 2015-05-13 MED ORDER — ONDANSETRON HCL 4 MG/2ML IJ SOLN: 4.0000 mg | Freq: Once | INTRAMUSCULAR | Status: DC | PRN

## 2015-05-13 MED ORDER — CEFAZOLIN SODIUM-DEXTROSE 2-4 GM/100ML-% IV SOLN
INTRAVENOUS | Status: AC
  Filled 2015-05-13: qty 100

## 2015-05-13 MED ORDER — ONDANSETRON HCL 4 MG/2ML IJ SOLN
INTRAMUSCULAR | Status: AC
  Filled 2015-05-13: qty 2

## 2015-05-13 MED ORDER — FENTANYL CITRATE (PF) 100 MCG/2ML IJ SOLN
INTRAMUSCULAR | Status: DC | PRN
  Administered 2015-05-13: 25 ug via INTRAVENOUS

## 2015-05-13 MED ORDER — LIDOCAINE HCL (CARDIAC) 20 MG/ML IV SOLN
INTRAVENOUS | Status: DC | PRN
  Administered 2015-05-13: 50 mg via INTRAVENOUS

## 2015-05-13 MED ORDER — SODIUM CHLORIDE 0.9 % IJ SOLN
INTRAMUSCULAR | Status: AC
  Filled 2015-05-13: qty 10

## 2015-05-13 MED ORDER — EPHEDRINE SULFATE 50 MG/ML IJ SOLN
INTRAMUSCULAR | Status: AC
  Filled 2015-05-13: qty 1

## 2015-05-13 MED ORDER — BUPIVACAINE-EPINEPHRINE (PF) 0.25% -1:200000 IJ SOLN
INTRAMUSCULAR | Status: AC
  Filled 2015-05-13: qty 30

## 2015-05-13 MED ORDER — PROPOFOL 10 MG/ML IV BOLUS
INTRAVENOUS | Status: AC
  Filled 2015-05-13: qty 20

## 2015-05-13 MED ORDER — FENTANYL CITRATE (PF) 100 MCG/2ML IJ SOLN: 25.0000 ug | INTRAMUSCULAR | Status: DC | PRN

## 2015-05-13 MED ORDER — ONDANSETRON HCL 4 MG/2ML IJ SOLN
INTRAMUSCULAR | Status: DC | PRN
  Administered 2015-05-13: 4 mg via INTRAVENOUS

## 2015-05-13 MED ORDER — FENTANYL CITRATE (PF) 100 MCG/2ML IJ SOLN
INTRAMUSCULAR | Status: AC
  Filled 2015-05-13: qty 2

## 2015-05-13 MED ORDER — HYDROCODONE-ACETAMINOPHEN 5-325 MG PO TABS: 1.0000 | ORAL_TABLET | ORAL | Status: DC | PRN

## 2015-05-13 MED ORDER — PROPOFOL 10 MG/ML IV BOLUS
INTRAVENOUS | Status: DC | PRN
  Administered 2015-05-13 (×2): 20 mg via INTRAVENOUS
  Administered 2015-05-13: 80 mg via INTRAVENOUS

## 2015-05-13 SURGICAL SUPPLY — 34 items
APL SKNCLS STERI-STRIP NONHPOA (GAUZE/BANDAGES/DRESSINGS)
BENZOIN TINCTURE PRP APPL 2/3 (GAUZE/BANDAGES/DRESSINGS) IMPLANT
BLADE HEX COATED 2.75 (ELECTRODE) ×3 IMPLANT
BLADE SURG 15 STRL LF DISP TIS (BLADE) ×2 IMPLANT
BLADE SURG 15 STRL SS (BLADE) ×3
CLOSURE WOUND 1/2 X4 (GAUZE/BANDAGES/DRESSINGS) ×1
COVER SURGICAL LIGHT HANDLE (MISCELLANEOUS) ×3 IMPLANT
DECANTER SPIKE VIAL GLASS SM (MISCELLANEOUS) ×3 IMPLANT
DRAPE LAPAROSCOPIC ABDOMINAL (DRAPES) IMPLANT
DRAPE LAPAROTOMY TRNSV 102X78 (DRAPE) IMPLANT
ELECT PENCIL ROCKER SW 15FT (MISCELLANEOUS) ×3 IMPLANT
ELECT REM PT RETURN 9FT ADLT (ELECTROSURGICAL) ×3
ELECTRODE REM PT RTRN 9FT ADLT (ELECTROSURGICAL) ×1 IMPLANT
GAUZE SPONGE 4X4 12PLY STRL (GAUZE/BANDAGES/DRESSINGS) ×2 IMPLANT
GAUZE SPONGE 4X4 16PLY XRAY LF (GAUZE/BANDAGES/DRESSINGS) ×3 IMPLANT
GLOVE BIOGEL PI IND STRL 7.0 (GLOVE) ×1 IMPLANT
GLOVE BIOGEL PI INDICATOR 7.0 (GLOVE) ×2
GLOVE SURG ORTHO 8.0 STRL STRW (GLOVE) ×3 IMPLANT
GOWN STRL REUS W/TWL LRG LVL3 (GOWN DISPOSABLE) ×3 IMPLANT
GOWN STRL REUS W/TWL XL LVL3 (GOWN DISPOSABLE) ×6 IMPLANT
KIT BASIN OR (CUSTOM PROCEDURE TRAY) ×3 IMPLANT
MARKER SKIN DUAL TIP RULER LAB (MISCELLANEOUS) ×3 IMPLANT
NDL HYPO 25X1 1.5 SAFETY (NEEDLE) ×1 IMPLANT
NEEDLE HYPO 25X1 1.5 SAFETY (NEEDLE) ×3 IMPLANT
NS IRRIG 1000ML POUR BTL (IV SOLUTION) ×3 IMPLANT
PACK BASIC VI WITH GOWN DISP (CUSTOM PROCEDURE TRAY) ×3 IMPLANT
STRIP CLOSURE SKIN 1/2X4 (GAUZE/BANDAGES/DRESSINGS) ×1 IMPLANT
SUT MNCRL AB 4-0 PS2 18 (SUTURE) ×2 IMPLANT
SUT VIC AB 3-0 SH 27 (SUTURE) ×3
SUT VIC AB 3-0 SH 27XBRD (SUTURE) IMPLANT
SUT VIC AB 4-0 PS2 27 (SUTURE) IMPLANT
SYR CONTROL 10ML LL (SYRINGE) ×3 IMPLANT
TOWEL OR 17X26 10 PK STRL BLUE (TOWEL DISPOSABLE) ×3 IMPLANT
YANKAUER SUCT BULB TIP 10FT TU (MISCELLANEOUS) ×3 IMPLANT

## 2015-05-13 NOTE — Anesthesia Postprocedure Evaluation (Signed)
Anesthesia Post Note  Patient: Julie Silva  Procedure(s) Performed: Procedure(s) (LRB): RIGHT BREAST EXCISIONAL BIOPSY x2 (Right)  Patient location during evaluation: PACU Anesthesia Type: General Level of consciousness: awake and alert Pain management: pain level controlled Vital Signs Assessment: post-procedure vital signs reviewed and stable Respiratory status: spontaneous breathing, nonlabored ventilation, respiratory function stable and patient connected to nasal cannula oxygen Cardiovascular status: blood pressure returned to baseline and stable Postop Assessment: no signs of nausea or vomiting Anesthetic complications: no    Last Vitals:  Filed Vitals:   05/13/15 0945 05/13/15 1000  BP: 128/58 141/61  Pulse: 62 61  Temp: 36.3 C 36.4 C  Resp: 16 16    Last Pain:  Filed Vitals:   05/13/15 1002  PainSc: 0-No pain                 Zenaida Deed

## 2015-05-13 NOTE — Discharge Instructions (Signed)

## 2015-05-13 NOTE — Anesthesia Procedure Notes (Signed)
Procedure Name: LMA Insertion Date/Time: 05/13/2015 8:34 AM Performed by: Glory Buff Pre-anesthesia Checklist: Patient identified, Emergency Drugs available, Suction available and Patient being monitored Patient Re-evaluated:Patient Re-evaluated prior to inductionOxygen Delivery Method: Circle system utilized Preoxygenation: Pre-oxygenation with 100% oxygen Intubation Type: IV induction LMA: LMA inserted LMA Size: 3.0 Number of attempts: 1 Placement Confirmation: positive ETCO2 and breath sounds checked- equal and bilateral Tube secured with: Tape

## 2015-05-13 NOTE — Progress Notes (Signed)
Pt and her daughter present pre-op.  Has bilateral hearing aides, states right side has best hearing.  Pt has left hand and low forearm wrapped from an injury involving the dog and leash.  Daughter states that Pt had difficulty in recovery with past procedures due to strangling and coughing and has had recent CT chest changes.  Discussed importance of deep breathing and coughing post op. Pt In/out self caths at home.

## 2015-05-13 NOTE — Anesthesia Preprocedure Evaluation (Addendum)
Anesthesia Evaluation  Patient identified by MRN, date of birth, ID band Patient awake    Reviewed: Allergy & Precautions, H&P , NPO status , Patient's Chart, lab work & pertinent test results  History of Anesthesia Complications (+) PONV, MALIGNANT HYPERTHERMIA and history of anesthetic complications  Airway Mallampati: II  TM Distance: >3 FB Neck ROM: full    Dental no notable dental hx.    Pulmonary neg pulmonary ROS, pneumonia,    Pulmonary exam normal breath sounds clear to auscultation       Cardiovascular hypertension, Normal cardiovascular exam+ dysrhythmias  Rhythm:regular Rate:Normal  Normal Echo 2016, normal stress test 2016   Neuro/Psych  Headaches, negative neurological ROS     GI/Hepatic negative GI ROS, Neg liver ROS,   Endo/Other  negative endocrine ROS  Renal/GU negative Renal ROS     Musculoskeletal  (+) Arthritis ,   Abdominal   Peds  Hematology negative hematology ROS (+) anemia ,   Anesthesia Other Findings   Reproductive/Obstetrics negative OB ROS                           Anesthesia Physical Anesthesia Plan  ASA: III  Anesthesia Plan: General   Post-op Pain Management:    Induction: Intravenous  Airway Management Planned: LMA  Additional Equipment:   Intra-op Plan:   Post-operative Plan:   Informed Consent: I have reviewed the patients History and Physical, chart, labs and discussed the procedure including the risks, benefits and alternatives for the proposed anesthesia with the patient or authorized representative who has indicated his/her understanding and acceptance.   Dental Advisory Given  Plan Discussed with: Anesthesiologist, CRNA and Surgeon  Anesthesia Plan Comments:         Anesthesia Quick Evaluation

## 2015-05-13 NOTE — Interval H&P Note (Signed)
History and Physical Interval Note:  05/13/2015 7:16 AM  Julie Silva  has presented today for surgery, with the diagnosis of RIGHT BREAST PAPILLOMA.  The various methods of treatment have been discussed with the patient and family. After consideration of risks, benefits and other options for treatment, the patient has consented to    Procedure(s): RIGHT BREAST EXCISIONAL BIOPSY  (Right) as a surgical intervention .    The patient's history has been reviewed, patient examined, no change in status, stable for surgery.  I have reviewed the patient's chart and labs.  Questions were answered to the patient's satisfaction.    [name], MD, [facility] Office: [place]

## 2015-05-13 NOTE — Brief Op Note (Signed)
05/13/2015  9:23 AM  PATIENT:  Julie Silva  82 y.o. female  PRE-OPERATIVE DIAGNOSIS:  RIGHT BREAST PAPILLOMA  POST-OPERATIVE DIAGNOSIS:  RIGHT BREAST PAPILLOMA  PROCEDURE:  Procedure(s): RIGHT BREAST EXCISIONAL BIOPSY x2 (Right)  SURGEON:  Surgeon(s) and Role:    * Armandina Gemma, MD - Primary  ANESTHESIA:   general  EBL:  Total I/O In: 900 [I.V.:900] Out: 5 [Blood:5]  BLOOD ADMINISTERED:none  DRAINS: none   LOCAL MEDICATIONS USED:  MARCAINE     SPECIMEN:  Excision  DISPOSITION OF SPECIMEN:  PATHOLOGY  COUNTS:  YES  TOURNIQUET:  * No tourniquets in log *  DICTATION: .Other Dictation: Dictation Number [id number]  PLAN OF CARE: Discharge to home after PACU  PATIENT DISPOSITION:  PACU - hemodynamically stable.   Delay start of Pharmacological VTE agent (>24hrs) due to surgical blood loss or risk of bleeding: yes  [name], MD, [facility] Office: [phone]

## 2015-05-13 NOTE — Transfer of Care (Signed)
Immediate Anesthesia Transfer of Care Note  Patient: Julie Silva  Procedure(s) Performed: Procedure(s): RIGHT BREAST EXCISIONAL BIOPSY x2 (Right)  Patient Location: PACU  Anesthesia Type:General  Level of Consciousness: awake, alert  and oriented  Airway & Oxygen Therapy: Patient Spontanous Breathing and Patient connected to face mask oxygen  Post-op Assessment: Report given to RN and Post -op Vital signs reviewed and stable  Post vital signs: Reviewed and stable  Last Vitals:  Filed Vitals:   05/13/15 0640  BP: 166/70  Pulse: 85  Temp: 36.6 C  Resp: 16    Last Pain: There were no vitals filed for this visit.    Patients Stated Pain Goal: 5 ([phone] [id number])  Complications: No apparent anesthesia complications

## 2015-05-14 NOTE — Progress Notes (Signed)
Quick Note:  Please contact patient and notify of benign pathology results.  Dreyson Mishkin M. Laynie Espy, MD, FACS [facility] Office: [phone]   ______ 

## 2015-05-14 NOTE — Op Note (Signed)
Julie Silva, HUSK NO.:  [id number]  MEDICAL RECORD NO.:  [id number]  LOCATION:                                FACILITY:  [facility]  PHYSICIAN:  [name], MD      DATE OF BIRTH:  Nov 29, 1927  DATE OF PROCEDURE:  05/13/2015                              OPERATIVE REPORT   PREOPERATIVE DIAGNOSIS:  Right breast intraductal papilloma.  POSTOPERATIVE DIAGNOSIS:  Right breast intraductal papilloma.  PROCEDURE:  1. Excision portion right nipple   2. excisional biopsy, right breast.  SURGEON:  [name], MD, FACS  ANESTHESIA:  General.  ESTIMATED BLOOD LOSS:  Minimal.  PREPARATION:  ChloraPrep.  COMPLICATIONS:  None.  INDICATIONS:  The patient is an 82 year old female, referred by her primary care physician for treatment of newly diagnosed right breast intraductal papilloma.  The patient had noted onset of a right nipple discharge approximately 6-8 weeks prior to surgery.  This had occasionally been rusty in color on occasion.  The patient underwent mammogram followed by ultrasound followed by ultrasound-guided fine- needle aspiration biopsy, all at the [facility]. Pathology showed an intraductal papilloma.  The patient now comes to Surgery for excisional biopsy.  BODY OF REPORT:  Procedure was done in OR #6 at the [facility].  The patient was brought to the operating room, placed in supine position on the operating room table.  Following administration of general anesthesia, the patient was positioned and then prepped and draped in the usual aseptic fashion.  After ascertaining that an adequate level of anesthesia had been achieved, a circumareolar incision was made in the inferolateral portion of the right breast.  Dissection was carried in subcutaneous tissues.  Breast tissue is heterogeneous with multiple cysts and dilated ducts.  There were multiple chocolate cyst present.  There is serous fluid  present. Excisional biopsy was performed removing approximately a 3 x 2 x 2 cm portion of the breast tissue beneath the areola and in the inferolateral portion of the breast.  This was completely excised and submitted to Pathology.  Hemostasis was achieved with electrocautery.  The nipple itself has an abnormality at the inferior portion.  This was excised removing approximately 40% of the nipple.  It is submitted separately for review.  The deep dermis of the nipple was reapproximated with an interrupted 3-0 Vicryl suture.  The skin of the nipple was closed with interrupted 4-0 Monocryl subcuticular suture.  Wound was then irrigated with warm saline which was evacuated.  Local anesthetic was infiltrated circumferentially.  Subcutaneous tissues were closed with interrupted 3- 0 Vicryl sutures.  Skin was closed with a running 4-0 Monocryl subcuticular suture.  Wound was washed and dried and benzoin and Steri- Strips were applied.  Sterile dressings were applied.  The patient was awakened from anesthesia and brought to the recovery room.  The patient tolerated the procedure well.   [name], MD, [facility] Office: [phone]    TMG/MEDQ  D:  05/13/2015  T:  05/14/2015  Job:  [id number]  cc:   Scarlette Calico, MD [place]  [place], [place], MD's Office

## 2015-05-15 ENCOUNTER — Ambulatory Visit (INDEPENDENT_AMBULATORY_CARE_PROVIDER_SITE_OTHER): Payer: Medicare Other | Admitting: *Deleted

## 2015-05-15 DIAGNOSIS — R002 Palpitations: Secondary | ICD-10-CM

## 2015-05-18 NOTE — Progress Notes (Signed)
Carelink Summary Report / Loop Recorder 

## 2015-05-27 LAB — CUP PACEART REMOTE DEVICE CHECK: Date Time Interrogation Session: 20170313123610

## 2015-05-31 LAB — CUP PACEART REMOTE DEVICE CHECK: Date Time Interrogation Session: 20170412130632

## 2015-05-31 NOTE — Progress Notes (Signed)
Carelink summary report received. Battery status OK. Normal device function. No new symptom episodes, brady, or pause episodes. No new AF episodes. 26 tachy episodes, some noise, some ?AT, previously reviewed. Monthly summary reports and ROV/PRN

## 2015-06-15 ENCOUNTER — Encounter: Payer: Medicare Other | Admitting: *Deleted

## 2015-06-19 LAB — CUP PACEART REMOTE DEVICE CHECK: Date Time Interrogation Session: 20170512133554

## 2015-06-23 ENCOUNTER — Telehealth: Payer: Self-pay | Admitting: Internal Medicine

## 2015-06-23 NOTE — Telephone Encounter (Signed)
Pt called in and said that she has some questions about her mirtazapine (REMERON) 15 MG tablet [id number] .  She would like a nurse to give her a call about this med

## 2015-06-24 ENCOUNTER — Encounter: Payer: Self-pay | Admitting: Internal Medicine

## 2015-06-24 ENCOUNTER — Other Ambulatory Visit: Payer: Self-pay | Admitting: Internal Medicine

## 2015-06-24 MED ORDER — MIRTAZAPINE 15 MG PO TABS: 15.0000 mg | ORAL_TABLET | Freq: Every day | ORAL | Status: DC

## 2015-06-25 ENCOUNTER — Other Ambulatory Visit: Payer: Self-pay | Admitting: Internal Medicine

## 2015-06-25 DIAGNOSIS — R918 Other nonspecific abnormal finding of lung field: Secondary | ICD-10-CM

## 2015-06-25 NOTE — Telephone Encounter (Signed)
Pt was calling for refill. Done

## 2015-07-29 ENCOUNTER — Telehealth: Payer: Self-pay | Admitting: Internal Medicine

## 2015-07-31 ENCOUNTER — Ambulatory Visit (INDEPENDENT_AMBULATORY_CARE_PROVIDER_SITE_OTHER)
Admission: RE | Admit: 2015-07-31 | Discharge: 2015-07-31 | Disposition: A | Discharge status: Home / Self Care | Payer: Medicare Other | Source: Ambulatory Visit | Attending: Internal Medicine | Admitting: Internal Medicine

## 2015-07-31 ENCOUNTER — Encounter: Payer: Self-pay | Admitting: Internal Medicine

## 2015-07-31 ENCOUNTER — Other Ambulatory Visit: Payer: Self-pay | Admitting: Internal Medicine

## 2015-07-31 ENCOUNTER — Encounter: Payer: Self-pay | Admitting: Cardiology

## 2015-07-31 DIAGNOSIS — R918 Other nonspecific abnormal finding of lung field: Secondary | ICD-10-CM | POA: Diagnosis not present

## 2015-07-31 NOTE — Telephone Encounter (Signed)
I have sent a Mychart note about the CT scan

## 2015-07-31 NOTE — Telephone Encounter (Signed)
Patient's daughter called in to state that boost you suggested is working well, patient has gained 5 lbs ---and also, she is asking that you be very discreet with how you present findings with her imaging test being performed today---her mother tends to worry about everything and if results of test are not good, she's afraid she will stop eating again---routing to dr Ronnald Ramp, Jul[facility] Rainier.Marland KitchenMarland Kitchen

## 2015-07-31 NOTE — Telephone Encounter (Signed)
Advised pat/daughter of dr Ronnald Ramp note

## 2015-08-12 DIAGNOSIS — H401132 Primary open-angle glaucoma, bilateral, moderate stage: Secondary | ICD-10-CM | POA: Diagnosis not present

## 2015-08-18 ENCOUNTER — Telehealth: Payer: Self-pay | Admitting: Cardiovascular Disease

## 2015-08-18 NOTE — Telephone Encounter (Signed)
Pt advised I will forward to Pat to follow up with her the first of next week about arranging an appointment with Dr Angelena Form sooner than 10/30/15.

## 2015-08-18 NOTE — Telephone Encounter (Signed)
New message       Pt received a recall letter to be seen in august.  I made her an appt for 10-30-15 (next available).  Pt want to know if you can work her in the month of august?  Please call

## 2015-08-24 NOTE — Telephone Encounter (Signed)
Spoke with pt's daughter and appt made for pt to see Dr. Angelena Form on August 24,2017 at 4:00.

## 2015-08-27 ENCOUNTER — Ambulatory Visit (INDEPENDENT_AMBULATORY_CARE_PROVIDER_SITE_OTHER): Payer: Medicare Other | Admitting: Cardiovascular Disease

## 2015-08-27 VITALS — BP 130/50 | HR 72 | Ht 64.0 in | Wt 88.4 lb

## 2015-08-27 DIAGNOSIS — I340 Nonrheumatic mitral (valve) insufficiency: Secondary | ICD-10-CM

## 2015-08-27 DIAGNOSIS — I471 Supraventricular tachycardia: Secondary | ICD-10-CM

## 2015-08-27 NOTE — Patient Instructions (Signed)

## 2015-08-27 NOTE — Progress Notes (Signed)
Marland KitchenMarland KitchenMarland KitchenMarland Kitchen..Marland Kitchen   Chief Complaint  Patient presents with  . Second Degree AV block    History of Present Illness: 82 yo female with history of SVT, migraines, anemia who is here today for cardiac follow up. I saw her as a new patient 05/14/13 for evaluation of palpitations. She had several episodes where she felt weak and felt her heart racing. She had a cardiac monitor placed 03/06/13-04/05/13 by primary care and had several runs of SVT and possible second degree AV block.. She felt a "funny" feeling in her head but no dizziness. No chest pain or SOB. We discussed addition of medical therapy but decided to follow her symptoms. Echo 05/29/13 with normal LV function, mild AI, mild MR. I saw her in August 2016 and she c/o heart racing and then feeling nausea and weakness.  She was referred to EP clinic and saw Dr. Lovena Le. A loop recorder was implanted. Echo March 2016 with normal LV systolic function, mild MR. Nuclear stress test June 2016 with no ischemia.   She is here today for follow up. She is feeling well. No chest pain or dyspnea. No near syncope or syncope.   Primary Care Physician: Scarlette Calico, MD   Past Medical History:  Diagnosis Date  . Anemia   . Arthritis    Osteoporosis  . Bleeds easily [facility])    "father was a hemophiliac" - pt not being bothered in recent years.  . Calcium deposit in bursa of knee 2017  . Cancer ([place])    skin cancer "basal cell".  . Chicken pox   . Cyst, Baker's knee    left knee- tx. Cortisone injection 05-05-15 in office- "improved pain relief and swelling left ankle and knee"  . Diverticulitis   . Dysrhythmia    Dr. Vermell Madrid-cardiology  . Glaucoma of both eyes   . Hearing loss of both ears    Bilateral ears- hearing aids used- right ear hearing betther than left.  . History of blood transfusion 1957   "lots; most related to my periods"-last 1968 -s/p Hysterectomy  . Migraines    "years ago" (02/04/2013)  . PONV (postoperative nausea and vomiting)   .  Self-catheterizes urinary bladder    "daily- retained urine and chronic UTI"  . SVT (supraventricular tachycardia) (HCC)    Dr. Darnell Level. [place]-follows "loop recorder left chest"  . Swallowing difficulty    freq occ. mostley liquids  . Urine incontinence   . UTI (urinary tract infection)    Chronic Urinary tract infections- tx Macrobid daily.  . Vertigo     Past Surgical History:  Procedure Laterality Date  . APPENDECTOMY  1948  . BLADDER SURGERY  1995   Repair-   . BREAST BIOPSY Bilateral    "both were fine"  . CATARACT EXTRACTION W/ INTRAOCULAR LENS IMPLANT Left   . CHOLECYSTECTOMY  2014  . COMBINED HYSTERECTOMY ABDOMINAL W/ A&P REPAIR / OOPHORECTOMY    . DILATION AND CURETTAGE OF UTERUS    . EP IMPLANTABLE DEVICE N/A 06/19/2014   Procedure: Loop Recorder Insertion;  Surgeon: Evans Lance, MD;  Location: [facility];  Service: Cardiovascular;  Laterality: N/A;  . EXCISION OF BREAST BIOPSY Right 05/13/2015   Procedure: RIGHT BREAST EXCISIONAL BIOPSY x2;  Surgeon: Armandina Gemma, MD;  Location: [facility];  Service: General;  Laterality: Right;  . TONSILLECTOMY  1946  . TOTAL ABDOMINAL HYSTERECTOMY    . VAGINAL HYSTERECTOMY  1968    Current Outpatient Prescriptions  Medication Sig Dispense Refill  .  aspirin EC 81 MG tablet Take 81 mg by mouth daily.    . bimatoprost (LUMIGAN) 0.01 % SOLN Place 1 drop into both eyes at bedtime.    . Calcium Carbonate (CALTRATE 600 PO) Take 2 tablets by mouth daily.    . dorzolamide-timolol (COSOPT) 22.3-6.8 MG/ML ophthalmic solution Place 1 drop into both eyes 2 (two) times daily.     Marland Kitchen HYDROcodone-acetaminophen (NORCO/VICODIN) 5-325 MG tablet Take 1-2 tablets by mouth every 4 (four) hours as needed for moderate pain. 20 tablet 0  . mirtazapine (REMERON) 15 MG tablet Take 1 tablet (15 mg total) by mouth at bedtime. 30 tablet 11  . Multiple Vitamins-Minerals (PRESERVISION AREDS) CAPS Take 1 capsule by mouth 2 (two) times daily.    . nitrofurantoin,  macrocrystal-monohydrate, (MACROBID) 100 MG capsule Take 100 mg by mouth at bedtime.  3  . omeprazole (PRILOSEC) 20 MG capsule Take 20 mg by mouth daily as needed (acid reflux).     . ranitidine (ZANTAC) 300 MG tablet Take 1 tablet (300 mg total) by mouth at bedtime. 90 tablet 3  . traMADol (ULTRAM) 50 MG tablet Take 50 mg by mouth every 12 (twelve) hours as needed for moderate pain.     No current facility-administered medications for this visit.     Allergies  Allergen Reactions  . Codeine Other (See Comments)    Passed out    Social History   Social History  . Marital status: Widowed    Spouse name: N/A  . Number of children: 4  . Years of education: 12   Occupational History  . Retired-district Network engineer    Social History Main Topics  . Smoking status: Never Smoker  . Smokeless tobacco: Never Used  . Alcohol use No  . Drug use: No  . Sexual activity: No   Other Topics Concern  . Not on file   Social History Narrative   Lives alone in a one story home.  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     Family History  Problem Relation Age of Onset  . Stroke Father   . Cancer - Other Sister     Breast  . Cancer - Other Brother     Throat  . Cancer - Other Sister     Throat  . Cancer - Other      Brain    Review of Systems:  As stated in the HPI and otherwise negative.   BP (!) 130/50   Pulse 72   Ht 5\' 4"  (1.626 m)   Wt 88 lb 6.4 oz (40.1 kg)   SpO2 98%   BMI 15.17 kg/m   Physical Examination: General: Well developed, well nourished, NAD  HEENT: OP clear, mucus membranes moist  SKIN: warm, dry. No rashes. Neuro: No focal deficits  Musculoskeletal: Muscle strength 5/5 all ext  Psychiatric: Mood and affect normal  Neck: No JVD, no carotid bruits, no thyromegaly, no lymphadenopathy.  Lungs:Clear bilaterally, no wheezes, rhonci, crackles Cardiovascular: Regular rate and rhythm. No murmurs, gallops or rubs. Abdomen:Soft. Bowel sounds present. Non-tender.    Extremities: No lower extremity edema. Pulses are 2 + in the bilateral DP/PT.  Echo March 2016: Left ventricle: The cavity size was normal. There was mild focal basal hypertrophy of the septum. Systolic function was normal. The estimated ejection fraction was in the range of 55% to 60%. Wall motion was normal; there were no regional wall motion abnormalities. Features are consistent with a pseudonormal left ventricular filling pattern, with concomitant  abnormal relaxation and increased filling pressure (grade 2 diastolic dysfunction). - Aortic valve: There was trivial regurgitation. - Mitral valve: There was mild regurgitation. - Pericardium, extracardiac: A small pericardial effusion was identified.  Impressions:  - Normal LV function; grade 2 diastolic dysfunction; mild MR and TR; trace AI; small pericardial effusion.  EKG:  EKG is not ordered today. The ekg ordered today demonstrates   Recent Labs: 04/09/2015: ALT 11; BUN 16; Creatinine, Ser 0.76; Potassium 5.2; Sodium 140; TSH 2.80 05/08/2015: Hemoglobin 13.0; Platelets 307   Lipid Panel    Component Value Date/Time   CHOL 173 04/09/2015 1523   TRIG 124.0 04/09/2015 1523   HDL 62.30 04/09/2015 1523   CHOLHDL 3 04/09/2015 1523   VLDL 24.8 04/09/2015 1523   LDLCALC 86 04/09/2015 1523     Wt Readings from Last 3 Encounters:  08/27/15 88 lb 6.4 oz (40.1 kg)  05/13/15 84 lb 12.8 oz (38.5 kg)  05/08/15 84 lb 8 oz (38.3 kg)     Other studies Reviewed: Additional studies/ records that were reviewed today include: . Review of the above records demonstrates:    Assessment and Plan:   1, SVT: She has documented SVT on cardiac monitor in 2015 with possible second degree AV block. She now has a loop recorder which has not shown significant heart block or prolonged SVT. This is now being followed by Dr. Lovena Le in [place] clinic.    2. Chest pain: Arrange Lexiscan stress myoview to exclude ischemia.   3. Mitral  regurgitation: Mild by echo March 2016.   Current medicines are reviewed at length with the patient today.  The patient does not have concerns regarding medicines.  The following changes have been made:  no change  Labs/ tests ordered today include:   No orders of the defined types were placed in this encounter.   Disposition:   FU with me in 12 months  Signed, Lauree Chandler, MD 08/27/2015 5:04 PM    [facility] [address], [place], [place]  [address] Phone: [phone]; Fax: [phone]

## 2015-09-03 ENCOUNTER — Ambulatory Visit (INDEPENDENT_AMBULATORY_CARE_PROVIDER_SITE_OTHER): Payer: Medicare Other | Admitting: Pulmonary Disease

## 2015-09-03 ENCOUNTER — Encounter: Payer: Self-pay | Admitting: Pulmonary Disease

## 2015-09-03 ENCOUNTER — Other Ambulatory Visit (INDEPENDENT_AMBULATORY_CARE_PROVIDER_SITE_OTHER): Payer: Medicare Other

## 2015-09-03 ENCOUNTER — Other Ambulatory Visit: Payer: Self-pay

## 2015-09-03 VITALS — BP 122/64 | HR 75 | Ht 64.0 in | Wt 88.6 lb

## 2015-09-03 DIAGNOSIS — J189 Pneumonia, unspecified organism: Secondary | ICD-10-CM

## 2015-09-03 LAB — CBC WITH DIFFERENTIAL/PLATELET
Basophils Absolute: 0 10*3/uL (ref 0.0–0.1)
Basophils Relative: 0.5 % (ref 0.0–3.0)
Eosinophils Absolute: 0.1 10*3/uL (ref 0.0–0.7)
Eosinophils Relative: 1.4 % (ref 0.0–5.0)
HCT: 40.2 % (ref 36.0–46.0)
Hemoglobin: 13.6 g/dL (ref 12.0–15.0)
Lymphocytes Relative: 16.9 % (ref 12.0–46.0)
Lymphs Abs: 1.4 10*3/uL (ref 0.7–4.0)
MCHC: 34 g/dL (ref 30.0–36.0)
MCV: 92.4 fl (ref 78.0–100.0)
Monocytes Absolute: 0.8 10*3/uL (ref 0.1–1.0)
Monocytes Relative: 10.2 % (ref 3.0–12.0)
Neutro Abs: 5.7 10*3/uL (ref 1.4–7.7)
Neutrophils Relative %: 71 % (ref 43.0–77.0)
Platelets: 262 10*3/uL (ref 150.0–400.0)
RBC: 4.34 Mil/uL (ref 3.87–5.11)
RDW: 13.9 % (ref 11.5–15.5)
WBC: 8 10*3/uL (ref 4.0–10.5)

## 2015-09-03 LAB — COMPREHENSIVE METABOLIC PANEL
ALT: 14 U/L (ref 0–35)
AST: 21 U/L (ref 0–37)
Albumin: 4.1 g/dL (ref 3.5–5.2)
Alkaline Phosphatase: 104 U/L (ref 39–117)
BUN: 20 mg/dL (ref 6–23)
CO2: 32 mEq/L (ref 19–32)
Calcium: 9.4 mg/dL (ref 8.4–10.5)
Chloride: 102 mEq/L (ref 96–112)
Creatinine, Ser: 0.78 mg/dL (ref 0.40–1.20)
GFR: 74.39 mL/min (ref >60.00)
Glucose, Bld: 93 mg/dL (ref 70–99)
Potassium: 4.3 mEq/L (ref 3.5–5.1)
Sodium: 139 mEq/L (ref 135–145)
Total Bilirubin: 0.5 mg/dL (ref 0.2–1.2)
Total Protein: 8.1 g/dL (ref 6.0–8.3)

## 2015-09-03 LAB — C-REACTIVE PROTEIN: CRP: 3.4 mg/dL (ref 0.5–20.0)

## 2015-09-03 LAB — SEDIMENTATION RATE: Sed Rate: 38 mm/hr — ABNORMAL HIGH (ref 0–30)

## 2015-09-03 LAB — RHEUMATOID FACTOR: Rhuematoid fact SerPl-aCnc: <10 IU/mL (ref <=14)

## 2015-09-03 NOTE — Patient Instructions (Addendum)
We will give her a flutter valve for clearance of seceretions and send sputum for AFB, fungal and regular cultures Check labs including CBC with diff, CMP, ANA, ANCA, CCP, Rheumatoid factor, Sed rate CRP. Return in 1 month. If we are unable to get a specimen then I will plan on bronchoscope.

## 2015-09-03 NOTE — Progress Notes (Signed)
Julie Silva    [id number]    Julie Silva  Primary Care Physician:Thomas Ronnald Ramp, MD  Referring Physician: Janith Lima, MD [address] [place], [place] [address]  Chief complaint:  Consult for evaluation of abnormal CT scan.  HPI: Julie Silva is a 82 year old with past medical history as below. She was treated for a pneumonia in March. She had an abnormal CXR at that time. Follow-up CT of the chest shows tree-in-bud opacities, consolidative changes. She got a repeat CT scan 3 months later which showed persistence of these changes.  She has chronic cough but is unable to bring up any sputum. She denies any fevers, chills, hemoptysis. She's lost some weight recently and is trying to regain it. She has history of bladder surgery and chronic UTIs. She's been on Macrobid for many years. She denies any signs and symptoms of connective tissue disease, autoimmune disease.   Outpatient Encounter Prescriptions as of 09/03/2015  Medication Sig  . aspirin EC 81 MG tablet Take 81 mg by mouth daily.  . bimatoprost (LUMIGAN) 0.01 % SOLN Place 1 drop into both eyes at bedtime.  . dorzolamide-timolol (COSOPT) 22.3-6.8 MG/ML ophthalmic solution Place 1 drop into both eyes 2 (two) times daily.   Marland Kitchen HYDROcodone-acetaminophen (NORCO/VICODIN) 5-325 MG tablet Take 1-2 tablets by mouth every 4 (four) hours as needed for moderate pain.  . mirtazapine (REMERON) 15 MG tablet Take 1 tablet (15 mg total) by mouth at bedtime.  . Multiple Vitamins-Minerals (PRESERVISION AREDS) CAPS Take 1 capsule by mouth 2 (two) times daily.  . nitrofurantoin, macrocrystal-monohydrate, (MACROBID) 100 MG capsule Take 100 mg by mouth at bedtime.  Marland Kitchen omeprazole (PRILOSEC) 20 MG capsule Take 20 mg by mouth daily as needed (acid reflux).   . ranitidine (ZANTAC) 300 MG tablet Take 1 tablet (300 mg total) by mouth at bedtime.  . traMADol (ULTRAM) 50 MG tablet Take 50 mg by mouth every 12 (twelve) hours as needed for  moderate pain.  . Calcium Carbonate (CALTRATE 600 PO) Take 2 tablets by mouth daily.   No facility-administered encounter medications on file as of 09/03/2015.     Allergies as of 09/03/2015 - Review Complete 09/03/2015  Allergen Reaction Noted  . Codeine Other (See Comments) 02/02/2013    Past Medical History:  Diagnosis Date  . Anemia   . Arthritis    Osteoporosis  . Bleeds easily [facility])    "father was a hemophiliac" - pt not being bothered in recent years.  . Calcium deposit in bursa of knee 2017  . Cancer ([facility])    skin cancer "basal cell".  . Chicken pox   . Cyst, Baker's knee    left knee- tx. Cortisone injection 05-05-15 in office- "improved pain relief and swelling left ankle and knee"  . Diverticulitis   . Dysrhythmia    Dr. McAlhany-cardiology  . Glaucoma of both eyes   . Hearing loss of both ears    Bilateral ears- hearing aids used- right ear hearing betther than left.  . History of blood transfusion 1957   "lots; most related to my periods"-last 1968 -s/p Hysterectomy  . Migraines    "years ago" (02/04/2013)  . PONV (postoperative nausea and vomiting)   . Self-catheterizes urinary bladder    "daily- retained urine and chronic UTI"  . SVT (supraventricular tachycardia) (HCC)    Dr. Darnell Level. Taylor-follows "loop recorder left chest"  . Swallowing difficulty    freq occ. mostley liquids  .  Urine incontinence   . UTI (urinary tract infection)    Chronic Urinary tract infections- tx Macrobid daily.  . Vertigo     Past Surgical History:  Procedure Laterality Date  . APPENDECTOMY  1948  . BLADDER SURGERY  1995   Repair-   . BREAST BIOPSY Bilateral    "both were fine"  . CATARACT EXTRACTION W/ INTRAOCULAR LENS IMPLANT Left   . CHOLECYSTECTOMY  2014  . COMBINED HYSTERECTOMY ABDOMINAL W/ A&P REPAIR / OOPHORECTOMY    . DILATION AND CURETTAGE OF UTERUS    . EP IMPLANTABLE DEVICE N/A 06/19/2014   Procedure: Loop Recorder Insertion;  Surgeon: Evans Lance, MD;  Location:  [facility];  Service: Cardiovascular;  Laterality: N/A;  . EXCISION OF BREAST BIOPSY Right 05/13/2015   Procedure: RIGHT BREAST EXCISIONAL BIOPSY x2;  Surgeon: Armandina Gemma, MD;  Location: [facility];  Service: General;  Laterality: Right;  . TONSILLECTOMY  1946  . TOTAL ABDOMINAL HYSTERECTOMY    . VAGINAL HYSTERECTOMY  1968    Family History  Problem Relation Age of Onset  . Stroke Father   . Cancer - Other Sister     Breast  . Cancer - Other Brother     Throat  . Cancer - Other Sister     Throat  . Cancer - Other      Brain    Social History   Social History  . Marital status: Widowed    Spouse name: N/A  . Number of children: 4  . Years of education: 12   Occupational History  . Retired-district Network engineer    Social History Main Topics  . Smoking status: Never Smoker  . Smokeless tobacco: Never Used  . Alcohol use No  . Drug use: No  . Sexual activity: No   Other Topics Concern  . Not on file   Social History Narrative   Lives alone in a one story home.  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      Review of systems: Review of Systems  Constitutional: Negative for fever and chills.  HENT: Negative.   Eyes: Negative for blurred vision.  Respiratory: as per HPI  Cardiovascular: Negative for chest pain and palpitations.  Gastrointestinal: Negative for vomiting, diarrhea, blood per rectum. Genitourinary: Negative for dysuria, urgency, frequency and hematuria.  Musculoskeletal: Negative for myalgias, back pain and joint pain.  Skin: Negative for itching and rash.  Neurological: Negative for dizziness, tremors, focal weakness, seizures and loss of consciousness.  Endo/Heme/Allergies: Negative for environmental allergies.  Psychiatric/Behavioral: Negative for depression, suicidal ideas and hallucinations.  All other systems reviewed and are negative.   Physical Exam: Blood pressure 122/64, pulse 75, height 5\' 4"  (1.626 m), weight 88 lb 9.6 oz (40.2 kg), SpO2  98 %. Gen:      No acute distress HEENT:  EOMI, sclera anicteric Neck:     No masses; no thyromegaly Lungs:    Clear to auscultation bilaterally; normal respiratory effort CV:         Regular rate and rhythm; no murmurs Abd:      + bowel sounds; soft, non-tender; no palpable masses, no distension Ext:    No edema; adequate peripheral perfusion Skin:      Warm and dry; no rash Neuro: alert and oriented x 3 Psych: normal mood and affect  Data Reviewed: CT scan  05/01/15- diffuse emphysematous changes, bilateral consolidative nodular opacities in the lower lobes, right middle lobe bronchiectasis, tree-in-bud opacity.  CT scan  07/31/15-  similar to above. Some consolidative changes are worse when others show improvement. Images personally reviewed  Assessment:  Assessment for abnormal CT scan.  She has predominantly right middle lobe bronchiectasis, tree-in-bud opacities and consolidative changes in the bases. This is suggestive of MAI infection. I do refer to a lot of help her bring up secretions and send the sputum for culture. If we're unable to obtain a sputum specimen that we may be forced to do a bronchoscope. However I would like to avoid this as she is frail and elderly.  Nitrofurantoin pulmonary toxicity is a possibility. I discussed taking her off it but her daughter is concerned that she may get a UTI. We will reevaluate this after the initial tests for infection. I'll also send basic serologies to evaluate for connective tissue disease although the clinical suspicion for this is low.  Plan/Recommendations: -  Flutter valve, sputum for cultures, AFB -  Serologies for CTD  Follow up in 1 month.  Marshell Garfinkel MD [facility] Pager [phone] If no answer or after 3pm call: [phone] 09/03/2015, 3:28 PM   Marshell Garfinkel MD  Pulmonary and Critical Care Pager [phone] 09/03/2015, 2:57 PM  CC: Janith Lima, MD

## 2015-09-04 LAB — ANA COMPREHENSIVE PANEL
Anti JO-1: <0.2 AI (ref 0.0–0.9)
Centromere Ab Screen: <0.2 AI (ref 0.0–0.9)
Chromatin Ab SerPl-aCnc: <0.2 AI (ref 0.0–0.9)
ENA RNP Ab: <0.2 AI (ref 0.0–0.9)
ENA SM Ab Ser-aCnc: <0.2 AI (ref 0.0–0.9)
ENA SSA (RO) Ab: <0.2 AI (ref 0.0–0.9)
ENA SSB (LA) Ab: <0.2 AI (ref 0.0–0.9)
Scleroderma SCL-70: <0.2 AI (ref 0.0–0.9)
dsDNA Ab: <1 IU/mL (ref 0–9)

## 2015-09-04 LAB — CYCLIC CITRUL PEPTIDE ANTIBODY, IGG: Cyclic Citrullin Peptide Ab: <16 Units

## 2015-09-04 LAB — ANCA SCREEN W REFLEX TITER: ANCA Screen: NEGATIVE

## 2015-09-04 MED ORDER — FLUTTER DEVI: 0 refills | Status: DC

## 2015-10-06 ENCOUNTER — Encounter: Payer: Self-pay | Admitting: Pulmonary Disease

## 2015-10-06 ENCOUNTER — Ambulatory Visit (INDEPENDENT_AMBULATORY_CARE_PROVIDER_SITE_OTHER): Payer: Medicare Other | Admitting: General Practice

## 2015-10-06 ENCOUNTER — Ambulatory Visit (INDEPENDENT_AMBULATORY_CARE_PROVIDER_SITE_OTHER): Payer: Medicare Other | Admitting: Pulmonary Disease

## 2015-10-06 VITALS — BP 124/64 | HR 97 | Ht 64.0 in | Wt 91.2 lb

## 2015-10-06 DIAGNOSIS — R938 Abnormal findings on diagnostic imaging of other specified body structures: Secondary | ICD-10-CM | POA: Diagnosis not present

## 2015-10-06 DIAGNOSIS — Z23 Encounter for immunization: Secondary | ICD-10-CM

## 2015-10-06 DIAGNOSIS — R9389 Abnormal findings on diagnostic imaging of other specified body structures: Secondary | ICD-10-CM | POA: Insufficient documentation

## 2015-10-06 MED ORDER — SULFAMETHOXAZOLE-TRIMETHOPRIM 200-40 MG/5ML PO SUSP: 5.0000 mL | ORAL | 0 refills | Status: DC

## 2015-10-06 NOTE — Patient Instructions (Signed)
We will start you on bactrim 40 mg/200 mg thrice weekly. Please use this in place of nitrofurantoin.  Follow up appointment in 6 months.

## 2015-10-06 NOTE — Progress Notes (Signed)
Julie Silva    [id number]    1927/10/02  Primary Care Physician:Thomas Ronnald Ramp, MD  Referring Physician: Janith Lima, MD [address], [place] [address]  Chief complaint: Follow up for abnormal CT scan.  HPI: Julie Silva is a 82 year old with past medical history as below. She was treated for a pneumonia in March. She had an abnormal CXR at that time. Follow-up CT of the chest shows tree-in-bud opacities, consolidative changes. She got a repeat CT scan 3 months later which showed persistence of these changes. She denies any symptoms of fever, sputum production, dyspnea, wheezing, fevers, chills.  She has chronic cough but is unable to bring up any sputum. She denies any fevers, chills, hemoptysis. She's lost some weight recently and is trying to regain it. She has history of bladder surgery and chronic UTIs. She's been on Macrobid for many years. She denies any signs and symptoms of connective tissue disease, autoimmune disease.   Outpatient Encounter Prescriptions as of 10/06/2015  Medication Sig  . aspirin EC 81 MG tablet Take 81 mg by mouth daily.  . bimatoprost (LUMIGAN) 0.01 % SOLN Place 1 drop into both eyes at bedtime.  . Calcium Carbonate (CALTRATE 600 PO) Take 2 tablets by mouth daily.  . dorzolamide-timolol (COSOPT) 22.3-6.8 MG/ML ophthalmic solution Place 1 drop into both eyes 2 (two) times daily.   Marland Kitchen HYDROcodone-acetaminophen (NORCO/VICODIN) 5-325 MG tablet Take 1-2 tablets by mouth every 4 (four) hours as needed for moderate pain.  . mirtazapine (REMERON) 15 MG tablet Take 1 tablet (15 mg total) by mouth at bedtime.  . Multiple Vitamins-Minerals (PRESERVISION AREDS) CAPS Take 1 capsule by mouth 2 (two) times daily.  . nitrofurantoin, macrocrystal-monohydrate, (MACROBID) 100 MG capsule Take 100 mg by mouth at bedtime.  . ranitidine (ZANTAC) 300 MG tablet Take 1 tablet (300 mg total) by mouth at bedtime.  Marland Kitchen Respiratory Therapy Supplies  (FLUTTER) DEVI Use as directed.  . traMADol (ULTRAM) 50 MG tablet Take 50 mg by mouth every 12 (twelve) hours as needed for moderate pain.  . [DISCONTINUED] omeprazole (PRILOSEC) 20 MG capsule Take 20 mg by mouth daily as needed (acid reflux).    No facility-administered encounter medications on file as of 10/06/2015.     Allergies as of 10/06/2015 - Review Complete 10/06/2015  Allergen Reaction Noted  . Codeine Other (See Comments) 02/02/2013    Past Medical History:  Diagnosis Date  . Anemia   . Arthritis    Osteoporosis  . Bleeds easily [facility])    "father was a hemophiliac" - pt not being bothered in recent years.  . Calcium deposit in bursa of knee 2017  . Cancer ([facility])    skin cancer "basal cell".  . Chicken pox   . Cyst, Baker's knee    left knee- tx. Cortisone injection 05-05-15 in office- "improved pain relief and swelling left ankle and knee"  . Diverticulitis   . Dysrhythmia    Dr. McAlhany-cardiology  . Glaucoma of both eyes   . Hearing loss of both ears    Bilateral ears- hearing aids used- right ear hearing betther than left.  . History of blood transfusion 1957   "lots; most related to my periods"-last 1968 -s/p Hysterectomy  . Migraines    "years ago" (02/04/2013)  . PONV (postoperative nausea and vomiting)   . Self-catheterizes urinary bladder    "daily- retained urine and chronic UTI"  . SVT (supraventricular tachycardia) ([facility])  Dr. Darnell Level. [place]-follows "loop recorder left chest"  . Swallowing difficulty    freq occ. mostley liquids  . Urine incontinence   . UTI (urinary tract infection)    Chronic Urinary tract infections- tx Macrobid daily.  . Vertigo     Past Surgical History:  Procedure Laterality Date  . APPENDECTOMY  1948  . BLADDER SURGERY  1995   Repair-   . BREAST BIOPSY Bilateral    "both were fine"  . CATARACT EXTRACTION W/ INTRAOCULAR LENS IMPLANT Left   . CHOLECYSTECTOMY  2014  . COMBINED HYSTERECTOMY ABDOMINAL W/ A&P REPAIR / OOPHORECTOMY     . DILATION AND CURETTAGE OF UTERUS    . EP IMPLANTABLE DEVICE N/A 06/19/2014   Procedure: Loop Recorder Insertion;  Surgeon: Evans Lance, MD;  Location: [facility];  Service: Cardiovascular;  Laterality: N/A;  . EXCISION OF BREAST BIOPSY Right 05/13/2015   Procedure: RIGHT BREAST EXCISIONAL BIOPSY x2;  Surgeon: Armandina Gemma, MD;  [facility];  Service: General;  Laterality: Right;  . TONSILLECTOMY  1946  . TOTAL ABDOMINAL HYSTERECTOMY    . VAGINAL HYSTERECTOMY  1968    Family History  Problem Relation Age of Onset  . Stroke Father   . Cancer - Other Sister     Breast  . Cancer - Other Brother     Throat  . Cancer - Other Sister     Throat  . Cancer - Other      Brain    Social History   Social History  . Marital status: Widowed    Spouse name: N/A  . Number of children: 4  . Years of education: 12   Occupational History  . Retired-district Network engineer    Social History Main Topics  . Smoking status: Never Smoker  . Smokeless tobacco: Never Used  . Alcohol use No  . Drug use: No  . Sexual activity: No   Other Topics Concern  . Not on file   Social History Narrative   Lives alone in a one story home.  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    Review of systems: Review of Systems  Constitutional: Negative for fever and chills.  HENT: Negative.   Eyes: Negative for blurred vision.  Respiratory: as per HPI  Cardiovascular: Negative for chest pain and palpitations.  Gastrointestinal: Negative for vomiting, diarrhea, blood per rectum. Genitourinary: Negative for dysuria, urgency, frequency and hematuria.  Musculoskeletal: Negative for myalgias, back pain and joint pain.  Skin: Negative for itching and rash.  Neurological: Negative for dizziness, tremors, focal weakness, seizures and loss of consciousness.  Endo/Heme/Allergies: Negative for environmental allergies.  Psychiatric/Behavioral: Negative for depression, suicidal ideas and hallucinations.    All other systems reviewed and are negative.   Physical Exam: Blood pressure 122/64, pulse 75, height 5\' 4"  (1.626 m), weight 88 lb 9.6 oz (40.2 kg), SpO2 98 %. Gen:      No acute distress HEENT:  EOMI, sclera anicteric Neck:     No masses; no thyromegaly Lungs:    Clear to auscultation bilaterally; normal respiratory effort CV:         Regular rate and rhythm; no murmurs Abd:      + bowel sounds; soft, non-tender; no palpable masses, no distension Ext:    No edema; adequate peripheral perfusion Skin:      Warm and dry; no rash Neuro: alert and oriented x 3 Psych: normal mood and affect  Data Reviewed: CT scan  05/01/15- diffuse  emphysematous changes, bilateral consolidative nodular opacities in the lower lobes, right middle lobe bronchiectasis, tree-in-bud opacity. Images reviewed CT scan 07/31/15-  similar to above. Some consolidative changes are worse when others show improvement. Images personally reviewed. Images reviewed  Assessment:  Follow up for abnormal CT scan.  She has predominantly right middle lobe bronchiectasis, tree-in-bud opacities and consolidative changes in the bases. This is suggestive of MAI infection. She is using a flutter valve but she is unable to bring up secretions. We have not been able to  send the sputum for culture. As she is asymptomatic, frail I would hold off on a bronchoscope. Serologies for CTD are negative   Nitrofurantoin pulmonary toxicity is a possibility. She needs to be on some kind of prophylaxis as she has had bladder surgery and has to straight cath herself. She tried to take her self off the nitorfurantoin last month but developed UTI like symptoms. I will switch to bactrim 3 times/wekk. Reassess with CT scan in 6 months. Consider bronch if abnormalities persist.  Plan/Recommendations: - Continue flutter valve - Replace nitrofurantoin with bactrim 3 times/week.  Follow up in 6 month.  Marshell Garfinkel MD [facility] Pager [phone] If no answer or after 3pm call: [phone] 10/06/2015, 1:49 PM  CC: Janith Lima, MD

## 2015-10-08 ENCOUNTER — Telehealth: Payer: Self-pay | Admitting: Internal Medicine

## 2015-10-08 NOTE — Telephone Encounter (Signed)
Spoke with pts daughter informed her that pt would need to come for further evaluation of episode on Linq. Pts daughter agreeable to apt with Dr. Lovena Le 10/09/2015 at 10:30am.

## 2015-10-08 NOTE — Telephone Encounter (Signed)
New message   Pt returning device call.

## 2015-10-08 NOTE — Telephone Encounter (Signed)
Informed pt daughter that we did receive remote transmission. Call forwarded to device tech RN b/c pt is symptomatic.

## 2015-10-09 ENCOUNTER — Encounter: Payer: Self-pay | Admitting: Internal Medicine

## 2015-10-09 ENCOUNTER — Telehealth: Payer: Self-pay

## 2015-10-09 ENCOUNTER — Ambulatory Visit (INDEPENDENT_AMBULATORY_CARE_PROVIDER_SITE_OTHER): Payer: Medicare Other | Admitting: Internal Medicine

## 2015-10-09 ENCOUNTER — Telehealth: Payer: Self-pay | Admitting: Internal Medicine

## 2015-10-09 VITALS — BP 130/50 | HR 80 | Ht 64.0 in | Wt 91.2 lb

## 2015-10-09 DIAGNOSIS — I471 Supraventricular tachycardia: Secondary | ICD-10-CM

## 2015-10-09 MED ORDER — APIXABAN 2.5 MG PO TABS: 2.5000 mg | ORAL_TABLET | Freq: Two times a day (BID) | ORAL | 4 refills | Status: DC

## 2015-10-09 NOTE — Progress Notes (Signed)
HPI [name] returns today for followup. She is a pleasant 82 yo woman with palpitations and tachycardia on cardiac monitoring who had an ILR placed several months ago. In the interim, she has been stable until a few weeks ago when she began to experience palpitations which have become more frequent and interogation of her ILR demonstrates episodes of atrial fibrillation. She has noise as well on her ILR.  Allergies  Allergen Reactions  . Codeine Other (See Comments)    Passed out     Current Outpatient Prescriptions  Medication Sig Dispense Refill  . bimatoprost (LUMIGAN) 0.01 % SOLN Place 1 drop into both eyes at bedtime.    . Calcium Carbonate (CALTRATE 600 PO) Take 2 tablets by mouth daily.    . dorzolamide-timolol (COSOPT) 22.3-6.8 MG/ML ophthalmic solution Place 1 drop into both eyes 2 (two) times daily.     . mirtazapine (REMERON) 15 MG tablet Take 1 tablet (15 mg total) by mouth at bedtime. 30 tablet 11  . Multiple Vitamins-Minerals (PRESERVISION AREDS) CAPS Take 1 capsule by mouth 2 (two) times daily.    . ranitidine (ZANTAC) 300 MG tablet Take 1 tablet (300 mg total) by mouth at bedtime. 90 tablet 3  . Respiratory Therapy Supplies (FLUTTER) DEVI Use as directed. 1 each 0  . traMADol (ULTRAM) 50 MG tablet Take 50 mg by mouth every 12 (twelve) hours as needed for moderate pain.    Marland Kitchen apixaban (ELIQUIS) 2.5 MG TABS tablet Take 1 tablet (2.5 mg total) by mouth 2 (two) times daily. 60 tablet 4  . HYDROcodone-acetaminophen (NORCO/VICODIN) 5-325 MG tablet Take 1-2 tablets by mouth every 4 (four) hours as needed for moderate pain. (Patient not taking: Reported on 10/09/2015) 20 tablet 0  . nitrofurantoin, macrocrystal-monohydrate, (MACROBID) 100 MG capsule Take 100 mg by mouth at bedtime.  3  . sulfamethoxazole-trimethoprim (BACTRIM,SEPTRA) 200-40 MG/5ML suspension Take 5 mLs by mouth 3 (three) times a week. (Patient not taking: Reported on 10/09/2015) 100 mL 0   No current  facility-administered medications for this visit.      Past Medical History:  Diagnosis Date  . Anemia   . Arthritis    Osteoporosis  . Bleeds easily [facility])    "father was a hemophiliac" - pt not being bothered in recent years.  . Calcium deposit in bursa of knee 2017  . Cancer ([place])    skin cancer "basal cell".  . Chicken pox   . Cyst, Baker's knee    left knee- tx. Cortisone injection 05-05-15 in office- "improved pain relief and swelling left ankle and knee"  . Diverticulitis   . Dysrhythmia    Dr. McAlhany-cardiology  . Glaucoma of both eyes   . Hearing loss of both ears    Bilateral ears- hearing aids used- right ear hearing betther than left.  . History of blood transfusion 1957   "lots; most related to my periods"-last 1968 -s/p Hysterectomy  . Migraines    "years ago" (02/04/2013)  . PONV (postoperative nausea and vomiting)   . Self-catheterizes urinary bladder    "daily- retained urine and chronic UTI"  . SVT (supraventricular tachycardia) (HCC)    Dr. Darnell Level. Natally Ribera-follows "loop recorder left chest"  . Swallowing difficulty    freq occ. mostley liquids  . Urine incontinence   . UTI (urinary tract infection)    Chronic Urinary tract infections- tx Macrobid daily.  . Vertigo     ROS:   All systems reviewed and negative except  as noted in the HPI.   Past Surgical History:  Procedure Laterality Date  . APPENDECTOMY  1948  . BLADDER SURGERY  1995   Repair-   . BREAST BIOPSY Bilateral    "both were fine"  . CATARACT EXTRACTION W/ INTRAOCULAR LENS IMPLANT Left   . CHOLECYSTECTOMY  2014  . COMBINED HYSTERECTOMY ABDOMINAL W/ A&P REPAIR / OOPHORECTOMY    . DILATION AND CURETTAGE OF UTERUS    . EP IMPLANTABLE DEVICE N/A 06/19/2014   Procedure: Loop Recorder Insertion;  Surgeon: Evans Lance, MD;  Location: [facility];  Service: Cardiovascular;  Laterality: N/A;  . EXCISION OF BREAST BIOPSY Right 05/13/2015   Procedure: RIGHT BREAST EXCISIONAL BIOPSY x2;   Surgeon: Armandina Gemma, MD;  [facility];  Service: General;  Laterality: Right;  . TONSILLECTOMY  1946  . TOTAL ABDOMINAL HYSTERECTOMY    . VAGINAL HYSTERECTOMY  1968     Family History  Problem Relation Age of Onset  . Stroke Father   . Cancer - Other Sister     Breast  . Cancer - Other Brother     Throat  . Cancer - Other Sister     Throat  . Cancer - Other      Brain     Social History   Social History  . Marital status: Widowed    Spouse name: N/A  . Number of children: 4  . Years of education: 12   Occupational History  . Retired-district Network engineer    Social History Main Topics  . Smoking status: Never Smoker  . Smokeless tobacco: Never Used  . Alcohol use No  . Drug use: No  . Sexual activity: No   Other Topics Concern  . Not on file   Social History Narrative   Lives alone in a one story home.  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      BP (!) 130/50   Pulse 80   Ht 5\' 4"  (1.626 m)   Wt 91 lb 3.2 oz (41.4 kg)   BMI 15.65 kg/m   Physical Exam:  Well appearing elderly woman, NAD HEENT: Unremarkable Neck:  No JVD, no thyromegally Lymphatics:  No adenopathy Back:  No CVA tenderness Lungs:  Clear with no wheezes HEART:  Regular rate rhythm, no murmurs, no rubs, no clicks Abd:  soft, positive bowel sounds, no organomegally, no rebound, no guarding Ext:  2 plus pulses, no edema, no cyanosis, no clubbing Skin:  No rashes no nodules Neuro:  CN II through XII intact, motor grossly intact  DEVICE  Normal device function.  See PaceArt for details. She has noise but is also having episodes of atrial fib with an IRIR tachycardia  Assess/Plan: 1. Palpitations - these appear to be due to atrial fib 2. ILR - her device demonstrates some noise but also atrial fib. 3. Weight loss - we discussed the importance of taking in more calories.  She states that she has gained about 10 lbs. 4. PAF - this is a new diagnosis. I have recommended starting Eliquis  2.5 mg twice daily. We also discussed anti-arrhythmic drug therapy. She would like to hold off on Flecainide or now.  Julie Silva.D.

## 2015-10-09 NOTE — Telephone Encounter (Signed)
Prior auth for Eliquis 2.5mg submitted to [facility]. 

## 2015-10-09 NOTE — Telephone Encounter (Signed)
New Message  Pt c/o medication issue:  1. Name of Medication: Eliquis  2. How are you currently taking this medication (dosage and times per day)?  2.5 mg tablets twice daily  3. Are you having a reaction (difficulty breathing--STAT)? No  4. What is your medication issue? Pts daughter voiced pharmacy ([facility], [address], [place]) stated insurance will not cover/approve Eliquis without MD's response.  Pts daughter voiced for MD to contact the insurance company.  Please f/u

## 2015-10-09 NOTE — Patient Instructions (Addendum)
Medication Instructions:  Your physician has recommended you make the following change in your medication:  1) START Eliquis 2.5 mg twice a day 2) STOP Aspirin   Labwork: None Ordered   Testing/Procedures: None Ordered   Follow-Up: Your physician recommends that you schedule a follow-up appointment in: 3 months with Dr. Lovena Le.    Any Other Special Instructions Will Be Listed Below (If Applicable).     If you need a refill on your cardiac medications before your next appointment, please call your pharmacy.

## 2015-10-12 ENCOUNTER — Telehealth: Payer: Self-pay

## 2015-10-12 ENCOUNTER — Ambulatory Visit (INDEPENDENT_AMBULATORY_CARE_PROVIDER_SITE_OTHER): Payer: Medicare Other | Admitting: *Deleted

## 2015-10-12 DIAGNOSIS — R002 Palpitations: Secondary | ICD-10-CM | POA: Diagnosis not present

## 2015-10-12 NOTE — Telephone Encounter (Signed)
Eliquis has been denied by [facility]  [facility]. Xarelto is preferred. Do you want to change it or should I appeal?

## 2015-10-12 NOTE — Telephone Encounter (Signed)
Prior auth forms for Eliquis 2.5mg  sybmitted to [facility]. Also sent electronically on Friday 10/09/2015.

## 2015-10-13 NOTE — Progress Notes (Signed)
Carelink Summary Report / Loop Recorder 

## 2015-10-14 ENCOUNTER — Telehealth: Payer: Self-pay | Admitting: Internal Medicine

## 2015-10-14 NOTE — Telephone Encounter (Signed)
°*  STAT* If patient is at the pharmacy, call can be transferred to refill team.   1. Which medications need to be refilled? (please list name of each medication and dose if known) new prescription-he just put her on this last week-new prescription for Flecanide  2. Which pharmacy/location (including street and city if local pharmacy) is medication to be sent to?[facility]-[phone]  3. Do they need a 30 day or 90 day supply? 30 days and refills

## 2015-10-14 NOTE — Telephone Encounter (Signed)
Julie Silva  10/09/2015 10:30 AM  Office Visit  MRN:  [id number]  Description: Female DOB: 1927/10/21 Provider: Evans Lance, MD Department: [facility]  Vitals   BP    130/50   Pulse  80   Ht  5\' 4"  (1.626 m)   Wt  91 lb 3.2 oz (41.4 kg)   BMI  15.65 kg/m      Vitals History  Progress Notes   Evans Lance, MD at 10/09/2015 10:30 AM   Status: Signed   Assess/Plan: 1. Palpitations - these appear to be due to atrial fib 2. ILR - her device demonstrates some noise but also atrial fib. 3. Weight loss - we discussed the importance of taking in more calories.  She states that she has gained about 10 lbs. 4. PAF - this is a new diagnosis. I have recommended starting Eliquis 2.5 mg twice daily. We also discussed anti-arrhythmic drug therapy. She would like to hold off on Flecainide or now.  Mikle Bosworth.D.

## 2015-10-14 NOTE — Telephone Encounter (Signed)
Follow Up: ° ° ° °Returning your call from earlier this week. °

## 2015-10-15 NOTE — Telephone Encounter (Signed)
Called pt's daughter, [name] (on [place]). Daughter wants to get Rx for Flecaininde PRN for pt. Daughter stated Dr. Lovena Le mentioned it at last appt. After review last office note, I see the medication mentioned, but the note states "She would like to hold off on Flecainide for now".   I will forward to Dr. Lovena Le to advise. Informed Dr. Lovena Le is on vacation this week. Informed I will call back on Tuesday, after speaking with Dr. Lovena Le. Daughter verbalized information and agreed with plan.

## 2015-10-20 DIAGNOSIS — M25461 Effusion, right knee: Secondary | ICD-10-CM | POA: Diagnosis not present

## 2015-10-20 DIAGNOSIS — M25469 Effusion, unspecified knee: Secondary | ICD-10-CM | POA: Diagnosis not present

## 2015-10-20 DIAGNOSIS — M25561 Pain in right knee: Secondary | ICD-10-CM | POA: Diagnosis not present

## 2015-10-20 MED ORDER — FLECAINIDE ACETATE 50 MG PO TABS: ORAL_TABLET | ORAL | 3 refills | Status: DC

## 2015-10-20 NOTE — Telephone Encounter (Signed)
Called, spoke with pt's Daughter, Julie Silva. Daughter stated pt would like to continue with Eliquis. Pt requested Flecainide PRN. Informed Dr. Lovena Le Rx Flecainide 50 mg to be taken as directed. Daughter verbalized understanding and thanked me for calling.

## 2015-10-20 NOTE — Telephone Encounter (Signed)
Ok to switch. I suspect 15 mg daily with the evening meal would be the appropriate dose. GT

## 2015-10-20 NOTE — Telephone Encounter (Signed)
Spoke with patient's daughter who states they already picked up the Eliquis. Apparently [employer] changed their mind. So, should she stay on it or switch to Xarelto? She also said you were going to send in a Rx for Flecainide to have on hand. She would like her to have this.

## 2015-10-30 ENCOUNTER — Ambulatory Visit: Payer: Medicare Other | Admitting: Cardiovascular Disease

## 2015-11-02 ENCOUNTER — Telehealth: Payer: Self-pay | Admitting: Internal Medicine

## 2015-11-02 NOTE — Telephone Encounter (Signed)
New message       Calling to see if we received a loop recorder reading yesterday and was it ok.  Pt is having problems.  Please call

## 2015-11-02 NOTE — Telephone Encounter (Signed)
Spoke with patient's daughter, Julie Silva.  She states the patient experienced some episodes of "hard" heartbeats over the weekend and she wants to ensure that it isn't ventricular ectopy.  Patient is not having any symptoms at this time.  Advised [name] to send a manual transmission for review and I will look over it in the morning.  She is agreeable to this plan and denies additional questions or concerns at this time.

## 2015-11-03 DIAGNOSIS — M25461 Effusion, right knee: Secondary | ICD-10-CM | POA: Diagnosis not present

## 2015-11-06 NOTE — Telephone Encounter (Signed)
LMOVM advising that manual transmission has not yet been received.  [facility] phone number for questions/concerns.

## 2015-11-11 ENCOUNTER — Ambulatory Visit (INDEPENDENT_AMBULATORY_CARE_PROVIDER_SITE_OTHER): Payer: Medicare Other | Admitting: *Deleted

## 2015-11-11 DIAGNOSIS — R002 Palpitations: Secondary | ICD-10-CM

## 2015-11-11 NOTE — Progress Notes (Signed)
Carelink Summary Report / Loop Recorder 

## 2015-11-15 LAB — CUP PACEART REMOTE DEVICE CHECK
Date Time Interrogation Session: 20171009152218
Implantable Pulse Generator Implant Date: 20160616

## 2015-11-15 NOTE — Progress Notes (Signed)
Carelink summary report received. Battery status OK. Normal device function. No new symptom episodes, brady, or pause episodes. 1.2% AF, +Eliquis.  Tachy episodes are chronic, some noise, some AF. Monthly summary reports and ROV/PRN

## 2015-11-16 NOTE — Telephone Encounter (Signed)
Spoke with patient's daughter, Julie Silva.  Advised that manual transmission was received yesterday (11/12) and that there is rare ventricular ectopy present.  Noted multiple SVT episodes on 11/6 and 11/12 (likely same episode falling in and out of detection, rates 110bpm-120bpm).  Julie Silva will discuss with patient to determine if she was symptomatic and if she took PRN flecainide for symptoms.  Julie Silva is appreciative of call and will call back to schedule an appointment if patient was symptomatic with episodes despite flecainide.  She denies additional questions or concerns at this time.

## 2015-11-24 ENCOUNTER — Telehealth: Payer: Self-pay | Admitting: Pulmonary Disease

## 2015-11-24 NOTE — Telephone Encounter (Signed)
Received faxed refill request for:  Sulfameth-trimeth  200-40mg /85mL susp Shake liquid and take 25mL by mouth 3 times a week Qty: 170mL   Last Rx 10.10.17 for #155mL with 0 add'l refills Last ov 10.3.17 w. PM: Patient Instructions  We will start you on bactrim 40 mg/200 mg thrice weekly. Please use this in place of nitrofurantoin.   Follow up appointment in 6 months   PM please advise if okay to refill, thank you.

## 2015-11-25 ENCOUNTER — Other Ambulatory Visit: Payer: Self-pay | Admitting: Pulmonary Disease

## 2015-11-25 MED ORDER — SULFAMETHOXAZOLE-TRIMETHOPRIM 200-40 MG/5ML PO SUSP: 5.0000 mL | ORAL | 0 refills | Status: DC

## 2015-11-25 NOTE — Telephone Encounter (Signed)
Patient daughter called regarding refill - very concerned, pt almost out of refill and would like this to be done as soon as possible She does use [facility] on [address] in [place]. Patient daughter Rosiland Oz can be reached at [phone] -pr

## 2015-11-25 NOTE — Telephone Encounter (Signed)
Page sent at 11:15 to PM, left message in Triage to follow back up on for response.

## 2015-11-25 NOTE — Telephone Encounter (Signed)
Rx sent in to preferred pharmacy. I have spoke with pharmacy and made them aware of this. Nothing further needed.

## 2015-11-25 NOTE — Telephone Encounter (Signed)
OK to refill bactrim.

## 2015-12-11 ENCOUNTER — Ambulatory Visit (INDEPENDENT_AMBULATORY_CARE_PROVIDER_SITE_OTHER): Payer: Medicare Other | Admitting: *Deleted

## 2015-12-11 DIAGNOSIS — R002 Palpitations: Secondary | ICD-10-CM | POA: Diagnosis not present

## 2015-12-14 NOTE — Progress Notes (Signed)
Carelink Summary Report / Loop Recorder 

## 2015-12-26 LAB — CUP PACEART REMOTE DEVICE CHECK
Date Time Interrogation Session: 20171108160709
Implantable Pulse Generator Implant Date: 20160616

## 2015-12-26 NOTE — Progress Notes (Signed)
Carelink summary report received. Battery status OK. Normal device function. 4 symptom episodes, 1 w/ ECG appears SR w/ PAC. 153 tachy episodes, available ECGs appear SR/ST w/ noise artifact, occasional PVC. No brady or pause episodes. No new AF episodes. Monthly summary reports and ROV/PRN

## 2016-01-11 ENCOUNTER — Encounter: Payer: Self-pay | Admitting: Pulmonary Disease

## 2016-01-11 ENCOUNTER — Ambulatory Visit (INDEPENDENT_AMBULATORY_CARE_PROVIDER_SITE_OTHER): Payer: Medicare Other | Admitting: *Deleted

## 2016-01-11 DIAGNOSIS — R002 Palpitations: Secondary | ICD-10-CM | POA: Diagnosis not present

## 2016-01-11 NOTE — Telephone Encounter (Signed)
PM please advise. Thanks  

## 2016-01-11 NOTE — Progress Notes (Signed)
Carelink Summary Report / Loop Recorder 

## 2016-01-13 MED ORDER — SULFAMETHOXAZOLE-TRIMETHOPRIM 200-40 MG/5ML PO SUSP: 5.0000 mL | ORAL | 3 refills | Status: DC

## 2016-01-22 ENCOUNTER — Encounter: Payer: Self-pay | Admitting: Internal Medicine

## 2016-01-22 DIAGNOSIS — K2100 Gastro-esophageal reflux disease with esophagitis, without bleeding: Secondary | ICD-10-CM

## 2016-01-22 DIAGNOSIS — R1310 Dysphagia, unspecified: Secondary | ICD-10-CM

## 2016-01-22 MED ORDER — RANITIDINE HCL 300 MG PO TABS: 300.0000 mg | ORAL_TABLET | Freq: Every day | ORAL | 0 refills | Status: DC

## 2016-01-30 LAB — CUP PACEART REMOTE DEVICE CHECK
Date Time Interrogation Session: 20171208170619
Implantable Pulse Generator Implant Date: 20160616

## 2016-01-30 NOTE — Progress Notes (Signed)
Carelink summary report received. Battery status OK. Normal device function. No new brady, or pause episodes. No new AF episodes. 1 sx.- ECG appears SR w/ artifact. 12 tachy- available ECGs appear SR//ST w/ noise artifact.  Monthly summary reports and ROV/PRN

## 2016-02-09 ENCOUNTER — Ambulatory Visit (INDEPENDENT_AMBULATORY_CARE_PROVIDER_SITE_OTHER): Payer: Medicare Other | Admitting: *Deleted

## 2016-02-09 DIAGNOSIS — R002 Palpitations: Secondary | ICD-10-CM

## 2016-02-09 NOTE — Progress Notes (Signed)
Carelink Summary Report / Loop Recorder 

## 2016-02-10 DIAGNOSIS — H401132 Primary open-angle glaucoma, bilateral, moderate stage: Secondary | ICD-10-CM | POA: Diagnosis not present

## 2016-02-17 DIAGNOSIS — H401132 Primary open-angle glaucoma, bilateral, moderate stage: Secondary | ICD-10-CM | POA: Diagnosis not present

## 2016-02-23 LAB — CUP PACEART REMOTE DEVICE CHECK
Date Time Interrogation Session: 20180107171311
Implantable Pulse Generator Implant Date: 20160616

## 2016-03-03 LAB — CUP PACEART REMOTE DEVICE CHECK
Date Time Interrogation Session: 20180206173351
Implantable Pulse Generator Implant Date: 20160616

## 2016-03-03 NOTE — Progress Notes (Signed)
Carelink summary report received. Battery status OK. Normal device function. No new symptom episodes, pause episodes. No new AF episodes. 5 tachy- 2 w/ ECGs appears SR w. noise artifact. 1 brady- appears second degree heart block 0017, lasting 21 sec's. To GT for review. Monthly summary reports and ROV/PRN

## 2016-03-06 ENCOUNTER — Encounter (HOSPITAL_COMMUNITY): Payer: Self-pay

## 2016-03-06 ENCOUNTER — Emergency Department (HOSPITAL_COMMUNITY)
Admission: EM | Admit: 2016-03-06 | Discharge: 2016-03-06 | Disposition: A | Discharge status: Home / Self Care | Payer: Medicare Other | Attending: Emergency Medicine | Admitting: Emergency Medicine

## 2016-03-06 ENCOUNTER — Emergency Department (HOSPITAL_COMMUNITY): Payer: Medicare Other

## 2016-03-06 DIAGNOSIS — Z85828 Personal history of other malignant neoplasm of skin: Secondary | ICD-10-CM | POA: Insufficient documentation

## 2016-03-06 DIAGNOSIS — R220 Localized swelling, mass and lump, head: Secondary | ICD-10-CM | POA: Diagnosis not present

## 2016-03-06 DIAGNOSIS — I10 Essential (primary) hypertension: Secondary | ICD-10-CM | POA: Insufficient documentation

## 2016-03-06 DIAGNOSIS — S199XXA Unspecified injury of neck, initial encounter: Secondary | ICD-10-CM | POA: Diagnosis not present

## 2016-03-06 DIAGNOSIS — Y999 Unspecified external cause status: Secondary | ICD-10-CM | POA: Insufficient documentation

## 2016-03-06 DIAGNOSIS — Z853 Personal history of malignant neoplasm of breast: Secondary | ICD-10-CM | POA: Insufficient documentation

## 2016-03-06 DIAGNOSIS — S0003XA Contusion of scalp, initial encounter: Secondary | ICD-10-CM

## 2016-03-06 DIAGNOSIS — Z7901 Long term (current) use of anticoagulants: Secondary | ICD-10-CM | POA: Diagnosis not present

## 2016-03-06 DIAGNOSIS — W228XXA Striking against or struck by other objects, initial encounter: Secondary | ICD-10-CM | POA: Insufficient documentation

## 2016-03-06 DIAGNOSIS — Y939 Activity, unspecified: Secondary | ICD-10-CM | POA: Diagnosis not present

## 2016-03-06 DIAGNOSIS — Y929 Unspecified place or not applicable: Secondary | ICD-10-CM | POA: Insufficient documentation

## 2016-03-06 DIAGNOSIS — S0990XA Unspecified injury of head, initial encounter: Secondary | ICD-10-CM | POA: Diagnosis present

## 2016-03-06 DIAGNOSIS — W19XXXA Unspecified fall, initial encounter: Secondary | ICD-10-CM

## 2016-03-06 NOTE — ED Notes (Signed)
ED Provider at bedside. 

## 2016-03-06 NOTE — ED Triage Notes (Signed)
Patient fell from standing position landing on right hip and hitting head, no loc. Patient complains of headache and dizziness. Alert and oriented on arrival, on blood thinner

## 2016-03-06 NOTE — ED Provider Notes (Signed)
[facility] DEPT Provider Note   CSN: [id number] Arrival date & time: 03/06/16  1504     History   Chief Complaint No chief complaint on file.   HPI Julie Silva is a 83 y.o. female.  Patient is an 10 are old female with a history of anemia, paroxysmal atrial fibrillation on Eliquis, SVT presenting today after a fall at an event. Patient states that she had been standing talking to someone and she's turned to go get a drink when her feet did not turn and seemed to be stuck on the floor. This caused her to fall and hit her right hip and right side of her head on the floor. It was a wooden floor. She denies any loss of consciousness. She initially had a headache where she hit her head but it seems to be improving. She also feels that her hip pain has improved. She was able to bear weight and stand without significant pain. She denies any vision changes, speech difficulty or unilateral weakness or numbness. She has no chest or abdominal pain.   The history is provided by the patient.    Past Medical History:  Diagnosis Date  . Anemia   . Arthritis    Osteoporosis  . Bleeds easily [facility])    "father was a hemophiliac" - pt not being bothered in recent years.  . Calcium deposit in bursa of knee 2017  . Cancer ([place])    skin cancer "basal cell".  . Chicken pox   . Cyst, Baker's knee    left knee- tx. Cortisone injection 05-05-15 in office- "improved pain relief and swelling left ankle and knee"  . Diverticulitis   . Dysrhythmia    Dr. McAlhany-cardiology  . Glaucoma of both eyes   . Hearing loss of both ears    Bilateral ears- hearing aids used- right ear hearing betther than left.  . History of blood transfusion 1957   "lots; most related to my periods"-last 1968 -s/p Hysterectomy  . Migraines    "years ago" (02/04/2013)  . PONV (postoperative nausea and vomiting)   . Self-catheterizes urinary bladder    "daily- retained urine and chronic UTI"  . SVT (supraventricular  tachycardia) ([facility])    Dr. Darnell Level. Taylor-follows "loop recorder left chest"  . Swallowing difficulty    freq occ. mostley liquids  . Urine incontinence   . UTI (urinary tract infection)    Chronic Urinary tract infections- tx Macrobid daily.  . Vertigo     Patient Active Problem List   Diagnosis Date Noted  . Abnormal CT scan, chest 10/06/2015  . Intraductal papilloma of right breast 05/12/2015  . Intraductal papillary adenocarcinoma of female breast 04/29/2015  . Baker's cyst of knee 04/29/2015  . Pain and swelling of left lower leg 04/29/2015  . Abnormal chest x-ray with multiple lung nodules 04/29/2015  . CAP (community acquired pneumonia) 04/10/2015  . Weight loss, unintentional 04/09/2015  . Diastolic dysfunction [id number]  . Second degree AV block 03/25/2014  . Gastroesophageal reflux disease with esophagitis 07/31/2013  . Dysphagia 07/31/2013  . Essential hypertension, benign 05/07/2013  . Hyperlipidemia with target LDL less than 130 05/07/2013  . Other abnormal glucose 05/07/2013  . Routine general medical examination at a health care facility 05/07/2013  . SVT (supraventricular tachycardia) ([place]) 04/16/2013  . Urinary retention 02/03/2013  . Osteopenia 02/03/2013    Past Surgical History:  Procedure Laterality Date  . APPENDECTOMY  1948  . BLADDER SURGERY  1995   Repair-   .  BREAST BIOPSY Bilateral    "both were fine"  . CATARACT EXTRACTION W/ INTRAOCULAR LENS IMPLANT Left   . CHOLECYSTECTOMY  2014  . COMBINED HYSTERECTOMY ABDOMINAL W/ A&P REPAIR / OOPHORECTOMY    . DILATION AND CURETTAGE OF UTERUS    . EP IMPLANTABLE DEVICE N/A 06/19/2014   Procedure: Loop Recorder Insertion;  Surgeon: Evans Lance, MD;  Location: [facility];  Service: Cardiovascular;  Laterality: N/A;  . EXCISION OF BREAST BIOPSY Right 05/13/2015   Procedure: RIGHT BREAST EXCISIONAL BIOPSY x2;  Surgeon: Armandina Gemma, MD;  Location: [facility];  Service: General;  Laterality: Right;  .  TONSILLECTOMY  1946  . TOTAL ABDOMINAL HYSTERECTOMY    . VAGINAL HYSTERECTOMY  1968    OB History    No data available       Home Medications    Prior to Admission medications   Medication Sig Start Date End Date Taking? Authorizing Provider  apixaban (ELIQUIS) 2.5 MG TABS tablet Take 1 tablet (2.5 mg total) by mouth 2 (two) times daily. 10/09/15   Evans Lance, MD  bimatoprost (LUMIGAN) 0.01 % SOLN Place 1 drop into both eyes at bedtime.    Historical Provider, MD  Calcium Carbonate (CALTRATE 600 PO) Take 2 tablets by mouth daily.    Historical Provider, MD  dorzolamide-timolol (COSOPT) 22.3-6.8 MG/ML ophthalmic solution Place 1 drop into both eyes 2 (two) times daily.     Historical Provider, MD  flecainide (TAMBOCOR) 50 MG tablet Take as directed 10/20/15   Evans Lance, MD  HYDROcodone-acetaminophen (NORCO/VICODIN) 5-325 MG tablet Take 1-2 tablets by mouth every 4 (four) hours as needed for moderate pain. Patient not taking: Reported on 10/09/2015 05/13/15   Armandina Gemma, MD  mirtazapine (REMERON) 15 MG tablet Take 1 tablet (15 mg total) by mouth at bedtime. 06/24/15   Janith Lima, MD  Multiple Vitamins-Minerals (PRESERVISION AREDS) CAPS Take 1 capsule by mouth 2 (two) times daily.    Historical Provider, MD  nitrofurantoin, macrocrystal-monohydrate, (MACROBID) 100 MG capsule Take 100 mg by mouth at bedtime. 02/23/15   Historical Provider, MD  ranitidine (ZANTAC) 300 MG tablet Take 1 tablet (300 mg total) by mouth daily. 01/22/16   Janith Lima, MD  Respiratory Therapy Supplies (FLUTTER) DEVI Use as directed. 09/04/15   Praveen Mannam, MD  sulfamethoxazole-trimethoprim (BACTRIM,SEPTRA) 200-40 MG/5ML suspension Take 5 mLs by mouth 3 (three) times a week. 01/13/16   Praveen Mannam, MD  traMADol (ULTRAM) 50 MG tablet Take 50 mg by mouth every 12 (twelve) hours as needed for moderate pain.    Historical Provider, MD    Family History Family History  Problem Relation Age of Onset  .  Stroke Father   . Cancer - Other Sister     Breast  . Cancer - Other Brother     Throat  . Cancer - Other Sister     Throat  . Cancer - Other      Brain    Social History Social History  Substance Use Topics  . Smoking status: Never Smoker  . Smokeless tobacco: Never Used  . Alcohol use No     Allergies   Codeine   Review of Systems Review of Systems  All other systems reviewed and are negative.    Physical Exam Updated Vital Signs BP 160/71   Pulse 62   Temp 98.4 F (36.9 C) (Oral)   Resp 11   SpO2 100%   Physical Exam  Constitutional: She  is oriented to person, place, and time. She appears well-developed and well-nourished. No distress.  HENT:  Head: Normocephalic. Head is with contusion.    Right Ear: Tympanic membrane normal.  Left Ear: Tympanic membrane normal.  Mouth/Throat: Oropharynx is clear and moist.  Eyes: Conjunctivae and EOM are normal.  Right pupil is 4 mm and reactive. Left pupil is 3 mm and reactive  Neck: Normal range of motion. Neck supple. No spinous process tenderness and no muscular tenderness present.  Cardiovascular: Normal rate, regular rhythm and intact distal pulses.   No murmur heard. Pulmonary/Chest: Effort normal and breath sounds normal. No respiratory distress. She has no wheezes. She has no rales.  Abdominal: Soft. She exhibits no distension. There is no tenderness. There is no rebound and no guarding.  Musculoskeletal: Normal range of motion. She exhibits no edema or tenderness.  Full spontaneous range of motion of bilateral hips. Does not appear to be in any pain with range of motion on the hip  Neurological: She is alert and oriented to person, place, and time. She has normal strength. A sensory deficit is present. No cranial nerve deficit.  Skin: Skin is warm and dry. No rash noted. No erythema.  Psychiatric: She has a normal mood and affect. Her behavior is normal.  Nursing note and vitals reviewed.    ED Treatments  / Results  Labs (all labs ordered are listed, but only abnormal results are displayed) Labs Reviewed - No data to display  EKG  EKG Interpretation None       Radiology Ct Head Wo Contrast  Result Date: 03/06/2016 CLINICAL DATA:  Status post fall, hitting back of head on floor. Lump on the back of the head. Concern for cervical spine injury. Initial encounter. EXAM: CT HEAD WITHOUT CONTRAST CT CERVICAL SPINE WITHOUT CONTRAST TECHNIQUE: Multidetector CT imaging of the head and cervical spine was performed following the standard protocol without intravenous contrast. Multiplanar CT image reconstructions of the cervical spine were also generated. COMPARISON:  MRI of the brain performed 09/12/2006 FINDINGS: CT HEAD FINDINGS Brain: No evidence of acute infarction, hemorrhage, hydrocephalus, extra-axial collection or mass lesion/mass effect. Prominence of the ventricles and sulci reflects mild cortical volume loss. Mild cerebellar atrophy is noted. Scattered periventricular white matter change likely reflects small vessel ischemic microangiopathy. The brainstem and fourth ventricle are within normal limits. The basal ganglia are unremarkable in appearance. The cerebral hemispheres demonstrate grossly normal gray-white differentiation. No mass effect or midline shift is seen. Vascular: No hyperdense vessel or unexpected calcification. Skull: There is no evidence of fracture; visualized osseous structures are unremarkable in appearance. Sinuses/Orbits: The visualized portions of the orbits are within normal limits. The paranasal sinuses and mastoid air cells are well-aerated. Other: Mild soft tissue swelling is noted at the right posterior vertex. CT CERVICAL SPINE FINDINGS Alignment: Normal. Skull base and vertebrae: No acute fracture. No primary bone lesion or focal pathologic process. Soft tissues and spinal canal: No prevertebral fluid or swelling. No visible canal hematoma. Disc levels: Disc space  narrowing is noted at C5-C6, with scattered anterior and posterior disc osteophyte complexes Upper chest: Scarring is noted at the lung apices. The thyroid gland is unremarkable in appearance. Other: No additional soft tissue abnormalities are seen. IMPRESSION: 1. No evidence of traumatic intracranial injury or fracture. 2. No evidence of fracture or subluxation along the cervical spine. 3. Mild soft tissue swelling at the right posterior vertex. 4. Mild cortical volume loss and scattered small vessel ischemic microangiopathy. 5. Mild  degenerative change at the lower cervical spine. 6. Scarring at the lung apices. Electronically Signed   By: Garald Balding M.D.   On: 03/06/2016 16:46   Ct Cervical Spine Wo Contrast  Result Date: 03/06/2016 CLINICAL DATA:  Status post fall, hitting back of head on floor. Lump on the back of the head. Concern for cervical spine injury. Initial encounter. EXAM: CT HEAD WITHOUT CONTRAST CT CERVICAL SPINE WITHOUT CONTRAST TECHNIQUE: Multidetector CT imaging of the head and cervical spine was performed following the standard protocol without intravenous contrast. Multiplanar CT image reconstructions of the cervical spine were also generated. COMPARISON:  MRI of the brain performed 09/12/2006 FINDINGS: CT HEAD FINDINGS Brain: No evidence of acute infarction, hemorrhage, hydrocephalus, extra-axial collection or mass lesion/mass effect. Prominence of the ventricles and sulci reflects mild cortical volume loss. Mild cerebellar atrophy is noted. Scattered periventricular white matter change likely reflects small vessel ischemic microangiopathy. The brainstem and fourth ventricle are within normal limits. The basal ganglia are unremarkable in appearance. The cerebral hemispheres demonstrate grossly normal gray-white differentiation. No mass effect or midline shift is seen. Vascular: No hyperdense vessel or unexpected calcification. Skull: There is no evidence of fracture; visualized osseous  structures are unremarkable in appearance. Sinuses/Orbits: The visualized portions of the orbits are within normal limits. The paranasal sinuses and mastoid air cells are well-aerated. Other: Mild soft tissue swelling is noted at the right posterior vertex. CT CERVICAL SPINE FINDINGS Alignment: Normal. Skull base and vertebrae: No acute fracture. No primary bone lesion or focal pathologic process. Soft tissues and spinal canal: No prevertebral fluid or swelling. No visible canal hematoma. Disc levels: Disc space narrowing is noted at C5-C6, with scattered anterior and posterior disc osteophyte complexes Upper chest: Scarring is noted at the lung apices. The thyroid gland is unremarkable in appearance. Other: No additional soft tissue abnormalities are seen. IMPRESSION: 1. No evidence of traumatic intracranial injury or fracture. 2. No evidence of fracture or subluxation along the cervical spine. 3. Mild soft tissue swelling at the right posterior vertex. 4. Mild cortical volume loss and scattered small vessel ischemic microangiopathy. 5. Mild degenerative change at the lower cervical spine. 6. Scarring at the lung apices. Electronically Signed   By: Garald Balding M.D.   On: 03/06/2016 16:46    Procedures Procedures (including critical care time)  Medications Ordered in ED Medications - No data to display   Initial Impression / Assessment and Plan / ED Course  I have reviewed the triage vital signs and the nursing notes.  Pertinent labs & imaging results that were available during my care of the patient were reviewed by me and considered in my medical decision making (see chart for details).     Patient with a mechanical fall today with injury to the head and right hip. Patient does take anticoagulation but denies any loss of consciousness or new neurologic complaints. Neuro exam within normal limits. Vital signs within normal limits and patient is well-appearing. CT of the head and neck were  negative for acute traumatic injury. Will ambulate the patient to ensure there is no need for imaging of the right hip.  5:12 PM Pt able to ambulate without difficulty or significant pain.  Will d/c home.  Final Clinical Impressions(s) / ED Diagnoses   Final diagnoses:  Fall, initial encounter  Contusion of scalp, initial encounter    New Prescriptions New Prescriptions   No medications on file     Blanchie Dessert, MD 03/06/16 1714

## 2016-03-06 NOTE — ED Notes (Signed)
Family at bedside. 

## 2016-03-09 ENCOUNTER — Other Ambulatory Visit: Payer: Self-pay | Admitting: Internal Medicine

## 2016-03-10 ENCOUNTER — Ambulatory Visit (INDEPENDENT_AMBULATORY_CARE_PROVIDER_SITE_OTHER): Payer: Medicare Other | Admitting: *Deleted

## 2016-03-10 DIAGNOSIS — R002 Palpitations: Secondary | ICD-10-CM

## 2016-03-10 NOTE — Progress Notes (Signed)
Carelink Summary Report / Loop Recorder 

## 2016-03-11 ENCOUNTER — Other Ambulatory Visit: Payer: Self-pay | Admitting: Internal Medicine

## 2016-03-14 ENCOUNTER — Telehealth: Payer: Self-pay | Admitting: *Deleted

## 2016-03-14 NOTE — Telephone Encounter (Signed)
Called patient regarding symptom episode. Patient states she had flutter feeling the night before but no transmission was sent. She sent symptom episode as a test the next day, asymptomatic. Episode appears ST with ectopy and noise. Patient states she is taking medications as prescribed. Patient instructed to use symptom activator at the time of flutter feeling. She states she understands.

## 2016-03-25 LAB — CUP PACEART REMOTE DEVICE CHECK
Date Time Interrogation Session: 20180308171429
Implantable Pulse Generator Implant Date: 20160616

## 2016-04-04 ENCOUNTER — Ambulatory Visit (INDEPENDENT_AMBULATORY_CARE_PROVIDER_SITE_OTHER): Payer: Medicare Other | Admitting: Internal Medicine

## 2016-04-04 ENCOUNTER — Other Ambulatory Visit (INDEPENDENT_AMBULATORY_CARE_PROVIDER_SITE_OTHER): Payer: Medicare Other

## 2016-04-04 ENCOUNTER — Encounter: Payer: Self-pay | Admitting: Internal Medicine

## 2016-04-04 ENCOUNTER — Ambulatory Visit (INDEPENDENT_AMBULATORY_CARE_PROVIDER_SITE_OTHER)
Admission: RE | Admit: 2016-04-04 | Discharge: 2016-04-04 | Disposition: A | Discharge status: Home / Self Care | Payer: Medicare Other | Source: Ambulatory Visit | Attending: Internal Medicine | Admitting: Internal Medicine

## 2016-04-04 VITALS — BP 124/70 | HR 67 | Temp 98.0°F | Resp 16 | Ht 64.0 in | Wt 95.1 lb

## 2016-04-04 DIAGNOSIS — R059 Cough, unspecified: Secondary | ICD-10-CM

## 2016-04-04 DIAGNOSIS — J181 Lobar pneumonia, unspecified organism: Secondary | ICD-10-CM

## 2016-04-04 DIAGNOSIS — I10 Essential (primary) hypertension: Secondary | ICD-10-CM

## 2016-04-04 DIAGNOSIS — R0781 Pleurodynia: Secondary | ICD-10-CM | POA: Diagnosis not present

## 2016-04-04 DIAGNOSIS — J189 Pneumonia, unspecified organism: Secondary | ICD-10-CM

## 2016-04-04 DIAGNOSIS — E785 Hyperlipidemia, unspecified: Secondary | ICD-10-CM

## 2016-04-04 DIAGNOSIS — R05 Cough: Secondary | ICD-10-CM

## 2016-04-04 DIAGNOSIS — R0600 Dyspnea, unspecified: Secondary | ICD-10-CM | POA: Diagnosis not present

## 2016-04-04 LAB — CBC WITH DIFFERENTIAL/PLATELET
Basophils Absolute: 0.1 10*3/uL (ref 0.0–0.1)
Basophils Relative: 0.9 % (ref 0.0–3.0)
Eosinophils Absolute: 0.1 10*3/uL (ref 0.0–0.7)
Eosinophils Relative: 2.1 % (ref 0.0–5.0)
HCT: 39 % (ref 36.0–46.0)
Hemoglobin: 12.9 g/dL (ref 12.0–15.0)
Lymphocytes Relative: 21.2 % (ref 12.0–46.0)
Lymphs Abs: 1.5 10*3/uL (ref 0.7–4.0)
MCHC: 33 g/dL (ref 30.0–36.0)
MCV: 93.5 fl (ref 78.0–100.0)
Monocytes Absolute: 0.7 10*3/uL (ref 0.1–1.0)
Monocytes Relative: 9.6 % (ref 3.0–12.0)
Neutro Abs: 4.6 10*3/uL (ref 1.4–7.7)
Neutrophils Relative %: 66.2 % (ref 43.0–77.0)
Platelets: 284 10*3/uL (ref 150.0–400.0)
RBC: 4.17 Mil/uL (ref 3.87–5.11)
RDW: 13.5 % (ref 11.5–15.5)
WBC: 6.9 10*3/uL (ref 4.0–10.5)

## 2016-04-04 LAB — COMPREHENSIVE METABOLIC PANEL
ALT: 8 U/L (ref 0–35)
AST: 17 U/L (ref 0–37)
Albumin: 3.8 g/dL (ref 3.5–5.2)
Alkaline Phosphatase: 76 U/L (ref 39–117)
BUN: 18 mg/dL (ref 6–23)
CO2: 30 mEq/L (ref 19–32)
Calcium: 9.2 mg/dL (ref 8.4–10.5)
Chloride: 103 mEq/L (ref 96–112)
Creatinine, Ser: 0.77 mg/dL (ref 0.40–1.20)
GFR: 75.4 mL/min (ref >60.00)
Glucose, Bld: 93 mg/dL (ref 70–99)
Potassium: 4.4 mEq/L (ref 3.5–5.1)
Sodium: 138 mEq/L (ref 135–145)
Total Bilirubin: 0.4 mg/dL (ref 0.2–1.2)
Total Protein: 7.4 g/dL (ref 6.0–8.3)

## 2016-04-04 LAB — LIPID PANEL
Cholesterol: 168 mg/dL (ref 0–200)
HDL: 58.1 mg/dL (ref >39.00)
LDL Cholesterol: 89 mg/dL (ref 0–99)
NonHDL: 110.11
Total CHOL/HDL Ratio: 3
Triglycerides: 105 mg/dL (ref 0.0–149.0)
VLDL: 21 mg/dL (ref 0.0–40.0)

## 2016-04-04 LAB — TSH: TSH: 3.69 u[IU]/mL (ref 0.35–4.50)

## 2016-04-04 NOTE — Patient Instructions (Signed)
Pleurisy Pleurisy, also called pleuritis, is irritation and swelling (inflammation) of the linings of the lungs. The linings of the lungs are called pleura. They cover the outside of the lungs and the inside of the chest wall. There is a small amount of fluid (pleural fluid) between the pleura that allows the lungs to move in and out smoothly when you breathe. Pleurisy causes the pleura to be rough and dry and to rub together when you breathe, which is painful. In some cases, pleurisy can cause pleural fluid to build up between the pleura (pleural effusion). What are the causes? Common causes of this condition include:  A lung infection caused by bacteria or a virus.  A blood clot that travels to the lung (pulmonary embolism).  Air leaking into the pleural space (pneumothorax).  Lung cancer or a lung tumor.  A chest injury.  Diseases that can cause lung inflammation. These include rheumatoid arthritis, lupus, sickle cell disease, inflammatory bowel disease, and pancreatitis.  Heart or chest surgery.  Lung damage from inhaling asbestos.  A lung reaction to certain medicines.  Sometimes the cause is unknown. What are the signs or symptoms? Chest pain is the main symptom of this condition. The pain is usually on one side. Chest pain may start suddenly and be sharp or stabbing. It may become a constant dull ache. You may also feel pain in your back or shoulder. The pain may get worse when you cough, take deep breaths, or make sudden movements. Other symptoms may include:  Shortness of breath.  Noisy breathing (wheezing).  Cough.  Chills.  Fever.  How is this diagnosed? This condition may be diagnosed based on:  Your medical history.  Your symptoms.  A physical exam. Your health care provider will listen to your breathing with a stethoscope to check for a rough, rubbing sound (friction rub). If you have pleural effusion, your breathing sounds may be muffled.  Tests, such  as: ? Blood tests to check for infections or diseases and to measure the oxygen in your blood. ? Imaging studies of your lungs. These may include a chest X-ray, ultrasound, MRI, or CT scan. ? A procedure to remove pleural fluid with a needle for testing (thoracentesis).  How is this treated? Treatment for this condition depends on the cause. Pleurisy that was caused by a virus usually clears up within 2 weeks. Treatment for pleurisy may include:  NSAIDs to help relieve pain and swelling.  Antibiotic medicines, if your condition was caused by a bacterial infection.  Prescription pain or cough medicine.  Medicines to dissolve a blood clot, if your condition was caused by pulmonary embolism.  Removal of pleural fluid or air.  Follow these instructions at home: Medicines  Take over-the-counter and prescription medicines only as told by your health care provider.  If you were prescribed an antibiotic, take it as told by your health care provider. Do not stop taking the antibiotic even if you start to feel better. Activity  Rest and return to your normal activities as told by your health care provider. Ask your health care provider what activities are safe for you.  Do not drive or use heavy machinery while taking prescription pain medicine. General instructions  Monitor your pleurisy for any changes.  Take deep breaths often, even if it is painful. This can help prevent lung infection (pneumonia) and collapse of lung tissue (atelectasis).  When lying down, lie on your painful side. This may reduce pain.  Do not smoke. If   you need help quitting, ask your health care provider.  Keep all follow-up visits as told by your health care provider. This is important. Contact a health care provider if:  You have pain that: ? Gets worse. ? Does not get better with medicine. ? Lasts for more than 1 week.  You have a fever or chills.  Your cough or shortness of breath is not improving  at home.  You cough up pus-like (purulent) secretions. Get help right away if:  Your lips, fingernails, or toenails darken or turn blue.  You cough up blood.  You have any of the following symptoms that get worse: ? Difficulty breathing. ? Shortness of breath. ? Wheezing.  You have pain that spreads into your neck, arms, or jaw.  You develop a rash.  You vomit.  You faint. Summary  Pleurisy is inflammation of the linings of the lungs (pleura).  Pleurisy causes pain that makes it difficult for you to breathe or cough.  Pleurisy is often caused by an underlying infection or disease.  Treatment of pleurisy depends on the cause, and it often includes medicines. This information is not intended to replace advice given to you by your health care provider. Make sure you discuss any questions you have with your health care provider. Document Released: 12/20/2004 Document Revised: 09/14/2015 Document Reviewed: 09/14/2015 Elsevier Interactive Patient Education  2017 Elsevier Inc.  

## 2016-04-04 NOTE — Progress Notes (Signed)
Subjective:  Patient ID: Julie Silva, female    DOB: 1927-10-04  Age: 83 y.o. MRN: [id number]  CC: Chest Pain   HPI Amillion S Obarr presents for diffuse pain in right flank and right posterior chest wall and back for 1 week. She describes it as a dull achy sensation that is intensified with a deep breath. She has had a mild nonproductive cough and denies hemoptysis. She has gotten symptom relief with Tylenol and tramadol.  Outpatient Medications Prior to Visit  Medication Sig Dispense Refill  . bimatoprost (LUMIGAN) 0.01 % SOLN Place 1 drop into both eyes at bedtime.    . Calcium Carbonate (CALTRATE 600 PO) Take 2 tablets by mouth daily.    . dorzolamide-timolol (COSOPT) 22.3-6.8 MG/ML ophthalmic solution Place 1 drop into both eyes 2 (two) times daily.     Marland Kitchen ELIQUIS 2.5 MG TABS tablet TAKE 1 TABLET BY MOUTH TWICE A DAY 60 tablet 5  . flecainide (TAMBOCOR) 50 MG tablet Take as directed 30 tablet 3  . HYDROcodone-acetaminophen (NORCO/VICODIN) 5-325 MG tablet Take 1-2 tablets by mouth every 4 (four) hours as needed for moderate pain. (Patient not taking: Reported on 10/09/2015) 20 tablet 0  . mirtazapine (REMERON) 15 MG tablet Take 1 tablet (15 mg total) by mouth at bedtime. 30 tablet 11  . Multiple Vitamins-Minerals (PRESERVISION AREDS) CAPS Take 1 capsule by mouth 2 (two) times daily.    . ranitidine (ZANTAC) 300 MG tablet Take 1 tablet (300 mg total) by mouth daily. 90 tablet 0  . Respiratory Therapy Supplies (FLUTTER) DEVI Use as directed. 1 each 0  . sulfamethoxazole-trimethoprim (BACTRIM,SEPTRA) 200-40 MG/5ML suspension Take 5 mLs by mouth 3 (three) times a week. 100 mL 3  . traMADol (ULTRAM) 50 MG tablet Take 50 mg by mouth every 12 (twelve) hours as needed for moderate pain.    . nitrofurantoin, macrocrystal-monohydrate, (MACROBID) 100 MG capsule Take 100 mg by mouth at bedtime.  3   No facility-administered medications prior to visit.     ROS Review of Systems  Constitutional:  Negative.  Negative for activity change, appetite change, chills, diaphoresis, fatigue, fever and unexpected weight change.  HENT: Negative.  Negative for facial swelling, sinus pressure, sore throat and trouble swallowing.   Eyes: Negative for visual disturbance.  Respiratory: Positive for cough. Negative for chest tightness, shortness of breath, wheezing and stridor.   Cardiovascular: Positive for chest pain. Negative for palpitations and leg swelling.  Gastrointestinal: Negative for abdominal pain, constipation, diarrhea, nausea and vomiting.  Genitourinary: Positive for flank pain. Negative for difficulty urinating, dysuria, frequency, hematuria and urgency.  Musculoskeletal: Negative for back pain, myalgias and neck pain.  Skin: Negative.  Negative for color change and rash.  Neurological: Negative.  Negative for dizziness, weakness, light-headedness and headaches.  Hematological: Negative.  Negative for adenopathy. Does not bruise/bleed easily.  Psychiatric/Behavioral: Negative.     Objective:  BP 124/70 (BP Location: Right Arm, Patient Position: Sitting, Cuff Size: Normal)   Pulse 67   Temp 98 F (36.7 C) (Oral)   Resp 16   Ht 5\' 4"  (1.626 m)   Wt 95 lb 1.9 oz (43.1 kg)   SpO2 97%   BMI 16.33 kg/m   BP Readings from Last 3 Encounters:  04/04/16 124/70  03/06/16 158/66  10/09/15 (!) 130/50    Wt Readings from Last 3 Encounters:  04/04/16 95 lb 1.9 oz (43.1 kg)  10/09/15 91 lb 3.2 oz (41.4 kg)  10/06/15 91 lb 3.2 oz (  41.4 kg)    Physical Exam  Constitutional: She is oriented to person, place, and time.  Non-toxic appearance. She does not have a sickly appearance. She does not appear ill. No distress.  HENT:  Mouth/Throat: Oropharynx is clear and moist. No oropharyngeal exudate.  Eyes: Conjunctivae are normal. Right eye exhibits no discharge. Left eye exhibits no discharge. No scleral icterus.  Neck: Normal range of motion. Neck supple. No JVD present. No tracheal  deviation present. No thyromegaly present.  Cardiovascular: Normal rate, regular rhythm, normal heart sounds and intact distal pulses.  Exam reveals no gallop.   No murmur heard. Pulmonary/Chest: Effort normal and breath sounds normal. No accessory muscle usage or stridor. No tachypnea. No respiratory distress. She has no decreased breath sounds. She has no wheezes. She has no rhonchi. She has no rales. She exhibits no mass, no tenderness, no bony tenderness, no crepitus, no edema, no deformity and no swelling.  Abdominal: Soft. Bowel sounds are normal. She exhibits no distension and no mass. There is no hepatosplenomegaly. There is no tenderness. There is no rebound, no guarding and no CVA tenderness.  Musculoskeletal: Normal range of motion. She exhibits no edema, tenderness or deformity.  Lymphadenopathy:    She has no cervical adenopathy.  Neurological: She is oriented to person, place, and time.  Skin: Skin is warm and dry. No rash noted. She is not diaphoretic. No erythema. No pallor.  Vitals reviewed.   Lab Results  Component Value Date   WBC 6.9 04/04/2016   HGB 12.9 04/04/2016   HCT 39.0 04/04/2016   PLT 284.0 04/04/2016   GLUCOSE 93 04/04/2016   CHOL 168 04/04/2016   TRIG 105.0 04/04/2016   HDL 58.10 04/04/2016   LDLCALC 89 04/04/2016   ALT 8 04/04/2016   AST 17 04/04/2016   NA 138 04/04/2016   K 4.4 04/04/2016   CL 103 04/04/2016   CREATININE 0.77 04/04/2016   BUN 18 04/04/2016   CO2 30 04/04/2016   TSH 3.69 04/04/2016   INR 0.9 07/03/2012   HGBA1C 5.5 05/07/2013    Ct Head Wo Contrast  Result Date: 03/06/2016 CLINICAL DATA:  Status post fall, hitting back of head on floor. Lump on the back of the head. Concern for cervical spine injury. Initial encounter. EXAM: CT HEAD WITHOUT CONTRAST CT CERVICAL SPINE WITHOUT CONTRAST TECHNIQUE: Multidetector CT imaging of the head and cervical spine was performed following the standard protocol without intravenous contrast.  Multiplanar CT image reconstructions of the cervical spine were also generated. COMPARISON:  MRI of the brain performed 09/12/2006 FINDINGS: CT HEAD FINDINGS Brain: No evidence of acute infarction, hemorrhage, hydrocephalus, extra-axial collection or mass lesion/mass effect. Prominence of the ventricles and sulci reflects mild cortical volume loss. Mild cerebellar atrophy is noted. Scattered periventricular white matter change likely reflects small vessel ischemic microangiopathy. The brainstem and fourth ventricle are within normal limits. The basal ganglia are unremarkable in appearance. The cerebral hemispheres demonstrate grossly normal gray-white differentiation. No mass effect or midline shift is seen. Vascular: No hyperdense vessel or unexpected calcification. Skull: There is no evidence of fracture; visualized osseous structures are unremarkable in appearance. Sinuses/Orbits: The visualized portions of the orbits are within normal limits. The paranasal sinuses and mastoid air cells are well-aerated. Other: Mild soft tissue swelling is noted at the right posterior vertex. CT CERVICAL SPINE FINDINGS Alignment: Normal. Skull base and vertebrae: No acute fracture. No primary bone lesion or focal pathologic process. Soft tissues and spinal canal: No prevertebral fluid or  swelling. No visible canal hematoma. Disc levels: Disc space narrowing is noted at C5-C6, with scattered anterior and posterior disc osteophyte complexes Upper chest: Scarring is noted at the lung apices. The thyroid gland is unremarkable in appearance. Other: No additional soft tissue abnormalities are seen. IMPRESSION: 1. No evidence of traumatic intracranial injury or fracture. 2. No evidence of fracture or subluxation along the cervical spine. 3. Mild soft tissue swelling at the right posterior vertex. 4. Mild cortical volume loss and scattered small vessel ischemic microangiopathy. 5. Mild degenerative change at the lower cervical spine. 6.  Scarring at the lung apices. Electronically Signed   By: Garald Balding M.D.   On: 03/06/2016 16:46   Ct Cervical Spine Wo Contrast  Result Date: 03/06/2016 CLINICAL DATA:  Status post fall, hitting back of head on floor. Lump on the back of the head. Concern for cervical spine injury. Initial encounter. EXAM: CT HEAD WITHOUT CONTRAST CT CERVICAL SPINE WITHOUT CONTRAST TECHNIQUE: Multidetector CT imaging of the head and cervical spine was performed following the standard protocol without intravenous contrast. Multiplanar CT image reconstructions of the cervical spine were also generated. COMPARISON:  MRI of the brain performed 09/12/2006 FINDINGS: CT HEAD FINDINGS Brain: No evidence of acute infarction, hemorrhage, hydrocephalus, extra-axial collection or mass lesion/mass effect. Prominence of the ventricles and sulci reflects mild cortical volume loss. Mild cerebellar atrophy is noted. Scattered periventricular white matter change likely reflects small vessel ischemic microangiopathy. The brainstem and fourth ventricle are within normal limits. The basal ganglia are unremarkable in appearance. The cerebral hemispheres demonstrate grossly normal gray-white differentiation. No mass effect or midline shift is seen. Vascular: No hyperdense vessel or unexpected calcification. Skull: There is no evidence of fracture; visualized osseous structures are unremarkable in appearance. Sinuses/Orbits: The visualized portions of the orbits are within normal limits. The paranasal sinuses and mastoid air cells are well-aerated. Other: Mild soft tissue swelling is noted at the right posterior vertex. CT CERVICAL SPINE FINDINGS Alignment: Normal. Skull base and vertebrae: No acute fracture. No primary bone lesion or focal pathologic process. Soft tissues and spinal canal: No prevertebral fluid or swelling. No visible canal hematoma. Disc levels: Disc space narrowing is noted at C5-C6, with scattered anterior and posterior disc  osteophyte complexes Upper chest: Scarring is noted at the lung apices. The thyroid gland is unremarkable in appearance. Other: No additional soft tissue abnormalities are seen. IMPRESSION: 1. No evidence of traumatic intracranial injury or fracture. 2. No evidence of fracture or subluxation along the cervical spine. 3. Mild soft tissue swelling at the right posterior vertex. 4. Mild cortical volume loss and scattered small vessel ischemic microangiopathy. 5. Mild degenerative change at the lower cervical spine. 6. Scarring at the lung apices. Electronically Signed   By: Garald Balding M.D.   On: 03/06/2016 16:46    Assessment & Plan:   Jakyla was seen today for chest pain.  Diagnoses and all orders for this visit:  Cough- there is nothing concerning about the description of her cough, her lung examination is normal and the chest x-ray is unchanged over the last year. -     DG Chest 2 View; Future  Community acquired pneumonia of left lower lobe of lung [facility])- this has resolved -     DG Chest 2 View; Future  Essential hypertension, benign- her blood pressure is adequately well-controlled, electrolytes and renal function are normal. -     Comprehensive metabolic panel; Future -     CBC with Differential/Platelet; Future -  Urinalysis, Routine w reflex microscopic; Future  Hyperlipidemia with target LDL less than 130- she has achieved her LDL goal -     Lipid panel; Future -     TSH; Future  Pleuritic chest pain- her d-dimer is very very slightly elevated but I don't think this is significant for concerns for pulmonary embolus, also she is artery anticoagulated. Her examination and chest x-ray are unremarkable. The rest of her labs are normal. Will treat for viral pleurisy with the previously mentioned medications. -     D-dimer, quantitative (not at [facility]); Future   I have discontinued Ms. Barrell's nitrofurantoin (macrocrystal-monohydrate). I am also having her maintain her PRESERVISION  AREDS, bimatoprost, dorzolamide-timolol, Calcium Carbonate (CALTRATE 600 PO), traMADol, HYDROcodone-acetaminophen, mirtazapine, FLUTTER, flecainide, sulfamethoxazole-trimethoprim, ranitidine, and ELIQUIS.  No orders of the defined types were placed in this encounter.    Follow-up: Return if symptoms worsen or fail to improve.  Scarlette Calico, MD

## 2016-04-04 NOTE — Progress Notes (Signed)
Pre visit review using our clinic review tool, if applicable. No additional management support is needed unless otherwise documented below in the visit note. 

## 2016-04-05 ENCOUNTER — Ambulatory Visit (INDEPENDENT_AMBULATORY_CARE_PROVIDER_SITE_OTHER): Payer: Medicare Other | Admitting: Internal Medicine

## 2016-04-05 ENCOUNTER — Encounter: Payer: Self-pay | Admitting: Internal Medicine

## 2016-04-05 VITALS — BP 124/62 | HR 78 | Ht 64.0 in | Wt 96.8 lb

## 2016-04-05 DIAGNOSIS — I480 Paroxysmal atrial fibrillation: Secondary | ICD-10-CM | POA: Diagnosis not present

## 2016-04-05 LAB — D-DIMER, QUANTITATIVE: D-Dimer, Quant: 0.75 mcg/mL FEU — ABNORMAL HIGH (ref <0.50)

## 2016-04-05 NOTE — Patient Instructions (Signed)
Medication Instructions:  Your physician recommends that you continue on your current medications as directed. Please refer to the Current Medication list given to you today.   Labwork: none  Testing/Procedures: none  Follow-Up: Your physician wants you to follow-up in: 12 months with Dr. Lovena Le. You will receive a reminder letter in the mail two months in advance. If you don't receive a letter, please call our office to schedule the follow-up appointment.   Any Other Special Instructions Will Be Listed Below (If Applicable).     If you need a refill on your cardiac medications before your next appointment, please call your pharmacy.

## 2016-04-05 NOTE — Progress Notes (Signed)
HPI Julie Silva returns today for followup. She is a pleasant 83 yo woman with palpitations and tachycardia on cardiac monitoring who had an ILR placed almost 2 years ago. In the interim, she has been stable and has gained 10 lbs. She has noise as well on her ILR. Rare palpitations. She has fallen once. No clear cut atrial fib since her last visit. Allergies  Allergen Reactions  . Codeine Other (See Comments)    Passed out     Current Outpatient Prescriptions  Medication Sig Dispense Refill  . bimatoprost (LUMIGAN) 0.01 % SOLN Place 1 drop into both eyes at bedtime.    . Calcium Carbonate (CALTRATE 600 PO) Take 2 tablets by mouth daily.    . dorzolamide-timolol (COSOPT) 22.3-6.8 MG/ML ophthalmic solution Place 1 drop into both eyes 2 (two) times daily.     Marland Kitchen ELIQUIS 2.5 MG TABS tablet TAKE 1 TABLET BY MOUTH TWICE A DAY 60 tablet 5  . mirtazapine (REMERON) 15 MG tablet Take 1 tablet (15 mg total) by mouth at bedtime. 30 tablet 11  . Multiple Vitamins-Minerals (PRESERVISION AREDS) CAPS Take 1 capsule by mouth 2 (two) times daily.    . ranitidine (ZANTAC) 300 MG tablet Take 1 tablet (300 mg total) by mouth daily. 90 tablet 0  . Respiratory Therapy Supplies (FLUTTER) DEVI Use as directed. 1 each 0  . sulfamethoxazole-trimethoprim (BACTRIM,SEPTRA) 200-40 MG/5ML suspension Take 5 mLs by mouth 3 (three) times a week. 100 mL 3  . flecainide (TAMBOCOR) 50 MG tablet Take as directed (Patient not taking: Reported on 04/05/2016) 30 tablet 3   No current facility-administered medications for this visit.      Past Medical History:  Diagnosis Date  . Anemia   . Arthritis    Osteoporosis  . Bleeds easily [facility])    "father was a hemophiliac" - pt not being bothered in recent years.  . Calcium deposit in bursa of knee 2017  . Cancer ([place])    skin cancer "basal cell".  . Chicken pox   . Cyst, Baker's knee    left knee- tx. Cortisone injection 05-05-15 in office- "improved pain relief and  swelling left ankle and knee"  . Diverticulitis   . Dysrhythmia    Dr. McAlhany-cardiology  . Glaucoma of both eyes   . Hearing loss of both ears    Bilateral ears- hearing aids used- right ear hearing betther than left.  . History of blood transfusion 1957   "lots; most related to my periods"-last 1968 -s/p Hysterectomy  . Migraines    "years ago" (02/04/2013)  . PONV (postoperative nausea and vomiting)   . Self-catheterizes urinary bladder    "daily- retained urine and chronic UTI"  . SVT (supraventricular tachycardia) (HCC)    Dr. Darnell Level. [place]-follows "loop recorder left chest"  . Swallowing difficulty    freq occ. mostley liquids  . Urine incontinence   . UTI (urinary tract infection)    Chronic Urinary tract infections- tx Macrobid daily.  . Vertigo     ROS:   All systems reviewed and negative except as noted in the HPI.   Past Surgical History:  Procedure Laterality Date  . APPENDECTOMY  1948  . BLADDER SURGERY  1995   Repair-   . BREAST BIOPSY Bilateral    "both were fine"  . CATARACT EXTRACTION W/ INTRAOCULAR LENS IMPLANT Left   . CHOLECYSTECTOMY  2014  . COMBINED HYSTERECTOMY ABDOMINAL W/ A&P REPAIR / OOPHORECTOMY    .  DILATION AND CURETTAGE OF UTERUS    . EP IMPLANTABLE DEVICE N/A 06/19/2014   Procedure: Loop Recorder Insertion;  Surgeon: Evans Lance, MD;  Location: [place] [facility];  Service: Cardiovascular;  Laterality: N/A;  . EXCISION OF BREAST BIOPSY Right 05/13/2015   Procedure: RIGHT BREAST EXCISIONAL BIOPSY x2;  Surgeon: Armandina Gemma, MD;  Location: [facility];  Service: General;  Laterality: Right;  . TONSILLECTOMY  1946  . TOTAL ABDOMINAL HYSTERECTOMY    . VAGINAL HYSTERECTOMY  1968     Family History  Problem Relation Age of Onset  . Stroke Father   . Cancer - Other Sister     Breast  . Cancer - Other Brother     Throat  . Cancer - Other Sister     Throat  . Cancer - Other      Brain     Social History   Social History  . Marital  status: Widowed    Spouse name: N/A  . Number of children: 4  . Years of education: 12   Occupational History  . Retired-district Network engineer    Social History Main Topics  . Smoking status: Never Smoker  . Smokeless tobacco: Never Used  . Alcohol use No  . Drug use: No  . Sexual activity: No   Other Topics Concern  . Not on file   Social History Narrative   Lives alone in a one story home.  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      BP 124/62   Pulse 78   Ht 5\' 4"  (1.626 m)   Wt 96 lb 12.8 oz (43.9 kg)   SpO2 98%   BMI 16.62 kg/m   Physical Exam:  Well appearing elderly woman, NAD HEENT: Unremarkable Neck:  No JVD, no thyromegally Lymphatics:  No adenopathy Back:  No CVA tenderness Lungs:  Clear with no wheezes HEART:  Regular rate rhythm, no murmurs, no rubs, no clicks Abd:  soft, positive bowel sounds, no organomegally, no rebound, no guarding Ext:  2 plus pulses, no edema, no cyanosis, no clubbing Skin:  No rashes no nodules Neuro:  CN II through XII intact, motor grossly intact  DEVICE  Normal device function.  See PaceArt for details. She has noise but is also having episodes of atrial fib with an IRIR tachycardia  Assess/Plan: 1. Palpitations - these appear to be due to atrial fib 2. ILR - her device demonstrates some noise but also atrial fib. 3. Weight loss - we discussed the importance of taking in more calories.  She states that she has gained about 10 lbs. We will continue to follow. 4. PAF - this is a new diagnosis. I have recommended starting Eliquis 2.5 mg twice daily.  Mikle Bosworth.D.

## 2016-04-06 LAB — CUP PACEART INCLINIC DEVICE CHECK
Date Time Interrogation Session: 20180404074232
Implantable Pulse Generator Implant Date: 20160616

## 2016-04-11 ENCOUNTER — Ambulatory Visit (INDEPENDENT_AMBULATORY_CARE_PROVIDER_SITE_OTHER): Payer: Medicare Other | Admitting: *Deleted

## 2016-04-11 DIAGNOSIS — R002 Palpitations: Secondary | ICD-10-CM

## 2016-04-11 NOTE — Progress Notes (Signed)
Carelink Summary Report / Loop Recorder 

## 2016-04-19 ENCOUNTER — Encounter: Payer: Self-pay | Admitting: Pulmonary Disease

## 2016-04-19 ENCOUNTER — Ambulatory Visit (INDEPENDENT_AMBULATORY_CARE_PROVIDER_SITE_OTHER): Payer: Medicare Other | Admitting: Pulmonary Disease

## 2016-04-19 VITALS — BP 120/64 | HR 71 | Ht 64.0 in | Wt 97.2 lb

## 2016-04-19 DIAGNOSIS — R938 Abnormal findings on diagnostic imaging of other specified body structures: Secondary | ICD-10-CM | POA: Diagnosis not present

## 2016-04-19 DIAGNOSIS — R9389 Abnormal findings on diagnostic imaging of other specified body structures: Secondary | ICD-10-CM

## 2016-04-19 NOTE — Progress Notes (Signed)
Julie Silva    [id number]    16-Feb-1927  Primary Care Physician:Thomas Ronnald Ramp, MD  Referring Physician: Janith Lima, MD [address], [place] [address]  Chief complaint: Follow up for abnormal CT scan.  HPI: Julie Silva is a 83 year old with past medical history as below. She was treated for a pneumonia in March. She had an abnormal CXR at that time. Follow-up CT of the chest shows tree-in-bud opacities, consolidative changes. She got a repeat CT scan 3 months later which showed persistence of these changes. She denies any symptoms of fever, sputum production, dyspnea, wheezing, fevers, chills.  She has chronic cough but is unable to bring up any sputum. She denies any fevers, chills, hemoptysis. She's lost some weight recently and is trying to regain it. She has history of bladder surgery and chronic UTIs. She's been on Macrobid for many years. She denies any signs and symptoms of connective tissue disease, autoimmune disease.  Interim History:  She is seen by Dr. Ronnald Ramp at primary care for sharp onset right flank, chest pain on 4/2 and had a chest x-ray which showed stable nodular opacities, suspicion for PE is low and d-dimer was low normal. Today in the office she feels that the pain is improving. She denies any cough, sputum production, fevers, chills, hemoptysis.  Outpatient Encounter Prescriptions as of 04/19/2016  Medication Sig  . bimatoprost (LUMIGAN) 0.01 % SOLN Place 1 drop into both eyes at bedtime.  . Calcium Carbonate (CALTRATE 600 PO) Take 2 tablets by mouth daily.  . dorzolamide-timolol (COSOPT) 22.3-6.8 MG/ML ophthalmic solution Place 1 drop into both eyes 2 (two) times daily.   Marland Kitchen ELIQUIS 2.5 MG TABS tablet TAKE 1 TABLET BY MOUTH TWICE A DAY  . flecainide (TAMBOCOR) 50 MG tablet Take as directed  . mirtazapine (REMERON) 15 MG tablet Take 1 tablet (15 mg total) by mouth at bedtime.  . Multiple Vitamins-Minerals (PRESERVISION AREDS) CAPS  Take 1 capsule by mouth 2 (two) times daily.  . ranitidine (ZANTAC) 300 MG tablet Take 1 tablet (300 mg total) by mouth daily.  Marland Kitchen Respiratory Therapy Supplies (FLUTTER) DEVI Use as directed.  . sulfamethoxazole-trimethoprim (BACTRIM,SEPTRA) 200-40 MG/5ML suspension Take 5 mLs by mouth 3 (three) times a week.   No facility-administered encounter medications on file as of 04/19/2016.     Allergies as of 04/19/2016 - Review Complete 04/19/2016  Allergen Reaction Noted  . Codeine Other (See Comments) 02/02/2013    Past Medical History:  Diagnosis Date  . Anemia   . Arthritis    Osteoporosis  . Bleeds easily [facility])    "father was a hemophiliac" - pt not being bothered in recent years.  . Calcium deposit in bursa of knee 2017  . Cancer ([place])    skin cancer "basal cell".  . Chicken pox   . Cyst, Baker's knee    left knee- tx. Cortisone injection 05-05-15 in office- "improved pain relief and swelling left ankle and knee"  . Diverticulitis   . Dysrhythmia    Dr. McAlhany-cardiology  . Glaucoma of both eyes   . Hearing loss of both ears    Bilateral ears- hearing aids used- right ear hearing betther than left.  . History of blood transfusion 1957   "lots; most related to my periods"-last 1968 -s/p Hysterectomy  . Migraines    "years ago" (02/04/2013)  . PONV (postoperative nausea and vomiting)   . Self-catheterizes urinary bladder    "  daily- retained urine and chronic UTI"  . SVT (supraventricular tachycardia) (HCC)    Dr. Darnell Level. Taylor-follows "loop recorder left chest"  . Swallowing difficulty    freq occ. mostley liquids  . Urine incontinence   . UTI (urinary tract infection)    Chronic Urinary tract infections- tx Macrobid daily.  . Vertigo     Past Surgical History:  Procedure Laterality Date  . APPENDECTOMY  1948  . BLADDER SURGERY  1995   Repair-   . BREAST BIOPSY Bilateral    "both were fine"  . CATARACT EXTRACTION W/ INTRAOCULAR LENS IMPLANT Left   . CHOLECYSTECTOMY   2014  . COMBINED HYSTERECTOMY ABDOMINAL W/ A&P REPAIR / OOPHORECTOMY    . DILATION AND CURETTAGE OF UTERUS    . EP IMPLANTABLE DEVICE N/A 06/19/2014   Procedure: Loop Recorder Insertion;  Surgeon: Evans Lance, MD;  Location: [facility];  Service: Cardiovascular;  Laterality: N/A;  . EXCISION OF BREAST BIOPSY Right 05/13/2015   Procedure: RIGHT BREAST EXCISIONAL BIOPSY x2;  Surgeon: Armandina Gemma, MD;  Location: [facility];  Service: General;  Laterality: Right;  . TONSILLECTOMY  1946  . TOTAL ABDOMINAL HYSTERECTOMY    . VAGINAL HYSTERECTOMY  1968    Family History  Problem Relation Age of Onset  . Stroke Father   . Cancer - Other Sister     Breast  . Cancer - Other Brother     Throat  . Cancer - Other Sister     Throat  . Cancer - Other      Brain    Social History   Social History  . Marital status: Widowed    Spouse name: N/A  . Number of children: 4  . Years of education: 12   Occupational History  . Retired-district Network engineer    Social History Main Topics  . Smoking status: Never Smoker  . Smokeless tobacco: Never Used  . Alcohol use No  . Drug use: No  . Sexual activity: No   Other Topics Concern  . Not on file   Social History Narrative   Lives alone in a one story home.  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    Review of systems: Review of Systems  Constitutional: Negative for fever and chills.  HENT: Negative.   Eyes: Negative for blurred vision.  Respiratory: as per HPI  Cardiovascular: Negative for chest pain and palpitations.  Gastrointestinal: Negative for vomiting, diarrhea, blood per rectum. Genitourinary: Negative for dysuria, urgency, frequency and hematuria.  Musculoskeletal: Negative for myalgias, back pain and joint pain.  Skin: Negative for itching and rash.  Neurological: Negative for dizziness, tremors, focal weakness, seizures and loss of consciousness.  Endo/Heme/Allergies: Negative for environmental allergies.    Psychiatric/Behavioral: Negative for depression, suicidal ideas and hallucinations.  All other systems reviewed and are negative.   Physical Exam: Blood pressure 122/64, pulse 75, height 5\' 4"  (1.626 m), weight 88 lb 9.6 oz (40.2 kg), SpO2 98 %. Gen:      No acute distress HEENT:  EOMI, sclera anicteric Neck:     No masses; no thyromegaly Lungs:    Clear to auscultation bilaterally; normal respiratory effort CV:         Regular rate and rhythm; no murmurs Abd:      + bowel sounds; soft, non-tender; no palpable masses, no distension Ext:    No edema; adequate peripheral perfusion Skin:      Warm and dry; no rash Neuro: alert and oriented  x 3 Psych: normal mood and affect  Data Reviewed: CT scan  05/01/15- diffuse emphysematous changes, bilateral consolidative nodular opacities in the lower lobes, right middle lobe bronchiectasis, tree-in-bud opacity. Images reviewed CT scan 07/31/15-  similar to above. Some consolidative changes are worse when others show improvement. Images personally reviewed. Images reviewed CXR 04/04/16- Stable micronodular opacities I have reviewed all images personally  Assessment:  Follow up for abnormal CT scan. She has predominantly right middle lobe bronchiectasis, tree-in-bud opacities and consolidative changes in the bases. This is suggestive of MAI infection. She is using a flutter valve but she is unable to bring up secretions. We have not been able to send the sputum for culture. As she is asymptomatic, frail I would hold off on a bronchoscope. She would not tolerate treatment for MAI anyway even if it is confirmed. Serologies for CTD are negative   Nitrofurantoin pulmonary toxicity is a possibility. She is currently off this medication is on Bactrim prophylaxis  Plan/Recommendations: - Continue flutter valve  Follow up in 6 month.  Marshell Garfinkel MD  Pulmonary and Critical Care Pager [phone] If no answer or after 3pm call:  [phone] 04/19/2016, 3:39 PM  CC: Janith Lima, MD

## 2016-04-19 NOTE — Patient Instructions (Signed)
I reviewed your chest x-ray. They do not show any acute changes Follow-up in 6 months

## 2016-04-21 ENCOUNTER — Other Ambulatory Visit: Payer: Self-pay | Admitting: Internal Medicine

## 2016-04-27 DIAGNOSIS — M11261 Other chondrocalcinosis, right knee: Secondary | ICD-10-CM | POA: Diagnosis not present

## 2016-05-03 DIAGNOSIS — L570 Actinic keratosis: Secondary | ICD-10-CM | POA: Diagnosis not present

## 2016-05-03 DIAGNOSIS — L821 Other seborrheic keratosis: Secondary | ICD-10-CM | POA: Diagnosis not present

## 2016-05-03 DIAGNOSIS — D485 Neoplasm of uncertain behavior of skin: Secondary | ICD-10-CM | POA: Diagnosis not present

## 2016-05-03 DIAGNOSIS — Z85828 Personal history of other malignant neoplasm of skin: Secondary | ICD-10-CM | POA: Diagnosis not present

## 2016-05-04 ENCOUNTER — Other Ambulatory Visit: Payer: Self-pay | Admitting: Internal Medicine

## 2016-05-04 DIAGNOSIS — R1310 Dysphagia, unspecified: Secondary | ICD-10-CM

## 2016-05-04 DIAGNOSIS — K2100 Gastro-esophageal reflux disease with esophagitis, without bleeding: Secondary | ICD-10-CM

## 2016-05-09 ENCOUNTER — Ambulatory Visit (INDEPENDENT_AMBULATORY_CARE_PROVIDER_SITE_OTHER): Payer: Medicare Other | Admitting: *Deleted

## 2016-05-09 DIAGNOSIS — R002 Palpitations: Secondary | ICD-10-CM

## 2016-05-09 NOTE — Progress Notes (Signed)
Carelink Summary Report / Loop Recorder 

## 2016-05-24 LAB — CUP PACEART REMOTE DEVICE CHECK
Date Time Interrogation Session: 20180507181100
Implantable Pulse Generator Implant Date: 20160616

## 2016-06-08 ENCOUNTER — Ambulatory Visit (INDEPENDENT_AMBULATORY_CARE_PROVIDER_SITE_OTHER): Payer: Medicare Other | Admitting: *Deleted

## 2016-06-08 DIAGNOSIS — R002 Palpitations: Secondary | ICD-10-CM | POA: Diagnosis not present

## 2016-06-10 ENCOUNTER — Other Ambulatory Visit: Payer: Self-pay | Admitting: Internal Medicine

## 2016-06-10 NOTE — Progress Notes (Signed)
Carelink Summary Report / Loop Recorder 

## 2016-06-13 LAB — CUP PACEART REMOTE DEVICE CHECK
Date Time Interrogation Session: 20180606182026
Implantable Pulse Generator Implant Date: 20160616

## 2016-06-13 NOTE — Progress Notes (Signed)
Carelink summary report received. Battery status OK. Normal device function. No new brady or pause episodes. No new AF episodes. 1 sx. ECG appears noise/artifact, occ PVC noted. 11 tachy- ECGs appear SR/ST w/ noise artifact. Monthly summary reports and ROV/PRN

## 2016-06-28 ENCOUNTER — Telehealth: Payer: Self-pay | Admitting: *Deleted

## 2016-06-28 NOTE — Telephone Encounter (Signed)
Spoke with patient's daughter to request a manual Carelink transmission for review.  Advised that we received alert for "brady" episodes for review, but only one ECG available.  Available ECG is false, SR w/undersensing.  Patient's daughter appreciative and requests that I contact patient directly as she is out of town.  Spoke with patient and walked her through manual transmission process.  Advised I will call back after transmission is received if any real episodes.  Patient is appreciative and denies additional questions or concerns at this time.  Transmission received. 32 "brady" episodes since 6/23--all false, undersensing.  Patient denies any substantial weight gain/loss, device movement, or trauma to site in recent weeks.  Spoke with tech services, who advised only recommendation is to increase sensitivity, but may not fix undersensing.  Advised patient and daughter that I will review with Dr. Lovena Le for recommendations and call them back.  They are appreciative and deny additional questions or concerns at this time.

## 2016-07-04 ENCOUNTER — Other Ambulatory Visit: Payer: Self-pay | Admitting: Pulmonary Disease

## 2016-07-04 NOTE — Telephone Encounter (Signed)
Pt is requesting Rx for Bactrim 40ml #3. Prescribed on 01/13/16 and last refilled on 05/23/16. Pt's last OV was 04/19/16 and was instructed to f/u in 79mo. Recall was placed, no appt was scheduled.  PM please advise on refill. Thanks.

## 2016-07-07 ENCOUNTER — Other Ambulatory Visit: Payer: Self-pay | Admitting: Internal Medicine

## 2016-07-08 ENCOUNTER — Ambulatory Visit (INDEPENDENT_AMBULATORY_CARE_PROVIDER_SITE_OTHER): Payer: Medicare Other | Admitting: *Deleted

## 2016-07-08 DIAGNOSIS — R002 Palpitations: Secondary | ICD-10-CM | POA: Diagnosis not present

## 2016-07-11 NOTE — Progress Notes (Signed)
Carelink Summary Report / Loop Recorder 

## 2016-07-13 NOTE — Telephone Encounter (Signed)
Spoke with patient to advise that Dr. Lovena Le recommended ILR reprogramming to prevent false episode detection.  Offered appointments on 7/25 and 7/26.  Patient states she will discuss with her daughter and call back.  She is appreciative and denies additional questions or concerns at this time.

## 2016-07-14 LAB — CUP PACEART REMOTE DEVICE CHECK
Date Time Interrogation Session: 20180706184504
Implantable Pulse Generator Implant Date: 20160616

## 2016-07-19 ENCOUNTER — Other Ambulatory Visit: Payer: Self-pay

## 2016-07-19 ENCOUNTER — Encounter: Payer: Self-pay | Admitting: Pulmonary Disease

## 2016-07-19 MED ORDER — SULFAMETHOXAZOLE-TRIMETHOPRIM 200-40 MG/5ML PO SUSP: 5.0000 mL | ORAL | 3 refills | Status: DC

## 2016-07-19 NOTE — Telephone Encounter (Signed)
Spoke with patient to attempt to schedule Device [facility] appointment.  She will call her daughter today to have her schedule appointment.  Gave direct number at [facility] office for return call.

## 2016-07-19 NOTE — Telephone Encounter (Signed)
Patient's daughter, [name] [facility]), returned call.  Scheduled for 08/03/16 at 12:00pm in the [facility].  She is appreciative and denies additional questions or concerns at this time.

## 2016-07-20 ENCOUNTER — Other Ambulatory Visit: Payer: Self-pay | Admitting: Internal Medicine

## 2016-07-20 MED ORDER — FLECAINIDE ACETATE 50 MG PO TABS: 50.0000 mg | ORAL_TABLET | Freq: Every day | ORAL | 8 refills | Status: DC | PRN

## 2016-07-23 NOTE — Telephone Encounter (Signed)
Ok to refill  Marshell Garfinkel MD [facility] 07/23/2016, 6:35 AM

## 2016-07-25 ENCOUNTER — Other Ambulatory Visit: Payer: Self-pay

## 2016-07-25 MED ORDER — SULFAMETHOXAZOLE-TRIMETHOPRIM 200-40 MG/5ML PO SUSP: 5.0000 mL | ORAL | 3 refills | Status: DC

## 2016-07-25 NOTE — Telephone Encounter (Signed)
rx sent to preferred pharmacy.  Nothing further needed.  

## 2016-08-03 ENCOUNTER — Ambulatory Visit (INDEPENDENT_AMBULATORY_CARE_PROVIDER_SITE_OTHER): Payer: Self-pay | Admitting: *Deleted

## 2016-08-03 ENCOUNTER — Other Ambulatory Visit: Payer: Self-pay | Admitting: Internal Medicine

## 2016-08-03 ENCOUNTER — Other Ambulatory Visit: Payer: Self-pay

## 2016-08-03 ENCOUNTER — Other Ambulatory Visit (INDEPENDENT_AMBULATORY_CARE_PROVIDER_SITE_OTHER): Payer: Medicare Other

## 2016-08-03 ENCOUNTER — Encounter: Payer: Self-pay | Admitting: Internal Medicine

## 2016-08-03 DIAGNOSIS — N3001 Acute cystitis with hematuria: Secondary | ICD-10-CM

## 2016-08-03 DIAGNOSIS — R3989 Other symptoms and signs involving the genitourinary system: Secondary | ICD-10-CM

## 2016-08-03 DIAGNOSIS — R002 Palpitations: Secondary | ICD-10-CM

## 2016-08-03 LAB — URINALYSIS, ROUTINE W REFLEX MICROSCOPIC
Bilirubin Urine: NEGATIVE
Ketones, ur: NEGATIVE
Nitrite: NEGATIVE
Specific Gravity, Urine: 1.01 (ref 1.000–1.030)
Total Protein, Urine: NEGATIVE
Urine Glucose: NEGATIVE
Urobilinogen, UA: 0.2 (ref 0.0–1.0)
pH: 6 (ref 5.0–8.0)

## 2016-08-03 LAB — CUP PACEART INCLINIC DEVICE CHECK
Date Time Interrogation Session: 20180801134120
Implantable Pulse Generator Implant Date: 20160616

## 2016-08-03 MED ORDER — NITROFURANTOIN MONOHYD MACRO 100 MG PO CAPS: 100.0000 mg | ORAL_CAPSULE | Freq: Two times a day (BID) | ORAL | 0 refills | Status: DC

## 2016-08-03 NOTE — Progress Notes (Signed)
Loop check in clinic for reprogramming. Battery status: good. R-waves 0.28mV. 1 symptom episode--previously reviewed (from 03/13/16). 102 tachy episodes--brief ST, EMI, and noise artifact per available EGMs, tachy duration extended to 48 beats. 4 pause episodes--all available ECGs false, undersensing; reprogrammed pause duration to 4.5sec. 52 brady episodes--all false, turned brady detection off. 2 AF episodes (0.2% burden)--appropriate, longest 5hrs, +Eliquis and flecainide; reprogrammed AF recording threshold to only longest episode. Decreased sensitivity to 0.030mV due to frequent noise artifact. Monthly summary reports and ROV with GT in 04/2017.

## 2016-08-08 ENCOUNTER — Ambulatory Visit (INDEPENDENT_AMBULATORY_CARE_PROVIDER_SITE_OTHER): Payer: Medicare Other | Admitting: *Deleted

## 2016-08-08 DIAGNOSIS — R002 Palpitations: Secondary | ICD-10-CM

## 2016-08-09 NOTE — Progress Notes (Signed)
Carelink Summary Report / Loop Recorder 

## 2016-08-10 ENCOUNTER — Encounter: Payer: Self-pay | Admitting: Internal Medicine

## 2016-08-11 ENCOUNTER — Other Ambulatory Visit: Payer: Self-pay | Admitting: Internal Medicine

## 2016-08-11 DIAGNOSIS — N3001 Acute cystitis with hematuria: Secondary | ICD-10-CM

## 2016-08-11 NOTE — Telephone Encounter (Signed)
Pt called back regarding this, she would like a call back

## 2016-08-11 NOTE — Telephone Encounter (Signed)
Pt called back checking on this. She did not know about the MyChart message. I told her to come have the lab done. She said that she would have someone bring her tomorrow.

## 2016-08-12 ENCOUNTER — Other Ambulatory Visit: Payer: Medicare Other

## 2016-08-12 ENCOUNTER — Ambulatory Visit: Payer: Medicare Other | Admitting: Family Medicine

## 2016-08-12 DIAGNOSIS — N3001 Acute cystitis with hematuria: Secondary | ICD-10-CM

## 2016-08-12 NOTE — Telephone Encounter (Signed)
Per Dr. Ronnald Ramp response to the daughter on yesterday he stated to bring her in to have a urine culture done. He has already place order in the sxs. She must have urine check before he is prescribing anything!

## 2016-08-12 NOTE — Telephone Encounter (Signed)
Pt called again about this.  She is still in pain.  Ronnald Ramp Is out can this go to someone else and have a med called in for her today?

## 2016-08-13 DIAGNOSIS — M7989 Other specified soft tissue disorders: Secondary | ICD-10-CM | POA: Diagnosis not present

## 2016-08-13 DIAGNOSIS — M79672 Pain in left foot: Secondary | ICD-10-CM | POA: Diagnosis not present

## 2016-08-15 ENCOUNTER — Other Ambulatory Visit: Payer: Self-pay | Admitting: Internal Medicine

## 2016-08-15 ENCOUNTER — Encounter: Payer: Self-pay | Admitting: Internal Medicine

## 2016-08-15 DIAGNOSIS — M79672 Pain in left foot: Secondary | ICD-10-CM | POA: Diagnosis not present

## 2016-08-15 DIAGNOSIS — M11272 Other chondrocalcinosis, left ankle and foot: Secondary | ICD-10-CM | POA: Diagnosis not present

## 2016-08-15 DIAGNOSIS — M7989 Other specified soft tissue disorders: Secondary | ICD-10-CM | POA: Diagnosis not present

## 2016-08-15 LAB — CULTURE, URINE COMPREHENSIVE

## 2016-08-16 ENCOUNTER — Encounter: Payer: Self-pay | Admitting: Internal Medicine

## 2016-08-16 ENCOUNTER — Other Ambulatory Visit (INDEPENDENT_AMBULATORY_CARE_PROVIDER_SITE_OTHER): Payer: Medicare Other

## 2016-08-16 ENCOUNTER — Ambulatory Visit (INDEPENDENT_AMBULATORY_CARE_PROVIDER_SITE_OTHER): Payer: Medicare Other | Admitting: Internal Medicine

## 2016-08-16 VITALS — BP 110/70 | HR 70 | Temp 97.8°F | Resp 16 | Ht 64.0 in | Wt 102.0 lb

## 2016-08-16 DIAGNOSIS — I10 Essential (primary) hypertension: Secondary | ICD-10-CM | POA: Diagnosis not present

## 2016-08-16 DIAGNOSIS — N3001 Acute cystitis with hematuria: Secondary | ICD-10-CM

## 2016-08-16 LAB — CBC WITH DIFFERENTIAL/PLATELET
Basophils Absolute: 0 10*3/uL (ref 0.0–0.1)
Basophils Relative: 0.2 % (ref 0.0–3.0)
Eosinophils Absolute: 0 10*3/uL (ref 0.0–0.7)
Eosinophils Relative: 0 % (ref 0.0–5.0)
HCT: 37.7 % (ref 36.0–46.0)
Hemoglobin: 12.1 g/dL (ref 12.0–15.0)
Lymphocytes Relative: 8 % — ABNORMAL LOW (ref 12.0–46.0)
Lymphs Abs: 1.2 10*3/uL (ref 0.7–4.0)
MCHC: 32.2 g/dL (ref 30.0–36.0)
MCV: 95.9 fl (ref 78.0–100.0)
Monocytes Absolute: 1.2 10*3/uL — ABNORMAL HIGH (ref 0.1–1.0)
Monocytes Relative: 8 % (ref 3.0–12.0)
Neutro Abs: 12.3 10*3/uL — ABNORMAL HIGH (ref 1.4–7.7)
Neutrophils Relative %: 83.8 % — ABNORMAL HIGH (ref 43.0–77.0)
Platelets: 332 10*3/uL (ref 150.0–400.0)
RBC: 3.93 Mil/uL (ref 3.87–5.11)
RDW: 13.5 % (ref 11.5–15.5)
WBC: 14.7 10*3/uL — ABNORMAL HIGH (ref 4.0–10.5)

## 2016-08-16 LAB — COMPREHENSIVE METABOLIC PANEL
ALT: 17 U/L (ref 0–35)
AST: 21 U/L (ref 0–37)
Albumin: 3.4 g/dL — ABNORMAL LOW (ref 3.5–5.2)
Alkaline Phosphatase: 71 U/L (ref 39–117)
BUN: 24 mg/dL — ABNORMAL HIGH (ref 6–23)
CO2: 30 mEq/L (ref 19–32)
Calcium: 9.2 mg/dL (ref 8.4–10.5)
Chloride: 106 mEq/L (ref 96–112)
Creatinine, Ser: 0.71 mg/dL (ref 0.40–1.20)
GFR: 82.73 mL/min (ref >60.00)
Glucose, Bld: 81 mg/dL (ref 70–99)
Potassium: 4.7 mEq/L (ref 3.5–5.1)
Sodium: 141 mEq/L (ref 135–145)
Total Bilirubin: 0.4 mg/dL (ref 0.2–1.2)
Total Protein: 6.5 g/dL (ref 6.0–8.3)

## 2016-08-16 LAB — URINALYSIS, ROUTINE W REFLEX MICROSCOPIC
Bilirubin Urine: NEGATIVE
Nitrite: POSITIVE — AB
Specific Gravity, Urine: 1.015 (ref 1.000–1.030)
Total Protein, Urine: 30 — AB
Urine Glucose: NEGATIVE
Urobilinogen, UA: 0.2 (ref 0.0–1.0)
pH: 6 (ref 5.0–8.0)

## 2016-08-16 NOTE — Patient Instructions (Signed)

## 2016-08-16 NOTE — Progress Notes (Signed)
Subjective:  Patient ID: Julie Silva, female    DOB: Feb 17, 1927  Age: 83 y.o. MRN: [id number]  CC: Urinary Tract Infection   HPI Julie Silva presents for f/up on UTI. She has been taking the nitrofurantoin for several days and says her dysuria and bladder pain is better but still present. She's had a good appetite and denies nausea, vomiting, fever, or chills.  Outpatient Medications Prior to Visit  Medication Sig Dispense Refill  . bimatoprost (LUMIGAN) 0.01 % SOLN Place 1 drop into both eyes at bedtime.    . Calcium Carbonate (CALTRATE 600 PO) Take 2 tablets by mouth daily.    . dorzolamide-timolol (COSOPT) 22.3-6.8 MG/ML ophthalmic solution Place 1 drop into both eyes 2 (two) times daily.     Marland Kitchen ELIQUIS 2.5 MG TABS tablet TAKE 1 TABLET BY MOUTH TWICE A DAY 60 tablet 5  . mirtazapine (REMERON) 15 MG tablet TAKE ONE TABLET BY MOUTH EVERY NIGHT AT BEDTIME 30 tablet 11  . Multiple Vitamins-Minerals (PRESERVISION AREDS) CAPS Take 1 capsule by mouth 2 (two) times daily.    . ranitidine (ZANTAC) 300 MG tablet TAKE 1 TABLET BY MOUTH DAILY 90 tablet 3  . flecainide (TAMBOCOR) 50 MG tablet Take 1 tablet (50 mg total) by mouth daily as needed. Take as directed (Patient not taking: Reported on 08/16/2016) 30 tablet 8  . nitrofurantoin, macrocrystal-monohydrate, (MACROBID) 100 MG capsule TAKE 1 CAPSULE BY MOUTH TWICE A DAY 10 capsule 0  . Respiratory Therapy Supplies (FLUTTER) DEVI Use as directed. 1 each 0  . sulfamethoxazole-trimethoprim (BACTRIM,SEPTRA) 200-40 MG/5ML suspension Take 5 mLs by mouth 3 (three) times a week. (Patient not taking: Reported on 08/16/2016) 100 mL 3   No facility-administered medications prior to visit.     ROS Review of Systems  Constitutional: Negative for chills, diaphoresis, fatigue and fever.  HENT: Negative.   Eyes: Negative for visual disturbance.  Respiratory: Negative.  Negative for cough, chest tightness, shortness of breath and wheezing.     Cardiovascular: Negative for chest pain, palpitations and leg swelling.  Gastrointestinal: Negative for abdominal pain, constipation, diarrhea, nausea and vomiting.  Endocrine: Negative.   Genitourinary: Positive for dysuria. Negative for decreased urine volume, difficulty urinating, flank pain, frequency, hematuria, urgency and vaginal bleeding.  Musculoskeletal: Negative.   Skin: Negative.   Allergic/Immunologic: Negative.   Neurological: Negative.   Hematological: Negative for adenopathy. Does not bruise/bleed easily.  Psychiatric/Behavioral: Negative.     Objective:  BP 110/70 (BP Location: Left Arm, Patient Position: Sitting, Cuff Size: Normal)   Pulse 70   Temp 97.8 F (36.6 C) (Oral)   Resp 16   Ht 5\' 4"  (1.626 m)   Wt 102 lb (46.3 kg)   SpO2 98%   BMI 17.51 kg/m   BP Readings from Last 3 Encounters:  08/16/16 110/70  04/19/16 120/64  04/05/16 124/62    Wt Readings from Last 3 Encounters:  08/16/16 102 lb (46.3 kg)  04/19/16 97 lb 3.2 oz (44.1 kg)  04/05/16 96 lb 12.8 oz (43.9 kg)    Physical Exam  Constitutional: She is oriented to person, place, and time. No distress.  HENT:  Mouth/Throat: Oropharynx is clear and moist. No oropharyngeal exudate.  Eyes: Conjunctivae are normal. Right eye exhibits no discharge. Left eye exhibits no discharge. No scleral icterus.  Neck: Normal range of motion. Neck supple. No JVD present. No thyromegaly present.  Cardiovascular: Normal rate, regular rhythm and intact distal pulses.  Exam reveals no gallop and no  friction rub.   No murmur heard. Pulmonary/Chest: Effort normal and breath sounds normal. No respiratory distress. She has no wheezes. She has no rales. She exhibits no tenderness.  Abdominal: Soft. Bowel sounds are normal. She exhibits no distension and no mass. There is no tenderness. There is no rebound and no guarding.  Musculoskeletal: Normal range of motion. She exhibits no edema, tenderness or deformity.   Lymphadenopathy:    She has no cervical adenopathy.  Neurological: She is alert and oriented to person, place, and time.  Skin: Skin is warm and dry. No rash noted. She is not diaphoretic. No erythema. No pallor.  Vitals reviewed.   Lab Results  Component Value Date   WBC 14.7 (H) 08/16/2016   HGB 12.1 08/16/2016   HCT 37.7 08/16/2016   PLT 332.0 08/16/2016   GLUCOSE 81 08/16/2016   CHOL 168 04/04/2016   TRIG 105.0 04/04/2016   HDL 58.10 04/04/2016   LDLCALC 89 04/04/2016   ALT 17 08/16/2016   AST 21 08/16/2016   NA 141 08/16/2016   K 4.7 08/16/2016   CL 106 08/16/2016   CREATININE 0.71 08/16/2016   BUN 24 (H) 08/16/2016   CO2 30 08/16/2016   TSH 3.69 04/04/2016   INR 0.9 07/03/2012   HGBA1C 5.5 05/07/2013    Dg Chest 2 View  Result Date: 04/04/2016 CLINICAL DATA:  Dyspnea and right flank pain for 7 days. EXAM: CHEST  2 VIEW COMPARISON:  April 29, 2015 FINDINGS: The heart size and mediastinal contours are within normal limits. There are stable micro nodular densities in bilateral mid to lung base unchanged compared to chest x-ray of April 29, 2015. There is no focal pneumonia, pulmonary edema, or pleural effusion. The visualized skeletal structures are unremarkable. IMPRESSION: No active cardiopulmonary disease. Stable Michael nodular densities in bilateral mid to lung bases unchanged compared to prior chest x-ray of April 29, 2015. Electronically Signed   By: Abelardo Diesel M.D.   On: 04/04/2016 15:01    Assessment & Plan:   Julie Silva was seen today for urinary tract infection.  Diagnoses and all orders for this visit:  Acute cystitis with hematuria- I will recheck her UA and urine culture to see if this has resolved. She has a mildly elevated white cell count so I think she should be treated for bacterial infection. -     Urinalysis, Routine w reflex microscopic; Future -     CULTURE, URINE COMPREHENSIVE; Future  Essential hypertension, benign- her blood pressure is  adequately well controlled. Electrolytes and renal function are normal. -     CBC with Differential/Platelet; Future -     Comprehensive metabolic panel; Future -     Urinalysis, Routine w reflex microscopic; Future   I have discontinued Julie Silva FLUTTER, sulfamethoxazole-trimethoprim, and nitrofurantoin (macrocrystal-monohydrate). I am also having her maintain her PRESERVISION AREDS, bimatoprost, dorzolamide-timolol, Calcium Carbonate (CALTRATE 600 PO), ELIQUIS, ranitidine, mirtazapine, and flecainide.  Meds ordered this encounter  Medications  . DISCONTD: nitrofurantoin, macrocrystal-monohydrate, (MACROBID) 100 MG capsule     Follow-up: Return if symptoms worsen or fail to improve.  Scarlette Calico, MD

## 2016-08-17 LAB — CUP PACEART REMOTE DEVICE CHECK
Date Time Interrogation Session: 20180805194007
Implantable Pulse Generator Implant Date: 20160616

## 2016-08-18 ENCOUNTER — Encounter: Payer: Self-pay | Admitting: Internal Medicine

## 2016-08-18 ENCOUNTER — Other Ambulatory Visit: Payer: Self-pay | Admitting: Internal Medicine

## 2016-08-18 DIAGNOSIS — N3001 Acute cystitis with hematuria: Secondary | ICD-10-CM

## 2016-08-18 LAB — CULTURE, URINE COMPREHENSIVE

## 2016-08-18 MED ORDER — CEFPODOXIME PROXETIL 200 MG PO TABS: 200.0000 mg | ORAL_TABLET | Freq: Two times a day (BID) | ORAL | 0 refills | Status: AC

## 2016-08-19 ENCOUNTER — Encounter: Payer: Self-pay | Admitting: Internal Medicine

## 2016-08-23 ENCOUNTER — Telehealth: Payer: Self-pay | Admitting: *Deleted

## 2016-08-23 NOTE — Telephone Encounter (Signed)
Manual transmission received, updated programming successfully synced with network.  3 "pause" episodes are false--undersensing.  Available tachy episode ECGs continue to show ST vs SVT and artifact.

## 2016-08-23 NOTE — Telephone Encounter (Signed)
Spoke with patient to request manual transmission from [facility] monitor.  Advised that after reprogramming of loop recorder a few weeks ago, we need to sync the new programming with the Carelink network.  Patient states she will send a transmission today.  Advised I will call her back if it is not received and she is agreeable to this plan.

## 2016-08-29 DIAGNOSIS — H401132 Primary open-angle glaucoma, bilateral, moderate stage: Secondary | ICD-10-CM | POA: Diagnosis not present

## 2016-09-06 ENCOUNTER — Ambulatory Visit (INDEPENDENT_AMBULATORY_CARE_PROVIDER_SITE_OTHER): Payer: Medicare Other | Admitting: *Deleted

## 2016-09-06 DIAGNOSIS — R002 Palpitations: Secondary | ICD-10-CM | POA: Diagnosis not present

## 2016-09-06 DIAGNOSIS — Z85828 Personal history of other malignant neoplasm of skin: Secondary | ICD-10-CM | POA: Diagnosis not present

## 2016-09-07 ENCOUNTER — Encounter: Payer: Self-pay | Admitting: Internal Medicine

## 2016-09-07 NOTE — Progress Notes (Signed)
Carelink Summary Report / Loop Recorder 

## 2016-09-08 ENCOUNTER — Other Ambulatory Visit: Payer: Self-pay | Admitting: Internal Medicine

## 2016-09-08 DIAGNOSIS — N3001 Acute cystitis with hematuria: Secondary | ICD-10-CM

## 2016-09-09 ENCOUNTER — Other Ambulatory Visit (INDEPENDENT_AMBULATORY_CARE_PROVIDER_SITE_OTHER): Payer: Medicare Other

## 2016-09-09 DIAGNOSIS — N3001 Acute cystitis with hematuria: Secondary | ICD-10-CM | POA: Diagnosis not present

## 2016-09-09 LAB — URINALYSIS, ROUTINE W REFLEX MICROSCOPIC
Bilirubin Urine: NEGATIVE
Ketones, ur: NEGATIVE
Nitrite: POSITIVE — AB
RBC / HPF: NONE SEEN (ref 0–?)
Specific Gravity, Urine: <=1.005 — AB (ref 1.000–1.030)
Total Protein, Urine: NEGATIVE
Urine Glucose: NEGATIVE
Urobilinogen, UA: 0.2 (ref 0.0–1.0)
pH: 6.5 (ref 5.0–8.0)

## 2016-09-11 LAB — URINE CULTURE
MICRO NUMBER:: 80985855
SPECIMEN QUALITY:: ADEQUATE

## 2016-09-12 LAB — CUP PACEART REMOTE DEVICE CHECK
Date Time Interrogation Session: 20180904201114
Implantable Pulse Generator Implant Date: 20160616

## 2016-09-13 ENCOUNTER — Encounter: Payer: Self-pay | Admitting: Internal Medicine

## 2016-10-06 ENCOUNTER — Ambulatory Visit (INDEPENDENT_AMBULATORY_CARE_PROVIDER_SITE_OTHER): Payer: Medicare Other | Admitting: *Deleted

## 2016-10-06 DIAGNOSIS — R002 Palpitations: Secondary | ICD-10-CM | POA: Diagnosis not present

## 2016-10-07 ENCOUNTER — Other Ambulatory Visit: Payer: Self-pay | Admitting: *Deleted

## 2016-10-07 LAB — CUP PACEART REMOTE DEVICE CHECK
Date Time Interrogation Session: 20181004201000
Implantable Pulse Generator Implant Date: 20160616

## 2016-10-07 MED ORDER — APIXABAN 2.5 MG PO TABS: 2.5000 mg | ORAL_TABLET | Freq: Two times a day (BID) | ORAL | 5 refills | Status: DC

## 2016-10-07 NOTE — Telephone Encounter (Signed)
Pharmacy requesting a ninety day rx as this is cheaper for the patient.

## 2016-10-07 NOTE — Telephone Encounter (Signed)
Pt last saw Dr Lovena Le 04/05/16, last labs Creat 0.71 on 08/16/16, age 83, weight 46.3kg, based on specified criteria pt is on appropriate dosage of Eliquis 2.5mg  BID.  Will refill rx.

## 2016-10-07 NOTE — Progress Notes (Signed)
Loop recorder summary report 

## 2016-10-11 ENCOUNTER — Ambulatory Visit (INDEPENDENT_AMBULATORY_CARE_PROVIDER_SITE_OTHER): Payer: Medicare Other

## 2016-10-11 DIAGNOSIS — Z23 Encounter for immunization: Secondary | ICD-10-CM

## 2016-10-31 MED ORDER — MIRTAZAPINE 15 MG PO TABS: 15.0000 mg | ORAL_TABLET | Freq: Every day | ORAL | 3 refills | Status: DC

## 2016-11-07 ENCOUNTER — Ambulatory Visit (INDEPENDENT_AMBULATORY_CARE_PROVIDER_SITE_OTHER): Payer: Medicare Other | Admitting: *Deleted

## 2016-11-07 DIAGNOSIS — R002 Palpitations: Secondary | ICD-10-CM

## 2016-11-07 NOTE — Progress Notes (Signed)
Carelink Summary Report / Loop Recorder 

## 2016-11-09 ENCOUNTER — Telehealth: Payer: Self-pay | Admitting: Cardiology

## 2016-11-09 NOTE — Telephone Encounter (Signed)
Transmission received.  No recent episodes noted.  Advised patient's daughter, Julie Silva, that no tachy episodes >115bpm for >/=48bts were noted.  Encouraged symptom activator use.  Patient had gotten up to use the restroom and felt the palpitations/fast HR, then returned to her bedroom but the symptoms had already resolved.  No dizziness, chest discomfort, ShOB, or other cardiac symptoms.  Educated patient's daughter that symptom activator "looks" 6.77min back and 30sec forward from time of use so we may have been able to see episode.  Patient's daughter verbalizes understanding and states she will discuss use with patient.  She is appreciative and denies additional questions or concerns at this time.

## 2016-11-09 NOTE — Telephone Encounter (Signed)
Patient daughter called and stated that pt experienced a rapid and hard heart beat around 9:00 AM. Instructed pt daughter to send a manual transmission w/ pt home monitor and a Device Tech RN will review and call back w/ results. Pt daughter verbalized understanding.

## 2016-11-10 LAB — CUP PACEART REMOTE DEVICE CHECK
Date Time Interrogation Session: 20181103204644
Implantable Pulse Generator Implant Date: 20160616

## 2016-12-05 ENCOUNTER — Ambulatory Visit (INDEPENDENT_AMBULATORY_CARE_PROVIDER_SITE_OTHER): Payer: Medicare Other | Admitting: *Deleted

## 2016-12-05 DIAGNOSIS — R002 Palpitations: Secondary | ICD-10-CM | POA: Diagnosis not present

## 2016-12-06 NOTE — Progress Notes (Signed)
Carelink Summary Report / Loop Recorder 

## 2016-12-13 LAB — CUP PACEART REMOTE DEVICE CHECK
Date Time Interrogation Session: 20181203213920
Implantable Pulse Generator Implant Date: 20160616

## 2016-12-21 ENCOUNTER — Other Ambulatory Visit: Payer: Self-pay | Admitting: Internal Medicine

## 2016-12-28 ENCOUNTER — Other Ambulatory Visit: Payer: Self-pay | Admitting: Internal Medicine

## 2016-12-28 ENCOUNTER — Other Ambulatory Visit (INDEPENDENT_AMBULATORY_CARE_PROVIDER_SITE_OTHER): Payer: Medicare Other

## 2016-12-28 ENCOUNTER — Telehealth: Payer: Self-pay | Admitting: Internal Medicine

## 2016-12-28 DIAGNOSIS — N3001 Acute cystitis with hematuria: Secondary | ICD-10-CM | POA: Diagnosis not present

## 2016-12-28 LAB — URINALYSIS, ROUTINE W REFLEX MICROSCOPIC
Bilirubin Urine: NEGATIVE
Ketones, ur: NEGATIVE
Leukocytes, UA: NEGATIVE
Nitrite: NEGATIVE
Specific Gravity, Urine: 1.015 (ref 1.000–1.030)
Total Protein, Urine: NEGATIVE
Urine Glucose: NEGATIVE
Urobilinogen, UA: 0.2 (ref 0.0–1.0)
pH: 6 (ref 5.0–8.0)

## 2016-12-28 NOTE — Telephone Encounter (Signed)
Notified pt/daughter w/MD response../lmb 

## 2016-12-28 NOTE — Telephone Encounter (Signed)
UA and clx ordered

## 2016-12-28 NOTE — Telephone Encounter (Signed)
Copied from [place] [id number]. Topic: Inquiry >> Dec 28, 2016  9:55 AM Scherrie Gerlach wrote: Reason for CRM: daughter [name] calling to advise pt is having UA pressure, burning with urination, slight odor. Pt does have to in and out cath everyday, so pt susceptible to urinary tract infections. Pt is having the same symptoms as she did the last time she had this, Daughter [name] states everything else seems to be fine, and doesn't think she needs to be seen unless the dr thinks so.  Pt would like to come in and give a urine sample.

## 2016-12-29 ENCOUNTER — Telehealth: Payer: Self-pay | Admitting: Internal Medicine

## 2016-12-29 LAB — CULTURE, URINE COMPREHENSIVE
MICRO NUMBER:: 81448251
SPECIMEN QUALITY:: ADEQUATE

## 2016-12-29 NOTE — Telephone Encounter (Signed)
Pat notified of UA result- still awaiting the culture results. Will notify if positive culture.

## 2016-12-29 NOTE — Telephone Encounter (Signed)
Copied from [place]. Topic: Quick Communication - See Telephone Encounter >> Dec 29, 2016 10:01 AM Boyd Kerbs wrote: CRM for notification. See Telephone encounter for:   Daughter, [name] was calling regarding results for UTI Urine test  12/29/16.

## 2016-12-30 ENCOUNTER — Encounter: Payer: Self-pay | Admitting: Internal Medicine

## 2017-01-04 ENCOUNTER — Ambulatory Visit (INDEPENDENT_AMBULATORY_CARE_PROVIDER_SITE_OTHER): Payer: Medicare Other | Admitting: *Deleted

## 2017-01-04 DIAGNOSIS — R002 Palpitations: Secondary | ICD-10-CM

## 2017-01-04 DIAGNOSIS — N952 Postmenopausal atrophic vaginitis: Secondary | ICD-10-CM | POA: Diagnosis not present

## 2017-01-04 DIAGNOSIS — N898 Other specified noninflammatory disorders of vagina: Secondary | ICD-10-CM | POA: Diagnosis not present

## 2017-01-05 NOTE — Progress Notes (Signed)
Carelink Summary Report / Loop Recorder 

## 2017-01-17 LAB — CUP PACEART REMOTE DEVICE CHECK
Date Time Interrogation Session: 20190102230908
Implantable Pulse Generator Implant Date: 20160616

## 2017-02-02 ENCOUNTER — Other Ambulatory Visit: Payer: Self-pay | Admitting: Internal Medicine

## 2017-02-02 ENCOUNTER — Encounter: Payer: Self-pay | Admitting: Internal Medicine

## 2017-02-02 DIAGNOSIS — M1000 Idiopathic gout, unspecified site: Secondary | ICD-10-CM | POA: Insufficient documentation

## 2017-02-02 MED ORDER — METHYLPREDNISOLONE 4 MG PO TBPK: ORAL_TABLET | ORAL | 0 refills | Status: DC

## 2017-02-02 MED ORDER — COLCHICINE 0.6 MG PO CAPS: 1.0000 | ORAL_CAPSULE | Freq: Two times a day (BID) | ORAL | 2 refills | Status: DC

## 2017-02-03 ENCOUNTER — Telehealth: Payer: Self-pay | Admitting: Cardiology

## 2017-02-03 ENCOUNTER — Ambulatory Visit (INDEPENDENT_AMBULATORY_CARE_PROVIDER_SITE_OTHER): Payer: Medicare Other | Admitting: *Deleted

## 2017-02-03 DIAGNOSIS — R002 Palpitations: Secondary | ICD-10-CM | POA: Diagnosis not present

## 2017-02-03 NOTE — Telephone Encounter (Signed)
Patient daughter called and stated that patient had an episode this morning and they sent a manual transmission and they wanted to see if their was any episodes recorded. Call routed to [place].

## 2017-02-03 NOTE — Telephone Encounter (Signed)
Reviewed remote transmission with patients daughter. Patient reported symptoms of a fast heart rate this morning. I reviewed most recent episodes which showed no episodes recorded for today. Patients daughter verbalized understanding. Symptom activator use reviewed.

## 2017-02-06 ENCOUNTER — Ambulatory Visit (INDEPENDENT_AMBULATORY_CARE_PROVIDER_SITE_OTHER): Payer: Medicare Other | Admitting: Internal Medicine

## 2017-02-06 ENCOUNTER — Ambulatory Visit (INDEPENDENT_AMBULATORY_CARE_PROVIDER_SITE_OTHER)
Admission: RE | Admit: 2017-02-06 | Discharge: 2017-02-06 | Disposition: A | Discharge status: Home / Self Care | Payer: Medicare Other | Source: Ambulatory Visit | Attending: Internal Medicine | Admitting: Internal Medicine

## 2017-02-06 ENCOUNTER — Encounter: Payer: Self-pay | Admitting: Internal Medicine

## 2017-02-06 VITALS — BP 142/60 | HR 68 | Temp 97.6°F | Resp 16 | Ht 64.0 in | Wt 108.5 lb

## 2017-02-06 DIAGNOSIS — R1013 Epigastric pain: Secondary | ICD-10-CM | POA: Insufficient documentation

## 2017-02-06 DIAGNOSIS — K2100 Gastro-esophageal reflux disease with esophagitis, without bleeding: Secondary | ICD-10-CM

## 2017-02-06 MED ORDER — PROMETHAZINE HCL 12.5 MG PO TABS: 12.5000 mg | ORAL_TABLET | Freq: Four times a day (QID) | ORAL | 0 refills | Status: DC | PRN

## 2017-02-06 MED ORDER — OMEPRAZOLE 40 MG PO CPDR: 40.0000 mg | DELAYED_RELEASE_CAPSULE | Freq: Every day | ORAL | 1 refills | Status: DC

## 2017-02-06 NOTE — Progress Notes (Signed)
Carelink Summary Report / Loop Recorder 

## 2017-02-06 NOTE — Patient Instructions (Signed)
Gastroesophageal Reflux Disease, Adult Normally, food travels down the esophagus and stays in the stomach to be digested. However, when a person has gastroesophageal reflux disease (GERD), food and stomach acid move back up into the esophagus. When this happens, the esophagus becomes sore and inflamed. Over time, GERD can create small holes (ulcers) in the lining of the esophagus. What are the causes? This condition is caused by a problem with the muscle between the esophagus and the stomach (lower esophageal sphincter, or LES). Normally, the LES muscle closes after food passes through the esophagus to the stomach. When the LES is weakened or abnormal, it does not close properly, and that allows food and stomach acid to go back up into the esophagus. The LES can be weakened by certain dietary substances, medicines, and medical conditions, including:  Tobacco use.  Pregnancy.  Having a hiatal hernia.  Heavy alcohol use.  Certain foods and beverages, such as coffee, chocolate, onions, and peppermint.  What increases the risk? This condition is more likely to develop in:  People who have an increased body weight.  People who have connective tissue disorders.  People who use NSAID medicines.  What are the signs or symptoms? Symptoms of this condition include:  Heartburn.  Difficult or painful swallowing.  The feeling of having a lump in the throat.  Abitter taste in the mouth.  Bad breath.  Having a large amount of saliva.  Having an upset or bloated stomach.  Belching.  Chest pain.  Shortness of breath or wheezing.  Ongoing (chronic) cough or a night-time cough.  Wearing away of tooth enamel.  Weight loss.  Different conditions can cause chest pain. Make sure to see your health care provider if you experience chest pain. How is this diagnosed? Your health care provider will take a medical history and perform a physical exam. To determine if you have mild or severe  GERD, your health care provider may also monitor how you respond to treatment. You may also have other tests, including:  An endoscopy toexamine your stomach and esophagus with a small camera.  A test thatmeasures the acidity level in your esophagus.  A test thatmeasures how much pressure is on your esophagus.  A barium swallow or modified barium swallow to show the shape, size, and functioning of your esophagus.  How is this treated? The goal of treatment is to help relieve your symptoms and to prevent complications. Treatment for this condition may vary depending on how severe your symptoms are. Your health care provider may recommend:  Changes to your diet.  Medicine.  Surgery.  Follow these instructions at home: Diet  Follow a diet as recommended by your health care provider. This may involve avoiding foods and drinks such as: ? Coffee and tea (with or without caffeine). ? Drinks that containalcohol. ? Energy drinks and sports drinks. ? Carbonated drinks or sodas. ? Chocolate and cocoa. ? Peppermint and mint flavorings. ? Garlic and onions. ? Horseradish. ? Spicy and acidic foods, including peppers, chili powder, curry powder, vinegar, hot sauces, and barbecue sauce. ? Citrus fruit juices and citrus fruits, such as oranges, lemons, and limes. ? Tomato-based foods, such as red sauce, chili, salsa, and pizza with red sauce. ? Fried and fatty foods, such as donuts, french fries, potato chips, and high-fat dressings. ? High-fat meats, such as hot dogs and fatty cuts of red and white meats, such as rib eye steak, sausage, ham, and bacon. ? High-fat dairy items, such as whole milk,   butter, and cream cheese.  Eat small, frequent meals instead of large meals.  Avoid drinking large amounts of liquid with your meals.  Avoid eating meals during the 2-3 hours before bedtime.  Avoid lying down right after you eat.  Do not exercise right after you eat. General  instructions  Pay attention to any changes in your symptoms.  Take over-the-counter and prescription medicines only as told by your health care provider. Do not take aspirin, ibuprofen, or other NSAIDs unless your health care provider told you to do so.  Do not use any tobacco products, including cigarettes, chewing tobacco, and e-cigarettes. If you need help quitting, ask your health care provider.  Wear loose-fitting clothing. Do not wear anything tight around your waist that causes pressure on your abdomen.  Raise (elevate) the head of your bed 6 inches (15cm).  Try to reduce your stress, such as with yoga or meditation. If you need help reducing stress, ask your health care provider.  If you are overweight, reduce your weight to an amount that is healthy for you. Ask your health care provider for guidance about a safe weight loss goal.  Keep all follow-up visits as told by your health care provider. This is important. Contact a health care provider if:  You have new symptoms.  You have unexplained weight loss.  You have difficulty swallowing, or it hurts to swallow.  You have wheezing or a persistent cough.  Your symptoms do not improve with treatment.  You have a hoarse voice. Get help right away if:  You have pain in your arms, neck, jaw, teeth, or back.  You feel sweaty, dizzy, or light-headed.  You have chest pain or shortness of breath.  You vomit and your vomit looks like blood or coffee grounds.  You faint.  Your stool is bloody or black.  You cannot swallow, drink, or eat. This information is not intended to replace advice given to you by your health care provider. Make sure you discuss any questions you have with your health care provider. Document Released: 09/29/2004 Document Revised: 05/20/2015 Document Reviewed: 04/16/2014 Elsevier Interactive Patient Education  2018 Elsevier Inc.  

## 2017-02-07 ENCOUNTER — Telehealth: Payer: Self-pay | Admitting: *Deleted

## 2017-02-07 LAB — CUP PACEART REMOTE DEVICE CHECK
Date Time Interrogation Session: 20190201233908
Implantable Pulse Generator Implant Date: 20160616

## 2017-02-07 NOTE — Progress Notes (Signed)
Subjective:  Patient ID: Julie Silva, female    DOB: 1927-10-28  Age: 84 y.o. MRN: [id number]  CC: Gastroesophageal Reflux   HPI [name] presents for concerns about heartburn, nausea, and retching.  She ate a piece of fudge about 3 days ago and since then has been feeling poorly.  She has a history of GERD and says her heartburn has not recently been controlled by the H2 blocker.  She has mild discomfort in her epigastrium and under her xiphoid process.  She has maintained a normal appetite today and has had coffee and breakfast and did not experience any vomiting.  She denies odynophagia or dysphagia.  Outpatient Medications Prior to Visit  Medication Sig Dispense Refill  . apixaban (ELIQUIS) 2.5 MG TABS tablet Take 1 tablet (2.5 mg total) by mouth 2 (two) times daily. 60 tablet 5  . bimatoprost (LUMIGAN) 0.01 % SOLN Place 1 drop into both eyes at bedtime.    . Calcium Carbonate (CALTRATE 600 PO) Take 2 tablets by mouth daily.    . dorzolamide-timolol (COSOPT) 22.3-6.8 MG/ML ophthalmic solution Place 1 drop into both eyes 2 (two) times daily.     . mirtazapine (REMERON) 15 MG tablet Take 1 tablet (15 mg total) by mouth at bedtime. 90 tablet 3  . Multiple Vitamins-Minerals (PRESERVISION AREDS) CAPS Take 1 capsule by mouth 2 (two) times daily.    . ranitidine (ZANTAC) 300 MG tablet TAKE 1 TABLET BY MOUTH DAILY 90 tablet 3  . Colchicine (MITIGARE) 0.6 MG CAPS Take 1 tablet by mouth 2 (two) times daily. 60 capsule 2  . flecainide (TAMBOCOR) 50 MG tablet Take 1 tablet (50 mg total) by mouth daily as needed. Take as directed (Patient not taking: Reported on 08/16/2016) 30 tablet 8  . methylPREDNISolone (MEDROL DOSEPAK) 4 MG TBPK tablet TAKE AS DIRECTED 21 tablet 0   No facility-administered medications prior to visit.     ROS Review of Systems  Constitutional: Negative.  Negative for diaphoresis, fatigue and fever.  HENT: Negative for sore throat, trouble swallowing and voice change.     Eyes: Negative for visual disturbance.  Respiratory: Negative for cough, choking, chest tightness, shortness of breath and stridor.   Cardiovascular: Negative for chest pain, palpitations and leg swelling.  Gastrointestinal: Positive for abdominal pain, nausea and vomiting. Negative for blood in stool, constipation and diarrhea.  Endocrine: Negative.   Genitourinary: Negative.  Negative for difficulty urinating.  Musculoskeletal: Negative.   Skin: Negative.   Allergic/Immunologic: Negative.   Neurological: Negative.  Negative for dizziness, weakness, light-headedness and numbness.  Hematological: Negative for adenopathy. Does not bruise/bleed easily.  Psychiatric/Behavioral: Negative.     Objective:  BP (!) 142/60 (BP Location: Left Arm, Patient Position: Sitting, Cuff Size: Normal)   Pulse 68   Temp 97.6 F (36.4 C) (Oral)   Resp 16   Ht 5\' 4"  (1.626 m)   Wt 108 lb 8 oz (49.2 kg)   SpO2 98%   BMI 18.62 kg/m   BP Readings from Last 3 Encounters:  02/06/17 (!) 142/60  08/16/16 110/70  04/19/16 120/64    Wt Readings from Last 3 Encounters:  02/06/17 108 lb 8 oz (49.2 kg)  08/16/16 102 lb (46.3 kg)  04/19/16 97 lb 3.2 oz (44.1 kg)    Physical Exam  Constitutional: She is oriented to person, place, and time. No distress.  HENT:  Mouth/Throat: Oropharynx is clear and moist. No oropharyngeal exudate.  Eyes: Conjunctivae are normal. Left eye exhibits  no discharge. No scleral icterus.  Neck: Normal range of motion. Neck supple. No JVD present. No thyromegaly present.  Cardiovascular: Normal rate, regular rhythm and normal heart sounds. Exam reveals no gallop.  No murmur heard. Pulmonary/Chest: Effort normal and breath sounds normal. No respiratory distress. She has no wheezes. She has no rales.  Abdominal: Soft. Bowel sounds are normal. She exhibits no distension and no mass. There is no tenderness. There is no guarding.  Musculoskeletal: Normal range of motion. She exhibits  no edema, tenderness or deformity.  Lymphadenopathy:    She has no cervical adenopathy.  Neurological: She is alert and oriented to person, place, and time.  Skin: Skin is warm and dry. No rash noted. She is not diaphoretic. No erythema. No pallor.  Vitals reviewed.   Lab Results  Component Value Date   WBC 14.7 (H) 08/16/2016   HGB 12.1 08/16/2016   HCT 37.7 08/16/2016   PLT 332.0 08/16/2016   GLUCOSE 81 08/16/2016   CHOL 168 04/04/2016   TRIG 105.0 04/04/2016   HDL 58.10 04/04/2016   LDLCALC 89 04/04/2016   ALT 17 08/16/2016   AST 21 08/16/2016   NA 141 08/16/2016   K 4.7 08/16/2016   CL 106 08/16/2016   CREATININE 0.71 08/16/2016   BUN 24 (H) 08/16/2016   CO2 30 08/16/2016   TSH 3.69 04/04/2016   INR 0.9 07/03/2012   HGBA1C 5.5 05/07/2013    Dg Abd Acute W/chest  Result Date: 02/06/2017 CLINICAL DATA:  Epigastric pain for 2 days, initial encounter EXAM: DG ABDOMEN ACUTE W/ 1V CHEST COMPARISON:  04/04/2016 FINDINGS: Cardiac shadow is within normal limits. Loop recorder is again noted. The lungs are well aerated bilaterally. Some chronic changes are noted in the mid lungs bilaterally stable from previous exams dating back to 2017. No new focal infiltrate or effusion is seen. Scattered large and small bowel gas is noted. No free air is not seen. Degenerative changes of the lumbar spine are noted. No acute bony abnormality is noted. IMPRESSION: Chronic changes in the lungs bilaterally. No new focal abnormality is seen. Electronically Signed   By: Inez Catalina M.D.   On: 02/06/2017 12:13    Assessment & Plan:   Rhea was seen today for gastroesophageal reflux.  Diagnoses and all orders for this visit:  Acute epigastric pain- her x-ray is negative for any concerns for hiatal hernia, free air, diaphragmatic disorder, or obstruction. -     DG Abd Acute W/Chest; Future  Gastroesophageal reflux disease with esophagitis- I will upgrade the treatment of her heartburn from an H2  blocker to a PPI.  I have also offered Phenergan as needed for nausea and vomiting.  She and her daughter will let me know if she develops any new or worsening symptoms. -     omeprazole (PRILOSEC) 40 MG capsule; Take 1 capsule (40 mg total) by mouth daily. -     promethazine (PHENERGAN) 12.5 MG tablet; Take 1 tablet (12.5 mg total) by mouth every 6 (six) hours as needed for nausea or vomiting. -     DG Abd Acute W/Chest; Future   I have discontinued Riane S. Minerd's flecainide, Colchicine, and methylPREDNISolone. I am also having her start on omeprazole and promethazine. Additionally, I am having her maintain her PRESERVISION AREDS, bimatoprost, dorzolamide-timolol, Calcium Carbonate (CALTRATE 600 PO), ranitidine, apixaban, and mirtazapine.  Meds ordered this encounter  Medications  . omeprazole (PRILOSEC) 40 MG capsule    Sig: Take 1 capsule (40 mg  total) by mouth daily.    Dispense:  90 capsule    Refill:  1  . promethazine (PHENERGAN) 12.5 MG tablet    Sig: Take 1 tablet (12.5 mg total) by mouth every 6 (six) hours as needed for nausea or vomiting.    Dispense:  30 tablet    Refill:  0     Follow-up: Return in about 3 months (around 05/06/2017).  Scarlette Calico, MD

## 2017-02-07 NOTE — Telephone Encounter (Signed)
Spoke with patient's daughter, [name] ([place]).  Presenting ECG from manual transmission on 02/03/17 suggests Mobitz II HB.  Previously noted on event monitor per notes.  Presenting ECG this AM shows SR.  Brady detection on LINQ is off due to frequent false detections (undersensing).  No pause episodes noted.   Per [name], patient experienced symptoms of gastritis, reflux, and esophageal burning over the weekend.  Her PCP is addressing these issues.  No episodes to correlate with reported fast HR on Friday.  No dizziness or syncopal episodes, though she has reportedly felt fatigued since Friday.  Patient did not use her symptom activator and did not check her pulse per [name].  She still has flecainide (PRN), but has never taken it since she does not feel she's had any symptomatic SVT episodes in the past few years.  Patient used symptom activator on 02/05/17 at 2103 for burning sensation in chest--ECG suggests ST w/oversensing (per plot) and occasional undersensing vs blocked beat (poor clarity due to artifact). Advised that Dr. Lovena Le will review ECG and we'll call back if any additional recommendations.  ECG printed and placed in [place] folder for review. [name] is agreeable to sooner f/u appt with Dr. Lovena Le, scheduled for 03/03/17 at 2:00pm.  [name] is appreciative and denies additional questions or concerns at this time.  Reviewed presenting ECG from 02/03/17 with Dr. Lovena Le, who recommended no changes at this time as patient has not had presyncopal/syncopal symptoms.  Will continue monitoring remotely via [facility].

## 2017-02-27 DIAGNOSIS — H353132 Nonexudative age-related macular degeneration, bilateral, intermediate dry stage: Secondary | ICD-10-CM | POA: Diagnosis not present

## 2017-02-27 DIAGNOSIS — H401132 Primary open-angle glaucoma, bilateral, moderate stage: Secondary | ICD-10-CM | POA: Diagnosis not present

## 2017-03-03 ENCOUNTER — Ambulatory Visit (INDEPENDENT_AMBULATORY_CARE_PROVIDER_SITE_OTHER): Payer: Medicare Other | Admitting: Internal Medicine

## 2017-03-03 ENCOUNTER — Encounter: Payer: Self-pay | Admitting: Internal Medicine

## 2017-03-03 VITALS — BP 150/70 | HR 70 | Ht 64.0 in | Wt 109.0 lb

## 2017-03-03 DIAGNOSIS — I480 Paroxysmal atrial fibrillation: Secondary | ICD-10-CM | POA: Diagnosis not present

## 2017-03-03 DIAGNOSIS — R002 Palpitations: Secondary | ICD-10-CM | POA: Diagnosis not present

## 2017-03-03 NOTE — Progress Notes (Signed)
HPI Mrs. Baucum returns today for followup. She is a pleasant 84 yo woman with palpitations and tachycardia on cardiac monitoring who had an ILR placed almost 2 years ago. In the interim, she has been stable and has gained an additional 7 lbs. She has noise as well on her ILR. Rare palpitations. She has fallen once. No clear cut atrial fib since her last visit although she has several episodes of atrial tachycardia and probably also some atrial fib in the setting of lead noise. Allergies  Allergen Reactions  . Codeine Other (See Comments)    Passed out     Current Outpatient Medications  Medication Sig Dispense Refill  . apixaban (ELIQUIS) 2.5 MG TABS tablet Take 1 tablet (2.5 mg total) by mouth 2 (two) times daily. 60 tablet 5  . bimatoprost (LUMIGAN) 0.01 % SOLN Place 1 drop into both eyes at bedtime.    . Calcium Carbonate (CALTRATE 600 PO) Take 2 tablets by mouth daily.    . dorzolamide-timolol (COSOPT) 22.3-6.8 MG/ML ophthalmic solution Place 1 drop into both eyes 2 (two) times daily.     . flecainide (TAMBOCOR) 50 MG tablet Take 50 mg by mouth daily as needed.     . mirtazapine (REMERON) 15 MG tablet Take 1 tablet (15 mg total) by mouth at bedtime. 90 tablet 3  . Multiple Vitamins-Minerals (PRESERVISION AREDS) CAPS Take 1 capsule by mouth 2 (two) times daily.    Marland Kitchen omeprazole (PRILOSEC) 40 MG capsule Take 1 capsule (40 mg total) by mouth daily. 90 capsule 1  . promethazine (PHENERGAN) 12.5 MG tablet Take 1 tablet (12.5 mg total) by mouth every 6 (six) hours as needed for nausea or vomiting. 30 tablet 0   No current facility-administered medications for this visit.      Past Medical History:  Diagnosis Date  . Anemia   . Arthritis    Osteoporosis  . Bleeds easily [facility])    "father was a hemophiliac" - pt not being bothered in recent years.  . Calcium deposit in bursa of knee 2017  . Cancer ([place])    skin cancer "basal cell".  . Chicken pox   . Cyst, Baker's knee    left knee- tx. Cortisone injection 05-05-15 in office- "improved pain relief and swelling left ankle and knee"  . Diverticulitis   . Dysrhythmia    Dr. McAlhany-cardiology  . Glaucoma of both eyes   . Hearing loss of both ears    Bilateral ears- hearing aids used- right ear hearing betther than left.  . History of blood transfusion 1957   "lots; most related to my periods"-last 1968 -s/p Hysterectomy  . Migraines    "years ago" (02/04/2013)  . PONV (postoperative nausea and vomiting)   . Self-catheterizes urinary bladder    "daily- retained urine and chronic UTI"  . SVT (supraventricular tachycardia) ([facility])    Dr. Darnell Level. Margarethe Virgen-follows "loop recorder left chest"  . Swallowing difficulty    freq occ. mostley liquids  . Urine incontinence   . UTI (urinary tract infection)    Chronic Urinary tract infections- tx Macrobid daily.  . Vertigo     ROS:   All systems reviewed and negative except as noted in the HPI.   Past Surgical History:  Procedure Laterality Date  . APPENDECTOMY  1948  . BLADDER SURGERY  1995   Repair-   . BREAST BIOPSY Bilateral    "both were fine"  . CATARACT EXTRACTION W/ INTRAOCULAR LENS IMPLANT Left   .  CHOLECYSTECTOMY  2014  . COMBINED HYSTERECTOMY ABDOMINAL W/ A&P REPAIR / OOPHORECTOMY    . DILATION AND CURETTAGE OF UTERUS    . EP IMPLANTABLE DEVICE N/A 06/19/2014   Procedure: Loop Recorder Insertion;  Surgeon: Evans Lance, MD;  Location: [facility];  Service: Cardiovascular;  Laterality: N/A;  . EXCISION OF BREAST BIOPSY Right 05/13/2015   Procedure: RIGHT BREAST EXCISIONAL BIOPSY x2;  Surgeon: Armandina Gemma, MD;  Location: [facility];  Service: General;  Laterality: Right;  . TONSILLECTOMY  1946  . TOTAL ABDOMINAL HYSTERECTOMY    . VAGINAL HYSTERECTOMY  1968     Family History  Problem Relation Age of Onset  . Stroke Father   . Cancer - Other Sister        Breast  . Cancer - Other Brother        Throat  . Cancer - Other Sister        Throat  .  Cancer - Other Unknown        Brain     Social History   Socioeconomic History  . Marital status: Widowed    Spouse name: Not on file  . Number of children: 4  . Years of education: 70  . Highest education level: Not on file  Social Needs  . Financial resource strain: Not on file  . Food insecurity - worry: Not on file  . Food insecurity - inability: Not on file  . Transportation needs - medical: Not on file  . Transportation needs - non-medical: Not on file  Occupational History  . Occupation: Runner, broadcasting/film/video  Tobacco Use  . Smoking status: Never Smoker  . Smokeless tobacco: Never Used  Substance and Sexual Activity  . Alcohol use: No    Alcohol/week: 0.0 oz  . Drug use: No  . Sexual activity: No  Other Topics Concern  . Not on file  Social History Narrative   Lives alone in a one story home.  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      BP (!) 150/70 (BP Location: Left Arm, Patient Position: Sitting, Cuff Size: Normal)   Pulse 70   Ht 5\' 4"  (1.626 m)   Wt 109 lb (49.4 kg)   SpO2 97%   BMI 18.71 kg/m   Physical Exam:  Well appearing elderly woman, NAD HEENT: Unremarkable Neck:  6 cm JVD, no thyromegally Lymphatics:  No adenopathy Back:  No CVA tenderness Lungs:  Clear with no wheezes HEART:  Regular rate rhythm, no murmurs, no rubs, no clicks Abd:  soft, positive bowel sounds, no organomegally, no rebound, no guarding Ext:  2 plus pulses, no edema, no cyanosis, no clubbing Skin:  No rashes no nodules Neuro:  CN II through XII intact, motor grossly intact  EKG - NSR, cannot exclude septal MI  DEVICE  Normal device function.  See PaceArt for details. Noise noted  Assess/Plan: 1. Atrial fib - she is minimally symptomatic and is tolerating her flecainide and eliquis. Continue 2. Atrial tachycardia - she has had a few episodes but is minimally symptomatic. Will follow. 3. ILR - her device has some noise at times. Will follow.  4. Failure to  thrive - at this time she is in remission. She feels well and her weight and appetite are both good.  Mikle Bosworth.D.

## 2017-03-03 NOTE — Patient Instructions (Addendum)

## 2017-03-04 ENCOUNTER — Encounter: Payer: Self-pay | Admitting: Internal Medicine

## 2017-03-06 DIAGNOSIS — H401132 Primary open-angle glaucoma, bilateral, moderate stage: Secondary | ICD-10-CM | POA: Diagnosis not present

## 2017-03-06 DIAGNOSIS — H353132 Nonexudative age-related macular degeneration, bilateral, intermediate dry stage: Secondary | ICD-10-CM | POA: Diagnosis not present

## 2017-03-06 LAB — CUP PACEART INCLINIC DEVICE CHECK
Date Time Interrogation Session: 20190301202737
Implantable Pulse Generator Implant Date: 20160616

## 2017-03-08 ENCOUNTER — Ambulatory Visit (INDEPENDENT_AMBULATORY_CARE_PROVIDER_SITE_OTHER): Payer: Medicare Other | Admitting: *Deleted

## 2017-03-08 DIAGNOSIS — R002 Palpitations: Secondary | ICD-10-CM

## 2017-03-09 ENCOUNTER — Other Ambulatory Visit: Payer: Self-pay | Admitting: Internal Medicine

## 2017-03-09 NOTE — Progress Notes (Signed)
Carelink Summary Report / Loop Recorder 

## 2017-04-01 ENCOUNTER — Other Ambulatory Visit: Payer: Self-pay | Admitting: Internal Medicine

## 2017-04-08 ENCOUNTER — Other Ambulatory Visit: Payer: Self-pay | Admitting: Pulmonary Disease

## 2017-04-10 ENCOUNTER — Ambulatory Visit (INDEPENDENT_AMBULATORY_CARE_PROVIDER_SITE_OTHER): Payer: Medicare Other | Admitting: *Deleted

## 2017-04-10 ENCOUNTER — Telehealth: Payer: Self-pay | Admitting: *Deleted

## 2017-04-10 DIAGNOSIS — R002 Palpitations: Secondary | ICD-10-CM

## 2017-04-10 NOTE — Telephone Encounter (Signed)
Spoke with patient to request manual Carelink transmission for review.  Patient agrees to send transmission today.  Advised I will call her back if transmission is not received and patient is agreeable.  She denies questions or concerns at this time.  Received alert for 1 "pause" episode, but did not receive ECG.  Will review manual transmission when received.

## 2017-04-11 ENCOUNTER — Encounter: Payer: Self-pay | Admitting: Pulmonary Disease

## 2017-04-11 NOTE — Telephone Encounter (Signed)
Dr. Vaughan Browner,  Please see message below from pt:  Hello Dr. Vaughan Browner,   I'm writing to request a refill on the liquid antibiotic you prescribed to replace Nitrofurantoin.I was taking Nitrofurantoin prophylactically due to daily in/out bladder catherization.You were concerned the Nitrofurantoin was causing lung inflammation and changed to the liquid antibiotic.It has worked well for me however I'm out and need [facility]ill [facility] [place].Thank you.

## 2017-04-11 NOTE — Progress Notes (Signed)
Carelink Summary Report / Loop Recorder 

## 2017-04-13 MED ORDER — SULFAMETHOXAZOLE-TRIMETHOPRIM 200-40 MG/5ML PO SUSP: 5.0000 mL | ORAL | 3 refills | Status: DC

## 2017-04-13 NOTE — Telephone Encounter (Signed)
Patient returned call.  Assisted her with sending a manual Carelink transmission.  Transmission received, 1 "pause" episode is false--undersensing.  Patient aware we will continue to monitor her remotely via [place].  She is appreciative of call and of assistance.

## 2017-04-13 NOTE — Telephone Encounter (Signed)
Transmission has not been received.  Attempted to LM but message was cut off by answering machine beeping.  Attempted call back but phone rang once and then would beep.  Will try again later.

## 2017-04-20 LAB — CUP PACEART REMOTE DEVICE CHECK
Date Time Interrogation Session: 20190307003658
Implantable Pulse Generator Implant Date: 20160616

## 2017-05-12 LAB — CUP PACEART REMOTE DEVICE CHECK
Date Time Interrogation Session: 20190409013938
Implantable Pulse Generator Implant Date: 20160616

## 2017-05-15 ENCOUNTER — Ambulatory Visit (INDEPENDENT_AMBULATORY_CARE_PROVIDER_SITE_OTHER): Payer: Medicare Other | Admitting: *Deleted

## 2017-05-15 DIAGNOSIS — R002 Palpitations: Secondary | ICD-10-CM

## 2017-05-15 NOTE — Progress Notes (Signed)
Carelink Summary Report / Loop Recorder 

## 2017-05-24 ENCOUNTER — Telehealth: Payer: Self-pay | Admitting: Emergency Medicine

## 2017-05-24 NOTE — Telephone Encounter (Signed)
Called patient to schedule AWV. Patient declined at this time. 

## 2017-06-07 LAB — CUP PACEART REMOTE DEVICE CHECK
Date Time Interrogation Session: 20190512021001
Implantable Pulse Generator Implant Date: 20160616

## 2017-06-15 ENCOUNTER — Ambulatory Visit (INDEPENDENT_AMBULATORY_CARE_PROVIDER_SITE_OTHER): Payer: Medicare Other | Admitting: *Deleted

## 2017-06-15 DIAGNOSIS — R002 Palpitations: Secondary | ICD-10-CM

## 2017-06-16 NOTE — Progress Notes (Signed)
Carelink Summary Report / Loop Recorder 

## 2017-07-11 ENCOUNTER — Encounter: Payer: Self-pay | Admitting: Internal Medicine

## 2017-07-12 MED ORDER — CATHETERS MISC: 1 refills | Status: AC

## 2017-07-14 ENCOUNTER — Ambulatory Visit (INDEPENDENT_AMBULATORY_CARE_PROVIDER_SITE_OTHER): Payer: Medicare Other | Admitting: *Deleted

## 2017-07-14 DIAGNOSIS — R002 Palpitations: Secondary | ICD-10-CM | POA: Diagnosis not present

## 2017-07-19 NOTE — Progress Notes (Signed)
Carelink Summary Report / Loop Recorder 

## 2017-07-21 LAB — CUP PACEART REMOTE DEVICE CHECK
Date Time Interrogation Session: 20190614020539
Implantable Pulse Generator Implant Date: 20160616

## 2017-07-26 ENCOUNTER — Other Ambulatory Visit: Payer: Self-pay | Admitting: Internal Medicine

## 2017-07-26 DIAGNOSIS — K2100 Gastro-esophageal reflux disease with esophagitis, without bleeding: Secondary | ICD-10-CM

## 2017-07-27 NOTE — Telephone Encounter (Signed)
Eliquis 2.5mg  refill request received; pt is 84 yrs old, wt-49.4kg, Crea-0.71 on 08/06/16, and last seen by Dr. Lovena Le on 03/03/2017; will send in refill to requested pharmacy and pt has a recall for [place] within 1 month.

## 2017-08-21 ENCOUNTER — Ambulatory Visit (INDEPENDENT_AMBULATORY_CARE_PROVIDER_SITE_OTHER): Payer: Medicare Other | Admitting: *Deleted

## 2017-08-21 DIAGNOSIS — R002 Palpitations: Secondary | ICD-10-CM

## 2017-08-21 NOTE — Progress Notes (Signed)
Carelink Summary Report / Loop Recorder 

## 2017-08-24 LAB — CUP PACEART REMOTE DEVICE CHECK
Date Time Interrogation Session: 20190717023546
Implantable Pulse Generator Implant Date: 20160616

## 2017-09-18 ENCOUNTER — Encounter: Payer: Self-pay | Admitting: Family

## 2017-09-18 ENCOUNTER — Telehealth: Payer: Self-pay | Admitting: Internal Medicine

## 2017-09-18 ENCOUNTER — Ambulatory Visit (INDEPENDENT_AMBULATORY_CARE_PROVIDER_SITE_OTHER): Payer: Medicare Other | Admitting: Family

## 2017-09-18 VITALS — BP 138/70 | HR 74 | Temp 97.9°F | Ht 64.0 in | Wt 106.0 lb

## 2017-09-18 DIAGNOSIS — M109 Gout, unspecified: Secondary | ICD-10-CM | POA: Diagnosis not present

## 2017-09-18 DIAGNOSIS — K2100 Gastro-esophageal reflux disease with esophagitis, without bleeding: Secondary | ICD-10-CM

## 2017-09-18 MED ORDER — METHYLPREDNISOLONE 4 MG PO TBPK: ORAL_TABLET | ORAL | 0 refills | Status: DC

## 2017-09-18 MED ORDER — OMEPRAZOLE 20 MG PO CPDR: 20.0000 mg | DELAYED_RELEASE_CAPSULE | Freq: Two times a day (BID) | ORAL | 0 refills | Status: DC

## 2017-09-18 MED ORDER — METHYLPREDNISOLONE ACETATE 40 MG/ML IJ SUSP
40.0000 mg | Freq: Once | INTRAMUSCULAR | Status: AC
  Administered 2017-09-18: 40 mg via INTRAMUSCULAR

## 2017-09-18 MED ORDER — TRAMADOL HCL 50 MG PO TABS: 50.0000 mg | ORAL_TABLET | Freq: Three times a day (TID) | ORAL | 0 refills | Status: DC | PRN

## 2017-09-18 NOTE — Telephone Encounter (Signed)
Pt needs to be evaluate

## 2017-09-18 NOTE — Telephone Encounter (Signed)
Copied from [place] [id number]. Topic: Quick Communication - See Telephone Encounter >> Sep 18, 2017  8:47 AM Ivar Drape wrote: CRM for notification. See Telephone encounter for: 09/18/17. Patient's daughter, Rosiland Oz [phone], stated her mother has a flare up in her left foot and knee and wanted to know if some Prednisone can be called in for her because it cleared up the problem the first time.  The patient is in a lot of pain and puts her in a position to fall.  She would like to have it sent to [facility] [place].

## 2017-09-18 NOTE — Telephone Encounter (Signed)
Pt scheduled today

## 2017-09-18 NOTE — Progress Notes (Signed)
Julie Silva is a 84 y.o. female with the following history as recorded in Epic[employer]:  Patient Active Problem List   Diagnosis Date Noted  . Acute epigastric pain 02/06/2017  . Acute idiopathic gout 02/02/2017  . Abnormal CT scan, chest 10/06/2015  . Intraductal papillary adenocarcinoma of female breast 04/29/2015  . Abnormal chest x-ray with multiple lung nodules 04/29/2015  . Diastolic dysfunction 83/38/2505  . Second degree AV block 03/25/2014  . Gastroesophageal reflux disease with esophagitis 07/31/2013  . Dysphagia 07/31/2013  . Essential hypertension, benign 05/07/2013  . Hyperlipidemia with target LDL less than 130 05/07/2013  . Routine general medical examination at a health care facility 05/07/2013  . SVT (supraventricular tachycardia) ([place]) 04/16/2013  . Urinary retention 02/03/2013  . Osteopenia 02/03/2013    Current Outpatient Medications  Medication Sig Dispense Refill  . bimatoprost (LUMIGAN) 0.01 % SOLN Place 1 drop into both eyes at bedtime.    . Calcium Carbonate (CALTRATE 600 PO) Take 2 tablets by mouth daily.    . Catheters MISC Replace catheter twice daily. 180 each 1  . dorzolamide-timolol (COSOPT) 22.3-6.8 MG/ML ophthalmic solution Place 1 drop into both eyes 2 (two) times daily.     Marland Kitchen ELIQUIS 2.5 MG TABS tablet TAKE 1 TABLET BY MOUTH TWICE A DAY 180 tablet 1  . mirtazapine (REMERON) 15 MG tablet Take 1 tablet (15 mg total) by mouth at bedtime. 90 tablet 3  . Multiple Vitamins-Minerals (PRESERVISION AREDS) CAPS Take 1 capsule by mouth 2 (two) times daily.    Marland Kitchen omeprazole (PRILOSEC) 20 MG capsule Take 1 capsule (20 mg total) by mouth 2 (two) times daily. 60 capsule 0  . promethazine (PHENERGAN) 12.5 MG tablet Take 1 tablet (12.5 mg total) by mouth every 6 (six) hours as needed for nausea or vomiting. 30 tablet 0  . sulfamethoxazole-trimethoprim (BACTRIM,SEPTRA) 200-40 MG/5ML suspension Take 5 mLs by mouth 3 (three) times a week. 100 mL 3  . methylPREDNISolone  (MEDROL DOSEPAK) 4 MG TBPK tablet Take as directed 21 tablet 0  . traMADol (ULTRAM) 50 MG tablet Take 1 tablet (50 mg total) by mouth every 8 (eight) hours as needed. 30 tablet 0   No current facility-administered medications for this visit.     Allergies: Codeine  Past Medical History:  Diagnosis Date  . Anemia   . Arthritis    Osteoporosis  . Bleeds easily [facility])    "father was a hemophiliac" - pt not being bothered in recent years.  . Calcium deposit in bursa of knee 2017  . Cancer ([place])    skin cancer "basal cell".  . Chicken pox   . Cyst, Baker's knee    left knee- tx. Cortisone injection 05-05-15 in office- "improved pain relief and swelling left ankle and knee"  . Diverticulitis   . Dysrhythmia    Dr. McAlhany-cardiology  . Glaucoma of both eyes   . Hearing loss of both ears    Bilateral ears- hearing aids used- right ear hearing betther than left.  . History of blood transfusion 1957   "lots; most related to my periods"-last 1968 -s/p Hysterectomy  . Migraines    "years ago" (02/04/2013)  . PONV (postoperative nausea and vomiting)   . Self-catheterizes urinary bladder    "daily- retained urine and chronic UTI"  . SVT (supraventricular tachycardia) ([facility])    Dr. Darnell Level. [place]-follows "loop recorder left chest"  . Swallowing difficulty    freq occ. mostley liquids  . Urine incontinence   . UTI (  urinary tract infection)    Chronic Urinary tract infections- tx Macrobid daily.  . Vertigo     Past Surgical History:  Procedure Laterality Date  . APPENDECTOMY  1948  . BLADDER SURGERY  1995   Repair-   . BREAST BIOPSY Bilateral    "both were fine"  . CATARACT EXTRACTION W/ INTRAOCULAR LENS IMPLANT Left   . CHOLECYSTECTOMY  2014  . COMBINED HYSTERECTOMY ABDOMINAL W/ A&P REPAIR / OOPHORECTOMY    . DILATION AND CURETTAGE OF UTERUS    . EP IMPLANTABLE DEVICE N/A 06/19/2014   Procedure: Loop Recorder Insertion;  Surgeon: Evans Lance, MD;  Location: [facility];  Service:  Cardiovascular;  Laterality: N/A;  . EXCISION OF BREAST BIOPSY Right 05/13/2015   Procedure: RIGHT BREAST EXCISIONAL BIOPSY x2;  Surgeon: Armandina Gemma, MD;  Location: [facility];  Service: General;  Laterality: Right;  . TONSILLECTOMY  1946  . TOTAL ABDOMINAL HYSTERECTOMY    . VAGINAL HYSTERECTOMY  1968    Family History  Problem Relation Age of Onset  . Stroke Father   . Cancer - Other Sister        Breast  . Cancer - Other Brother        Throat  . Cancer - Other Sister        Throat  . Cancer - Other Unknown        Brain    Social History   Tobacco Use  . Smoking status: Never Smoker  . Smokeless tobacco: Never Used  Substance Use Topics  . Alcohol use: No    Alcohol/week: 0.0 standard drinks    Subjective:  Patient is accompanied by her daughter today; history of gout; notes that last gout flare approximately 3 months ago; this particular flare started over the weekend- started in left foot and feels like has progressed into left knee in the past 24 hours; has used steroid pack in the past with good relief of her symptoms; no fever, no known injury or trauma; Daughter is also concerned about the dosage of Omeprazole; wonders if her mother could try a lower dosage;   Objective:  Vitals:   09/18/17 1405  BP: 138/70  Pulse: 74  Temp: 97.9 F (36.6 C)  TempSrc: Oral  SpO2: 97%  Weight: 106 lb 0.6 oz (48.1 kg)  Height: 5\' 4"  (1.626 m)    General: Well developed, well nourished, in no acute distress  Skin : Warm and dry.  Head: Normocephalic and atraumatic  Lungs: Respirations unlabored; clear to auscultation bilaterally without wheeze, rales, rhonchi  CVS exam: normal rate and regular rhythm.  Musculoskeletal: No deformities; swelling noted over medial left knee/ redness on top of right foot Extremities: No edema, cyanosis, clubbing  Vessels: Symmetric bilaterally  Neurologic: Alert and oriented; speech intact; face symmetrical; moves all extremities well; CNII-XII intact  without focal deficit  Assessment:  1. Gout of left knee, unspecified cause, unspecified chronicity   2. Gastroesophageal reflux disease with esophagitis     Plan:  1. Depo-Medrol IM 40 mg given in office; start Medrol Dose Pak tomorrow- take as directed; refill on Tramadol to use as needed for pain; 2. Trial of Omeprazole 20 mg qd- can increase to bid if needed; do not start taper until completed prednisone.  Scheduled for CPE with Dr. Ronnald Ramp in the next month- plan for flu shot at that time.   Return in about 1 month (around 10/18/2017) for with Dr. Ronnald Ramp for CPE.  No orders  of the defined types were placed in this encounter.   Requested Prescriptions   Signed Prescriptions Disp Refills  . omeprazole (PRILOSEC) 20 MG capsule 60 capsule 0    Sig: Take 1 capsule (20 mg total) by mouth 2 (two) times daily.  . methylPREDNISolone (MEDROL DOSEPAK) 4 MG TBPK tablet 21 tablet 0    Sig: Take as directed  . traMADol (ULTRAM) 50 MG tablet 30 tablet 0    Sig: Take 1 tablet (50 mg total) by mouth every 8 (eight) hours as needed.

## 2017-09-18 NOTE — Telephone Encounter (Signed)
Copied from [place] [id number]. Topic: Quick Communication - Rx Refill/Question >> Sep 18, 2017  4:21 PM Jarold Motto, [name] wrote: Medication: pharmacy called and stated that insurance will need a prior authorization for omeprazole (PRILOSEC) 20 MG capsule [[id number]] please advise

## 2017-09-19 NOTE — Telephone Encounter (Signed)
Called and spoke to [place] (pt dtr) to verify the patients rx insurance coverage. [name] will get back in touch with me with the rx insurance info.

## 2017-09-21 ENCOUNTER — Telehealth: Payer: Self-pay

## 2017-09-21 NOTE — Telephone Encounter (Signed)
Pharmacy sent over prior authorization for Omeprazole for patient to take it 2 caps daily. Is there something else you can send in or do you want me to see if I can obtain prior-auth for her to get it filled?  They were requesting an alternative or change for her to take it daily.  Nial Hawe~

## 2017-09-21 NOTE — Telephone Encounter (Signed)
Please let her know they won't pay for 20 mg bid; will have to stick with 40 mg daily or we can try a different RX; I would not recommend doing PA.

## 2017-09-22 NOTE — Telephone Encounter (Signed)
Spoke with patient's daughter today, Mardene Celeste and info given.

## 2017-09-25 ENCOUNTER — Ambulatory Visit (INDEPENDENT_AMBULATORY_CARE_PROVIDER_SITE_OTHER): Payer: Medicare Other | Admitting: *Deleted

## 2017-09-25 DIAGNOSIS — R002 Palpitations: Secondary | ICD-10-CM

## 2017-09-25 LAB — CUP PACEART REMOTE DEVICE CHECK
Date Time Interrogation Session: 20190819033934
Implantable Pulse Generator Implant Date: 20160616

## 2017-09-25 NOTE — Progress Notes (Signed)
Carelink Summary Report / Loop Recorder 

## 2017-10-02 LAB — CUP PACEART REMOTE DEVICE CHECK
Date Time Interrogation Session: 20190921034246
Implantable Pulse Generator Implant Date: 20160616

## 2017-10-25 NOTE — Progress Notes (Addendum)
Subjective:   Julie Silva is a 84 y.o. female who presents for Medicare Annual (Subsequent) preventive examination.  Review of Systems:  No ROS.  Medicare Wellness Visit. Additional risk factors are reflected in the social history.  Cardiac Risk Factors include: advanced age (>11men, >21 women);dyslipidemia;hypertension Sleep patterns: feels rested on waking and sleeps 7-8 hours nightly.    Home Safety/Smoke Alarms: Feels safe in home. Smoke alarms in place.  Living environment; residence and Firearm Safety: 1-story house/ trailer, equipment: Cane, Type: Wide [employer], no firearms. Seat Belt Safety/Bike Helmet: Wears seat belt.     Objective:     Vitals: BP 132/74   Pulse 64   Resp 17   Ht 5\' 4"  (1.626 m)   Wt 107 lb (48.5 kg)   SpO2 98%   BMI 18.37 kg/m   Body mass index is 18.37 kg/m.  Advanced Directives 10/26/2017 03/06/2016 05/13/2015 05/08/2015 02/03/2013  Does Patient Have a Medical Advance Directive? No No No No Patient would like information  Does patient want to make changes to medical advance directive? Yes (ED - Information included in AVS) - - - -  Would patient like information on creating a medical advance directive? - No - Patient declined No - patient declined information Yes - Scientist, clinical (histocompatibility and immunogenetics) given Advance directive packet given    Tobacco Social History   Tobacco Use  Smoking Status Never Smoker  Smokeless Tobacco Never Used     Counseling given: Not Answered  Past Medical History:  Diagnosis Date  . Anemia   . Arthritis    Osteoporosis  . Bleeds easily [facility])    "father was a hemophiliac" - pt not being bothered in recent years.  . Calcium deposit in bursa of knee 2017  . Cancer ([place])    skin cancer "basal cell".  . Chicken pox   . Cyst, Baker's knee    left knee- tx. Cortisone injection 05-05-15 in office- "improved pain relief and swelling left ankle and knee"  . Diverticulitis   . Dysrhythmia    Dr. McAlhany-cardiology  .  Glaucoma of both eyes   . Hearing loss of both ears    Bilateral ears- hearing aids used- right ear hearing betther than left.  . History of blood transfusion 1957   "lots; most related to my periods"-last 1968 -s/p Hysterectomy  . Migraines    "years ago" (02/04/2013)  . PONV (postoperative nausea and vomiting)   . Self-catheterizes urinary bladder    "daily- retained urine and chronic UTI"  . SVT (supraventricular tachycardia) ([facility])    Dr. Darnell Level. [place]-follows "loop recorder left chest"  . Swallowing difficulty    freq occ. mostley liquids  . Urine incontinence   . UTI (urinary tract infection)    Chronic Urinary tract infections- tx Macrobid daily.  . Vertigo    Past Surgical History:  Procedure Laterality Date  . APPENDECTOMY  1948  . BLADDER SURGERY  1995   Repair-   . BREAST BIOPSY Bilateral    "both were fine"  . CATARACT EXTRACTION W/ INTRAOCULAR LENS IMPLANT Left   . CHOLECYSTECTOMY  2014  . COMBINED HYSTERECTOMY ABDOMINAL W/ A&P REPAIR / OOPHORECTOMY    . DILATION AND CURETTAGE OF UTERUS    . EP IMPLANTABLE DEVICE N/A 06/19/2014   Procedure: Loop Recorder Insertion;  Surgeon: Evans Lance, MD;  Location: [facility];  Service: Cardiovascular;  Laterality: N/A;  . EXCISION OF BREAST BIOPSY Right 05/13/2015   Procedure: RIGHT  BREAST EXCISIONAL BIOPSY x2;  Surgeon: Armandina Gemma, MD;  Location: [facility];  Service: General;  Laterality: Right;  . TONSILLECTOMY  1946  . TOTAL ABDOMINAL HYSTERECTOMY    . VAGINAL HYSTERECTOMY  1968   Family History  Problem Relation Age of Onset  . Stroke Father   . Cancer - Other Sister        Breast  . Cancer - Other Brother        Throat  . Cancer - Other Sister        Throat  . Cancer - Other Unknown        Brain   Social History   Socioeconomic History  . Marital status: Widowed    Spouse name: Not on file  . Number of children: 4  . Years of education: 91  . Highest education level: Not on file  Occupational History    . Occupation: Runner, broadcasting/film/video  Social Needs  . Financial resource strain: Not hard at all  . Food insecurity:    Worry: Never true    Inability: Never true  . Transportation needs:    Medical: No    Non-medical: No  Tobacco Use  . Smoking status: Never Smoker  . Smokeless tobacco: Never Used  Substance and Sexual Activity  . Alcohol use: No    Alcohol/week: 0.0 standard drinks  . Drug use: No  . Sexual activity: Never  Lifestyle  . Physical activity:    Days per week: 0 days    Minutes per session: 0 min  . Stress: Not at all  Relationships  . Social connections:    Talks on phone: More than three times a week    Gets together: More than three times a week    Attends religious service: 1 to 4 times per year    Active member of club or organization: Yes    Attends meetings of clubs or organizations: 1 to 4 times per year    Relationship status: Widowed  Other Topics Concern  . Not on file  Social History Narrative   Lives alone in a one story home.  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     Outpatient Encounter Medications as of 10/26/2017  Medication Sig  . bimatoprost (LUMIGAN) 0.01 % SOLN Place 1 drop into both eyes at bedtime.  . Catheters MISC Replace catheter twice daily.  . dorzolamide-timolol (COSOPT) 22.3-6.8 MG/ML ophthalmic solution Place 1 drop into both eyes 2 (two) times daily.   Marland Kitchen ELIQUIS 2.5 MG TABS tablet TAKE 1 TABLET BY MOUTH TWICE A DAY  . methylPREDNISolone (MEDROL DOSEPAK) 4 MG TBPK tablet Take as directed  . mirtazapine (REMERON) 15 MG tablet Take 1 tablet (15 mg total) by mouth at bedtime.  Marland Kitchen omeprazole (PRILOSEC) 20 MG capsule Take 1 capsule (20 mg total) by mouth 2 (two) times daily.  . promethazine (PHENERGAN) 12.5 MG tablet Take 1 tablet (12.5 mg total) by mouth every 6 (six) hours as needed for nausea or vomiting.  . sulfamethoxazole-trimethoprim (BACTRIM,SEPTRA) 200-40 MG/5ML suspension Take 5 mLs by mouth 3 (three) times a week.   . traMADol (ULTRAM) 50 MG tablet Take 1 tablet (50 mg total) by mouth every 8 (eight) hours as needed.  . [DISCONTINUED] Calcium Carbonate (CALTRATE 600 PO) Take 2 tablets by mouth daily.  . [DISCONTINUED] Multiple Vitamins-Minerals (PRESERVISION AREDS) CAPS Take 1 capsule by mouth 2 (two) times daily.   No facility-administered encounter medications on file as of 10/26/2017.  Activities of Daily Living In your present state of health, do you have any difficulty performing the following activities: 10/26/2017  Hearing? Y  Vision? N  Difficulty concentrating or making decisions? N  Walking or climbing stairs? Y  Dressing or bathing? N  Doing errands, shopping? Y  Preparing Food and eating ? N  Using the Toilet? N  In the past six months, have you accidently leaked urine? Y  Comment self caths  Do you have problems with loss of bowel control? Y  Comment bouts of diarrhea   Managing your Medications? N  Managing your Finances? N  Housekeeping or managing your Housekeeping? N  Some recent data might be hidden    Patient Care Team: Janith Lima, MD as PCP - General (Internal Medicine) Apolonio Schneiders, MD as Referring Physician (Specialist) Evans Lance, MD as Consulting Physician (Cardiology) Marshell Garfinkel, MD as Consulting Physician (Pulmonary Disease) Estill Cotta, MD (Ophthalmology)    Assessment:   This is a routine wellness examination for Machaela. Physical assessment deferred to PCP.   Exercise Activities and Dietary recommendations Current Exercise Habits: The patient does not participate in regular exercise at present, Exercise limited by: None identified  Diet (meal preparation, eat out, water intake, caffeinated beverages, dairy products, fruits and vegetables): in general, a "healthy" diet  , well balanced   Drinks Boost x 2 daily. Encouraged patient to increase daily water and healthy fluid intake.    Goals    . Patient Stated     Stay as  healthy and as independent as possible       Fall Risk Fall Risk  10/26/2017 04/04/2016 09/03/2015 05/07/2013  Falls in the past year? No Yes No No  Comment - - Emmi Telephone Survey: data to providers prior to load -  Number falls in past yr: - 1 - -  Injury with Fall? - Yes - -  Risk for fall due to : Impaired balance/gait - - -    Depression Screen PHQ 2/9 Scores 10/26/2017 04/04/2016 05/07/2013  PHQ - 2 Score 0 0 0     Cognitive Function MMSE - Mini Mental State Exam 10/26/2017  Not completed: Refused       Ad8 score reviewed for issues:  Issues making decisions: no  Less interest in hobbies / activities: no  Repeats questions, stories (family complaining): no  Trouble using ordinary gadgets (microwave, computer, phone):no  Forgets the month or year: no  Mismanaging finances: no  Remembering appts: no  Daily problems with thinking and/or memory: no Ad8 score is= 0  Immunization History  Administered Date(s) Administered  . Influenza, High Dose Seasonal PF 10/03/2013, 09/25/2014, 10/06/2015, 10/11/2016, 10/26/2017  . Influenza-Unspecified 10/03/2012  . Pneumococcal Conjugate-13 05/07/2013  . Pneumococcal Polysaccharide-23 06/09/2010  . Td 04/03/2000  . Tdap 05/07/2013  . Zoster 06/08/2011   Screening Tests Health Maintenance  Topic Date Due  . INFLUENZA VACCINE  04/04/2018 (Originally 08/03/2017)  . TETANUS/TDAP  05/08/2023  . DEXA SCAN  Completed  . PNA vac Low Risk Adult  Completed      Plan:    Patient c/o dysuria an order was placed for culture, urine comprehensive.  Continue doing brain stimulating activities (puzzles, reading, adult coloring books, staying active) to keep memory sharp.   Continue to eat heart healthy diet (full of fruits, vegetables, whole grains, lean protein, water--limit salt, fat, and sugar intake) and increase physical activity as tolerated.  I have personally reviewed and noted the following in the patient's  chart:    . Medical and social history . Use of alcohol, tobacco or illicit drugs  . Current medications and supplements . Functional ability and status . Nutritional status . Physical activity . Advanced directives . List of other physicians . Vitals . Screenings to include cognitive, depression, and falls . Referrals and appointments  In addition, I have reviewed and discussed with patient certain preventive protocols, quality metrics, and best practice recommendations. A written personalized care plan for preventive services as well as general preventive health recommendations were provided to patient.     Michiel Cowboy, RN  10/26/2017  Medical screening examination/treatment/procedure(s) were performed by non-physician practitioner and as supervising physician I was immediately available for consultation/collaboration. I agree with above. Scarlette Calico, MD

## 2017-10-26 ENCOUNTER — Ambulatory Visit (INDEPENDENT_AMBULATORY_CARE_PROVIDER_SITE_OTHER): Payer: Medicare Other | Admitting: *Deleted

## 2017-10-26 ENCOUNTER — Other Ambulatory Visit: Payer: Self-pay | Admitting: Internal Medicine

## 2017-10-26 ENCOUNTER — Telehealth: Payer: Self-pay | Admitting: *Deleted

## 2017-10-26 VITALS — BP 132/74 | HR 64 | Resp 17 | Ht 64.0 in | Wt 107.0 lb

## 2017-10-26 DIAGNOSIS — R002 Palpitations: Secondary | ICD-10-CM

## 2017-10-26 DIAGNOSIS — M1000 Idiopathic gout, unspecified site: Secondary | ICD-10-CM

## 2017-10-26 DIAGNOSIS — Z23 Encounter for immunization: Secondary | ICD-10-CM

## 2017-10-26 DIAGNOSIS — R300 Dysuria: Secondary | ICD-10-CM

## 2017-10-26 DIAGNOSIS — Z0000 Encounter for general adult medical examination without abnormal findings: Secondary | ICD-10-CM | POA: Diagnosis not present

## 2017-10-26 MED ORDER — METHYLPREDNISOLONE 4 MG PO TBPK: ORAL_TABLET | ORAL | 0 refills | Status: DC

## 2017-10-26 NOTE — Patient Instructions (Addendum)
Continue doing brain stimulating activities (puzzles, reading, adult coloring books, staying active) to keep memory sharp.   Continue to eat heart healthy diet (full of fruits, vegetables, whole grains, lean protein, water--limit salt, fat, and sugar intake) and increase physical activity as tolerated.   Julie Silva , Thank you for taking time to come for your Medicare Wellness Visit. I appreciate your ongoing commitment to your health goals. Please review the following plan we discussed and let me know if I can assist you in the future.   These are the goals we discussed: Goals    . Patient Stated     Stay as healthy and as independent as possible       This is a list of the screening recommended for you and due dates:  Health Maintenance  Topic Date Due  . Flu Shot  04/04/2018*  . Tetanus Vaccine  05/08/2023  . DEXA scan (bone density measurement)  Completed  . Pneumonia vaccines  Completed  *Topic was postponed. The date shown is not the original due date.   Influenza Virus Vaccine injection What is this medicine? INFLUENZA VIRUS VACCINE (in floo EN zuh VAHY ruhs vak SEEN) helps to reduce the risk of getting influenza also known as the flu. The vaccine only helps protect you against some strains of the flu. This medicine may be used for other purposes; ask your health care provider or pharmacist if you have questions. COMMON BRAND NAME(S): Afluria, Agriflu, Alfuria, FLUAD, Fluarix, Fluarix Quadrivalent, Flublok, Flublok Quadrivalent, FLUCELVAX, Flulaval, Fluvirin, Fluzone, Fluzone High-Dose, Fluzone Intradermal What should I tell my health care provider before I take this medicine? They need to know if you have any of these conditions: -bleeding disorder like hemophilia -fever or infection -Guillain-Barre syndrome or other neurological problems -immune system problems -infection with the human immunodeficiency virus (HIV) or AIDS -low blood platelet counts -multiple  sclerosis -an unusual or allergic reaction to influenza virus vaccine, latex, other medicines, foods, dyes, or preservatives. Different brands of vaccines contain different allergens. Some may contain latex or eggs. Talk to your doctor about your allergies to make sure that you get the right vaccine. -pregnant or trying to get pregnant -breast-feeding How should I use this medicine? This vaccine is for injection into a muscle or under the skin. It is given by a health care professional. A copy of Vaccine Information Statements will be given before each vaccination. Read this sheet carefully each time. The sheet may change frequently. Talk to your healthcare provider to see which vaccines are right for you. Some vaccines should not be used in all age groups. Overdosage: If you think you have taken too much of this medicine contact a poison control center or emergency room at once. NOTE: This medicine is only for you. Do not share this medicine with others. What if I miss a dose? This does not apply. What may interact with this medicine? -chemotherapy or radiation therapy -medicines that lower your immune system like etanercept, anakinra, infliximab, and adalimumab -medicines that treat or prevent blood clots like warfarin -phenytoin -steroid medicines like prednisone or cortisone -theophylline -vaccines This list may not describe all possible interactions. Give your health care provider a list of all the medicines, herbs, non-prescription drugs, or dietary supplements you use. Also tell them if you smoke, drink alcohol, or use illegal drugs. Some items may interact with your medicine. What should I watch for while using this medicine? Report any side effects that do not go away within 3  days to your doctor or health care professional. Call your health care provider if any unusual symptoms occur within 6 weeks of receiving this vaccine. You may still catch the flu, but the illness is not usually  as bad. You cannot get the flu from the vaccine. The vaccine will not protect against colds or other illnesses that may cause fever. The vaccine is needed every year. What side effects may I notice from receiving this medicine? Side effects that you should report to your doctor or health care professional as soon as possible: -allergic reactions like skin rash, itching or hives, swelling of the face, lips, or tongue Side effects that usually do not require medical attention (report to your doctor or health care professional if they continue or are bothersome): -fever -headache -muscle aches and pains -pain, tenderness, redness, or swelling at the injection site -tiredness This list may not describe all possible side effects. Call your doctor for medical advice about side effects. You may report side effects to FDA at 1-800-FDA-1088. Where should I keep my medicine? The vaccine will be given by a health care professional in a clinic, pharmacy, doctor's office, or other health care setting. You will not be given vaccine doses to store at home. NOTE: This sheet is a summary. It may not cover all possible information. If you have questions about this medicine, talk to your doctor, pharmacist, or health care provider.  2018 Elsevier/Gold Standard (2014-07-11 10:07:28)  Health Maintenance, Female Adopting a healthy lifestyle and getting preventive care can go a long way to promote health and wellness. Talk with your health care provider about what schedule of regular examinations is right for you. This is a good chance for you to check in with your provider about disease prevention and staying healthy. In between checkups, there are plenty of things you can do on your own. Experts have done a lot of research about which lifestyle changes and preventive measures are most likely to keep you healthy. Ask your health care provider for more information. Weight and diet Eat a healthy diet  Be sure to  include plenty of vegetables, fruits, low-fat dairy products, and lean protein.  Do not eat a lot of foods high in solid fats, added sugars, or salt.  Get regular exercise. This is one of the most important things you can do for your health. ? Most adults should exercise for at least 150 minutes each week. The exercise should increase your heart rate and make you sweat (moderate-intensity exercise). ? Most adults should also do strengthening exercises at least twice a week. This is in addition to the moderate-intensity exercise.  Maintain a healthy weight  Body mass index (BMI) is a measurement that can be used to identify possible weight problems. It estimates body fat based on height and weight. Your health care provider can help determine your BMI and help you achieve or maintain a healthy weight.  For females 31 years of age and older: ? A BMI below 18.5 is considered underweight. ? A BMI of 18.5 to 24.9 is normal. ? A BMI of 25 to 29.9 is considered overweight. ? A BMI of 30 and above is considered obese.  Watch levels of cholesterol and blood lipids  You should start having your blood tested for lipids and cholesterol at 82 years of age, then have this test every 5 years.  You may need to have your cholesterol levels checked more often if: ? Your lipid or cholesterol levels are high. ?  You are older than 82 years of age. ? You are at high risk for heart disease.  Cancer screening Lung Cancer  Lung cancer screening is recommended for adults 65-24 years old who are at high risk for lung cancer because of a history of smoking.  A yearly low-dose CT scan of the lungs is recommended for people who: ? Currently smoke. ? Have quit within the past 15 years. ? Have at least a 30-pack-year history of smoking. A pack year is smoking an average of one pack of cigarettes a day for 1 year.  Yearly screening should continue until it has been 15 years since you quit.  Yearly screening  should stop if you develop a health problem that would prevent you from having lung cancer treatment.  Breast Cancer  Practice breast self-awareness. This means understanding how your breasts normally appear and feel.  It also means doing regular breast self-exams. Let your health care provider know about any changes, no matter how small.  If you are in your 20s or 30s, you should have a clinical breast exam (CBE) by a health care provider every 1-3 years as part of a regular health exam.  If you are 4 or older, have a CBE every year. Also consider having a breast X-ray (mammogram) every year.  If you have a family history of breast cancer, talk to your health care provider about genetic screening.  If you are at high risk for breast cancer, talk to your health care provider about having an MRI and a mammogram every year.  Breast cancer gene (BRCA) assessment is recommended for women who have family members with BRCA-related cancers. BRCA-related cancers include: ? Breast. ? Ovarian. ? Tubal. ? Peritoneal cancers.  Results of the assessment will determine the need for genetic counseling and BRCA1 and BRCA2 testing.  Cervical Cancer Your health care provider may recommend that you be screened regularly for cancer of the pelvic organs (ovaries, uterus, and vagina). This screening involves a pelvic examination, including checking for microscopic changes to the surface of your cervix (Pap test). You may be encouraged to have this screening done every 3 years, beginning at age 31.  For women ages 59-65, health care providers may recommend pelvic exams and Pap testing every 3 years, or they may recommend the Pap and pelvic exam, combined with testing for human papilloma virus (HPV), every 5 years. Some types of HPV increase your risk of cervical cancer. Testing for HPV may also be done on women of any age with unclear Pap test results.  Other health care providers may not recommend any  screening for nonpregnant women who are considered low risk for pelvic cancer and who do not have symptoms. Ask your health care provider if a screening pelvic exam is right for you.  If you have had past treatment for cervical cancer or a condition that could lead to cancer, you need Pap tests and screening for cancer for at least 20 years after your treatment. If Pap tests have been discontinued, your risk factors (such as having a new sexual partner) need to be reassessed to determine if screening should resume. Some women have medical problems that increase the chance of getting cervical cancer. In these cases, your health care provider may recommend more frequent screening and Pap tests.  Colorectal Cancer  This type of cancer can be detected and often prevented.  Routine colorectal cancer screening usually begins at 82 years of age and continues through 82 years of  age.  Your health care provider may recommend screening at an earlier age if you have risk factors for colon cancer.  Your health care provider may also recommend using home test kits to check for hidden blood in the stool.  A small camera at the end of a tube can be used to examine your colon directly (sigmoidoscopy or colonoscopy). This is done to check for the earliest forms of colorectal cancer.  Routine screening usually begins at age 47.  Direct examination of the colon should be repeated every 5-10 years through 82 years of age. However, you may need to be screened more often if early forms of precancerous polyps or small growths are found.  Skin Cancer  Check your skin from head to toe regularly.  Tell your health care provider about any new moles or changes in moles, especially if there is a change in a mole's shape or color.  Also tell your health care provider if you have a mole that is larger than the size of a pencil eraser.  Always use sunscreen. Apply sunscreen liberally and repeatedly throughout the  day.  Protect yourself by wearing long sleeves, pants, a wide-brimmed hat, and sunglasses whenever you are outside.  Heart disease, diabetes, and high blood pressure  High blood pressure causes heart disease and increases the risk of stroke. High blood pressure is more likely to develop in: ? People who have blood pressure in the high end of the normal range (130-139/85-89 mm Hg). ? People who are overweight or obese. ? People who are African American.  If you are 69-21 years of age, have your blood pressure checked every 3-5 years. If you are 66 years of age or older, have your blood pressure checked every year. You should have your blood pressure measured twice-once when you are at a hospital or clinic, and once when you are not at a hospital or clinic. Record the average of the two measurements. To check your blood pressure when you are not at a hospital or clinic, you can use: ? An automated blood pressure machine at a pharmacy. ? A home blood pressure monitor.  If you are between 6 years and 64 years old, ask your health care provider if you should take aspirin to prevent strokes.  Have regular diabetes screenings. This involves taking a blood sample to check your fasting blood sugar level. ? If you are at a normal weight and have a low risk for diabetes, have this test once every three years after 82 years of age. ? If you are overweight and have a high risk for diabetes, consider being tested at a younger age or more often. Preventing infection Hepatitis B  If you have a higher risk for hepatitis B, you should be screened for this virus. You are considered at high risk for hepatitis B if: ? You were born in a country where hepatitis B is common. Ask your health care provider which countries are considered high risk. ? Your parents were born in a high-risk country, and you have not been immunized against hepatitis B (hepatitis B vaccine). ? You have HIV or AIDS. ? You use needles to  inject street drugs. ? You live with someone who has hepatitis B. ? You have had sex with someone who has hepatitis B. ? You get hemodialysis treatment. ? You take certain medicines for conditions, including cancer, organ transplantation, and autoimmune conditions.  Hepatitis C  Blood testing is recommended for: ? Everyone born from  1943 through [birth year]. ? Anyone with known risk factors for hepatitis C.  Sexually transmitted infections (STIs)  You should be screened for sexually transmitted infections (STIs) including gonorrhea and chlamydia if: ? You are sexually active and are younger than 82 years of age. ? You are older than 82 years of age and your health care provider tells you that you are at risk for this type of infection. ? Your sexual activity has changed since you were last screened and you are at an increased risk for chlamydia or gonorrhea. Ask your health care provider if you are at risk.  If you do not have HIV, but are at risk, it may be recommended that you take a prescription medicine daily to prevent HIV infection. This is called pre-exposure prophylaxis (PrEP). You are considered at risk if: ? You are sexually active and do not regularly use condoms or know the HIV status of your partner(s). ? You take drugs by injection. ? You are sexually active with a partner who has HIV.  Talk with your health care provider about whether you are at high risk of being infected with HIV. If you choose to begin PrEP, you should first be tested for HIV. You should then be tested every 3 months for as long as you are taking PrEP. Pregnancy  If you are premenopausal and you may become pregnant, ask your health care provider about preconception counseling.  If you may become pregnant, take 400 to 800 micrograms (mcg) of folic acid every day.  If you want to prevent pregnancy, talk to your health care provider about birth control (contraception). Osteoporosis and  menopause  Osteoporosis is a disease in which the bones lose minerals and strength with aging. This can result in serious bone fractures. Your risk for osteoporosis can be identified using a bone density scan.  If you are 26 years of age or older, or if you are at risk for osteoporosis and fractures, ask your health care provider if you should be screened.  Ask your health care provider whether you should take a calcium or vitamin D supplement to lower your risk for osteoporosis.  Menopause may have certain physical symptoms and risks.  Hormone replacement therapy may reduce some of these symptoms and risks. Talk to your health care provider about whether hormone replacement therapy is right for you. Follow these instructions at home:  Schedule regular health, dental, and eye exams.  Stay current with your immunizations.  Do not use any tobacco products including cigarettes, chewing tobacco, or electronic cigarettes.  If you are pregnant, do not drink alcohol.  If you are breastfeeding, limit how much and how often you drink alcohol.  Limit alcohol intake to no more than 1 drink per day for nonpregnant women. One drink equals 12 ounces of beer, 5 ounces of Demarko Zeimet, or 1 ounces of hard liquor.  Do not use street drugs.  Do not share needles.  Ask your health care provider for help if you need support or information about quitting drugs.  Tell your health care provider if you often feel depressed.  Tell your health care provider if you have ever been abused or do not feel safe at home. This information is not intended to replace advice given to you by your health care provider. Make sure you discuss any questions you have with your health care provider. Document Released: 07/05/2010 Document Revised: 05/28/2015 Document Reviewed: 09/23/2014 Elsevier Interactive Patient Education  [employer].

## 2017-10-26 NOTE — Telephone Encounter (Signed)
During AWV, patient stated that she would like to have a medrol dosepak on hand prophylactically if she should have another gout flare-up. She also stated that she has not been taking Bactrim suspension because she started to get some discharge vaginally after she began to take medication. She currently is experiencing dysuria. An order for comprehensive urine culture has been placed and daughter will bring sample from patient's self catheterization in the morning 10/27/17.

## 2017-10-26 NOTE — Progress Notes (Signed)
Carelink Summary Report / Loop Recorder 

## 2017-10-27 ENCOUNTER — Other Ambulatory Visit: Payer: Medicare Other

## 2017-10-27 DIAGNOSIS — R300 Dysuria: Secondary | ICD-10-CM | POA: Diagnosis not present

## 2017-10-30 ENCOUNTER — Encounter: Payer: Self-pay | Admitting: Internal Medicine

## 2017-10-30 LAB — CULTURE, URINE COMPREHENSIVE
MICRO NUMBER:: 91286268
SPECIMEN QUALITY:: ADEQUATE

## 2017-11-09 ENCOUNTER — Encounter: Payer: Self-pay | Admitting: Internal Medicine

## 2017-11-09 ENCOUNTER — Other Ambulatory Visit: Payer: Self-pay | Admitting: Internal Medicine

## 2017-11-09 DIAGNOSIS — N390 Urinary tract infection, site not specified: Secondary | ICD-10-CM | POA: Insufficient documentation

## 2017-11-09 DIAGNOSIS — B9620 Unspecified Escherichia coli [E. coli] as the cause of diseases classified elsewhere: Secondary | ICD-10-CM

## 2017-11-09 MED ORDER — NITROFURANTOIN MONOHYD MACRO 100 MG PO CAPS: 100.0000 mg | ORAL_CAPSULE | Freq: Two times a day (BID) | ORAL | 0 refills | Status: AC

## 2017-11-10 LAB — CUP PACEART REMOTE DEVICE CHECK
Date Time Interrogation Session: 20191024041028
Implantable Pulse Generator Implant Date: 20160616

## 2017-11-21 ENCOUNTER — Other Ambulatory Visit: Payer: Self-pay

## 2017-11-27 ENCOUNTER — Telehealth: Payer: Self-pay | Admitting: *Deleted

## 2017-11-27 NOTE — Telephone Encounter (Signed)
Left messages on [employer] voicemail [facility]) requesting manual transmission. [facility] phone number to call back.

## 2017-11-28 ENCOUNTER — Ambulatory Visit (INDEPENDENT_AMBULATORY_CARE_PROVIDER_SITE_OTHER): Payer: Medicare Other

## 2017-11-28 DIAGNOSIS — R002 Palpitations: Secondary | ICD-10-CM

## 2017-11-28 NOTE — Progress Notes (Signed)
Carelink Summary Report / Loop Recorder 

## 2017-12-06 ENCOUNTER — Other Ambulatory Visit: Payer: Self-pay | Admitting: Internal Medicine

## 2017-12-06 ENCOUNTER — Encounter: Payer: Self-pay | Admitting: Internal Medicine

## 2017-12-06 DIAGNOSIS — B9620 Unspecified Escherichia coli [E. coli] as the cause of diseases classified elsewhere: Secondary | ICD-10-CM

## 2017-12-06 DIAGNOSIS — N390 Urinary tract infection, site not specified: Principal | ICD-10-CM

## 2017-12-06 NOTE — Telephone Encounter (Signed)
Spoke with [name] ([facility]). Requested manual transmission for review of "pause" episode. Advised that I have no specific data about episode, but that I will review it once transmission is received and call her back to discuss whether the episode was real. She agrees to transmit tomorrow.  [name] reports that patient had a brief episode on Saturday, 11/30, when she felt like she would pass out. It lasted a few seconds and was not associated with palpitations. [name] feels patient was dehydrated as she had been busy visiting with family all day. Advised I will ensure there were no episodes that correlated.  Discussed RRT as of 12/04/17. [name] will discuss with patient regarding her options. Will plan to call back after reviewing manual transmission.

## 2017-12-07 ENCOUNTER — Other Ambulatory Visit: Payer: Medicare Other

## 2017-12-07 DIAGNOSIS — N390 Urinary tract infection, site not specified: Principal | ICD-10-CM

## 2017-12-07 DIAGNOSIS — B9620 Unspecified Escherichia coli [E. coli] as the cause of diseases classified elsewhere: Secondary | ICD-10-CM | POA: Diagnosis not present

## 2017-12-07 NOTE — Telephone Encounter (Signed)
Spoke with [name] to advise that pause episode on 11/23 was false, ECG shows undersensing. [name] reports that she and the patient would like to discuss options regarding patient's ILR with Dr. Lovena Le. Plan to move March recall up to January if possible. Advised I will send message to scheduler for assistance setting up this appointment. [name] is agreeable to this plan and appreciative of call.

## 2017-12-09 LAB — CULTURE, URINE COMPREHENSIVE
MICRO NUMBER:: 91458011
SPECIMEN QUALITY:: ADEQUATE

## 2017-12-13 ENCOUNTER — Encounter: Payer: Self-pay | Admitting: Internal Medicine

## 2017-12-14 ENCOUNTER — Other Ambulatory Visit: Payer: Self-pay | Admitting: Internal Medicine

## 2017-12-14 DIAGNOSIS — B9620 Unspecified Escherichia coli [E. coli] as the cause of diseases classified elsewhere: Secondary | ICD-10-CM

## 2017-12-14 DIAGNOSIS — N390 Urinary tract infection, site not specified: Principal | ICD-10-CM

## 2017-12-14 MED ORDER — NITROFURANTOIN MONOHYD MACRO 100 MG PO CAPS: 100.0000 mg | ORAL_CAPSULE | Freq: Two times a day (BID) | ORAL | 0 refills | Status: AC

## 2017-12-20 DIAGNOSIS — H353132 Nonexudative age-related macular degeneration, bilateral, intermediate dry stage: Secondary | ICD-10-CM | POA: Diagnosis not present

## 2017-12-20 DIAGNOSIS — H401132 Primary open-angle glaucoma, bilateral, moderate stage: Secondary | ICD-10-CM | POA: Diagnosis not present

## 2017-12-23 ENCOUNTER — Other Ambulatory Visit: Payer: Self-pay | Admitting: Internal Medicine

## 2017-12-29 ENCOUNTER — Encounter: Payer: Self-pay | Admitting: Internal Medicine

## 2018-01-01 ENCOUNTER — Ambulatory Visit (INDEPENDENT_AMBULATORY_CARE_PROVIDER_SITE_OTHER): Payer: Medicare Other

## 2018-01-01 DIAGNOSIS — R002 Palpitations: Secondary | ICD-10-CM

## 2018-01-01 NOTE — Progress Notes (Signed)
Carelink Summary Report / Loop Recorder 

## 2018-01-02 LAB — CUP PACEART REMOTE DEVICE CHECK
Date Time Interrogation Session: 20191229114036
Implantable Pulse Generator Implant Date: 20160616

## 2018-01-14 LAB — CUP PACEART REMOTE DEVICE CHECK
Date Time Interrogation Session: 20191126094036
Implantable Pulse Generator Implant Date: 20160616

## 2018-01-20 ENCOUNTER — Other Ambulatory Visit: Payer: Self-pay | Admitting: Internal Medicine

## 2018-01-21 ENCOUNTER — Other Ambulatory Visit: Payer: Self-pay | Admitting: Internal Medicine

## 2018-01-21 DIAGNOSIS — K2100 Gastro-esophageal reflux disease with esophagitis, without bleeding: Secondary | ICD-10-CM

## 2018-01-26 ENCOUNTER — Encounter: Payer: Medicare Other | Admitting: Internal Medicine

## 2018-02-05 ENCOUNTER — Other Ambulatory Visit: Payer: Self-pay

## 2018-02-05 DIAGNOSIS — M1000 Idiopathic gout, unspecified site: Secondary | ICD-10-CM

## 2018-02-05 MED ORDER — APIXABAN 2.5 MG PO TABS: 2.5000 mg | ORAL_TABLET | Freq: Two times a day (BID) | ORAL | 0 refills | Status: DC

## 2018-02-06 MED ORDER — MIRTAZAPINE 15 MG PO TABS: 15.0000 mg | ORAL_TABLET | Freq: Every day | ORAL | 1 refills | Status: DC

## 2018-02-06 MED ORDER — OMEPRAZOLE 40 MG PO CPDR: 40.0000 mg | DELAYED_RELEASE_CAPSULE | Freq: Every day | ORAL | 1 refills | Status: DC

## 2018-02-06 NOTE — Addendum Note (Signed)
Addended by: Aviva Signs M on: 02/06/2018 11:32 AM   Modules accepted: Orders

## 2018-02-23 ENCOUNTER — Ambulatory Visit (INDEPENDENT_AMBULATORY_CARE_PROVIDER_SITE_OTHER): Payer: Medicare Other | Admitting: Internal Medicine

## 2018-02-23 ENCOUNTER — Encounter: Payer: Self-pay | Admitting: Internal Medicine

## 2018-02-23 VITALS — BP 120/60 | HR 87 | Ht 64.0 in | Wt 104.0 lb

## 2018-02-23 DIAGNOSIS — I480 Paroxysmal atrial fibrillation: Secondary | ICD-10-CM | POA: Diagnosis not present

## 2018-02-23 MED ORDER — APIXABAN 2.5 MG PO TABS: 2.5000 mg | ORAL_TABLET | Freq: Two times a day (BID) | ORAL | 3 refills | Status: DC

## 2018-02-23 NOTE — Progress Notes (Signed)
HPI Julie Silva returns today for followup. She is a pleasant 85 yo woman with palpitations and tachycardia on cardiac monitoring who had an ILR placedalmost 2 yearsago. In the interim, she has been stable and has lost about 2lbs.She has noise as well on her ILR. Rare palpitations. She has not fallen. She has had one episode of atrial fib and several episodes of noise on her ILR. No syncope.  Allergies  Allergen Reactions  . Codeine Other (See Comments)    Passed out     Current Outpatient Medications  Medication Sig Dispense Refill  . apixaban (ELIQUIS) 2.5 MG TABS tablet Take 1 tablet (2.5 mg total) by mouth 2 (two) times daily. 180 tablet 3  . bimatoprost (LUMIGAN) 0.01 % SOLN Place 1 drop into both eyes at bedtime.    . Catheters MISC Replace catheter twice daily. 180 each 1  . dorzolamide-timolol (COSOPT) 22.3-6.8 MG/ML ophthalmic solution Place 1 drop into both eyes 2 (two) times daily.     . methylPREDNISolone (MEDROL DOSEPAK) 4 MG TBPK tablet Take as directed 21 tablet 0  . mirtazapine (REMERON) 15 MG tablet Take 1 tablet (15 mg total) by mouth at bedtime. 90 tablet 1  . omeprazole (PRILOSEC) 40 MG capsule Take 1 capsule (40 mg total) by mouth daily. 90 capsule 1  . promethazine (PHENERGAN) 12.5 MG tablet Take 1 tablet (12.5 mg total) by mouth every 6 (six) hours as needed for nausea or vomiting. 30 tablet 0  . sulfamethoxazole-trimethoprim (BACTRIM,SEPTRA) 200-40 MG/5ML suspension Take 5 mLs by mouth 3 (three) times a week. 100 mL 3  . traMADol (ULTRAM) 50 MG tablet Take 1 tablet (50 mg total) by mouth every 8 (eight) hours as needed. 30 tablet 0   No current facility-administered medications for this visit.      Past Medical History:  Diagnosis Date  . Anemia   . Arthritis    Osteoporosis  . Bleeds easily [facility])    "father was a hemophiliac" - pt not being bothered in recent years.  . Calcium deposit in bursa of knee 2017  . Cancer ([place])    skin cancer "basal  cell".  . Chicken pox   . Cyst, Baker's knee    left knee- tx. Cortisone injection 05-05-15 in office- "improved pain relief and swelling left ankle and knee"  . Diverticulitis   . Dysrhythmia    Dr. McAlhany-cardiology  . Glaucoma of both eyes   . Hearing loss of both ears    Bilateral ears- hearing aids used- right ear hearing betther than left.  . History of blood transfusion 1957   "lots; most related to my periods"-last 1968 -s/p Hysterectomy  . Migraines    "years ago" (02/04/2013)  . PONV (postoperative nausea and vomiting)   . Self-catheterizes urinary bladder    "daily- retained urine and chronic UTI"  . SVT (supraventricular tachycardia) (HCC)    Dr. Darnell Level. Casondra Gasca-follows "loop recorder left chest"  . Swallowing difficulty    freq occ. mostley liquids  . Urine incontinence   . UTI (urinary tract infection)    Chronic Urinary tract infections- tx Macrobid daily.  . Vertigo     ROS:   All systems reviewed and negative except as noted in the HPI.   Past Surgical History:  Procedure Laterality Date  . APPENDECTOMY  1948  . BLADDER SURGERY  1995   Repair-   . BREAST BIOPSY Bilateral    "both were fine"  . CATARACT EXTRACTION  W/ INTRAOCULAR LENS IMPLANT Left   . CHOLECYSTECTOMY  2014  . COMBINED HYSTERECTOMY ABDOMINAL W/ A&P REPAIR / OOPHORECTOMY    . DILATION AND CURETTAGE OF UTERUS    . EP IMPLANTABLE DEVICE N/A 06/19/2014   Procedure: Loop Recorder Insertion;  Surgeon: Evans Lance, MD;  Location: [facility];  Service: Cardiovascular;  Laterality: N/A;  . EXCISION OF BREAST BIOPSY Right 05/13/2015   Procedure: RIGHT BREAST EXCISIONAL BIOPSY x2;  Surgeon: Armandina Gemma, MD;  Location: [facility];  Service: General;  Laterality: Right;  . TONSILLECTOMY  1946  . TOTAL ABDOMINAL HYSTERECTOMY    . VAGINAL HYSTERECTOMY  1968     Family History  Problem Relation Age of Onset  . Stroke Father   . Cancer - Other Sister        Breast  . Cancer - Other Brother         Throat  . Cancer - Other Sister        Throat  . Cancer - Other Other        Brain     Social History   Socioeconomic History  . Marital status: Widowed    Spouse name: Not on file  . Number of children: 4  . Years of education: 62  . Highest education level: Not on file  Occupational History  . Occupation: Runner, broadcasting/film/video  Social Needs  . Financial resource strain: Not hard at all  . Food insecurity:    Worry: Never true    Inability: Never true  . Transportation needs:    Medical: No    Non-medical: No  Tobacco Use  . Smoking status: Never Smoker  . Smokeless tobacco: Never Used  Substance and Sexual Activity  . Alcohol use: No    Alcohol/week: 0.0 standard drinks  . Drug use: No  . Sexual activity: Never  Lifestyle  . Physical activity:    Days per week: 0 days    Minutes per session: 0 min  . Stress: Not at all  Relationships  . Social connections:    Talks on phone: More than three times a week    Gets together: More than three times a week    Attends religious service: 1 to 4 times per year    Active member of club or organization: Yes    Attends meetings of clubs or organizations: 1 to 4 times per year    Relationship status: Widowed  . Intimate partner violence:    Fear of current or ex partner: Not on file    Emotionally abused: Not on file    Physically abused: Not on file    Forced sexual activity: Not on file  Other Topics Concern  . Not on file  Social History Narrative   Lives alone in a one story home.  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      BP 120/60   Pulse 87   Ht 5\' 4"  (1.626 m)   Wt 104 lb (47.2 kg)   BMI 17.85 kg/m   Physical Exam:  Well appearing NAD HEENT: Unremarkable Neck:  No JVD, no thyromegally Lymphatics:  No adenopathy Back:  No CVA tenderness Lungs:  Clear with no wheezes HEART:  Regular rate rhythm, no murmurs, no rubs, no clicks Abd:  soft, positive bowel sounds, no organomegally, no rebound, no  guarding Ext:  2 plus pulses, no edema, no cyanosis, no clubbing Skin:  No rashes no nodules Neuro:  CN II through XII  intact, motor grossly intact  EKG - nsr  DEVICE  Normal device function.  See PaceArt for details.   Assess/Plan: 1. PAF - she is mostly maintaining NSR. She will continue her current meds. 2. Malnutrition - her weight is down 2 lbs. I have encouraged her not to lose any more. 3. ILR - she has some noise at times on her ILR. I will offer her removal when I see her back though with her advanced age, I suspect she will elect not to have it removed.  Mikle Bosworth.D.

## 2018-02-23 NOTE — Patient Instructions (Addendum)
Medication Instructions:  Your physician recommends that you continue on your current medications as directed. Please refer to the Current Medication list given to you today.  Labwork: You will get lab work today:  BMP and CBC  Testing/Procedures: None ordered.  Follow-Up: Your physician wants you to follow-up in: one year with Dr. Lovena Le.   You will receive a reminder letter in the mail two months in advance. If you don't receive a letter, please call our office to schedule the follow-up appointment.   Any Other Special Instructions Will Be Listed Below (If Applicable).  If you need a refill on your cardiac medications before your next appointment, please call your pharmacy.

## 2018-02-24 ENCOUNTER — Encounter: Payer: Self-pay | Admitting: Internal Medicine

## 2018-02-24 LAB — BASIC METABOLIC PANEL
BUN/Creatinine Ratio: 20 (ref 12–28)
BUN: 17 mg/dL (ref 8–27)
CO2: 24 mmol/L (ref 20–29)
Calcium: 9.1 mg/dL (ref 8.7–10.3)
Chloride: 104 mmol/L (ref 96–106)
Creatinine, Ser: 0.86 mg/dL (ref 0.57–1.00)
GFR calc Af Amer: 70 mL/min/{1.73_m2} (ref >59)
GFR calc non Af Amer: 61 mL/min/{1.73_m2} (ref >59)
Glucose: 92 mg/dL (ref 65–99)
Potassium: 4.3 mmol/L (ref 3.5–5.2)
Sodium: 142 mmol/L (ref 134–144)

## 2018-02-24 LAB — CBC WITH DIFFERENTIAL/PLATELET
Basophils Absolute: 0 10*3/uL (ref 0.0–0.2)
Basos: 0 %
EOS (ABSOLUTE): 0.1 10*3/uL (ref 0.0–0.4)
Eos: 2 %
Hematocrit: 36.2 % (ref 34.0–46.6)
Hemoglobin: 12.4 g/dL (ref 11.1–15.9)
Immature Grans (Abs): 0 10*3/uL (ref 0.0–0.1)
Immature Granulocytes: 0 %
Lymphocytes Absolute: 1.7 10*3/uL (ref 0.7–3.1)
Lymphs: 25 %
MCH: 31.9 pg (ref 26.6–33.0)
MCHC: 34.3 g/dL (ref 31.5–35.7)
MCV: 93 fL (ref 79–97)
Monocytes Absolute: 0.7 10*3/uL (ref 0.1–0.9)
Monocytes: 10 %
Neutrophils Absolute: 4.3 10*3/uL (ref 1.4–7.0)
Neutrophils: 63 %
Platelets: 239 10*3/uL (ref 150–450)
RBC: 3.89 x10E6/uL (ref 3.77–5.28)
RDW: 12.4 % (ref 11.7–15.4)
WBC: 6.9 10*3/uL (ref 3.4–10.8)

## 2018-02-26 LAB — CUP PACEART INCLINIC DEVICE CHECK
Date Time Interrogation Session: 20200221212346
Implantable Pulse Generator Implant Date: 20160616

## 2018-03-02 ENCOUNTER — Encounter: Payer: Self-pay | Admitting: Internal Medicine

## 2018-03-05 ENCOUNTER — Other Ambulatory Visit: Payer: Self-pay | Admitting: Internal Medicine

## 2018-03-05 DIAGNOSIS — N390 Urinary tract infection, site not specified: Principal | ICD-10-CM

## 2018-03-05 DIAGNOSIS — B9620 Unspecified Escherichia coli [E. coli] as the cause of diseases classified elsewhere: Secondary | ICD-10-CM

## 2018-03-05 MED ORDER — SULFAMETHOXAZOLE-TRIMETHOPRIM 200-40 MG/5ML PO SUSP: 5.0000 mL | ORAL | 3 refills | Status: DC

## 2018-03-22 ENCOUNTER — Other Ambulatory Visit: Payer: Self-pay | Admitting: Internal Medicine

## 2018-04-04 ENCOUNTER — Ambulatory Visit: Admit: 2018-04-04 | Payer: Medicare Other | Admitting: Ophthalmology

## 2018-04-04 SURGERY — PHACOEMULSIFICATION, CATARACT, WITH IOL INSERTION
Anesthesia: Topical | Laterality: Right

## 2018-04-15 ENCOUNTER — Other Ambulatory Visit: Payer: Self-pay | Admitting: Internal Medicine

## 2018-04-15 ENCOUNTER — Other Ambulatory Visit: Payer: Self-pay | Admitting: Family

## 2018-04-15 DIAGNOSIS — K2100 Gastro-esophageal reflux disease with esophagitis, without bleeding: Secondary | ICD-10-CM

## 2018-04-16 ENCOUNTER — Other Ambulatory Visit: Payer: Self-pay | Admitting: Internal Medicine

## 2018-04-16 DIAGNOSIS — M549 Dorsalgia, unspecified: Principal | ICD-10-CM

## 2018-04-16 DIAGNOSIS — G8929 Other chronic pain: Secondary | ICD-10-CM

## 2018-04-16 MED ORDER — CYCLOBENZAPRINE HCL 5 MG PO TABS: 2.5000 mg | ORAL_TABLET | Freq: Two times a day (BID) | ORAL | 2 refills | Status: DC | PRN

## 2018-09-02 ENCOUNTER — Other Ambulatory Visit: Payer: Self-pay | Admitting: Internal Medicine

## 2018-09-02 DIAGNOSIS — B9620 Unspecified Escherichia coli [E. coli] as the cause of diseases classified elsewhere: Secondary | ICD-10-CM

## 2018-09-02 DIAGNOSIS — N390 Urinary tract infection, site not specified: Secondary | ICD-10-CM

## 2018-09-12 DIAGNOSIS — H2511 Age-related nuclear cataract, right eye: Secondary | ICD-10-CM | POA: Diagnosis not present

## 2018-09-12 DIAGNOSIS — H43811 Vitreous degeneration, right eye: Secondary | ICD-10-CM | POA: Diagnosis not present

## 2018-09-20 DIAGNOSIS — H2511 Age-related nuclear cataract, right eye: Secondary | ICD-10-CM | POA: Diagnosis not present

## 2018-10-03 DIAGNOSIS — I4891 Unspecified atrial fibrillation: Secondary | ICD-10-CM | POA: Diagnosis not present

## 2018-10-03 DIAGNOSIS — H2511 Age-related nuclear cataract, right eye: Secondary | ICD-10-CM | POA: Diagnosis not present

## 2018-10-04 ENCOUNTER — Other Ambulatory Visit: Payer: Self-pay | Admitting: Internal Medicine

## 2018-10-04 DIAGNOSIS — K2100 Gastro-esophageal reflux disease with esophagitis, without bleeding: Secondary | ICD-10-CM

## 2018-10-09 ENCOUNTER — Encounter: Payer: Self-pay | Admitting: *Deleted

## 2018-10-09 ENCOUNTER — Other Ambulatory Visit: Payer: Self-pay

## 2018-10-12 ENCOUNTER — Other Ambulatory Visit
Admission: RE | Admit: 2018-10-12 | Discharge: 2018-10-12 | Disposition: A | Discharge status: Home / Self Care | Payer: Medicare Other | Source: Ambulatory Visit | Attending: Ophthalmology | Admitting: Ophthalmology

## 2018-10-12 DIAGNOSIS — Z20828 Contact with and (suspected) exposure to other viral communicable diseases: Secondary | ICD-10-CM | POA: Insufficient documentation

## 2018-10-12 DIAGNOSIS — Z01812 Encounter for preprocedural laboratory examination: Secondary | ICD-10-CM | POA: Diagnosis not present

## 2018-10-12 LAB — SARS CORONAVIRUS 2 (TAT 6-24 HRS): SARS Coronavirus 2: NEGATIVE

## 2018-10-12 NOTE — Discharge Instructions (Signed)

## 2018-10-16 ENCOUNTER — Encounter
Admission: RE | Disposition: A | Discharge status: Home / Self Care | Payer: Self-pay | Source: Home / Self Care | Attending: Ophthalmology

## 2018-10-16 ENCOUNTER — Ambulatory Visit: Payer: Medicare Other | Admitting: Anesthesiology

## 2018-10-16 ENCOUNTER — Ambulatory Visit
Admission: RE | Admit: 2018-10-16 | Discharge: 2018-10-16 | Disposition: A | Discharge status: Home / Self Care | Payer: Medicare Other | Attending: Ophthalmology | Admitting: Ophthalmology

## 2018-10-16 ENCOUNTER — Other Ambulatory Visit: Payer: Self-pay

## 2018-10-16 DIAGNOSIS — M1990 Unspecified osteoarthritis, unspecified site: Secondary | ICD-10-CM | POA: Insufficient documentation

## 2018-10-16 DIAGNOSIS — H2511 Age-related nuclear cataract, right eye: Secondary | ICD-10-CM | POA: Diagnosis not present

## 2018-10-16 DIAGNOSIS — Z882 Allergy status to sulfonamides status: Secondary | ICD-10-CM | POA: Diagnosis not present

## 2018-10-16 DIAGNOSIS — Z7901 Long term (current) use of anticoagulants: Secondary | ICD-10-CM | POA: Insufficient documentation

## 2018-10-16 DIAGNOSIS — I4891 Unspecified atrial fibrillation: Secondary | ICD-10-CM | POA: Insufficient documentation

## 2018-10-16 DIAGNOSIS — Z885 Allergy status to narcotic agent status: Secondary | ICD-10-CM | POA: Diagnosis not present

## 2018-10-16 DIAGNOSIS — Z79899 Other long term (current) drug therapy: Secondary | ICD-10-CM | POA: Diagnosis not present

## 2018-10-16 DIAGNOSIS — H25811 Combined forms of age-related cataract, right eye: Secondary | ICD-10-CM | POA: Diagnosis not present

## 2018-10-16 DIAGNOSIS — I10 Essential (primary) hypertension: Secondary | ICD-10-CM | POA: Diagnosis not present

## 2018-10-16 DIAGNOSIS — K219 Gastro-esophageal reflux disease without esophagitis: Secondary | ICD-10-CM | POA: Diagnosis not present

## 2018-10-16 HISTORY — DX: Gout, unspecified: M10.9

## 2018-10-16 HISTORY — PX: CATARACT EXTRACTION W/PHACO: SHX586

## 2018-10-16 HISTORY — DX: Presence of external hearing-aid: Z97.4

## 2018-10-16 HISTORY — DX: History of falling: Z91.81

## 2018-10-16 HISTORY — DX: Gastro-esophageal reflux disease without esophagitis: K21.9

## 2018-10-16 HISTORY — DX: Motion sickness, initial encounter: T75.3XXA

## 2018-10-16 SURGERY — PHACOEMULSIFICATION, CATARACT, WITH IOL INSERTION
Anesthesia: Monitor Anesthesia Care | Site: Eye | Laterality: Right

## 2018-10-16 MED ORDER — BRIMONIDINE TARTRATE-TIMOLOL 0.2-0.5 % OP SOLN
OPHTHALMIC | Status: DC | PRN
  Administered 2018-10-16: 1 [drp] via OPHTHALMIC

## 2018-10-16 MED ORDER — FENTANYL CITRATE (PF) 100 MCG/2ML IJ SOLN
INTRAMUSCULAR | Status: DC | PRN
  Administered 2018-10-16 (×2): 25 ug via INTRAVENOUS
  Administered 2018-10-16: 50 ug via INTRAVENOUS

## 2018-10-16 MED ORDER — LIDOCAINE HCL (PF) 2 % IJ SOLN
INTRAOCULAR | Status: DC | PRN
  Administered 2018-10-16: 1 mL

## 2018-10-16 MED ORDER — MOXIFLOXACIN HCL 0.5 % OP SOLN
OPHTHALMIC | Status: DC | PRN
  Administered 2018-10-16: 0.2 mL via OPHTHALMIC

## 2018-10-16 MED ORDER — TETRACAINE HCL 0.5 % OP SOLN
1.0000 [drp] | OPHTHALMIC | Status: DC | PRN
  Administered 2018-10-16 (×3): 1 [drp] via OPHTHALMIC

## 2018-10-16 MED ORDER — ARMC OPHTHALMIC DILATING DROPS
1.0000 "application " | OPHTHALMIC | Status: DC | PRN
  Administered 2018-10-16 (×3): 1 via OPHTHALMIC

## 2018-10-16 MED ORDER — NA CHONDROIT SULF-NA HYALURON 40-17 MG/ML IO SOLN
INTRAOCULAR | Status: DC | PRN
  Administered 2018-10-16: 1 mL via INTRAOCULAR

## 2018-10-16 MED ORDER — EPINEPHRINE PF 1 MG/ML IJ SOLN
INTRAOCULAR | Status: DC | PRN
  Administered 2018-10-16: 73 mL via OPHTHALMIC

## 2018-10-16 SURGICAL SUPPLY — 21 items
CANNULA ANT/CHMB 27G (MISCELLANEOUS) ×2 IMPLANT
CANNULA ANT/CHMB 27GA (MISCELLANEOUS) ×6 IMPLANT
GLOVE SURG LX 8.0 MICRO (GLOVE) ×2
GLOVE SURG LX STRL 8.0 MICRO (GLOVE) ×1 IMPLANT
GLOVE SURG TRIUMPH 8.0 PF LTX (GLOVE) ×3 IMPLANT
GOWN STRL REUS W/ TWL LRG LVL3 (GOWN DISPOSABLE) ×2 IMPLANT
GOWN STRL REUS W/TWL LRG LVL3 (GOWN DISPOSABLE) ×6
LENS DEVICE MILOOP (CUTTER) ×2 IMPLANT
LENS IOL ACRYSOF IQ 20.5 (Intraocular Lens) ×2 IMPLANT
MARKER SKIN DUAL TIP RULER LAB (MISCELLANEOUS) ×3 IMPLANT
NDL FILTER BLUNT 18X1 1/2 (NEEDLE) ×1 IMPLANT
NDL RETROBULBAR .5 NSTRL (NEEDLE) ×3 IMPLANT
NEEDLE FILTER BLUNT 18X 1/2SAF (NEEDLE) ×2
NEEDLE FILTER BLUNT 18X1 1/2 (NEEDLE) ×1 IMPLANT
PACK EYE AFTER SURG (MISCELLANEOUS) ×3 IMPLANT
PACK OPTHALMIC (MISCELLANEOUS) ×3 IMPLANT
PACK PORFILIO (MISCELLANEOUS) ×3 IMPLANT
SYR 3ML LL SCALE MARK (SYRINGE) ×3 IMPLANT
SYR TB 1ML LUER SLIP (SYRINGE) ×3 IMPLANT
WATER STERILE IRR 250ML POUR (IV SOLUTION) ×3 IMPLANT
WIPE NON LINTING 3.25X3.25 (MISCELLANEOUS) ×3 IMPLANT

## 2018-10-16 NOTE — Anesthesia Postprocedure Evaluation (Signed)
Anesthesia Post Note  Patient: Julie Silva  Procedure(s) Performed: CATARACT EXTRACTION PHACO AND INTRAOCULAR LENS PLACEMENT (IOC)  RIGHT MiLoop diabetes 01:05.8  21.0%  13.77 (Right Eye)  Patient location during evaluation: PACU Anesthesia Type: MAC Level of consciousness: awake and alert Pain management: pain level controlled Vital Signs Assessment: post-procedure vital signs reviewed and stable Respiratory status: spontaneous breathing, nonlabored ventilation, respiratory function stable and patient connected to nasal cannula oxygen Cardiovascular status: stable and blood pressure returned to baseline Postop Assessment: no apparent nausea or vomiting Anesthetic complications: no    Veda Canning

## 2018-10-16 NOTE — Anesthesia Preprocedure Evaluation (Signed)
Anesthesia Evaluation  Patient identified by MRN, date of birth, ID band Patient awake    History of Anesthesia Complications (+) PONV  Airway Mallampati: II  TM Distance: >3 FB     Dental   Pulmonary    breath sounds clear to auscultation       Cardiovascular hypertension, + dysrhythmias Supra Ventricular Tachycardia  Rhythm:Regular Rate:Normal     Neuro/Psych  Headaches,    GI/Hepatic GERD  ,  Endo/Other    Renal/GU      Musculoskeletal  (+) Arthritis ,   Abdominal   Peds  Hematology  (+) anemia ,   Anesthesia Other Findings   Reproductive/Obstetrics                             Anesthesia Physical Anesthesia Plan  ASA: III  Anesthesia Plan: MAC   Post-op Pain Management:    Induction: Intravenous  PONV Risk Score and Plan:   Airway Management Planned:   Additional Equipment:   Intra-op Plan:   Post-operative Plan:   Informed Consent: I have reviewed the patients History and Physical, chart, labs and discussed the procedure including the risks, benefits and alternatives for the proposed anesthesia with the patient or authorized representative who has indicated his/her understanding and acceptance.       Plan Discussed with: CRNA  Anesthesia Plan Comments:         Anesthesia Quick Evaluation

## 2018-10-16 NOTE — H&P (Signed)
All labs reviewed. Abnormal studies sent to patients PCP when indicated.  Previous H&P reviewed, patient examined, there are NO CHANGES.  Julie Vanallen Porfilio10/13/20207:52 AM

## 2018-10-16 NOTE — Transfer of Care (Signed)
Immediate Anesthesia Transfer of Care Note  Patient: Julie Silva  Procedure(s) Performed: CATARACT EXTRACTION PHACO AND INTRAOCULAR LENS PLACEMENT (IOC)  RIGHT MiLoop diabetes 01:05.8  21.0%  13.77 (Right Eye)  Patient Location: PACU  Anesthesia Type: MAC  Level of Consciousness: awake, alert  and patient cooperative  Airway and Oxygen Therapy: Patient Spontanous Breathing and Patient connected to supplemental oxygen  Post-op Assessment: Post-op Vital signs reviewed, Patient's Cardiovascular Status Stable, Respiratory Function Stable, Patent Airway and No signs of Nausea or vomiting  Post-op Vital Signs: Reviewed and stable  Complications: No apparent anesthesia complications

## 2018-10-16 NOTE — Op Note (Signed)
PREOPERATIVE DIAGNOSIS:  Nuclear sclerotic cataract of the right eye.   POSTOPERATIVE DIAGNOSIS:  H25.11 Cataract   OPERATIVE PROCEDURE:@   SURGEON:  Birder Robson, MD.   ANESTHESIA:  Anesthesiologist: Veda Canning, MD CRNA: Jeannene Patella, CRNA  1.      Managed anesthesia care. 2.      0.60ml of Shugarcaine was instilled in the eye following the paracentesis.   COMPLICATIONS:  none   TECHNIQUE:   Stop and chop. Mi Loop device    DESCRIPTION OF PROCEDURE:  The patient was examined and consented in the preoperative holding area where the aforementioned topical anesthesia was applied to the right eye and then brought back to the Operating Room where the right eye was prepped and draped in the usual sterile ophthalmic fashion and a lid speculum was placed. A paracentesis was created with the side port blade and the anterior chamber was filled with viscoelastic. A near clear corneal incision was performed with the steel keratome. A continuous curvilinear capsulorrhexis was performed with a cystotome followed by the capsulorrhexis forceps. Hydrodissection and hydrodelineation were carried out with BSS on a blunt cannula. The lens was removed in a stop and chop  technique and the remaining cortical material was removed with the irrigation-aspiration handpiece. The capsular bag was inflated with viscoelastic and the Technis [id number]  lens was placed in the capsular bag without complication. The remaining viscoelastic was removed from the eye with the irrigation-aspiration handpiece. The wounds were hydrated. The anterior chamber was flushed with BSS and the eye was inflated to physiologic pressure. 0.84ml of Vigamox was placed in the anterior chamber. The wounds were found to be water tight. The eye was dressed with Combigan. The patient was given protective glasses to wear throughout the day and a shield with which to sleep tonight. The patient was also given drops with which to begin a drop  regimen today and will follow-up with me in one day. Implant Name Type Inv. Item Serial No. Manufacturer Lot No. LRB No. Used Action  LENS IOL ACRYSOF IQ 20.5 - [id number] Intraocular Lens LENS IOL ACRYSOF IQ 20.5 [id number] ALCON  Right 1 Implanted   Procedure(s) with comments: CATARACT EXTRACTION PHACO AND INTRAOCULAR LENS PLACEMENT (IOC)  RIGHT MiLoop diabetes 01:05.8  21.0%  13.77 (Right) - BS tends to drop in AM and would like early surgery so she can get home to eat, please  Electronically signed: Birder Robson 10/16/2018 8:27 AM

## 2018-10-17 ENCOUNTER — Encounter: Payer: Self-pay | Admitting: Ophthalmology

## 2018-12-11 ENCOUNTER — Encounter: Payer: Self-pay | Admitting: Internal Medicine

## 2018-12-11 ENCOUNTER — Other Ambulatory Visit: Payer: Self-pay

## 2018-12-11 ENCOUNTER — Ambulatory Visit: Payer: Self-pay

## 2018-12-11 ENCOUNTER — Ambulatory Visit (INDEPENDENT_AMBULATORY_CARE_PROVIDER_SITE_OTHER)
Admission: RE | Admit: 2018-12-11 | Discharge: 2018-12-11 | Disposition: A | Discharge status: Home / Self Care | Payer: Medicare Other | Source: Ambulatory Visit | Attending: Internal Medicine | Admitting: Internal Medicine

## 2018-12-11 ENCOUNTER — Other Ambulatory Visit (INDEPENDENT_AMBULATORY_CARE_PROVIDER_SITE_OTHER): Payer: Medicare Other

## 2018-12-11 ENCOUNTER — Ambulatory Visit (INDEPENDENT_AMBULATORY_CARE_PROVIDER_SITE_OTHER): Payer: Medicare Other | Admitting: Internal Medicine

## 2018-12-11 VITALS — BP 164/80 | HR 83 | Temp 97.7°F | Resp 16 | Ht 64.0 in | Wt 104.0 lb

## 2018-12-11 DIAGNOSIS — I10 Essential (primary) hypertension: Secondary | ICD-10-CM | POA: Diagnosis not present

## 2018-12-11 DIAGNOSIS — R059 Cough, unspecified: Secondary | ICD-10-CM

## 2018-12-11 DIAGNOSIS — R0789 Other chest pain: Secondary | ICD-10-CM

## 2018-12-11 DIAGNOSIS — R05 Cough: Secondary | ICD-10-CM | POA: Diagnosis not present

## 2018-12-11 DIAGNOSIS — B9620 Unspecified Escherichia coli [E. coli] as the cause of diseases classified elsewhere: Secondary | ICD-10-CM

## 2018-12-11 DIAGNOSIS — Z23 Encounter for immunization: Secondary | ICD-10-CM | POA: Diagnosis not present

## 2018-12-11 DIAGNOSIS — M1189 Other specified crystal arthropathies, multiple sites: Secondary | ICD-10-CM

## 2018-12-11 DIAGNOSIS — N390 Urinary tract infection, site not specified: Secondary | ICD-10-CM

## 2018-12-11 LAB — CBC WITH DIFFERENTIAL/PLATELET
Basophils Absolute: 0 10*3/uL (ref 0.0–0.1)
Basophils Relative: 0.4 % (ref 0.0–3.0)
Eosinophils Absolute: 0.1 10*3/uL (ref 0.0–0.7)
Eosinophils Relative: 1.7 % (ref 0.0–5.0)
HCT: 38.2 % (ref 36.0–46.0)
Hemoglobin: 12.4 g/dL (ref 12.0–15.0)
Lymphocytes Relative: 18.9 % (ref 12.0–46.0)
Lymphs Abs: 1.3 10*3/uL (ref 0.7–4.0)
MCHC: 32.3 g/dL (ref 30.0–36.0)
MCV: 94.9 fl (ref 78.0–100.0)
Monocytes Absolute: 0.7 10*3/uL (ref 0.1–1.0)
Monocytes Relative: 9.2 % (ref 3.0–12.0)
Neutro Abs: 5 10*3/uL (ref 1.4–7.7)
Neutrophils Relative %: 69.8 % (ref 43.0–77.0)
Platelets: 246 10*3/uL (ref 150.0–400.0)
RBC: 4.03 Mil/uL (ref 3.87–5.11)
RDW: 13.5 % (ref 11.5–15.5)
WBC: 7.1 10*3/uL (ref 4.0–10.5)

## 2018-12-11 LAB — URINALYSIS, ROUTINE W REFLEX MICROSCOPIC
Bilirubin Urine: NEGATIVE
Ketones, ur: NEGATIVE
Nitrite: NEGATIVE
Specific Gravity, Urine: 1.015 (ref 1.000–1.030)
Total Protein, Urine: NEGATIVE
Urine Glucose: NEGATIVE
Urobilinogen, UA: 0.2 (ref 0.0–1.0)
pH: 6.5 (ref 5.0–8.0)

## 2018-12-11 LAB — BASIC METABOLIC PANEL
BUN: 18 mg/dL (ref 6–23)
CO2: 26 mEq/L (ref 19–32)
Calcium: 9.3 mg/dL (ref 8.4–10.5)
Chloride: 104 mEq/L (ref 96–112)
Creatinine, Ser: 0.78 mg/dL (ref 0.40–1.20)
GFR: 69.46 mL/min (ref >60.00)
Glucose, Bld: 93 mg/dL (ref 70–99)
Potassium: 3.9 mEq/L (ref 3.5–5.1)
Sodium: 139 mEq/L (ref 135–145)

## 2018-12-11 LAB — TROPONIN I (HIGH SENSITIVITY): High Sens Troponin I: 7 ng/L (ref 2–17)

## 2018-12-11 MED ORDER — METHYLPREDNISOLONE 4 MG PO TBPK: ORAL_TABLET | ORAL | 0 refills | Status: DC

## 2018-12-11 NOTE — Telephone Encounter (Signed)
Patient's daughter called stating that her mother started to Complain on Saturday of difficulty feeling like she can take a deep breath. She states that when she tries she coughs. She has no other symptom per daughter. No fever No sinus issue.  She states that her mother fell on a pile of pillows Saturday but she has already been having this off and on complaint about talking an deep breath. Per daughter her mother had been dx with a heat arrhythmia and has seen a Pulmonologist for lung issues.  She states her mom had on loose stool yesterday but had not taken her probiotic so this is not an unusual symptom. She often has GI issues. No further issue today. Daughter states that she wears an N95 when with her mother and she has had no visitor over the holiday. Care advice read to daughter. She verbalized understanding. Call transferred to office for scheduling. Daughter is requesting visit to office. Office is aware.  Reason for Disposition . [1] MILD difficulty breathing (e.g., minimal/no SOB at rest, SOB with walking, pulse <100) AND [2] NEW-onset or WORSE than normal  Answer Assessment - Initial Assessment Questions 1. RESPIRATORY STATUS: "Describe your breathing?" (e.g., wheezing, shortness of breath, unable to speak, severe coughing)      Trouble taking deep breath 2. ONSET: "When did this breathing problem begin?"      saturday 3. PATTERN "Does the difficult breathing come and go, or has it been constant since it started?"      Comes and goes 4. SEVERITY: "How bad is your breathing?" (e.g., mild, moderate, severe)    - MILD: No SOB at rest, mild SOB with walking, speaks normally in sentences, can lay down, no retractions, pulse < 100.    - MODERATE: SOB at rest, SOB with minimal exertion and prefers to sit, cannot lie down flat, speaks in phrases, mild retractions, audible wheezing, pulse 100-120.    - SEVERE: Very SOB at rest, speaks in single words, struggling to breathe, sitting hunched  forward, retractions, pulse > 120      mild 5. RECURRENT SYMPTOM: "Have you had difficulty breathing before?" If so, ask: "When was the last time?" and "What happened that time?"      yes 6. CARDIAC HISTORY: "Do you have any history of heart disease?" (e.g., heart attack, angina, bypass surgery, angioplasty)      Tachycardia has loop monitor 7. LUNG HISTORY: "Do you have any history of lung disease?"  (e.g., pulmonary embolus, asthma, emphysema)     inflamation from antibiotic and lung Dx 8. CAUSE: "What do you think is causing the breathing problem?"      nothing 9. OTHER SYMPTOMS: "Do you have any other symptoms? (e.g., dizziness, runny nose, cough, chest pain, fever)     Cough when she takes a deep breath, had fall on Saturday trip and fell on pillows 10. PREGNANCY: "Is there any chance you are pregnant?" "When was your last menstrual period?"       N/A 11. TRAVEL: "Have you traveled out of the country in the last month?" (e.g., travel history, exposures)       no  Protocols used: BREATHING DIFFICULTY-A-AH

## 2018-12-11 NOTE — Progress Notes (Signed)
Subjective:  Patient ID: Julie Silva, female    DOB: 07-Sep-1927  Age: 85 y.o. MRN: [id number]  CC: Cough  This visit occurred during the SARS-CoV-2 public health emergency.  Safety protocols were in place, including screening questions prior to the visit, additional usage of staff PPE, and extensive cleaning of exam room while observing appropriate contact time as indicated for disinfecting solutions.    HPI Julie Silva presents for f/up - She tells me she has been feeling poorly for about 2 weeks.  She describes a nonproductive cough, shortness of breath, chest pressure, dyspnea on exertion, and the sensation that she cannot get a deep, satisfying breath.  She denies hemoptysis, fever, chills, diaphoresis, dizziness, lightheadedness, palpitations, edema, or fatigue.  Outpatient Medications Prior to Visit  Medication Sig Dispense Refill  . apixaban (ELIQUIS) 2.5 MG TABS tablet Take 1 tablet (2.5 mg total) by mouth 2 (two) times daily. 180 tablet 3  . bimatoprost (LUMIGAN) 0.01 % SOLN Place 1 drop into both eyes at bedtime.    . Catheters MISC Replace catheter twice daily. 180 each 1  . cyclobenzaprine (FLEXERIL) 5 MG tablet Take 0.5 tablets (2.5 mg total) by mouth 2 (two) times daily as needed for muscle spasms. 30 tablet 2  . dorzolamide-timolol (COSOPT) 22.3-6.8 MG/ML ophthalmic solution Place 1 drop into both eyes 2 (two) times daily.     . mirtazapine (REMERON) 15 MG tablet TAKE 1 TABLET AT BEDTIME 90 tablet 1  . Multiple Vitamins-Minerals (PRESERVISION AREDS 2 PO) Take by mouth 2 (two) times daily.    Marland Kitchen omeprazole (PRILOSEC) 40 MG capsule TAKE 1 CAPSULE DAILY 90 capsule 1  . Probiotic Product (PROBIOTIC PO) Take by mouth daily.    . promethazine (PHENERGAN) 12.5 MG tablet TAKE 1 TABLET (12.5 MG TOTAL) BY MOUTH EVERY 6 (SIX) HOURS AS NEEDED FOR NAUSEA OR VOMITING. 30 tablet 0  . sulfamethoxazole-trimethoprim (BACTRIM) 200-40 MG/5ML suspension TAKE 5 MLS BY MOUTH 3 (THREE) TIMES A  WEEK. 100 mL 1   No facility-administered medications prior to visit.     ROS Review of Systems  Constitutional: Negative.  Negative for appetite change, chills, diaphoresis, fatigue and fever.  HENT: Negative.  Negative for trouble swallowing.   Eyes: Negative.   Respiratory: Positive for cough and shortness of breath. Negative for choking and chest tightness.   Cardiovascular: Positive for chest pain. Negative for palpitations and leg swelling.  Gastrointestinal: Negative for abdominal pain, constipation, diarrhea, nausea and vomiting.  Endocrine: Negative.   Genitourinary: Positive for dysuria. Negative for decreased urine volume, difficulty urinating, frequency, hematuria and urgency.       She complains that her urine is dark and cloudy  Musculoskeletal: Negative.  Negative for arthralgias and myalgias.  Skin: Negative.  Negative for color change and pallor.  Neurological: Negative.  Negative for dizziness, weakness, light-headedness and headaches.  Hematological: Negative.  Negative for adenopathy. Does not bruise/bleed easily.  Psychiatric/Behavioral: Negative.     Objective:  BP (!) 164/80 (BP Location: Left Arm, Patient Position: Sitting, Cuff Size: Normal)   Pulse 83   Temp 97.7 F (36.5 C) (Oral)   Resp 16   Ht 5\' 4"  (1.626 m)   Wt 104 lb (47.2 kg)   SpO2 99%   BMI 17.85 kg/m   BP Readings from Last 3 Encounters:  12/11/18 (!) 164/80  10/16/18 124/78  02/23/18 120/60    Wt Readings from Last 3 Encounters:  12/11/18 104 lb (47.2 kg)  10/16/18 105 lb  6.4 oz (47.8 kg)  02/23/18 104 lb (47.2 kg)    Physical Exam Vitals reviewed.  Constitutional:      General: She is not in acute distress.    Appearance: She is not ill-appearing, toxic-appearing or diaphoretic.  HENT:     Nose: Nose normal.     Mouth/Throat:     Mouth: Mucous membranes are moist.  Eyes:     General: No scleral icterus.    Conjunctiva/sclera: Conjunctivae normal.  Cardiovascular:      Rate and Rhythm: Normal rate and regular rhythm.     Heart sounds: Normal heart sounds, S1 normal and S2 normal. No friction rub. No gallop.      Comments: EKG --- Sinus  Rhythm  WITHIN NORMAL LIMITS   Pulmonary:     Effort: Pulmonary effort is normal. No respiratory distress.     Breath sounds: No stridor. No wheezing, rhonchi or rales.  Chest:     Chest wall: No tenderness.  Abdominal:     General: Abdomen is flat. Bowel sounds are normal. There is no distension.     Palpations: There is no hepatomegaly, splenomegaly or mass.     Tenderness: There is no abdominal tenderness. There is no guarding.  Musculoskeletal:     Cervical back: Neck supple.     Right lower leg: No edema.     Left lower leg: No edema.  Lymphadenopathy:     Cervical: No cervical adenopathy.  Skin:    General: Skin is warm and dry.  Neurological:     General: No focal deficit present.     Mental Status: She is alert.  Psychiatric:        Mood and Affect: Mood normal.        Behavior: Behavior normal.     Lab Results  Component Value Date   WBC 6.9 02/23/2018   HGB 12.4 02/23/2018   HCT 36.2 02/23/2018   PLT 239 02/23/2018   GLUCOSE 92 02/23/2018   CHOL 168 04/04/2016   TRIG 105.0 04/04/2016   HDL 58.10 04/04/2016   LDLCALC 89 04/04/2016   ALT 17 08/16/2016   AST 21 08/16/2016   NA 142 02/23/2018   K 4.3 02/23/2018   CL 104 02/23/2018   CREATININE 0.86 02/23/2018   BUN 17 02/23/2018   CO2 24 02/23/2018   TSH 3.69 04/04/2016   INR 0.9 07/03/2012   HGBA1C 5.5 05/07/2013    Dg Chest 2 View  Result Date: 12/11/2018 CLINICAL DATA:  Cough and chest pain for 1 week. EXAM: CHEST - 2 VIEW COMPARISON:  04/04/2016 and prior radiographs FINDINGS: The cardiomediastinal silhouette is unremarkable. Reticulonodular opacities within both mid lungs are not significantly changed from remote studies. Scarring changes within the RIGHT LOWER lung noted. There is no evidence of focal airspace disease, pulmonary  edema, suspicious pulmonary nodule/mass, pleural effusion, or pneumothorax. No acute bony abnormalities are identified. IMPRESSION: 1. No evidence of acute cardiopulmonary disease. 2. Unchanged reticulonodular opacities within both mid lungs which may represent chronic infection/inflammation. Electronically Signed   By: Margarette Canada M.D.   On: 12/11/2018 15:24    Assessment & Plan:   Mamye was seen today for cough.  Diagnoses and all orders for this visit:  Cough- Her chest x-ray is unchanged.  There are no signs of infection or malignancy. -     DG Chest 2 View; Future  Sensation of chest pressure- Evaluation for cardiac ischemia is negative.  Screening for pulmonary embolus is negative.  I think her risk symptoms are related to stress and anxiety.  She will let me know if she develops any new or worsening symptoms. -     CBC with Differential; Future -     Troponin I (High Sensitivity); Future -     D-dimer, quantitative; Future -     DG Chest 2 View; Future -     EKG 12-Lead  Essential hypertension, benign- Considering her age and comorbid illnesses her blood pressure is adequately well controlled. -     CBC with Differential; Future -     Basic metabolic panel; Future  E. coli UTI (urinary tract infection)- I will recheck her UA and urine culture and will treat if indicated. -     Urinalysis, Routine w reflex microscopic; Future -     Urine Culture Comprehensive; Future  Pseudogout involving multiple joints- She does not currently have gout symptoms but she wants to keep prednisone at home in the event that she develops a flareup of gouty arthropathy. -     methylPREDNISolone (MEDROL DOSEPAK) 4 MG TBPK tablet; TAKE AS DIRECTED  Need for influenza vaccination -     Flu Vaccine QUAD High Dose(Fluad)   I am having Julie Silva start on methylPREDNISolone. I am also having her maintain her bimatoprost, dorzolamide-timolol, Catheters, apixaban, promethazine, cyclobenzaprine,  sulfamethoxazole-trimethoprim, omeprazole, mirtazapine, Multiple Vitamins-Minerals (PRESERVISION AREDS 2 PO), and Probiotic Product (PROBIOTIC PO).  Meds ordered this encounter  Medications  . methylPREDNISolone (MEDROL DOSEPAK) 4 MG TBPK tablet    Sig: TAKE AS DIRECTED    Dispense:  21 tablet    Refill:  0     Follow-up: Return if symptoms worsen or fail to improve.  Scarlette Calico, MD

## 2018-12-11 NOTE — Patient Instructions (Signed)

## 2018-12-13 LAB — D-DIMER, QUANTITATIVE: D-Dimer, Quant: 0.65 mcg/mL FEU — ABNORMAL HIGH (ref <0.50)

## 2018-12-13 LAB — CULTURE, URINE COMPREHENSIVE
MICRO NUMBER:: 1175725
SPECIMEN QUALITY:: ADEQUATE

## 2018-12-14 ENCOUNTER — Encounter: Payer: Self-pay | Admitting: Internal Medicine

## 2018-12-21 ENCOUNTER — Other Ambulatory Visit: Payer: Self-pay | Admitting: Internal Medicine

## 2018-12-21 DIAGNOSIS — N390 Urinary tract infection, site not specified: Secondary | ICD-10-CM

## 2018-12-21 DIAGNOSIS — B9620 Unspecified Escherichia coli [E. coli] as the cause of diseases classified elsewhere: Secondary | ICD-10-CM

## 2019-01-28 IMAGING — CT CT CERVICAL SPINE W/O CM
1 of 8 series · 3 of 33 positions shown, 4 images · non-contrast
Comparison: MRI of the brain performed 09/12/2006

CLINICAL DATA: Status post fall, hitting back of head on floor.
Lump on the back of the head. Concern for cervical spine injury.
Initial encounter.

EXAM:
CT HEAD WITHOUT CONTRAST
CT CERVICAL SPINE WITHOUT CONTRAST
TECHNIQUE: Multidetector CT imaging of the head and cervical spine was
performed following the standard protocol without intravenous
contrast. Multiplanar CT image reconstructions of the cervical spine
were also generated.

[Series 307: orthoganals · axial · 0.25mm/px · z∈[+1026,+1112]mm · 3 of 89 slices shown, 4 images]
[im 23/89  soft-tissue]
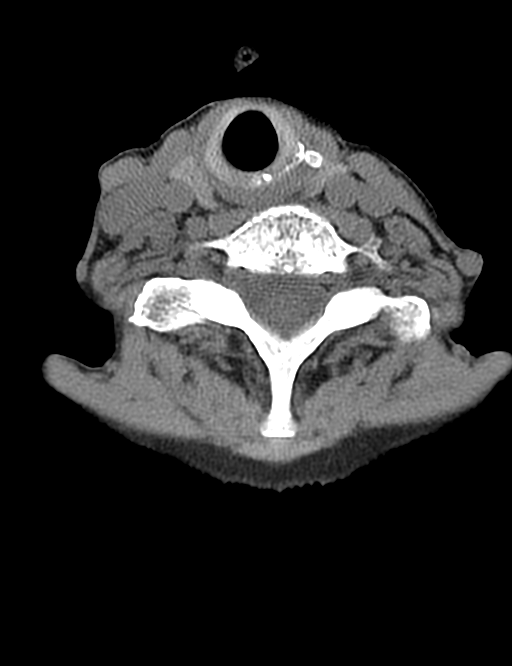
[im 23/89  bone]
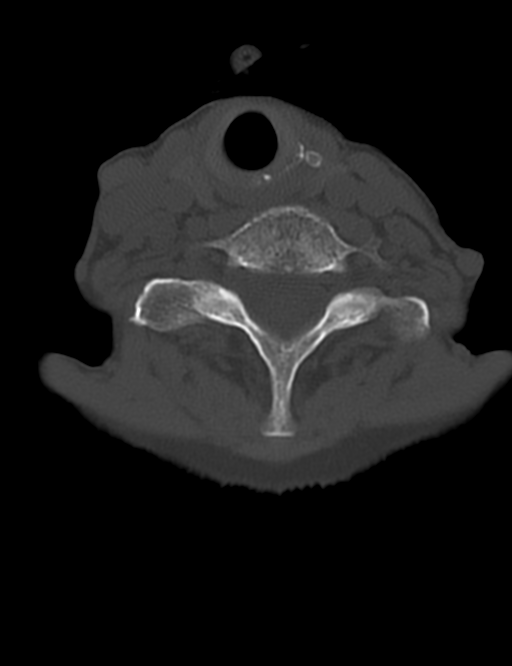
[im 45/89  bone]
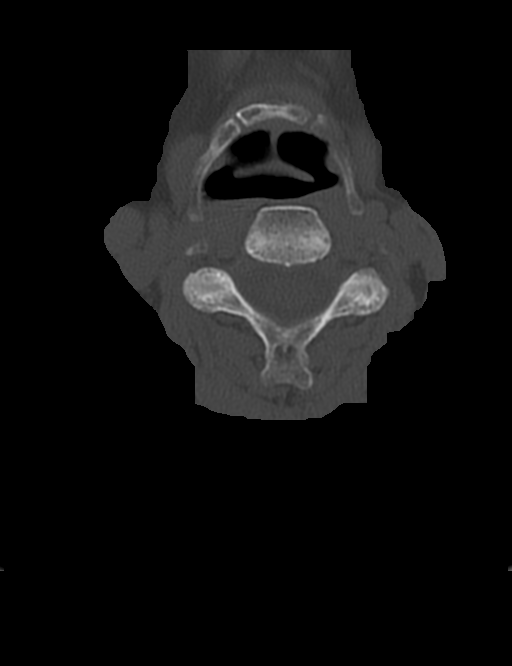
[im 67/89  bone]
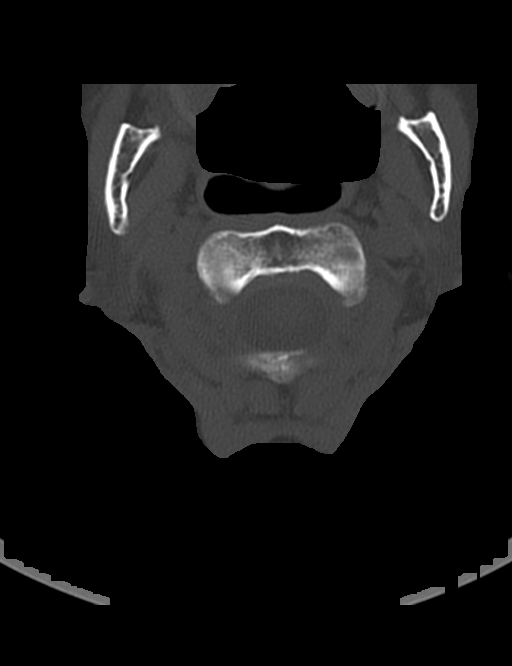

[3 of 33 positions shown; findings below may reference images not displayed]

FINDINGS: CT HEAD FINDINGS

Brain: No evidence of acute infarction, hemorrhage, hydrocephalus,
extra-axial collection or mass lesion/mass effect.

Prominence of the ventricles and sulci reflects mild cortical volume
loss. Mild cerebellar atrophy is noted. Scattered periventricular
white matter change likely reflects small vessel ischemic
microangiopathy.

The brainstem and fourth ventricle are within normal limits. The
basal ganglia are unremarkable in appearance. The cerebral
hemispheres demonstrate grossly normal gray-white differentiation.
No mass effect or midline shift is seen.

Vascular: No hyperdense vessel or unexpected calcification.

Skull: There is no evidence of fracture; visualized osseous
structures are unremarkable in appearance.

Sinuses/Orbits: The visualized portions of the orbits are within
normal limits. The paranasal sinuses and mastoid air cells are
well-aerated.

Other: Mild soft tissue swelling is noted at the right posterior
vertex.

CT CERVICAL SPINE FINDINGS

Alignment: Normal.

Skull base and vertebrae: No acute fracture. No primary bone lesion
or focal pathologic process.

Soft tissues and spinal canal: No prevertebral fluid or swelling. No
visible canal hematoma.

Disc levels: Disc space narrowing is noted at C5-C6, with scattered
anterior and posterior disc osteophyte complexes

Upper chest: Scarring is noted at the lung apices. The thyroid gland
is unremarkable in appearance.

Other: No additional soft tissue abnormalities are seen.
IMPRESSION: 1. No evidence of traumatic intracranial injury or fracture.
2. No evidence of fracture or subluxation along the cervical spine.
3. Mild soft tissue swelling at the right posterior vertex.
4. Mild cortical volume loss and scattered small vessel ischemic
microangiopathy.
5. Mild degenerative change at the lower cervical spine.
6. Scarring at the lung apices.

## 2019-02-17 ENCOUNTER — Ambulatory Visit: Payer: Medicare Other

## 2019-03-24 ENCOUNTER — Other Ambulatory Visit: Payer: Self-pay | Admitting: Internal Medicine

## 2019-03-24 DIAGNOSIS — B9620 Unspecified Escherichia coli [E. coli] as the cause of diseases classified elsewhere: Secondary | ICD-10-CM

## 2019-04-02 ENCOUNTER — Other Ambulatory Visit: Payer: Self-pay | Admitting: Internal Medicine

## 2019-04-02 DIAGNOSIS — K2100 Gastro-esophageal reflux disease with esophagitis, without bleeding: Secondary | ICD-10-CM

## 2019-04-10 ENCOUNTER — Encounter: Payer: Self-pay | Admitting: Internal Medicine

## 2019-04-23 ENCOUNTER — Other Ambulatory Visit: Payer: Self-pay | Admitting: Internal Medicine

## 2019-04-23 DIAGNOSIS — R110 Nausea: Secondary | ICD-10-CM | POA: Diagnosis not present

## 2019-04-23 DIAGNOSIS — R0600 Dyspnea, unspecified: Secondary | ICD-10-CM | POA: Diagnosis not present

## 2019-04-23 DIAGNOSIS — R0609 Other forms of dyspnea: Secondary | ICD-10-CM | POA: Diagnosis not present

## 2019-04-23 DIAGNOSIS — Z7901 Long term (current) use of anticoagulants: Secondary | ICD-10-CM | POA: Diagnosis not present

## 2019-04-23 DIAGNOSIS — R0789 Other chest pain: Secondary | ICD-10-CM | POA: Diagnosis not present

## 2019-04-23 DIAGNOSIS — Z79899 Other long term (current) drug therapy: Secondary | ICD-10-CM | POA: Diagnosis not present

## 2019-04-23 DIAGNOSIS — R9431 Abnormal electrocardiogram [ECG] [EKG]: Secondary | ICD-10-CM | POA: Diagnosis not present

## 2019-04-23 DIAGNOSIS — I081 Rheumatic disorders of both mitral and tricuspid valves: Secondary | ICD-10-CM | POA: Diagnosis not present

## 2019-04-23 DIAGNOSIS — R0601 Orthopnea: Secondary | ICD-10-CM | POA: Diagnosis not present

## 2019-04-23 DIAGNOSIS — R531 Weakness: Secondary | ICD-10-CM | POA: Diagnosis not present

## 2019-04-23 DIAGNOSIS — R634 Abnormal weight loss: Secondary | ICD-10-CM | POA: Diagnosis not present

## 2019-04-23 DIAGNOSIS — D649 Anemia, unspecified: Secondary | ICD-10-CM | POA: Diagnosis not present

## 2019-04-23 DIAGNOSIS — I5189 Other ill-defined heart diseases: Secondary | ICD-10-CM | POA: Diagnosis not present

## 2019-04-23 DIAGNOSIS — Z681 Body mass index (BMI) 19 or less, adult: Secondary | ICD-10-CM | POA: Diagnosis not present

## 2019-04-23 DIAGNOSIS — I480 Paroxysmal atrial fibrillation: Secondary | ICD-10-CM | POA: Diagnosis not present

## 2019-04-23 NOTE — Telephone Encounter (Addendum)
Prescription refill request for Eliquis received.  Last office visit: 2/21/2020Lovena Le Scr: 0.78, 12/11/2018 Age: 86 y.o. Weight: 47.2 kg   Pt overdue for an office visit. Called and spoke to pt's daughter, Rosiland Oz. Informed Pat that to continue to refill pt's Eliquis pt will need to schedule an appointment with cardiologist. [name] stated that she would have to call back to schedule an appointment, once she gets to her office. Will refill Eliquis with a 3 month supply ( mail order) so pt dose not run out. Daughter instructed to call office back to schedule office visit.

## 2019-05-09 DIAGNOSIS — I480 Paroxysmal atrial fibrillation: Secondary | ICD-10-CM | POA: Diagnosis not present

## 2019-05-13 ENCOUNTER — Encounter: Payer: Self-pay | Admitting: *Deleted

## 2019-05-21 ENCOUNTER — Ambulatory Visit (INDEPENDENT_AMBULATORY_CARE_PROVIDER_SITE_OTHER): Payer: Medicare Other | Admitting: Internal Medicine

## 2019-05-21 ENCOUNTER — Encounter: Payer: Self-pay | Admitting: Internal Medicine

## 2019-05-21 VITALS — BP 123/77 | HR 75 | Ht 64.0 in | Wt 93.4 lb

## 2019-05-21 DIAGNOSIS — R197 Diarrhea, unspecified: Secondary | ICD-10-CM | POA: Diagnosis not present

## 2019-05-21 DIAGNOSIS — R634 Abnormal weight loss: Secondary | ICD-10-CM | POA: Diagnosis not present

## 2019-05-21 DIAGNOSIS — Z8719 Personal history of other diseases of the digestive system: Secondary | ICD-10-CM

## 2019-05-21 DIAGNOSIS — K5730 Diverticulosis of large intestine without perforation or abscess without bleeding: Secondary | ICD-10-CM | POA: Diagnosis not present

## 2019-05-21 NOTE — Patient Instructions (Signed)
Continue Florastor.  Please purchase the following medications over the counter and take as directed: Benefiber 2 teaspoons daily.  Continue omeprazole as directed.  If you are age 84 or older, your body mass index should be between 23-30. Your Body mass index is 16.03 kg/m. If this is out of the aforementioned range listed, please consider follow up with your Primary Care Provider.  If you are age 13 or younger, your body mass index should be between 19-25. Your Body mass index is 16.03 kg/m. If this is out of the aformentioned range listed, please consider follow up with your Primary Care Provider.   Due to recent changes in healthcare laws, you may see the results of your imaging and laboratory studies on MyChart before your provider has had a chance to review them.  We understand that in some cases there may be results that are confusing or concerning to you. Not all laboratory results come back in the same time frame and the provider may be waiting for multiple results in order to interpret others.  Please give [place] 48 hours in order for your provider to thoroughly review all the results before contacting the office for clarification of your results.

## 2019-05-21 NOTE — Progress Notes (Signed)
Patient ID: Julie Silva, female   DOB: 03-06-1927, 86 y.o.   MRN: [id number] HPI: Rashanti Kujath is an 86 year old female with a past medical history of GERD, diverticulosis, atrial fibrillation on Eliquis, diastolic dysfunction, glaucoma, chronic urinary incontinence using an in and out cath on chronic suppressive Bactrim who is seen to discuss intermittent episodes of diarrhea.  She is here today with her daughter.  She reports that she has had episodes of explosive and uncontrollable diarrhea.  These are associated with urgency and bowel incontinence.  Stools are not bloody or melenic and symptoms seem to start fairly abruptly.  She feels wiped out after these episodes despite the fact that diarrhea usually only occurs once or twice on 1 day before resolving.  However for 3 to 4 days after she will feel wiped out with decreased appetite.  This last occurred the day after Easter 2021.  Outside of these episodes her bowel movements usually occur once per morning and are formed.  She has had issues with slow weight loss having weight about 105 pounds a year ago, she now weighs 93 pounds.  Her appetite while present is not as strong as it used to be.  She reports that she will occasionally feel full quickly.  Primary care put her on omeprazole and she takes 40 mg a day.  She is able to eat soft foods like potatoes and waffles.  She likes ice cream and peanut butter.  She does take Florastor 250 mg a day for the past 18 months.  She supplements with a boost every day.  She previously took chronic suppressive nitrofurantoin but developed lung inflammation and this was switched to Bactrim.  She had prior colonoscopy in 2001 with Dr. Roxy Manns at [facility].  She had a colonoscopy with Dr. Redmond Pulling in 2006 and a flexible sigmoidoscopy in 2007.  They do not recall a prior history of polyp but they do recall history of diverticulosis.  I have cross-sectional imaging from January 2015 with CT scan of the abdomen and pelvis  without contrast.  This showed normal stomach, normal small bowel normal cecum.  No secondary signs of appendicitis.  Multiple diverticula through the sigmoid region without acute inflammation.    Past Medical History:  Diagnosis Date  . Anemia   . Arthritis    Osteoporosis, neck stiffness  . Atrial fibrillation ([place])   . Bleeds easily [facility])    "father was a hemophiliac" - pt not being bothered in recent years.  . Calcium deposit in bursa of knee 2017  . Cancer ([place])    skin cancer "basal cell".  . Chicken pox   . Cyst, Baker's knee    left knee- tx. Cortisone injection 05-05-15 in office- "improved pain relief and swelling left ankle and knee"  . Diverticulitis   . Dysrhythmia    Dr. McAlhany-cardiology  . GERD (gastroesophageal reflux disease)   . Glaucoma of both eyes   . Gout   . Hearing loss of both ears    Bilateral ears- hearing aids used- right ear hearing betther than left.  . History of blood transfusion 1957   "lots; most related to my periods"-last 1968 -s/p Hysterectomy  . Migraines    "years ago" (02/04/2013)  . Motion sickness    back seat - cars, sudden movements  . PONV (postoperative nausea and vomiting)   . Risk for falls    "wobbley" per daughter  . Self-catheterizes urinary bladder    "daily- retained urine and chronic UTI"  .  SVT (supraventricular tachycardia) ([facility])    Dr. Darnell Level. Taylor-follows "loop recorder left chest"  . Swallowing difficulty    freq occ. mostley liquids  . Urine incontinence   . UTI (urinary tract infection)    Chronic Urinary tract infections- tx Macrobid daily.  . Vertigo   . Wears hearing aid in both ears     Past Surgical History:  Procedure Laterality Date  . APPENDECTOMY  1948  . BLADDER SURGERY  1995   Repair-   . BREAST BIOPSY Bilateral    "both were fine"  . CATARACT EXTRACTION W/ INTRAOCULAR LENS IMPLANT Left   . CATARACT EXTRACTION W/PHACO Right 10/16/2018   Procedure: CATARACT EXTRACTION PHACO AND INTRAOCULAR LENS  PLACEMENT ([place])  RIGHT MiLoop diabetes 01:05.8  21.0%  13.77;  Surgeon: Birder Robson, MD;  Location: [facility];  Service: Ophthalmology;  Laterality: Right;  BS tends to drop in AM and would like early surgery so she can get home to eat, please  . CHOLECYSTECTOMY  2014  . COMBINED HYSTERECTOMY ABDOMINAL W/ A&P REPAIR / OOPHORECTOMY    . DILATION AND CURETTAGE OF UTERUS    . EP IMPLANTABLE DEVICE N/A 06/19/2014   Procedure: Loop Recorder Insertion;  Surgeon: Evans Lance, MD;  Location: [facility];  Service: Cardiovascular;  Laterality: N/A;  . EXCISION OF BREAST BIOPSY Right 05/13/2015   Procedure: RIGHT BREAST EXCISIONAL BIOPSY x2;  Surgeon: Armandina Gemma, MD;  Location: [facility];  Service: General;  Laterality: Right;  . TONSILLECTOMY  1946  . TOTAL ABDOMINAL HYSTERECTOMY    . VAGINAL HYSTERECTOMY  1968    Outpatient Medications Prior to Visit  Medication Sig Dispense Refill  . bimatoprost (LUMIGAN) 0.01 % SOLN Place 1 drop into both eyes at bedtime.    . Catheters MISC Replace catheter twice daily. 180 each 1  . cyclobenzaprine (FLEXERIL) 5 MG tablet Take 0.5 tablets (2.5 mg total) by mouth 2 (two) times daily as needed for muscle spasms. (Patient taking differently: Take 2.5 mg by mouth 2 (two) times daily as needed for muscle spasms. Only with Kidney pain) 30 tablet 2  . dorzolamide-timolol (COSOPT) 22.3-6.8 MG/ML ophthalmic solution Place 1 drop into both eyes 2 (two) times daily.     Marland Kitchen ELIQUIS 2.5 MG TABS tablet TAKE 1 TABLET TWICE A DAY 180 tablet 0  . mirtazapine (REMERON) 15 MG tablet TAKE 1 TABLET AT BEDTIME 90 tablet 1  . Multiple Vitamins-Minerals (PRESERVISION AREDS 2 PO) Take by mouth 2 (two) times daily.    Marland Kitchen omeprazole (PRILOSEC) 40 MG capsule TAKE 1 CAPSULE DAILY 90 capsule 1  . Probiotic Product (PROBIOTIC PO) Take by mouth daily.    . promethazine (PHENERGAN) 12.5 MG tablet TAKE 1 TABLET (12.5 MG TOTAL) BY MOUTH EVERY 6 (SIX) HOURS AS NEEDED FOR NAUSEA  OR VOMITING. (Patient taking differently: Take 12.5 mg by mouth every 6 (six) hours as needed for nausea or vomiting. Only with Kidney pain) 30 tablet 0  . saccharomyces boulardii (FLORASTOR) 250 MG capsule Take 250 mg by mouth daily.    Marland Kitchen sulfamethoxazole-trimethoprim (BACTRIM) 200-40 MG/5ML suspension TAKE 5 MLS BY MOUTH 3 (THREE) TIMES A WEEK. 100 mL 1   No facility-administered medications prior to visit.    Allergies  Allergen Reactions  . Codeine Other (See Comments)    Passed out    Family History  Problem Relation Age of Onset  . Stroke Father   . CVA Father   . Ovarian cancer Mother   .  Cancer - Other Sister        Breast  . Breast cancer Sister   . Cancer - Other Brother        Throat  . Cancer - Other Sister        Throat  . Brain cancer Sister   . Cancer - Other Other        Brain  . Breast cancer Other     Social History   Tobacco Use  . Smoking status: Never Smoker  . Smokeless tobacco: Never Used  Substance Use Topics  . Alcohol use: No    Alcohol/week: 0.0 standard drinks  . Drug use: No    ROS: As per history of present illness, otherwise negative  BP 123/77   Pulse 75   Ht 5\' 4"  (1.626 m)   Wt 93 lb 6.4 oz (42.4 kg)   SpO2 100%   BMI 16.03 kg/m  Constitutional: Well-developed and thin elderly female in no acute distress HEENT: Normocephalic and atraumatic. Conjunctivae are normal.  No scleral icterus. Neck: Neck supple. Trachea midline. Cardiovascular: Normal rate, regular rhythm  Pulmonary/chest: Effort normal and breath sounds normal. No wheezing, rales or rhonchi. Abdominal: Soft, mild tenderness in the left lower abdomen without rebound or guarding, nondistended. Bowel sounds active throughout. There are no masses palpable.  Extremities: no clubbing, cyanosis, or edema Neurological: Alert and oriented to person place and time.  Stands easily with assistance and able to get on and off the examining table without difficulty. Skin: Skin  is warm and dry.  Psychiatric: Normal mood and affect. Behavior is normal.  RELEVANT LABS AND IMAGING: CBC    Component Value Date/Time   WBC 7.1 12/11/2018 1523   RBC 4.03 12/11/2018 1523   HGB 12.4 12/11/2018 1523   HGB 12.4 02/23/2018 1525   HCT 38.2 12/11/2018 1523   HCT 36.2 02/23/2018 1525   PLT 246.0 12/11/2018 1523   PLT 239 02/23/2018 1525   MCV 94.9 12/11/2018 1523   MCV 93 02/23/2018 1525   MCV 93 07/03/2012 1533   MCH 31.9 02/23/2018 1525   MCH 30.9 05/08/2015 0930   MCHC 32.3 12/11/2018 1523   RDW 13.5 12/11/2018 1523   RDW 12.4 02/23/2018 1525   RDW 13.1 07/03/2012 1533   LYMPHSABS 1.3 12/11/2018 1523   LYMPHSABS 1.7 02/23/2018 1525   LYMPHSABS 1.2 07/03/2012 1533   MONOABS 0.7 12/11/2018 1523   MONOABS 0.6 07/03/2012 1533   EOSABS 0.1 12/11/2018 1523   EOSABS 0.1 02/23/2018 1525   EOSABS 0.1 07/03/2012 1533   BASOSABS 0.0 12/11/2018 1523   BASOSABS 0.0 02/23/2018 1525   BASOSABS 0.1 07/03/2012 1533    CMP     Component Value Date/Time   NA 139 12/11/2018 1523   NA 142 02/23/2018 1525   K 3.9 12/11/2018 1523   CL 104 12/11/2018 1523   CO2 26 12/11/2018 1523   GLUCOSE 93 12/11/2018 1523   BUN 18 12/11/2018 1523   BUN 17 02/23/2018 1525   CREATININE 0.78 12/11/2018 1523   CALCIUM 9.3 12/11/2018 1523   PROT 6.5 08/16/2016 1105   ALBUMIN 3.4 (L) 08/16/2016 1105   AST 21 08/16/2016 1105   ALT 17 08/16/2016 1105   ALKPHOS 71 08/16/2016 1105   BILITOT 0.4 08/16/2016 1105   GFRNONAA 61 02/23/2018 1525   GFRAA 70 02/23/2018 1525    ASSESSMENT/PLAN: 86 year old female with a past medical history of GERD, diverticulosis, atrial fibrillation on Eliquis, diastolic dysfunction, glaucoma, chronic urinary incontinence using  an in and out cath on chronic suppressive Bactrim who is seen to discuss intermittent episodes of diarrhea.   1.  Episodic diarrhea/history of diverticulosis --we discussed these episodes which may be secondary to an overflow diarrhea.   I am going to have her continue probiotic but start fiber therapy to see if we can improve overall bowel emptying.  We discussed diverticulosis today and how this can affect overall bowel movements particularly in elderly patients.  I do not think colonoscopy would add additional information at this time but if symptoms worsen we could consider cross-sectional imaging or even direct visualization.  She and her daughter are in agreement with this plan --Continue Florastor 250 mg daily --Begin Benefiber 2 teaspoons a day, consider increasing to twice daily if tolerated --Office follow-up if not improving or episodes worsening or any new signs such as rectal bleeding or melena  2.  History of GERD --she is continued on omeprazole 40 mg daily which was started by primary care  3.  Weight loss --I encouraged her and her daughter to focus on palatable foods with smaller more frequent meals as well as nutritional supplements.  Continue boost daily.  Consider calorie count and increasing with high-calorie/high-protein nutritional supplements as tolerated.   [id number], [address], [place],  [place] [id number]

## 2019-05-23 DIAGNOSIS — I480 Paroxysmal atrial fibrillation: Secondary | ICD-10-CM | POA: Diagnosis not present

## 2019-05-23 DIAGNOSIS — R0600 Dyspnea, unspecified: Secondary | ICD-10-CM | POA: Diagnosis not present

## 2019-06-12 ENCOUNTER — Ambulatory Visit (INDEPENDENT_AMBULATORY_CARE_PROVIDER_SITE_OTHER): Payer: Medicare Other | Admitting: Internal Medicine

## 2019-06-12 ENCOUNTER — Other Ambulatory Visit: Payer: Self-pay

## 2019-06-12 ENCOUNTER — Encounter: Payer: Self-pay | Admitting: Internal Medicine

## 2019-06-12 ENCOUNTER — Ambulatory Visit (INDEPENDENT_AMBULATORY_CARE_PROVIDER_SITE_OTHER): Payer: Medicare Other

## 2019-06-12 VITALS — BP 168/58 | HR 90 | Temp 98.4°F | Ht 64.0 in | Wt 91.5 lb

## 2019-06-12 DIAGNOSIS — M79672 Pain in left foot: Secondary | ICD-10-CM | POA: Insufficient documentation

## 2019-06-12 DIAGNOSIS — M7989 Other specified soft tissue disorders: Secondary | ICD-10-CM | POA: Diagnosis not present

## 2019-06-12 DIAGNOSIS — M1189 Other specified crystal arthropathies, multiple sites: Secondary | ICD-10-CM | POA: Diagnosis not present

## 2019-06-12 MED ORDER — COLCHICINE 0.6 MG PO CAPS: 1.0000 | ORAL_CAPSULE | Freq: Two times a day (BID) | ORAL | 0 refills | Status: DC

## 2019-06-12 NOTE — Progress Notes (Signed)
Subjective:  Patient ID: Julie Silva, female    DOB: 09/24/1927  Age: 86 y.o. MRN: [id number]  CC: Foot Pain  This visit occurred during the SARS-CoV-2 public health emergency.  Safety protocols were in place, including screening questions prior to the visit, additional usage of staff PPE, and extensive cleaning of exam room while observing appropriate contact time as indicated for disinfecting solutions.    HPI Da S Blecha presents for concerns about her left foot.  She tells me that 3 weeks ago she developed nontraumatic pain and swelling in the dorsum of her midfoot.  She felt like it was another episode of gout so she took a course of methylprednisolone and says the symptoms have drastically improved.  She now only has mild discomfort in the foot.  She tells me it hurts to bear weight on the left foot.  Outpatient Medications Prior to Visit  Medication Sig Dispense Refill  . bimatoprost (LUMIGAN) 0.01 % SOLN Place 1 drop into both eyes at bedtime.    . Catheters MISC Replace catheter twice daily. 180 each 1  . cyclobenzaprine (FLEXERIL) 5 MG tablet Take 0.5 tablets (2.5 mg total) by mouth 2 (two) times daily as needed for muscle spasms. (Patient taking differently: Take 2.5 mg by mouth 2 (two) times daily as needed for muscle spasms. Only with Kidney pain) 30 tablet 2  . dorzolamide-timolol (COSOPT) 22.3-6.8 MG/ML ophthalmic solution Place 1 drop into both eyes 2 (two) times daily.     Marland Kitchen ELIQUIS 2.5 MG TABS tablet TAKE 1 TABLET TWICE A DAY 180 tablet 0  . metoprolol tartrate (LOPRESSOR) 25 MG tablet Take by mouth.    . mirtazapine (REMERON) 15 MG tablet TAKE 1 TABLET AT BEDTIME 90 tablet 1  . Multiple Vitamins-Minerals (PRESERVISION AREDS 2 PO) Take by mouth 2 (two) times daily.    Marland Kitchen omeprazole (PRILOSEC) 40 MG capsule TAKE 1 CAPSULE DAILY 90 capsule 1  . Probiotic Product (PROBIOTIC PO) Take by mouth daily.    . promethazine (PHENERGAN) 12.5 MG tablet TAKE 1 TABLET (12.5 MG TOTAL) BY  MOUTH EVERY 6 (SIX) HOURS AS NEEDED FOR NAUSEA OR VOMITING. (Patient taking differently: Take 12.5 mg by mouth every 6 (six) hours as needed for nausea or vomiting. Only with Kidney pain) 30 tablet 0  . saccharomyces boulardii (FLORASTOR) 250 MG capsule Take 250 mg by mouth daily.    Marland Kitchen sulfamethoxazole-trimethoprim (BACTRIM) 200-40 MG/5ML suspension TAKE 5 MLS BY MOUTH 3 (THREE) TIMES A WEEK. (Patient not taking: Reported on 06/12/2019) 100 mL 1   No facility-administered medications prior to visit.    ROS Review of Systems  Constitutional: Negative for chills, fatigue and fever.  HENT: Negative.   Eyes: Negative.   Respiratory: Negative for cough, chest tightness and wheezing.   Cardiovascular: Negative for chest pain, palpitations and leg swelling.  Gastrointestinal: Negative for abdominal pain, diarrhea and vomiting.  Endocrine: Negative.   Genitourinary: Negative.  Negative for difficulty urinating.  Musculoskeletal: Positive for arthralgias. Negative for back pain and myalgias.  Skin: Negative for color change and rash.  Neurological: Negative.  Negative for dizziness, weakness and headaches.  Hematological: Negative for adenopathy. Does not bruise/bleed easily.  Psychiatric/Behavioral: Negative.     Objective:  BP (!) 168/58 (BP Location: Left Arm, Patient Position: Sitting, Cuff Size: Normal)   Pulse 90   Temp 98.4 F (36.9 C) (Oral)   Ht 5\' 4"  (1.626 m)   Wt 91 lb 8 oz (41.5 kg)   SpO2  97%   BMI 15.71 kg/m   BP Readings from Last 3 Encounters:  06/12/19 (!) 168/58  05/21/19 123/77  12/11/18 (!) 164/80    Wt Readings from Last 3 Encounters:  06/12/19 91 lb 8 oz (41.5 kg)  05/21/19 93 lb 6.4 oz (42.4 kg)  12/11/18 104 lb (47.2 kg)    Physical Exam Vitals reviewed.  Constitutional:      Appearance: Normal appearance.  HENT:     Nose: Nose normal.     Mouth/Throat:     Mouth: Mucous membranes are moist.  Eyes:     General: No scleral icterus.     Conjunctiva/sclera: Conjunctivae normal.  Cardiovascular:     Rate and Rhythm: Normal rate and regular rhythm.     Pulses:          Dorsalis pedis pulses are 1+ on the right side and 1+ on the left side.       Posterior tibial pulses are 1+ on the right side and 1+ on the left side.     Heart sounds: No murmur heard.   Pulmonary:     Effort: Pulmonary effort is normal.     Breath sounds: No stridor. No wheezing, rhonchi or rales.  Abdominal:     General: Abdomen is flat.     Palpations: There is no mass.     Tenderness: There is no abdominal tenderness.  Musculoskeletal:        General: Normal range of motion.     Cervical back: Neck supple.     Right foot: Normal.     Left foot: Normal range of motion. Tenderness and bony tenderness present. No swelling, deformity or crepitus.     Comments: There is mild tenderness to palpation over the dorsum of the left midfoot.  Feet:     Right foot:     Skin integrity: Skin integrity normal.     Toenail Condition: Right toenails are normal.     Left foot:     Skin integrity: Skin integrity normal. No ulcer, blister, skin breakdown, erythema, warmth or dry skin.     Toenail Condition: Left toenails are normal.  Lymphadenopathy:     Cervical: No cervical adenopathy.  Neurological:     Mental Status: She is alert.     Lab Results  Component Value Date   WBC 7.1 12/11/2018   HGB 12.4 12/11/2018   HCT 38.2 12/11/2018   PLT 246.0 12/11/2018   GLUCOSE 93 12/11/2018   CHOL 168 04/04/2016   TRIG 105.0 04/04/2016   HDL 58.10 04/04/2016   LDLCALC 89 04/04/2016   ALT 17 08/16/2016   AST 21 08/16/2016   NA 139 12/11/2018   K 3.9 12/11/2018   CL 104 12/11/2018   CREATININE 0.78 12/11/2018   BUN 18 12/11/2018   CO2 26 12/11/2018   TSH 3.69 04/04/2016   INR 0.9 07/03/2012   HGBA1C 5.5 05/07/2013    DG Chest 2 View  Result Date: 12/11/2018 CLINICAL DATA:  Cough and chest pain for 1 week. EXAM: CHEST - 2 VIEW COMPARISON:  04/04/2016  and prior radiographs FINDINGS: The cardiomediastinal silhouette is unremarkable. Reticulonodular opacities within both mid lungs are not significantly changed from remote studies. Scarring changes within the RIGHT LOWER lung noted. There is no evidence of focal airspace disease, pulmonary edema, suspicious pulmonary nodule/mass, pleural effusion, or pneumothorax. No acute bony abnormalities are identified. IMPRESSION: 1. No evidence of acute cardiopulmonary disease. 2. Unchanged reticulonodular opacities within both mid lungs which  may represent chronic infection/inflammation. Electronically Signed   By: Margarette Canada M.D.   On: 12/11/2018 15:24   DG Foot Complete Left  Result Date: 06/13/2019 CLINICAL DATA:  Pain and swelling for 3 weeks EXAM: LEFT FOOT - COMPLETE 3+ VIEW COMPARISON:  None. FINDINGS: Frontal, oblique, and lateral views were obtained. There is flexion of the second, third, fourth, and fifth PIP joints and extension of the second, third, fourth, and fifth MTP joints. No fracture or dislocation. No appreciable joint space narrowing or erosion. IMPRESSION: Flexion of the second, third, fourth, and fifth PIP joints with extension of the second, third, fourth, and fifth MTP joints. No appreciable joint space narrowing. No fracture or dislocation. Electronically Signed   By: Lowella Grip III M.D.   On: 06/13/2019 12:05    Assessment & Plan:   Liley was seen today for foot pain.  Diagnoses and all orders for this visit:  Acute foot pain, left- Based on her symptoms, exam, and x-ray I think she has had a recurrence of pseudogout.  I recommended that she start taking colchicine. -     DG Foot Complete Left; Future  Pseudogout involving multiple joints -     Colchicine (MITIGARE) 0.6 MG CAPS; Take 1 tablet by mouth 2 (two) times daily.   I am having Faydra S. Carlo start on Colchicine. I am also having her maintain her bimatoprost, dorzolamide-timolol, Catheters, promethazine,  cyclobenzaprine, Multiple Vitamins-Minerals (PRESERVISION AREDS 2 PO), Probiotic Product (PROBIOTIC PO), sulfamethoxazole-trimethoprim, omeprazole, mirtazapine, Eliquis, saccharomyces boulardii, and metoprolol tartrate.  Meds ordered this encounter  Medications  . Colchicine (MITIGARE) 0.6 MG CAPS    Sig: Take 1 tablet by mouth 2 (two) times daily.    Dispense:  14 capsule    Refill:  0     Follow-up: No follow-ups on file.  Scarlette Calico, MD

## 2019-06-12 NOTE — Patient Instructions (Signed)
Calcium Pyrophosphate Deposition Calcium pyrophosphate deposition (CPPD) is a type of arthritis that causes pain, swelling, and inflammation in a joint. Attacks of CPPD may come and go. The joint pain can be severe and may last for days to weeks. This condition usually affects one joint at a time. The knees are most often affected, but this condition can also affect the wrists, elbows, shoulders, or ankles. CPPD may also be called pseudogout because it is similar to gout. Both conditions result from the buildup of crystals in a joint. However, CPPD is caused by a type of crystal that is different from the crystals that cause gout. What are the causes? This condition is caused by the buildup of calcium pyrophosphate dihydrate crystals in a joint. The reason why this buildup occurs is not known. An increased likelihood of having this condition (predisposition) may be passed from parent to child (is hereditary). What increases the risk? This condition is more likely to develop in people who:  Are older than 84 years of age.  Have a family history of CPPD.  Have certain medical conditions, such as hemophilia, amyloidosis, or overactive parathyroid glands.  Have low levels of magnesium in the blood. What are the signs or symptoms? Symptoms of this condition include:  Joint pain. The pain may: ? Be intense and constant. ? Develop quickly. ? Get worse with movement. ? Last from several days to a few weeks.  Redness, swelling, stiffness, and warmth at the joint. How is this diagnosed? To diagnose this condition, your health care provider will use a needle to remove fluid from the joint. The fluid will be examined for the crystals that cause CPPD. You also may have additional tests, such as:  Blood tests.  X-rays.  Ultrasound.  MRI. How is this treated? There is no way to remove the crystals from the joint and no cure for this condition. However, treatment can relieve symptoms and improve  joint function. Treatment may include:  NSAIDs to reduce inflammation and pain.  Removing some of the fluid and crystals from around the joint with a needle.  Injections of medicine (cortisone) into the joint to reduce pain and swelling.  Medicines to help prevent attacks.  Physical therapy to improve joint function. Follow these instructions at home: Managing pain, stiffness, and swelling   Rest the affected joint until your symptoms start to go away.  If directed, put ice on the affected area to relieve pain and swelling: ? Put ice in a plastic bag. ? Place a towel between your skin and the bag. ? Leave the ice on for 20 minutes, 2-3 times a day.  Keep your affected joint raised (elevated) above the level of your heart, when possible. This will help to reduce swelling. For example, prop your foot up on a chair while sitting down to elevate your knee. General instructions  If the painful joint is in your leg, use crutches as told by your health care provider.  Take over-the-counter and prescription medicines only as told by your health care provider.  When your symptoms start to go away, begin to exercise regularly or do physical therapy. Talk with your health care provider or physical therapist about what types of exercise are safe for you. Exercise that is easier on your joints (low-impact exercise) may be best. This includes walking, swimming, bicycling, and water aerobics.  Maintain a healthy weight. Excess weight puts stress on your joints.  Keep all follow-up visits as told by your health care provider  and physical therapist. This is important. Contact a health care provider if you:  Notice that your symptoms get worse.  Develop a skin rash.  Notice that your pain gets worse. Get help right away if you:  Have a fever.  Have difficulty breathing.  Are taking NSAIDs and you: ? Vomit blood. ? Have blood in your stool. ? Have stool that is tarry and  black. Summary  Calcium pyrophosphate deposition (CPPD) is a type of arthritis that causes pain, swelling, and inflammation in a joint. The knees are most often affected, but it can also affect the wrists, elbows, shoulders, or ankles.  CPPD is caused by the buildup of calcium crystals in a joint. The reason why this occurs is not known.  Attacks of CPPD may come and go. The joint pain can be severe and may last for days to weeks.  There is no way to remove the crystals from the joint and no cure for this condition. However, treatment can relieve symptoms and improve joint function.  Rest the affected joint until your symptoms start to go away. After your symptoms go away, begin to exercise regularly or do physical therapy. This information is not intended to replace advice given to you by your health care provider. Make sure you discuss any questions you have with your health care provider. Document Revised: 12/02/2016 Document Reviewed: 10/18/2016 Elsevier Patient Education  [place].

## 2019-06-18 ENCOUNTER — Encounter: Payer: Self-pay | Admitting: Internal Medicine

## 2019-06-24 ENCOUNTER — Other Ambulatory Visit: Payer: Self-pay | Admitting: Internal Medicine

## 2019-06-24 DIAGNOSIS — B9620 Unspecified Escherichia coli [E. coli] as the cause of diseases classified elsewhere: Secondary | ICD-10-CM

## 2019-07-19 ENCOUNTER — Encounter: Payer: Self-pay | Admitting: Internal Medicine

## 2019-07-19 ENCOUNTER — Other Ambulatory Visit: Payer: Self-pay | Admitting: Internal Medicine

## 2019-07-19 DIAGNOSIS — M1189 Other specified crystal arthropathies, multiple sites: Secondary | ICD-10-CM

## 2019-07-19 MED ORDER — METHYLPREDNISOLONE 4 MG PO TBPK: ORAL_TABLET | ORAL | 0 refills | Status: AC

## 2019-08-14 ENCOUNTER — Other Ambulatory Visit: Payer: Self-pay | Admitting: Internal Medicine

## 2019-08-15 ENCOUNTER — Other Ambulatory Visit: Payer: Self-pay | Admitting: Internal Medicine

## 2019-08-15 DIAGNOSIS — M1189 Other specified crystal arthropathies, multiple sites: Secondary | ICD-10-CM

## 2019-08-15 MED ORDER — NABUMETONE 500 MG PO TABS: 500.0000 mg | ORAL_TABLET | Freq: Every day | ORAL | 1 refills | Status: DC

## 2019-08-20 ENCOUNTER — Telehealth: Payer: Self-pay

## 2019-08-20 NOTE — Telephone Encounter (Signed)
Key: [id number]

## 2019-08-21 NOTE — Telephone Encounter (Signed)
Can let pt know that the PA was approved and to call the pharmacy.

## 2019-09-23 DIAGNOSIS — H353131 Nonexudative age-related macular degeneration, bilateral, early dry stage: Secondary | ICD-10-CM | POA: Diagnosis not present

## 2019-09-29 ENCOUNTER — Other Ambulatory Visit: Payer: Self-pay | Admitting: Internal Medicine

## 2019-09-29 DIAGNOSIS — K2100 Gastro-esophageal reflux disease with esophagitis, without bleeding: Secondary | ICD-10-CM

## 2019-10-03 ENCOUNTER — Other Ambulatory Visit: Payer: Self-pay | Admitting: Internal Medicine

## 2019-10-03 ENCOUNTER — Encounter: Payer: Self-pay | Admitting: Internal Medicine

## 2019-10-03 DIAGNOSIS — M549 Dorsalgia, unspecified: Secondary | ICD-10-CM

## 2019-10-03 DIAGNOSIS — G8929 Other chronic pain: Secondary | ICD-10-CM

## 2019-10-05 ENCOUNTER — Other Ambulatory Visit: Payer: Self-pay | Admitting: Internal Medicine

## 2019-10-07 ENCOUNTER — Telehealth: Payer: Self-pay

## 2019-10-07 NOTE — Telephone Encounter (Signed)
Key: [id number]

## 2019-11-25 DIAGNOSIS — Z23 Encounter for immunization: Secondary | ICD-10-CM | POA: Diagnosis not present

## 2019-12-10 ENCOUNTER — Other Ambulatory Visit: Payer: Self-pay | Admitting: Internal Medicine

## 2019-12-10 DIAGNOSIS — K2100 Gastro-esophageal reflux disease with esophagitis, without bleeding: Secondary | ICD-10-CM

## 2019-12-25 ENCOUNTER — Other Ambulatory Visit: Payer: Self-pay

## 2019-12-25 ENCOUNTER — Ambulatory Visit (INDEPENDENT_AMBULATORY_CARE_PROVIDER_SITE_OTHER): Payer: Medicare Other

## 2019-12-25 DIAGNOSIS — Z23 Encounter for immunization: Secondary | ICD-10-CM

## 2020-01-29 ENCOUNTER — Other Ambulatory Visit: Payer: Self-pay | Admitting: Internal Medicine

## 2020-01-29 DIAGNOSIS — N390 Urinary tract infection, site not specified: Secondary | ICD-10-CM

## 2020-01-29 NOTE — Telephone Encounter (Signed)
65f, 41.5kg, scr 1.0 04/23/19, lovw/taylor 02/23/18 (looks like patient is followed elsewhere and this refill should be going to [facility] cardiology instead

## 2020-02-04 HISTORY — PX: PACEMAKER IMPLANT: EP1218

## 2020-02-14 DIAGNOSIS — R001 Bradycardia, unspecified: Secondary | ICD-10-CM | POA: Diagnosis not present

## 2020-02-17 DIAGNOSIS — R001 Bradycardia, unspecified: Secondary | ICD-10-CM | POA: Diagnosis not present

## 2020-02-20 DIAGNOSIS — Z7901 Long term (current) use of anticoagulants: Secondary | ICD-10-CM | POA: Diagnosis not present

## 2020-02-20 DIAGNOSIS — Z20822 Contact with and (suspected) exposure to covid-19: Secondary | ICD-10-CM | POA: Diagnosis not present

## 2020-02-20 DIAGNOSIS — R001 Bradycardia, unspecified: Secondary | ICD-10-CM | POA: Diagnosis not present

## 2020-02-20 DIAGNOSIS — Z006 Encounter for examination for normal comparison and control in clinical research program: Secondary | ICD-10-CM | POA: Diagnosis not present

## 2020-02-20 DIAGNOSIS — I480 Paroxysmal atrial fibrillation: Secondary | ICD-10-CM | POA: Diagnosis not present

## 2020-02-20 DIAGNOSIS — R0789 Other chest pain: Secondary | ICD-10-CM | POA: Diagnosis not present

## 2020-02-20 DIAGNOSIS — I10 Essential (primary) hypertension: Secondary | ICD-10-CM | POA: Diagnosis not present

## 2020-02-20 DIAGNOSIS — Z79899 Other long term (current) drug therapy: Secondary | ICD-10-CM | POA: Diagnosis not present

## 2020-02-21 DIAGNOSIS — R001 Bradycardia, unspecified: Secondary | ICD-10-CM | POA: Diagnosis not present

## 2020-02-21 DIAGNOSIS — Z7901 Long term (current) use of anticoagulants: Secondary | ICD-10-CM | POA: Diagnosis not present

## 2020-02-21 DIAGNOSIS — I480 Paroxysmal atrial fibrillation: Secondary | ICD-10-CM | POA: Diagnosis not present

## 2020-02-21 DIAGNOSIS — Z20822 Contact with and (suspected) exposure to covid-19: Secondary | ICD-10-CM | POA: Diagnosis not present

## 2020-02-21 DIAGNOSIS — Z006 Encounter for examination for normal comparison and control in clinical research program: Secondary | ICD-10-CM | POA: Diagnosis not present

## 2020-02-21 DIAGNOSIS — Z950 Presence of cardiac pacemaker: Secondary | ICD-10-CM | POA: Diagnosis not present

## 2020-02-21 DIAGNOSIS — R918 Other nonspecific abnormal finding of lung field: Secondary | ICD-10-CM | POA: Diagnosis not present

## 2020-02-21 DIAGNOSIS — I10 Essential (primary) hypertension: Secondary | ICD-10-CM | POA: Diagnosis not present

## 2020-02-21 DIAGNOSIS — J90 Pleural effusion, not elsewhere classified: Secondary | ICD-10-CM | POA: Diagnosis not present

## 2020-02-25 ENCOUNTER — Telehealth: Payer: Self-pay | Admitting: Pulmonary Disease

## 2020-02-25 DIAGNOSIS — R918 Other nonspecific abnormal finding of lung field: Secondary | ICD-10-CM

## 2020-02-25 NOTE — Telephone Encounter (Signed)
Received chest x-ray results from [place] where patient was admitted for pacemaker implantation.  Report in care everywhere  Chest x-ray 02/21/2020, comparison 09/02/2005 Impression:  1. New nodular opacity in the right upper lobe and mild reticular opacities  in the bilateral mid lungs. These findings may reflect an atypical  infection, such as nontuberculous mycobacteria, or aspiration. Neoplasm in  the right upper lobe cannot be excluded. Chest CT recommended.   2. New leadless pacemaker in the right ventricle.   3. Trace left pleural effusion.   Patient has not been seen in clinic since 2018 Please order CT chest without contrast for lung nodule and follow-up in pulmonary clinic with me.

## 2020-02-25 NOTE — Addendum Note (Signed)
Addended by: Valerie Salts on: 02/25/2020 03:20 PM   Modules accepted: Orders

## 2020-02-25 NOTE — Telephone Encounter (Signed)
Spoke with patient's daughter [name]. She verbalized understanding on the reasoning behind the CT scan. Patient has been scheduled to see Dr. Vaughan Browner on 03/25/20 at 1130am. She is aware of the new office location and phone number.   Will go ahead and place order for CT scan. Per patient's daughter, they prefer to have the CT done in [place]. Order has been placed.

## 2020-03-19 ENCOUNTER — Ambulatory Visit
Admission: RE | Admit: 2020-03-19 | Discharge: 2020-03-19 | Disposition: A | Discharge status: Home / Self Care | Payer: Medicare Other | Source: Ambulatory Visit | Attending: Pulmonary Disease | Admitting: Pulmonary Disease

## 2020-03-19 ENCOUNTER — Other Ambulatory Visit: Payer: Self-pay

## 2020-03-19 DIAGNOSIS — I2510 Atherosclerotic heart disease of native coronary artery without angina pectoris: Secondary | ICD-10-CM | POA: Diagnosis not present

## 2020-03-19 DIAGNOSIS — R918 Other nonspecific abnormal finding of lung field: Secondary | ICD-10-CM

## 2020-03-19 DIAGNOSIS — J984 Other disorders of lung: Secondary | ICD-10-CM | POA: Diagnosis not present

## 2020-03-19 DIAGNOSIS — M47814 Spondylosis without myelopathy or radiculopathy, thoracic region: Secondary | ICD-10-CM | POA: Diagnosis not present

## 2020-03-19 DIAGNOSIS — J479 Bronchiectasis, uncomplicated: Secondary | ICD-10-CM | POA: Diagnosis not present

## 2020-03-23 DIAGNOSIS — H353131 Nonexudative age-related macular degeneration, bilateral, early dry stage: Secondary | ICD-10-CM | POA: Diagnosis not present

## 2020-03-23 DIAGNOSIS — H02052 Trichiasis without entropian right lower eyelid: Secondary | ICD-10-CM | POA: Diagnosis not present

## 2020-03-23 DIAGNOSIS — H401132 Primary open-angle glaucoma, bilateral, moderate stage: Secondary | ICD-10-CM | POA: Diagnosis not present

## 2020-03-25 ENCOUNTER — Encounter: Payer: Self-pay | Admitting: Pulmonary Disease

## 2020-03-25 ENCOUNTER — Other Ambulatory Visit: Payer: Self-pay

## 2020-03-25 ENCOUNTER — Ambulatory Visit (INDEPENDENT_AMBULATORY_CARE_PROVIDER_SITE_OTHER): Payer: Medicare Other | Admitting: Pulmonary Disease

## 2020-03-25 VITALS — BP 146/72 | HR 76 | Temp 97.3°F | Ht 64.5 in | Wt 104.0 lb

## 2020-03-25 DIAGNOSIS — R9389 Abnormal findings on diagnostic imaging of other specified body structures: Secondary | ICD-10-CM

## 2020-03-25 NOTE — Patient Instructions (Signed)
Glad you are breathing is stable We have reviewed the CT scan which shows a nodular opacity in the cavity which may be a chronic infection called MAI. There is also possibility that this may be of malignancy  We are discussed options and elected to do conservative management We will order CT chest without contrast and 6 months Follow-up in 6 months

## 2020-03-25 NOTE — Progress Notes (Signed)
Julie Silva    [id number]    [birth year]  Primary Care Physician:Jones, Arvid Right, MD  Referring Physician: Janith Lima, MD [address] [place],  [place] [address]  Chief complaint: Follow up for abnormal CT scan.  HPI: Julie Silva was previously followed in the pulmonary clinic for abnormal CT with bronchiectatic, tree-in-bud changes concerning for MAI.  She was last seen in 2018 At that time she was also on chronic nitrofurantoin for UTI for many years for chronic UTI  Recommendations were for conservative management as she was asymptomatic and not producing sputum Bronchoscopy or treatment for MAI was not recommended due to age, frailty, malnutrition with weight loss Suspicion for nitrofurantoin toxicity was low but she was taken off this medication and placed on Bactrim for UTI prophylaxis. She denies any signs and symptoms of connective tissue disease, autoimmune disease.  She is a lifelong non-smoker.  Interim History:  Returns to clinic after 4 years for new abnormal lung imaging findings She recently had significant symptomatic bradycardia and had wireless pacemaker placed at [facility] Chest x-ray post procedure showed some new apical opacities  She had a CT scan is here for review States that she is doing well as before with no respiratory symptoms.  Denies any cough, sputum production, dyspnea   Allergies as of 03/25/2020 - Review Complete 03/25/2020  Allergen Reaction Noted  . Codeine Other (See Comments) 02/02/2013    Past Medical History:  Diagnosis Date  . Anemia   . Arthritis    Osteoporosis, neck stiffness  . Atrial fibrillation ([place])   . Bleeds easily [facility])    "father was a hemophiliac" - pt not being bothered in recent years.  . Calcium deposit in bursa of knee 2017  . Cancer ([place])    skin cancer "basal cell".  . Chicken pox   . Cyst, Baker's knee    left knee- tx. Cortisone injection 05-05-15 in office- "improved pain relief and swelling  left ankle and knee"  . Diverticulitis   . Dysrhythmia    Dr. McAlhany-cardiology  . GERD (gastroesophageal reflux disease)   . Glaucoma of both eyes   . Gout   . Hearing loss of both ears    Bilateral ears- hearing aids used- right ear hearing betther than left.  . History of blood transfusion 1957   "lots; most related to my periods"-last 1968 -s/p Hysterectomy  . Migraines    "years ago" (02/04/2013)  . Motion sickness    back seat - cars, sudden movements  . PONV (postoperative nausea and vomiting)   . Risk for falls    "wobbley" per daughter  . Self-catheterizes urinary bladder    "daily- retained urine and chronic UTI"  . SVT (supraventricular tachycardia) (HCC)    Dr. Darnell Level. Taylor-follows "loop recorder left chest"  . Swallowing difficulty    freq occ. mostley liquids  . Urine incontinence   . UTI (urinary tract infection)    Chronic Urinary tract infections- tx Macrobid daily.  . Vertigo   . Wears hearing aid in both ears     Past Surgical History:  Procedure Laterality Date  . APPENDECTOMY  1948  . BLADDER SURGERY  1995   Repair-   . BREAST BIOPSY Bilateral    "both were fine"  . CATARACT EXTRACTION W/ INTRAOCULAR LENS IMPLANT Left   . CATARACT EXTRACTION W/PHACO Right 10/16/2018   Procedure: CATARACT EXTRACTION PHACO AND INTRAOCULAR LENS PLACEMENT (I[facility])  RIGHT  MiLoop diabetes 01:05.8  21.0%  13.77;  Surgeon: Birder Robson, MD;  Location: [facility];  Service: Ophthalmology;  Laterality: Right;  BS tends to drop in AM and would like early surgery so she can get home to eat, please  . CHOLECYSTECTOMY  2014  . COMBINED HYSTERECTOMY ABDOMINAL W/ A&P REPAIR / OOPHORECTOMY    . DILATION AND CURETTAGE OF UTERUS    . EP IMPLANTABLE DEVICE N/A 06/19/2014   Procedure: Loop Recorder Insertion;  Surgeon: Evans Lance, MD;  Location: [facility];  Service: Cardiovascular;  Laterality: N/A;  . EXCISION OF BREAST BIOPSY Right 05/13/2015   Procedure: RIGHT  BREAST EXCISIONAL BIOPSY x2;  Surgeon: Armandina Gemma, MD;  Location: [facility];  Service: General;  Laterality: Right;  . TONSILLECTOMY  1946  . TOTAL ABDOMINAL HYSTERECTOMY    . VAGINAL HYSTERECTOMY  1968    Family History  Problem Relation Age of Onset  . Stroke Father   . CVA Father   . Ovarian cancer Mother   . Cancer - Other Sister        Breast  . Breast cancer Sister   . Cancer - Other Brother        Throat  . Cancer - Other Sister        Throat  . Brain cancer Sister   . Cancer - Other Other        Brain  . Breast cancer Other     Social History   Socioeconomic History  . Marital status: Widowed    Spouse name: Not on file  . Number of children: 4  . Years of education: 49  . Highest education level: Not on file  Occupational History  . Occupation: Runner, broadcasting/film/video  Tobacco Use  . Smoking status: Never Smoker  . Smokeless tobacco: Never Used  Vaping Use  . Vaping Use: Never used  Substance and Sexual Activity  . Alcohol use: No    Alcohol/week: 0.0 standard drinks  . Drug use: No  . Sexual activity: Never  Other Topics Concern  . Not on file  Social History Narrative   Lives alone in a one story home.  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    Social Determinants of Health   Financial Resource Strain: Not on file  Food Insecurity: Not on file  Transportation Needs: Not on file  Physical Activity: Not on file  Stress: Not on file  Social Connections: Not on file  Intimate Partner Violence: Not on file    Review of systems: Review of Systems  Constitutional: Negative for fever and chills.  HENT: Negative.   Eyes: Negative for blurred vision.  Respiratory: as per HPI  Cardiovascular: Negative for chest pain and palpitations.  Gastrointestinal: Negative for vomiting, diarrhea, blood per rectum. Genitourinary: Negative for dysuria, urgency, frequency and hematuria.  Musculoskeletal: Negative for myalgias, back pain and joint pain.  Skin:  Negative for itching and rash.  Neurological: Negative for dizziness, tremors, focal weakness, seizures and loss of consciousness.  Endo/Heme/Allergies: Negative for environmental allergies.  Psychiatric/Behavioral: Negative for depression, suicidal ideas and hallucinations.  All other systems reviewed and are negative.  Physical Exam: Blood pressure (!) 146/72, pulse 76, temperature (!) 97.3 F (36.3 C), temperature source Temporal, height 5' 4.5" (1.638 m), weight 104 lb (47.2 kg), SpO2 96 %. Gen:      No acute distress, frail, elderly HEENT:  EOMI, sclera anicteric Neck:     No masses; no thyromegaly Lungs:  Clear to auscultation bilaterally; normal respiratory effort CV:         Regular rate and rhythm; no murmurs Abd:      + bowel sounds; soft, non-tender; no palpable masses, no distension Ext:    No edema; adequate peripheral perfusion Skin:      Warm and dry; no rash Neuro: alert and oriented x 3 Psych: normal mood and affect  Data Reviewed: CT scan  05/01/15- diffuse emphysematous changes, bilateral consolidative nodular opacities in the lower lobes, right middle lobe bronchiectasis, tree-in-bud opacity.   CT scan 07/31/15-  similar to above. Some consolidative changes are worse when others show improvement. Images personally reviewed.   CT chest 03/19/2020-moderate bilateral bronchiectasis unchanged from before, persistent tree-in-bud with cavitary 2.1 cm right lower lobe nodule and a new 1.3 cm nodular consolidation in the right upper lobe. I have reviewed the images personally.  Assessment:  Bronchiectasis, concern for MAI She has predominantly right middle lobe bronchiectasis, tree-in-bud opacities and consolidative changes in the bases. This is suggestive of MAI infection. She is using a flutter valve but she is unable to bring up secretions.  I have reviewed the latest CT scan with waxing and waning tree-in-bud opacities suggestive of ongoing infection.  Cannot rule out  malignancy with a cavitary and new nodular lesion but the chances of that are low as she is a lifelong non-smoker.  We had extensive discussion with her and daughter in office today.  As before the concern is that with her frailty and age she would not tolerate a bronchoscope or treatment for MAI if confirmed.  Family prefers to continue conservative management and a 54-month CT scan follow-up has been ordered.  She has been off nitrofurantoin since 2018 and is not an ongoing issue.  Plan/Recommendations: - Continue flutter valve - CT chest without contrast in 6 months  Follow up in 6 month.  Marshell Garfinkel MD [facility] Pager [phone] If no answer or after 3pm call: [phone] 03/25/2020, 11:51 AM  CC: Janith Lima, MD

## 2020-04-22 ENCOUNTER — Other Ambulatory Visit: Payer: Self-pay | Admitting: Internal Medicine

## 2020-04-22 DIAGNOSIS — K2100 Gastro-esophageal reflux disease with esophagitis, without bleeding: Secondary | ICD-10-CM

## 2020-04-25 DIAGNOSIS — M79641 Pain in right hand: Secondary | ICD-10-CM | POA: Diagnosis not present

## 2020-05-05 DIAGNOSIS — M25511 Pain in right shoulder: Secondary | ICD-10-CM | POA: Diagnosis not present

## 2020-05-05 DIAGNOSIS — M25531 Pain in right wrist: Secondary | ICD-10-CM | POA: Diagnosis not present

## 2020-05-11 DIAGNOSIS — M25511 Pain in right shoulder: Secondary | ICD-10-CM | POA: Diagnosis not present

## 2020-05-11 DIAGNOSIS — M25531 Pain in right wrist: Secondary | ICD-10-CM | POA: Diagnosis not present

## 2020-05-21 ENCOUNTER — Ambulatory Visit: Payer: Medicare Other | Admitting: Internal Medicine

## 2020-05-28 ENCOUNTER — Encounter: Payer: Self-pay | Admitting: Internal Medicine

## 2020-05-29 ENCOUNTER — Telehealth: Payer: Self-pay | Admitting: Internal Medicine

## 2020-05-29 NOTE — Telephone Encounter (Signed)
Left message on machine to call back  

## 2020-06-05 NOTE — Telephone Encounter (Signed)
No return calls from patient or daughter

## 2020-06-09 ENCOUNTER — Other Ambulatory Visit: Payer: Self-pay | Admitting: Internal Medicine

## 2020-06-09 DIAGNOSIS — N390 Urinary tract infection, site not specified: Secondary | ICD-10-CM

## 2020-06-09 DIAGNOSIS — B9620 Unspecified Escherichia coli [E. coli] as the cause of diseases classified elsewhere: Secondary | ICD-10-CM

## 2020-07-30 ENCOUNTER — Other Ambulatory Visit: Payer: Self-pay

## 2020-07-30 ENCOUNTER — Encounter: Payer: Self-pay | Admitting: Internal Medicine

## 2020-07-30 ENCOUNTER — Ambulatory Visit (INDEPENDENT_AMBULATORY_CARE_PROVIDER_SITE_OTHER): Payer: Medicare Other | Admitting: Internal Medicine

## 2020-07-30 VITALS — BP 148/62 | HR 78 | Temp 98.3°F | Ht 64.5 in | Wt 100.8 lb

## 2020-07-30 DIAGNOSIS — I700 Atherosclerosis of aorta: Secondary | ICD-10-CM

## 2020-07-30 DIAGNOSIS — N898 Other specified noninflammatory disorders of vagina: Secondary | ICD-10-CM | POA: Insufficient documentation

## 2020-07-30 DIAGNOSIS — I10 Essential (primary) hypertension: Secondary | ICD-10-CM | POA: Diagnosis not present

## 2020-07-30 DIAGNOSIS — R64 Cachexia: Secondary | ICD-10-CM | POA: Diagnosis not present

## 2020-07-30 DIAGNOSIS — K2100 Gastro-esophageal reflux disease with esophagitis, without bleeding: Secondary | ICD-10-CM | POA: Diagnosis not present

## 2020-07-30 DIAGNOSIS — M1189 Other specified crystal arthropathies, multiple sites: Secondary | ICD-10-CM | POA: Diagnosis not present

## 2020-07-30 MED ORDER — OMEPRAZOLE 20 MG PO CPDR: 20.0000 mg | DELAYED_RELEASE_CAPSULE | Freq: Every day | ORAL | 1 refills | Status: DC

## 2020-07-30 NOTE — Progress Notes (Signed)
700   Subjective:  Patient ID: Julie Silva, female    DOB: 1927/10/11  Age: 87 y.o. MRN: [id number]  CC: Follow-up  This visit occurred during the SARS-CoV-2 public health emergency.  Safety protocols were in place, including screening questions prior to the visit, additional usage of staff PPE, and extensive cleaning of exam room while observing appropriate contact time as indicated for disinfecting solutions.    HPI Julie Silva presents for f/up - She continues to complain of arthralgias and joint swelling.  When she previously took colchicine she experiences diarrhea.  She would like to try taking a dose every other day.  She continues to lose weight and has mild intermittent diarrhea.  She tells me she will be seeing a gastroenterologist soon.   Current Outpatient Medications:    bimatoprost (LUMIGAN) 0.01 % SOLN, Place 1 drop into both eyes at bedtime., Disp: , Rfl:    Catheters MISC, Replace catheter twice daily., Disp: 180 each, Rfl: 1   Colchicine (MITIGARE) 0.6 MG CAPS, Take 1 tablet by mouth every other day., Disp: 45 capsule, Rfl: 1   dorzolamide-timolol (COSOPT) 22.3-6.8 MG/ML ophthalmic solution, Place 1 drop into both eyes 2 (two) times daily. , Disp: , Rfl:    ELIQUIS 2.5 MG TABS tablet, TAKE 1 TABLET TWICE A DAY, Disp: 180 tablet, Rfl: 0   mirtazapine (REMERON) 15 MG tablet, Take 15 mg by mouth at bedtime., Disp: , Rfl:    omeprazole (PRILOSEC) 20 MG capsule, Take 1 capsule (20 mg total) by mouth daily., Disp: 90 capsule, Rfl: 1   Probiotic Product (PROBIOTIC PO), Take by mouth daily., Disp: , Rfl:    promethazine (PHENERGAN) 12.5 MG tablet, TAKE 1 TABLET (12.5 MG TOTAL) BY MOUTH EVERY 6 (SIX) HOURS AS NEEDED FOR NAUSEA OR VOMITING. (Patient taking differently: Take 12.5 mg by mouth every 6 (six) hours as needed for nausea or vomiting. Only with Kidney pain), Disp: 30 tablet, Rfl: 0   saccharomyces boulardii (FLORASTOR) 250 MG capsule, Take 250 mg by mouth daily., Disp: ,  Rfl:    sulfamethoxazole-trimethoprim (BACTRIM) 200-40 MG/5ML suspension, TAKE 5 MLS BY MOUTH 3 (THREE) TIMES A WEEK., Disp: 60 mL, Rfl: 3   traMADol (ULTRAM) 50 MG tablet, Take 1 tablet (50 mg total) by mouth every 6 (six) hours as needed., Disp: 35 tablet, Rfl: 2    ROS Review of Systems  Constitutional:  Positive for appetite change, fatigue and unexpected weight change. Negative for diaphoresis.  HENT:  Positive for trouble swallowing.   Eyes: Negative.   Respiratory:  Negative for cough, chest tightness, shortness of breath and wheezing.   Cardiovascular:  Negative for chest pain, palpitations and leg swelling.  Gastrointestinal:  Positive for diarrhea. Negative for abdominal pain, blood in stool, nausea and vomiting.  Endocrine: Negative.   Genitourinary:  Positive for vaginal discharge. Negative for difficulty urinating, dysuria, frequency and vaginal bleeding.  Musculoskeletal:  Positive for arthralgias. Negative for myalgias.  Skin: Negative.   Allergic/Immunologic: Negative.   Neurological: Negative.  Negative for dizziness, weakness, light-headedness and headaches.  Hematological:  Negative for adenopathy. Does not bruise/bleed easily.  Psychiatric/Behavioral: Negative.     Objective:  BP (!) 148/62 (BP Location: Right Arm, Patient Position: Sitting, Cuff Size: Normal)   Pulse 78   Temp 98.3 F (36.8 C) (Oral)   Ht 5' 4.5" (1.638 m)   Wt 100 lb 12.8 oz (45.7 kg)   SpO2 99%   BMI 17.04 kg/m   BP Readings from  Last 3 Encounters:  07/30/20 (!) 148/62  03/25/20 (!) 146/72  06/12/19 (!) 168/58    Wt Readings from Last 3 Encounters:  07/30/20 100 lb 12.8 oz (45.7 kg)  03/25/20 104 lb (47.2 kg)  06/12/19 91 lb 8 oz (41.5 kg)    Physical Exam Constitutional:      Appearance: She is cachectic. She is ill-appearing.  HENT:     Nose: Nose normal.     Mouth/Throat:     Mouth: Mucous membranes are moist.  Eyes:     Conjunctiva/sclera: Conjunctivae normal.   Cardiovascular:     Rate and Rhythm: Normal rate and regular rhythm.     Heart sounds: No murmur heard. Pulmonary:     Effort: Pulmonary effort is normal.     Breath sounds: No stridor. No wheezing, rhonchi or rales.  Abdominal:     General: Abdomen is flat. Bowel sounds are normal. There is no distension.     Palpations: Abdomen is soft. There is no hepatomegaly, splenomegaly or mass.     Tenderness: There is no abdominal tenderness.  Musculoskeletal:        General: No swelling or tenderness. Normal range of motion.     Cervical back: Neck supple.     Right lower leg: No edema.     Left lower leg: No edema.  Lymphadenopathy:     Cervical: No cervical adenopathy.  Skin:    General: Skin is warm.  Neurological:     General: No focal deficit present.     Mental Status: She is alert.  Psychiatric:        Mood and Affect: Mood normal.    Lab Results  Component Value Date   WBC 6.7 07/30/2020   HGB 11.4 (L) 07/30/2020   HCT 35.3 (L) 07/30/2020   PLT 246.0 07/30/2020   GLUCOSE 89 07/30/2020   CHOL 168 04/04/2016   TRIG 105.0 04/04/2016   HDL 58.10 04/04/2016   LDLCALC 89 04/04/2016   ALT 9 07/30/2020   AST 19 07/30/2020   NA 141 07/30/2020   K 4.3 07/30/2020   CL 104 07/30/2020   CREATININE 0.83 07/30/2020   BUN 15 07/30/2020   CO2 29 07/30/2020   TSH 3.72 07/30/2020   INR 0.9 07/03/2012   HGBA1C 5.5 05/07/2013    CT Chest Wo Contrast  Result Date: 03/19/2020 CLINICAL DATA:  Abnormal outside chest radiograph with lung opacities. EXAM: CT CHEST WITHOUT CONTRAST TECHNIQUE: Multidetector CT imaging of the chest was performed following the standard protocol without IV contrast. COMPARISON:  12/11/2018 chest radiograph.  07/31/2015 chest CT. FINDINGS: Cardiovascular: Normal heart size. No significant pericardial effusion/thickening. Left anterior descending coronary atherosclerosis. Lead less pacemaker in the right ventricle. Atherosclerotic nonaneurysmal thoracic aorta.  Normal caliber pulmonary arteries. Mediastinum/Nodes: No discrete thyroid nodules. Unremarkable esophagus. No axillary adenopathy. Coarsely calcified nonenlarged right paratracheal nodes are unchanged and compatible with prior granulomatous disease. No pathologically enlarged mediastinal or discrete hilar nodes on these noncontrast images. Lungs/Pleura: No pneumothorax. No pleural effusion. Widespread moderate cylindrical and varicoid bronchiectasis in both lungs, most prominent in the right middle lobe, not substantially changed. Associated moderate patchy tree-in-bud opacities and indistinct peribronchovascular nodular foci of consolidation at the areas of bronchiectasis, overall mildly improved since 07/31/2015 chest CT, although with some waxing and waning change. For example a new cavitary 2.1 cm right lower lobe peribronchovascular nodule (series 5/image 80). A right upper lobe 1.3 cm peribronchovascular nodular focus of consolidation (series 5/image 56) is new, although the  more peripheral patchy tree-in-bud opacities have improved. Near complete bandlike scarring in the right middle lobe is mildly worsened. Upper abdomen: Cholecystectomy. Pneumobilia is unchanged and compatible with presumed history of sphincterotomy. Granulomatous splenic calcifications are unchanged. Musculoskeletal: No aggressive appearing focal osseous lesions. Mild thoracic spondylosis. IMPRESSION: 1. Spectrum of findings most compatible with chronic atypical mycobacterial infection (MAI) with widespread moderate bronchiectasis, patchy tree-in-bud opacities and indistinct nodular peribronchovascular foci of consolidation. Findings are overall improved since 07/31/2015 chest CT, although with some waxing and waning change, including a new 2.1 cm cavitary right lower lobe peribronchovascular nodule, favored to be due to MAI. Follow-up high-resolution chest CT suggested in 3-6 months. 2. One vessel coronary atherosclerosis. 3. Aortic  Atherosclerosis (ICD10-I70.0). Electronically Signed   By: Ilona Sorrel M.D.   On: 03/19/2020 20:45    Assessment & Plan:   Rumor was seen today for follow-up.  Diagnoses and all orders for this visit:  Essential hypertension, benign- Her BP is well controlled. -     CBC with Differential/Platelet; Future -     Basic metabolic panel; Future -     TSH; Future -     Hepatic function panel; Future -     Hepatic function panel -     TSH -     Basic metabolic panel -     CBC with Differential/Platelet  Pseudogout involving multiple joints -     Colchicine (MITIGARE) 0.6 MG CAPS; Take 1 tablet by mouth every other day.  Cachexia ([place]) -     CBC with Differential/Platelet; Future -     Basic metabolic panel; Future -     TSH; Future -     Hepatic function panel; Future -     Hepatic function panel -     TSH -     Basic metabolic panel -     CBC with Differential/Platelet  Vaginal discharge -     Ambulatory referral to Gynecology  Gastroesophageal reflux disease with esophagitis without hemorrhage- She wants to lower the PPI dose. -     omeprazole (PRILOSEC) 20 MG capsule; Take 1 capsule (20 mg total) by mouth daily.  Atherosclerosis of aorta ([place])- Risk factor modifications addressed.  I have discontinued Gabi S. Penado's omeprazole. I am also having her start on omeprazole and Colchicine. Additionally, I am having her maintain her bimatoprost, dorzolamide-timolol, Catheters, promethazine, Probiotic Product (PROBIOTIC PO), Eliquis, saccharomyces boulardii, traMADol, mirtazapine, and sulfamethoxazole-trimethoprim.  Meds ordered this encounter  Medications   omeprazole (PRILOSEC) 20 MG capsule    Sig: Take 1 capsule (20 mg total) by mouth daily.    Dispense:  90 capsule    Refill:  1   Colchicine (MITIGARE) 0.6 MG CAPS    Sig: Take 1 tablet by mouth every other day.    Dispense:  45 capsule    Refill:  1      Follow-up: Return if symptoms worsen or fail to  improve.  Scarlette Calico, MD

## 2020-07-30 NOTE — Patient Instructions (Signed)
Calcium Pyrophosphate Deposition Calcium pyrophosphate deposition (CPPD) is a type of arthritis that causes pain, swelling, and inflammation in a joint. Attacks of CPPD may come and go. The joint pain can be severe and may last for days to weeks. This condition usually affects one joint at a time. The knees are most often affected, butthis condition can also affect the wrists, elbows, shoulders, or ankles. CPPD may also be called pseudogout because it is similar to gout. Both conditions result from the buildup of crystals in a joint. However, CPPD iscaused by a type of crystal that is different from the crystals that cause gout. What are the causes? This condition is caused by the buildup of calcium pyrophosphate dihydrate crystals in a joint. The reason why this buildup occurs is not known. An increased likelihood of having this condition (predisposition) may be passed from parent to child (is hereditary). What increases the risk? This condition is more likely to develop in people who: Are older than 85 years of age. Have a family history of CPPD. Have certain medical conditions, such as hemophilia, amyloidosis, or overactive parathyroid glands. Have low levels of magnesium in the blood. What are the signs or symptoms? Symptoms of this condition include: Joint pain. The pain may: Be intense and constant. Develop quickly. Get worse with movement. Last from several days to a few weeks. Redness, swelling, stiffness, and warmth at the joint. How is this diagnosed? To diagnose this condition, your health care provider will use a needle to remove fluid from the joint. The fluid will be examined for the crystals that cause CPPD. You also may have additional tests, such as: Blood tests. X-rays. Ultrasound. MRI. How is this treated? There is no way to remove the crystals from the joint and no cure for this condition. However, treatment can relieve symptoms and improve joint function. Treatment may  include: NSAIDs to reduce inflammation and pain. Removing some of the fluid and crystals from around the joint with a needle. Injections of medicine (cortisone) into the joint to reduce pain and swelling. Medicines to help prevent attacks. Physical therapy to improve joint function. Follow these instructions at home: Managing pain, stiffness, and swelling  Rest the affected joint until your symptoms start to go away. If directed, put ice on the affected area to relieve pain and swelling: Put ice in a plastic bag. Place a towel between your skin and the bag. Leave the ice on for 20 minutes, 2-3 times a day. Keep your affected joint raised (elevated) above the level of your heart, when possible. This will help to reduce swelling. For example, prop your foot up on a chair while sitting down to elevate your knee.  General instructions If the painful joint is in your leg, use crutches as told by your health care provider. Take over-the-counter and prescription medicines only as told by your health care provider. When your symptoms start to go away, begin to exercise regularly or do physical therapy. Talk with your health care provider or physical therapist about what types of exercise are safe for you. Exercise that is easier on your joints (low-impact exercise) may be best. This includes walking, swimming, bicycling, and water aerobics. Maintain a healthy weight. Excess weight puts stress on your joints. Keep all follow-up visits as told by your health care provider and physical therapist. This is important. Contact a health care provider if you: Notice that your symptoms get worse. Develop a skin rash. Notice that your pain gets worse. Get help  right away if you: Have a fever. Have difficulty breathing. Are taking NSAIDs and you: Vomit blood. Have blood in your stool. Have stool that is tarry and black. Summary Calcium pyrophosphate deposition (CPPD) is a type of arthritis that causes  pain, swelling, and inflammation in a joint. The knees are most often affected, but it can also affect the wrists, elbows, shoulders, or ankles. CPPD is caused by the buildup of calcium crystals in a joint. The reason why this occurs is not known. Attacks of CPPD may come and go. The joint pain can be severe and may last for days to weeks. There is no way to remove the crystals from the joint and no cure for this condition. However, treatment can relieve symptoms and improve joint function. Rest the affected joint until your symptoms start to go away. After your symptoms go away, begin to exercise regularly or do physical therapy. This information is not intended to replace advice given to you by your health care provider. Make sure you discuss any questions you have with your healthcare provider. Document Revised: 04/16/2019 Document Reviewed: 04/16/2019 Elsevier Patient Education  [place].

## 2020-07-31 DIAGNOSIS — I700 Atherosclerosis of aorta: Secondary | ICD-10-CM | POA: Insufficient documentation

## 2020-07-31 LAB — BASIC METABOLIC PANEL
BUN: 15 mg/dL (ref 6–23)
CO2: 29 mEq/L (ref 19–32)
Calcium: 9 mg/dL (ref 8.4–10.5)
Chloride: 104 mEq/L (ref 96–112)
Creatinine, Ser: 0.83 mg/dL (ref 0.40–1.20)
GFR: 61.75 mL/min (ref >60.00)
Glucose, Bld: 89 mg/dL (ref 70–99)
Potassium: 4.3 mEq/L (ref 3.5–5.1)
Sodium: 141 mEq/L (ref 135–145)

## 2020-07-31 LAB — CBC WITH DIFFERENTIAL/PLATELET
Basophils Absolute: 0.1 10*3/uL (ref 0.0–0.1)
Basophils Relative: 0.8 % (ref 0.0–3.0)
Eosinophils Absolute: 0.1 10*3/uL (ref 0.0–0.7)
Eosinophils Relative: 1.5 % (ref 0.0–5.0)
HCT: 35.3 % — ABNORMAL LOW (ref 36.0–46.0)
Hemoglobin: 11.4 g/dL — ABNORMAL LOW (ref 12.0–15.0)
Lymphocytes Relative: 44.6 % (ref 12.0–46.0)
Lymphs Abs: 3 10*3/uL (ref 0.7–4.0)
MCHC: 32.4 g/dL (ref 30.0–36.0)
MCV: 95.6 fl (ref 78.0–100.0)
Monocytes Absolute: 0.9 10*3/uL (ref 0.1–1.0)
Monocytes Relative: 13.2 % — ABNORMAL HIGH (ref 3.0–12.0)
Neutro Abs: 2.7 10*3/uL (ref 1.4–7.7)
Neutrophils Relative %: 39.9 % — ABNORMAL LOW (ref 43.0–77.0)
Platelets: 246 10*3/uL (ref 150.0–400.0)
RBC: 3.69 Mil/uL — ABNORMAL LOW (ref 3.87–5.11)
RDW: 14.6 % (ref 11.5–15.5)
WBC: 6.7 10*3/uL (ref 4.0–10.5)

## 2020-07-31 LAB — HEPATIC FUNCTION PANEL
ALT: 9 U/L (ref 0–35)
AST: 19 U/L (ref 0–37)
Albumin: 3.7 g/dL (ref 3.5–5.2)
Alkaline Phosphatase: 71 U/L (ref 39–117)
Bilirubin, Direct: 0.1 mg/dL (ref 0.0–0.3)
Total Bilirubin: 0.5 mg/dL (ref 0.2–1.2)
Total Protein: 6.8 g/dL (ref 6.0–8.3)

## 2020-07-31 LAB — TSH: TSH: 3.72 u[IU]/mL (ref 0.35–5.50)

## 2020-08-02 MED ORDER — COLCHICINE 0.6 MG PO CAPS: 1.0000 | ORAL_CAPSULE | ORAL | 1 refills | Status: DC

## 2020-08-02 MED ORDER — TRAMADOL HCL 50 MG PO TABS: 50.0000 mg | ORAL_TABLET | Freq: Four times a day (QID) | ORAL | 2 refills | Status: DC | PRN

## 2020-08-24 ENCOUNTER — Ambulatory Visit (INDEPENDENT_AMBULATORY_CARE_PROVIDER_SITE_OTHER): Payer: Medicare Other

## 2020-08-24 ENCOUNTER — Other Ambulatory Visit: Payer: Self-pay

## 2020-08-24 ENCOUNTER — Ambulatory Visit (INDEPENDENT_AMBULATORY_CARE_PROVIDER_SITE_OTHER): Payer: Medicare Other | Admitting: Internal Medicine

## 2020-08-24 ENCOUNTER — Encounter: Payer: Self-pay | Admitting: Internal Medicine

## 2020-08-24 VITALS — BP 122/80 | HR 71 | Resp 18 | Ht 64.5 in

## 2020-08-24 DIAGNOSIS — M25551 Pain in right hip: Secondary | ICD-10-CM | POA: Insufficient documentation

## 2020-08-24 MED ORDER — METHOCARBAMOL 500 MG PO TABS
500.0000 mg | ORAL_TABLET | Freq: Two times a day (BID) | ORAL | 0 refills | Status: DC | PRN
Start: 2020-08-24 — End: 2021-03-16

## 2020-08-24 NOTE — Assessment & Plan Note (Signed)
Concern for fracture given degree of pain on exam. Advised to continue scheduling tylenol every 6 hours. Ordered stat right hip x-ray. Rx robaxin to take if needed if tylenol ineffective. Treat as appropriate. Depending on x-ray results if fracture will ask to hold eliquis.

## 2020-08-24 NOTE — Progress Notes (Signed)
   Subjective:   Patient ID: Julie Silva, female    DOB: 1927/11/29, 87 y.o.   MRN: [id number]  HPI The patient is a 87 YO female coming in for right hip pain from fall yesterday. Taking tylenol for pain which is helping some. She has unbearable pain with certain movements and with trying to bear weight. They had an old wheelchair they have been using since yesterday. Lives alone 4 supportive daughters, at least 1 staying with her since fall. Denies LOC or hitting head. No headaches. Mild bruising to right arm no injury to that. Takes eliquis last dose this morning next due 9 pm tonight. She does get some dizziness with standing and just had nothing to catch herself on with this fall but no prior falls.   Review of Systems  Constitutional: Negative.   HENT: Negative.    Eyes: Negative.   Respiratory:  Negative for cough, chest tightness and shortness of breath.   Cardiovascular:  Negative for chest pain, palpitations and leg swelling.  Gastrointestinal:  Negative for abdominal distention, abdominal pain, constipation, diarrhea, nausea and vomiting.  Musculoskeletal:  Positive for arthralgias and myalgias.  Skin: Negative.   Neurological: Negative.   Psychiatric/Behavioral: Negative.     Objective:  Physical Exam Constitutional:      Appearance: She is well-developed.     Comments: Thin, hard of hearing  HENT:     Head: Normocephalic and atraumatic.  Cardiovascular:     Rate and Rhythm: Normal rate.  Pulmonary:     Effort: Pulmonary effort is normal. No respiratory distress.     Breath sounds: Normal breath sounds. No wheezing or rales.  Abdominal:     General: Bowel sounds are normal. There is no distension.     Palpations: Abdomen is soft.     Tenderness: There is no abdominal tenderness. There is no rebound.  Musculoskeletal:     Cervical back: Normal range of motion.  Skin:    General: Skin is warm and dry.  Neurological:     Mental Status: She is alert and oriented to  person, place, and time.     Coordination: Coordination abnormal.     Comments: Wheelchair, severe pain with manipulation of the right leg.     Vitals:   08/24/20 1524  BP: 122/80  Pulse: 71  Resp: 18  SpO2: 95%  Height: 5' 4.5" (1.638 m)    This visit occurred during the SARS-CoV-2 public health emergency.  Safety protocols were in place, including screening questions prior to the visit, additional usage of staff PPE, and extensive cleaning of exam room while observing appropriate contact time as indicated for disinfecting solutions.   Assessment & Plan:

## 2020-08-24 NOTE — Patient Instructions (Signed)
We will check the x-ray of the hip and have sent in robaxin for a muscle relaxer.

## 2020-08-31 DIAGNOSIS — Z01419 Encounter for gynecological examination (general) (routine) without abnormal findings: Secondary | ICD-10-CM | POA: Diagnosis not present

## 2020-08-31 DIAGNOSIS — N950 Postmenopausal bleeding: Secondary | ICD-10-CM | POA: Diagnosis not present

## 2020-09-11 DIAGNOSIS — H26492 Other secondary cataract, left eye: Secondary | ICD-10-CM | POA: Diagnosis not present

## 2020-09-11 DIAGNOSIS — H353132 Nonexudative age-related macular degeneration, bilateral, intermediate dry stage: Secondary | ICD-10-CM | POA: Diagnosis not present

## 2020-09-16 DIAGNOSIS — T83721A Exposure of implanted vaginal mesh and other prosthetic materials into vagina, initial encounter: Secondary | ICD-10-CM | POA: Diagnosis not present

## 2020-09-16 DIAGNOSIS — R82998 Other abnormal findings in urine: Secondary | ICD-10-CM | POA: Diagnosis not present

## 2020-09-25 ENCOUNTER — Ambulatory Visit
Admission: RE | Admit: 2020-09-25 | Discharge: 2020-09-25 | Disposition: A | Discharge status: Home / Self Care | Payer: Medicare Other | Source: Ambulatory Visit | Attending: Pulmonary Disease | Admitting: Pulmonary Disease

## 2020-09-25 DIAGNOSIS — I700 Atherosclerosis of aorta: Secondary | ICD-10-CM | POA: Diagnosis not present

## 2020-09-25 DIAGNOSIS — R911 Solitary pulmonary nodule: Secondary | ICD-10-CM | POA: Diagnosis not present

## 2020-09-25 DIAGNOSIS — R9389 Abnormal findings on diagnostic imaging of other specified body structures: Secondary | ICD-10-CM

## 2020-09-30 ENCOUNTER — Ambulatory Visit (INDEPENDENT_AMBULATORY_CARE_PROVIDER_SITE_OTHER): Payer: Medicare Other | Admitting: Pulmonary Disease

## 2020-09-30 ENCOUNTER — Other Ambulatory Visit: Payer: Self-pay

## 2020-09-30 ENCOUNTER — Encounter: Payer: Self-pay | Admitting: Pulmonary Disease

## 2020-09-30 VITALS — BP 128/66 | HR 75 | Ht 64.0 in | Wt 99.2 lb

## 2020-09-30 DIAGNOSIS — R9389 Abnormal findings on diagnostic imaging of other specified body structures: Secondary | ICD-10-CM | POA: Diagnosis not present

## 2020-09-30 NOTE — Patient Instructions (Signed)
I have reviewed your CT scan which shows some increase in inflammation and increase in cavity suggestive of ongoing infection with MAI Per our discussion we will not pursue this aggressively Return to clinic in 1 year.

## 2020-09-30 NOTE — Progress Notes (Signed)
Julie Silva    [id number]    November 20, 1927  Primary Care Physician:Jones, Arvid Right, MD  Referring Physician: Janith Lima, MD [address] [place],  [place] [address]  Chief complaint: Follow up for abnormal CT scan.  HPI: Julie Silva was previously followed in the pulmonary clinic for abnormal CT with bronchiectatic, tree-in-bud changes concerning for MAI.  She was last seen in 2018 At that time she was also on chronic nitrofurantoin for UTI for many years for chronic UTI  Recommendations were for conservative management as she was asymptomatic and not producing sputum Bronchoscopy or treatment for MAI was not recommended due to age, frailty, malnutrition with weight loss Suspicion for nitrofurantoin toxicity was low but she was taken off this medication and placed on Bactrim for UTI prophylaxis. She denies any signs and symptoms of connective tissue disease, autoimmune disease.  She is a lifelong non-smoker.  Interim History:  Returns to clinic after 4 years for new abnormal lung imaging findings She recently had significant symptomatic bradycardia and had wireless pacemaker placed at [facility] Chest x-ray post procedure showed some new apical opacities  She had a CT scan is here for review States that she is doing well as before with no respiratory symptoms.  Denies any cough, sputum production, dyspnea   Allergies as of 03/25/2020 - Review Complete 03/25/2020  Allergen Reaction Noted   Codeine Other (See Comments) 02/02/2013    Past Medical History:  Diagnosis Date   Anemia    Arthritis    Osteoporosis, neck stiffness   Atrial fibrillation ([place])    Bleeds easily [facility])    "father was a hemophiliac" - pt not being bothered in recent years.   Calcium deposit in bursa of knee 2017   Cancer ([place])    skin cancer "basal cell".   Chicken pox    Cyst, Baker's knee    left knee- tx. Cortisone injection 05-05-15 in office- "improved pain relief and swelling left  ankle and knee"   Diverticulitis    Dysrhythmia    Dr. McAlhany-cardiology   GERD (gastroesophageal reflux disease)    Glaucoma of both eyes    Gout    Hearing loss of both ears    Bilateral ears- hearing aids used- Silva ear hearing betther than left.   History of blood transfusion 1957   "lots; most related to my periods"-last 1968 -s/p Hysterectomy   Migraines    "years ago" (02/04/2013)   Motion sickness    back seat - cars, sudden movements   PONV (postoperative nausea and vomiting)    Risk for falls    "wobbley" per daughter   Self-catheterizes urinary bladder    "daily- retained urine and chronic UTI"   SVT (supraventricular tachycardia) ([place])    Dr. Darnell Level. [place]-follows "loop recorder left chest"   Swallowing difficulty    freq occ. mostley liquids   Urine incontinence    UTI (urinary tract infection)    Chronic Urinary tract infections- tx Macrobid daily.   Vertigo    Wears hearing aid in both ears     Past Surgical History:  Procedure Laterality Date   [place]   Repair-    BREAST BIOPSY Bilateral    "both were fine"   CATARACT EXTRACTION W/ INTRAOCULAR LENS IMPLANT Left    CATARACT EXTRACTION W/PHACO Silva 10/16/2018   Procedure: CATARACT EXTRACTION PHACO AND INTRAOCULAR LENS PLACEMENT (IOC)  Silva  MiLoop diabetes 01:05.8  21.0%  13.77;  Surgeon: Birder Robson, MD;  Location: [facility];  Service: Ophthalmology;  Laterality: Silva;  BS tends to drop in AM and would like early surgery so she can get home to eat, please   CHOLECYSTECTOMY  2014   COMBINED HYSTERECTOMY ABDOMINAL W/ A&P REPAIR / OOPHORECTOMY     DILATION AND CURETTAGE OF UTERUS     EP IMPLANTABLE DEVICE N/A 06/19/2014   Procedure: Loop Recorder Insertion;  Surgeon: Evans Lance, MD;  Location: [facility];  Service: Cardiovascular;  Laterality: N/A;   EXCISION OF BREAST BIOPSY Silva 05/13/2015   Procedure: Silva BREAST EXCISIONAL BIOPSY x2;   Surgeon: Armandina Gemma, MD;  Location: [facility];  Service: General;  Laterality: Silva;   TONSILLECTOMY  1946   TOTAL ABDOMINAL HYSTERECTOMY     VAGINAL HYSTERECTOMY  1968    Family History  Problem Relation Age of Onset   Stroke Father    CVA Father    Ovarian cancer Mother    Cancer - Other Sister        Breast   Breast cancer Sister    Cancer - Other Brother        Throat   Cancer - Other Sister        Throat   Brain cancer Sister    Cancer - Other Other        Brain   Breast cancer Other     Social History   Socioeconomic History   Marital status: Widowed    Spouse name: Not on file   Number of children: 4   Years of education: 12   Highest education level: Not on file  Occupational History   Occupation: Runner, broadcasting/film/video  Tobacco Use   Smoking status: Never Smoker   Smokeless tobacco: Never Used  Scientific laboratory technician Use: Never used  Substance and Sexual Activity   Alcohol use: No    Alcohol/week: 0.0 standard drinks   Drug use: No   Sexual activity: Never  Other Topics Concern   Not on file  Social History Narrative   Lives alone in a one story home.  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    Social Determinants of Health   Financial Resource Strain: Not on file  Food Insecurity: Not on file  Transportation Needs: Not on file  Physical Activity: Not on file  Stress: Not on file  Social Connections: Not on file  Intimate Partner Violence: Not on file    Review of systems: Review of Systems  Constitutional: Negative for fever and chills.  HENT: Negative.   Eyes: Negative for blurred vision.  Respiratory: as per HPI  Cardiovascular: Negative for chest pain and palpitations.  Gastrointestinal: Negative for vomiting, diarrhea, blood per rectum. Genitourinary: Negative for dysuria, urgency, frequency and hematuria.  Musculoskeletal: Negative for myalgias, back pain and joint pain.  Skin: Negative for itching and rash.  Neurological: Negative  for dizziness, tremors, focal weakness, seizures and loss of consciousness.  Endo/Heme/Allergies: Negative for environmental allergies.  Psychiatric/Behavioral: Negative for depression, suicidal ideas and hallucinations.  All other systems reviewed and are negative.  Physical Exam: Blood pressure (!) 146/72, pulse 76, temperature (!) 97.3 F (36.3 C), temperature source Temporal, height 5' 4.5" (1.638 m), weight 104 lb (47.2 kg), SpO2 96 %. Gen:      No acute distress, frail, elderly HEENT:  EOMI, sclera anicteric Neck:     No masses; no thyromegaly Lungs:  Clear to auscultation bilaterally; normal respiratory effort CV:         Regular rate and rhythm; no murmurs Abd:      + bowel sounds; soft, non-tender; no palpable masses, no distension Ext:    No edema; adequate peripheral perfusion Skin:      Warm and dry; no rash Neuro: alert and oriented x 3 Psych: normal mood and affect  Data Reviewed: CT scan  05/01/15- diffuse emphysematous changes, bilateral consolidative nodular opacities in the lower lobes, Silva middle lobe bronchiectasis, tree-in-bud opacity.   CT scan 07/31/15-  similar to above. Some consolidative changes are worse when others show improvement. Images personally reviewed.   CT chest 03/19/2020-moderate bilateral bronchiectasis unchanged from before, persistent tree-in-bud with cavitary 2.1 cm Silva lower lobe nodule and a new 1.3 cm nodular consolidation in the Silva upper lobe.  High-resolution CT 09/25/2020-bronchiectasis, fibrosis in the Silva middle lobe, enlargement of Silva lower lobe cavitary lesion with new Lesion in the left lower lobe. I have reviewed the images personally.  Assessment:  Bronchiectasis, concern for MAI She has predominantly Silva middle lobe bronchiectasis, tree-in-bud opacities and consolidative changes in the bases. This is suggestive of MAI infection. She is using a flutter valve but she is unable to bring up secretions.  I have reviewed  the latest CT scan with waxing and waning tree-in-bud opacities suggestive of ongoing infection.  The cavitary lesions are worse on the most recent CT scan.  Cannot rule out malignancy with a cavitary and new nodular lesion but the chances of that are low as she is a lifelong non-smoker.  We had extensive discussion with her and daughter in office at last visit and today.  As before the concern is that with her frailty and age she would not tolerate a bronchoscope or treatment for MAI if confirmed.  Return to clinic in 1 year She has been off nitrofurantoin since 2018 and is not an ongoing issue.  Plan/Recommendations: - Continue flutter valve - Return to clinic in 1 year  Follow up in 6 month.  Marshell Garfinkel MD [facility] Pager [phone] If no answer or after 3pm call: [phone] 09/30/2020, 2:43 PM  CC: Julie Lima, MD

## 2020-10-05 DIAGNOSIS — Z85828 Personal history of other malignant neoplasm of skin: Secondary | ICD-10-CM | POA: Diagnosis not present

## 2020-10-05 DIAGNOSIS — D485 Neoplasm of uncertain behavior of skin: Secondary | ICD-10-CM | POA: Diagnosis not present

## 2020-10-05 DIAGNOSIS — L720 Epidermal cyst: Secondary | ICD-10-CM | POA: Diagnosis not present

## 2020-10-05 DIAGNOSIS — L821 Other seborrheic keratosis: Secondary | ICD-10-CM | POA: Diagnosis not present

## 2020-10-17 ENCOUNTER — Other Ambulatory Visit: Payer: Self-pay | Admitting: Internal Medicine

## 2020-10-17 DIAGNOSIS — K2100 Gastro-esophageal reflux disease with esophagitis, without bleeding: Secondary | ICD-10-CM

## 2020-10-28 ENCOUNTER — Encounter: Payer: Self-pay | Admitting: *Deleted

## 2020-10-29 ENCOUNTER — Other Ambulatory Visit (INDEPENDENT_AMBULATORY_CARE_PROVIDER_SITE_OTHER): Payer: Medicare Other

## 2020-10-29 ENCOUNTER — Encounter: Payer: Self-pay | Admitting: Internal Medicine

## 2020-10-29 ENCOUNTER — Ambulatory Visit (INDEPENDENT_AMBULATORY_CARE_PROVIDER_SITE_OTHER): Payer: Medicare Other | Admitting: Internal Medicine

## 2020-10-29 VITALS — BP 140/62 | HR 94 | Ht 64.0 in | Wt 99.5 lb

## 2020-10-29 DIAGNOSIS — R10814 Left lower quadrant abdominal tenderness: Secondary | ICD-10-CM | POA: Diagnosis not present

## 2020-10-29 DIAGNOSIS — R197 Diarrhea, unspecified: Secondary | ICD-10-CM

## 2020-10-29 LAB — BUN: BUN: 19 mg/dL (ref 6–23)

## 2020-10-29 LAB — CREATININE, SERUM: Creatinine, Ser: 0.91 mg/dL (ref 0.40–1.20)

## 2020-10-29 NOTE — Progress Notes (Signed)
Subjective:    Patient ID: Julie Silva, female    DOB: 1927/10/06, 87 y.o.   MRN: [id number]  HPI Elea Yamaguchi is a 87 year old female with a history of GERD, diverticulosis, intermittent diarrhea (felt to be possibly overflow related when seen in the office 18 months ago), atrial fibrillation on Eliquis, diastolic dysfunction, chronic urinary incontinence on suppressive antibiotics, bronchiectasis with probable MAI, bradycardia with pacemaker insertion, glaucoma who is here for follow-up.  She reports that she continues to have episodes of severe and uncontrollable diarrhea.  Diarrhea is nonbloody.  This can happen somewhat randomly but tends to happen less than every 1 to 2 weeks.  There is watery urgent stools which she has a hard time controlling.  This often makes a mess in the bathroom requiring her to shower and change of clothing.  It can take 5 to 7 days to recover from such an episode.  It makes her feel wiped out.  At times she is afraid to leave her house because these episodes have been unpredictable.  She did try Benefiber which her daughter felt was working very well but eventually stopped doing this as frequently because there was some question of whether it was causing diarrhea.  Outside of episodes she states she usually has a bowel movement daily though at times they are not complete and small.  They are usually soft solid.  Appetite is never really strong though she does eat.  She drinks boost twice daily.  Her daughter states that her diet is light but carb heavy.  When she was seen here 18 months ago weight was 93 pounds but got up to 103 and is now 99.  She will occasionally use liquid Imodium to control looser stool.   Review of Systems As per HPI, otherwise negative  Current Medications, Allergies, Past Medical History, Past Surgical History, Family History and Social History were reviewed in [employer] record.     Objective:   Physical Exam Ht 5'  4" (1.626 m)   Wt 99 lb 8 oz (45.1 kg)   BMI 17.08 kg/m  Gen: awake, alert, NAD HEENT: anicteric Abd: soft, NT/ND, +BS throughout Rectal: No external masses or lesions, reduced anal sphincter tone, soft formed brown stool in the vault of moderate amount, no masses, pelvic exam present with valsalva. Ext: no c/c/e Neuro: nonfocal  CBC    Component Value Date/Time   WBC 6.7 07/30/2020 1629   RBC 3.69 (L) 07/30/2020 1629   HGB 11.4 (L) 07/30/2020 1629   HGB 12.4 02/23/2018 1525   HCT 35.3 (L) 07/30/2020 1629   HCT 36.2 02/23/2018 1525   PLT 246.0 07/30/2020 1629   PLT 239 02/23/2018 1525   MCV 95.6 07/30/2020 1629   MCV 93 02/23/2018 1525   MCV 93 07/03/2012 1533   MCH 31.9 02/23/2018 1525   MCH 30.9 05/08/2015 0930   MCHC 32.4 07/30/2020 1629   RDW 14.6 07/30/2020 1629   RDW 12.4 02/23/2018 1525   RDW 13.1 07/03/2012 1533   LYMPHSABS 3.0 07/30/2020 1629   LYMPHSABS 1.7 02/23/2018 1525   LYMPHSABS 1.2 07/03/2012 1533   MONOABS 0.9 07/30/2020 1629   MONOABS 0.6 07/03/2012 1533   EOSABS 0.1 07/30/2020 1629   EOSABS 0.1 02/23/2018 1525   EOSABS 0.1 07/03/2012 1533   BASOSABS 0.1 07/30/2020 1629   BASOSABS 0.0 02/23/2018 1525   BASOSABS 0.1 07/03/2012 1533   CMP     Component Value Date/Time   NA 141 07/30/2020  1629   NA 142 02/23/2018 1525   K 4.3 07/30/2020 1629   CL 104 07/30/2020 1629   CO2 29 07/30/2020 1629   GLUCOSE 89 07/30/2020 1629   BUN 15 07/30/2020 1629   BUN 17 02/23/2018 1525   CREATININE 0.83 07/30/2020 1629   CALCIUM 9.0 07/30/2020 1629   PROT 6.8 07/30/2020 1629   ALBUMIN 3.7 07/30/2020 1629   AST 19 07/30/2020 1629   ALT 9 07/30/2020 1629   ALKPHOS 71 07/30/2020 1629   BILITOT 0.5 07/30/2020 1629   GFRNONAA 61 02/23/2018 1525   GFRAA 70 02/23/2018 1525          Assessment & Plan:  87 year old female with a history of GERD, diverticulosis, intermittent diarrhea (felt to be possibly overflow related when seen in the office 18 months  ago), atrial fibrillation on Eliquis, diastolic dysfunction, chronic urinary incontinence on suppressive antibiotics, bronchiectasis with probable MAI, bradycardia with pacemaker insertion, glaucoma who is here for follow-up.  Intermittent diarrhea/overflow --my suspicion remains that she is having incomplete evacuation over time leading to uncontrollable overflow diarrheal events.  After this occurs the cycle starts over and the diarrhea seems to be "gone" for a matter of time.  Her exam is benign.  There is solid soft stool in the rectum arguing against complete evacuation (she had a bowel movement this morning) which is supportive of presumed diagnosis.  We talked about using fiber on a more consistent basis and consideration of an enema weekly versus a suppository to try to help complete evacuation and avoid overflow events. --Resume Benefiber 2 teaspoons working up to twice daily --CT scan abdomen pelvis --rule out overt/gross colonic pathology --Fleets enema versus Dulcolax suppository weekly --Follow-up with me in 3 months --Could consider pelvic floor physical therapy in the future  2. Weight --continue boost nutritional supplements.  Her weight is actually up nearly 7 pounds since she was seen here 18 months ago.  This is a positive sign and argues against more ominous pathology.  She certainly remains somewhat underweight and we discussed diet and nutritional supplements.  40 minutes total spent today including patient facing time, coordination of care, reviewing medical history/procedures/pertinent radiology studies, and documentation of the encounter.

## 2020-10-29 NOTE — Patient Instructions (Addendum)
Your provider has requested that you go to the basement level for lab work before leaving today. Press "B" on the elevator. The lab is located at the first door on the left as you exit the elevator. ______________________________________________________  Julie Silva have been scheduled for a follow up with Dr Hilarie Fredrickson on Monday, 02/01/21 @ 11:00 am. ______________________________________________________ Please purchase the following medications over the counter and take as directed: Fleets enema OR dulcolax suppository-once per week Benefiber-2 teaspoons 1-2 times daily _______________________________________________________  Julie Silva have been scheduled for a CT scan of the abdomen and pelvis at [facility] (1st floor of hospital).   You are scheduled on November 09, 2020 at 8:30 am. You should arrive 15 minutes prior to your appointment time for registration. Please follow the written instructions below on the day of your exam:  WARNING: IF YOU ARE ALLERGIC TO IODINE/X-RAY DYE, PLEASE NOTIFY RADIOLOGY IMMEDIATELY AT [phone]! YOU WILL BE GIVEN A 13 HOUR PREMEDICATION PREP.  1) Do not eat or drink anything after 4:30 am (4 hours prior to your test) 2) You have been given 2 bottles of oral contrast to drink. The solution may taste better if refrigerated, but do NOT add ice or any other liquid to this solution. Shake well before drinking.    Drink 1 bottle of contrast @ 6:30 am (2 hours prior to your exam)  Drink 1 bottle of contrast @ 7:30 am (1 hour prior to your exam)  You may take any medications as prescribed with a small amount of water, if necessary. If you take any of the following medications: METFORMIN, GLUCOPHAGE, GLUCOVANCE, AVANDAMET, RIOMET, FORTAMET, [place] MET, JANUMET, GLUMETZA or METAGLIP, you MAY be asked to HOLD this medication 48 hours AFTER the exam.  The purpose of you drinking the oral contrast is to aid in the visualization of your intestinal tract. The contrast  solution may cause some diarrhea. Depending on your individual set of symptoms, you may also receive an intravenous injection of x-ray contrast/dye. Plan on being at [facility] for 30 minutes or longer, depending on the type of exam you are having performed.  This test typically takes 30-45 minutes to complete.  If you have any questions regarding your exam or if you need to reschedule, you may call the CT department at [phone] between the hours of 8:00 am and 5:00 pm, Monday-Friday.  ________________________________________________________  If you are age 53 or older, your body mass index should be between 23-30. Your Body mass index is 17.08 kg/m. If this is out of the aforementioned range listed, please consider follow up with your Primary Care Provider.  If you are age 46 or younger, your body mass index should be between 19-25. Your Body mass index is 17.08 kg/m. If this is out of the aformentioned range listed, please consider follow up with your Primary Care Provider.   ________________________________________________________  The [facility] providers would like to encourage you to use [facility] to communicate with providers for non-urgent requests or questions.  Due to long hold times on the telephone, sending your provider a message by [facility] may be a faster and more efficient way to get a response.  Please allow 48 business hours for a response.  Please remember that this is for non-urgent requests.  _______________________________________________________  Due to recent changes in healthcare laws, you may see the results of your imaging and laboratory studies on MyChart before your provider has had a chance to review them.  We understand that in some cases  there may be results that are confusing or concerning to you. Not all laboratory results come back in the same time frame and the provider may be waiting for multiple results in order to interpret others.  Please give [place] 48 hours  in order for your provider to thoroughly review all the results before contacting the office for clarification of your results.

## 2020-11-04 ENCOUNTER — Other Ambulatory Visit: Payer: Self-pay | Admitting: Internal Medicine

## 2020-11-09 ENCOUNTER — Ambulatory Visit (HOSPITAL_COMMUNITY)
Admission: RE | Admit: 2020-11-09 | Discharge: 2020-11-09 | Disposition: A | Discharge status: Home / Self Care | Payer: Medicare Other | Source: Ambulatory Visit | Attending: Internal Medicine | Admitting: Internal Medicine

## 2020-11-09 ENCOUNTER — Telehealth: Payer: Self-pay

## 2020-11-09 ENCOUNTER — Encounter (HOSPITAL_COMMUNITY): Payer: Self-pay

## 2020-11-09 DIAGNOSIS — R197 Diarrhea, unspecified: Secondary | ICD-10-CM | POA: Diagnosis not present

## 2020-11-09 DIAGNOSIS — R10814 Left lower quadrant abdominal tenderness: Secondary | ICD-10-CM | POA: Diagnosis not present

## 2020-11-09 DIAGNOSIS — R109 Unspecified abdominal pain: Secondary | ICD-10-CM | POA: Diagnosis not present

## 2020-11-09 MED ORDER — IOHEXOL 350 MG/ML SOLN
80.0000 mL | Freq: Once | INTRAVENOUS | Status: AC | PRN
  Administered 2020-11-09: 80 mL via INTRAVENOUS

## 2020-11-09 NOTE — Telephone Encounter (Signed)
Received call report on CT scan.  IMPRESSION: 1. Evidence of pelvic floor dysfunction with small cystocele, small enterocele and evidence of previous vaginal-pexy from the hysterectomy margin to the midline anterior upper sacral promontory. New findings concerning for rectovaginal fistula, with new prominent feculent material and enteric contrast distending the vagina. Surgical consultation suggested. 2. Small amount of layering nonspecific hyperdense material within the cystocele. Tiny focus of gas in the nondependent bladder. Findings could be secondary to inadvertently introduced material in the bladder lumen from reported self-catheterization, with a rectovesical fistula not excluded. 3. Marked sigmoid diverticulosis, with no evidence of acute diverticulitis. No evidence of bowel obstruction or acute bowel inflammation. 4. Small hiatal hernia. 5. A few small cystic pancreatic lesions scattered throughout the pancreas, largest 1.3 cm in the anterior pancreatic body , difficult to compare to the prior unenhanced CT abdomen study. No overtly suspicious features. No biliary or pancreatic duct dilation. MRI abdomen without and with IV contrast may be obtained if clinically warranted given patient comorbidities. 6. Aortic Atherosclerosis (ICD10-I70.0).

## 2020-11-10 ENCOUNTER — Other Ambulatory Visit: Payer: Self-pay

## 2020-11-10 NOTE — Telephone Encounter (Signed)
I have spoken to the patient's daughter by phone Plan in place See messages attached to radiology result for details

## 2020-11-12 NOTE — Telephone Encounter (Signed)
Patient called in to follow up on my chart message sent.

## 2020-11-13 ENCOUNTER — Other Ambulatory Visit: Payer: Self-pay | Admitting: Internal Medicine

## 2020-11-13 ENCOUNTER — Telehealth: Payer: Self-pay | Admitting: Internal Medicine

## 2020-11-13 ENCOUNTER — Other Ambulatory Visit: Payer: Self-pay

## 2020-11-13 ENCOUNTER — Other Ambulatory Visit (INDEPENDENT_AMBULATORY_CARE_PROVIDER_SITE_OTHER): Payer: Medicare Other

## 2020-11-13 DIAGNOSIS — B9620 Unspecified Escherichia coli [E. coli] as the cause of diseases classified elsewhere: Secondary | ICD-10-CM | POA: Diagnosis not present

## 2020-11-13 DIAGNOSIS — N390 Urinary tract infection, site not specified: Secondary | ICD-10-CM

## 2020-11-13 DIAGNOSIS — R10814 Left lower quadrant abdominal tenderness: Secondary | ICD-10-CM

## 2020-11-13 DIAGNOSIS — R197 Diarrhea, unspecified: Secondary | ICD-10-CM

## 2020-11-13 LAB — URINALYSIS, ROUTINE W REFLEX MICROSCOPIC
Bilirubin Urine: NEGATIVE
Hgb urine dipstick: NEGATIVE
Ketones, ur: NEGATIVE
Leukocytes,Ua: NEGATIVE
Nitrite: NEGATIVE
RBC / HPF: NONE SEEN (ref 0–?)
Specific Gravity, Urine: 1.01 (ref 1.000–1.030)
Total Protein, Urine: NEGATIVE
Urine Glucose: NEGATIVE
Urobilinogen, UA: 0.2 (ref 0.0–1.0)
WBC, UA: NONE SEEN (ref 0–?)
pH: 8 (ref 5.0–8.0)

## 2020-11-13 NOTE — Telephone Encounter (Signed)
Patient's daughter [name] states patient may possibly have a uti.   Daughter states the patient is showing signs of all symptoms and the patient has been hallucinating.   She is requesting a lab order for a urinalysis.

## 2020-11-13 NOTE — Telephone Encounter (Signed)
Patient's daughter [name] states patient is prescribe mirtazapine (REMERON) 15 MG tablet  Patient's daughter is requesting a call back to discuss possible side effects of medication

## 2020-11-13 NOTE — Telephone Encounter (Signed)
Please let patient's daughter know that I am not sure that the findings on recent CT can explain what is happening with the patient at night, specifically vivid dreams and disorientation. Urinary tract infection if present could cause changes in mental status particularly in elderly patients with underlying early dementia or memory impairment  Reasonable to check some lab work including urinalysis with culture; given her history of bacteria in urine we would wait on culture data before acting Could also check CBC Thank you

## 2020-11-16 ENCOUNTER — Other Ambulatory Visit: Payer: Self-pay | Admitting: Internal Medicine

## 2020-11-16 DIAGNOSIS — B952 Enterococcus as the cause of diseases classified elsewhere: Secondary | ICD-10-CM | POA: Insufficient documentation

## 2020-11-16 DIAGNOSIS — N390 Urinary tract infection, site not specified: Secondary | ICD-10-CM | POA: Insufficient documentation

## 2020-11-16 LAB — CULTURE, URINE COMPREHENSIVE

## 2020-11-16 MED ORDER — AMPICILLIN 500 MG PO CAPS: 500.0000 mg | ORAL_CAPSULE | Freq: Three times a day (TID) | ORAL | 0 refills | Status: AC

## 2020-11-17 ENCOUNTER — Other Ambulatory Visit: Payer: Self-pay | Admitting: Internal Medicine

## 2020-11-17 DIAGNOSIS — R64 Cachexia: Secondary | ICD-10-CM

## 2020-11-17 MED ORDER — MIRTAZAPINE 7.5 MG PO TABS: 7.5000 mg | ORAL_TABLET | Freq: Every day | ORAL | 0 refills | Status: DC

## 2020-11-17 NOTE — Telephone Encounter (Signed)
Pt's daughter, Julie Silva, has been informed of Rx sent.  She would like to know can they discontinue the mirtazapine since it doesn't seem to help with vivid dreams. Please advise.

## 2020-11-17 NOTE — Telephone Encounter (Signed)
Julie Silva has been informed that pt should taper off mirtazapine. PCP has sent in a lower dose.

## 2020-11-19 ENCOUNTER — Other Ambulatory Visit (INDEPENDENT_AMBULATORY_CARE_PROVIDER_SITE_OTHER): Payer: Medicare Other

## 2020-11-19 DIAGNOSIS — R197 Diarrhea, unspecified: Secondary | ICD-10-CM | POA: Diagnosis not present

## 2020-11-19 DIAGNOSIS — R10814 Left lower quadrant abdominal tenderness: Secondary | ICD-10-CM

## 2020-11-19 LAB — CBC WITH DIFFERENTIAL/PLATELET
Basophils Absolute: 0 10*3/uL (ref 0.0–0.1)
Basophils Relative: 0.6 % (ref 0.0–3.0)
Eosinophils Absolute: 0.1 10*3/uL (ref 0.0–0.7)
Eosinophils Relative: 2.6 % (ref 0.0–5.0)
HCT: 33.8 % — ABNORMAL LOW (ref 36.0–46.0)
Hemoglobin: 10.9 g/dL — ABNORMAL LOW (ref 12.0–15.0)
Lymphocytes Relative: 42 % (ref 12.0–46.0)
Lymphs Abs: 1.9 10*3/uL (ref 0.7–4.0)
MCHC: 32.3 g/dL (ref 30.0–36.0)
MCV: 92.3 fl (ref 78.0–100.0)
Monocytes Absolute: 0.6 10*3/uL (ref 0.1–1.0)
Monocytes Relative: 13.9 % — ABNORMAL HIGH (ref 3.0–12.0)
Neutro Abs: 1.9 10*3/uL (ref 1.4–7.7)
Neutrophils Relative %: 40.9 % — ABNORMAL LOW (ref 43.0–77.0)
Platelets: 303 10*3/uL (ref 150.0–400.0)
RBC: 3.66 Mil/uL — ABNORMAL LOW (ref 3.87–5.11)
RDW: 14.4 % (ref 11.5–15.5)
WBC: 4.6 10*3/uL (ref 4.0–10.5)

## 2020-11-20 ENCOUNTER — Other Ambulatory Visit: Payer: Self-pay

## 2020-11-20 DIAGNOSIS — D649 Anemia, unspecified: Secondary | ICD-10-CM

## 2020-12-04 ENCOUNTER — Other Ambulatory Visit: Payer: Self-pay

## 2020-12-07 ENCOUNTER — Telehealth: Payer: Self-pay

## 2020-12-07 NOTE — Telephone Encounter (Signed)
-----   Message from Algernon Huxley, RN sent at 11/20/2020  3:48 PM EST ----- Regarding: cbc Pt needs CBC order in epic.l

## 2020-12-07 NOTE — Telephone Encounter (Signed)
Spoke with pts daughter and she is aware, order in epic.

## 2020-12-15 ENCOUNTER — Other Ambulatory Visit: Payer: Medicare Other

## 2020-12-15 ENCOUNTER — Other Ambulatory Visit (INDEPENDENT_AMBULATORY_CARE_PROVIDER_SITE_OTHER): Payer: Medicare Other

## 2020-12-15 DIAGNOSIS — R10814 Left lower quadrant abdominal tenderness: Secondary | ICD-10-CM | POA: Diagnosis not present

## 2020-12-15 DIAGNOSIS — D649 Anemia, unspecified: Secondary | ICD-10-CM

## 2020-12-15 DIAGNOSIS — R197 Diarrhea, unspecified: Secondary | ICD-10-CM

## 2020-12-15 LAB — CBC WITH DIFFERENTIAL/PLATELET
Basophils Absolute: 0 10*3/uL (ref 0.0–0.1)
Basophils Relative: 0.3 % (ref 0.0–3.0)
Eosinophils Absolute: 0.1 10*3/uL (ref 0.0–0.7)
Eosinophils Relative: 0.8 % (ref 0.0–5.0)
HCT: 35.1 % — ABNORMAL LOW (ref 36.0–46.0)
Hemoglobin: 11.2 g/dL — ABNORMAL LOW (ref 12.0–15.0)
Lymphocytes Relative: 27.4 % (ref 12.0–46.0)
Lymphs Abs: 2 10*3/uL (ref 0.7–4.0)
MCHC: 32 g/dL (ref 30.0–36.0)
MCV: 93.5 fl (ref 78.0–100.0)
Monocytes Absolute: 0.6 10*3/uL (ref 0.1–1.0)
Monocytes Relative: 7.9 % (ref 3.0–12.0)
Neutro Abs: 4.7 10*3/uL (ref 1.4–7.7)
Neutrophils Relative %: 63.6 % (ref 43.0–77.0)
Platelets: 262 10*3/uL (ref 150.0–400.0)
RBC: 3.75 Mil/uL — ABNORMAL LOW (ref 3.87–5.11)
RDW: 14.4 % (ref 11.5–15.5)
WBC: 7.5 10*3/uL (ref 4.0–10.5)

## 2020-12-15 LAB — URINALYSIS WITH CULTURE, IF INDICATED
Bilirubin Urine: NEGATIVE
Ketones, ur: NEGATIVE
Nitrite: NEGATIVE
Specific Gravity, Urine: 1.015 (ref 1.000–1.030)
Urine Glucose: NEGATIVE
Urobilinogen, UA: 0.2 (ref 0.0–1.0)
pH: 7 (ref 5.0–8.0)

## 2020-12-16 ENCOUNTER — Ambulatory Visit: Payer: Medicare Other

## 2020-12-17 ENCOUNTER — Other Ambulatory Visit: Payer: Self-pay | Admitting: Internal Medicine

## 2020-12-17 DIAGNOSIS — N824 Other female intestinal-genital tract fistulae: Secondary | ICD-10-CM | POA: Diagnosis not present

## 2020-12-17 LAB — URINE CULTURE
MICRO NUMBER:: 12750552
SPECIMEN QUALITY:: ADEQUATE

## 2020-12-19 ENCOUNTER — Other Ambulatory Visit: Payer: Self-pay | Admitting: Internal Medicine

## 2020-12-19 DIAGNOSIS — N3000 Acute cystitis without hematuria: Secondary | ICD-10-CM | POA: Insufficient documentation

## 2020-12-19 MED ORDER — LEVOFLOXACIN 250 MG PO TABS
250.0000 mg | ORAL_TABLET | Freq: Every day | ORAL | 0 refills | Status: AC
Start: 2020-12-19 — End: 2020-12-24

## 2020-12-23 ENCOUNTER — Encounter: Payer: Self-pay | Admitting: *Deleted

## 2021-01-12 ENCOUNTER — Encounter: Payer: Self-pay | Admitting: Internal Medicine

## 2021-01-13 NOTE — Telephone Encounter (Signed)
Please let patient's daughter know that her recurrent UTIs are likely related to her rectovaginal fistula Okay to perform urinalysis with culture Also okay to perform a GI pathogen panel We would have to be careful with antidiarrheals but she could try limited Lomotil 1 tablet 3 times daily as needed assuming there is no infectious diarrhea

## 2021-01-14 ENCOUNTER — Other Ambulatory Visit (INDEPENDENT_AMBULATORY_CARE_PROVIDER_SITE_OTHER): Payer: Medicare Other

## 2021-01-14 ENCOUNTER — Other Ambulatory Visit: Payer: Self-pay

## 2021-01-14 DIAGNOSIS — R10814 Left lower quadrant abdominal tenderness: Secondary | ICD-10-CM | POA: Diagnosis not present

## 2021-01-14 DIAGNOSIS — R197 Diarrhea, unspecified: Secondary | ICD-10-CM

## 2021-01-15 DIAGNOSIS — R197 Diarrhea, unspecified: Secondary | ICD-10-CM | POA: Diagnosis not present

## 2021-01-15 DIAGNOSIS — R10814 Left lower quadrant abdominal tenderness: Secondary | ICD-10-CM | POA: Diagnosis not present

## 2021-01-15 LAB — URINALYSIS WITH CULTURE, IF INDICATED
Bilirubin Urine: NEGATIVE
Ketones, ur: NEGATIVE
Nitrite: NEGATIVE
Specific Gravity, Urine: 1.01 (ref 1.000–1.030)
Urine Glucose: NEGATIVE
Urobilinogen, UA: 0.2 (ref 0.0–1.0)
pH: 7 (ref 5.0–8.0)

## 2021-01-17 LAB — URINE CULTURE
MICRO NUMBER:: 12868517
SPECIMEN QUALITY:: ADEQUATE

## 2021-01-18 LAB — GI PROFILE, STOOL, PCR

## 2021-01-19 ENCOUNTER — Other Ambulatory Visit: Payer: Self-pay

## 2021-01-19 MED ORDER — CIPROFLOXACIN HCL 500 MG PO TABS: 500.0000 mg | ORAL_TABLET | Freq: Two times a day (BID) | ORAL | 0 refills | Status: DC

## 2021-02-01 ENCOUNTER — Ambulatory Visit: Payer: Medicare Other | Admitting: Internal Medicine

## 2021-02-02 ENCOUNTER — Other Ambulatory Visit: Payer: Self-pay | Admitting: Internal Medicine

## 2021-02-02 DIAGNOSIS — Z23 Encounter for immunization: Secondary | ICD-10-CM | POA: Diagnosis not present

## 2021-02-02 DIAGNOSIS — K2100 Gastro-esophageal reflux disease with esophagitis, without bleeding: Secondary | ICD-10-CM

## 2021-02-04 ENCOUNTER — Other Ambulatory Visit: Payer: Self-pay | Admitting: Internal Medicine

## 2021-02-05 DIAGNOSIS — I480 Paroxysmal atrial fibrillation: Secondary | ICD-10-CM | POA: Diagnosis not present

## 2021-02-05 DIAGNOSIS — R350 Frequency of micturition: Secondary | ICD-10-CM | POA: Diagnosis not present

## 2021-02-07 ENCOUNTER — Other Ambulatory Visit: Payer: Self-pay | Admitting: Internal Medicine

## 2021-02-07 DIAGNOSIS — R64 Cachexia: Secondary | ICD-10-CM

## 2021-02-08 ENCOUNTER — Other Ambulatory Visit: Payer: Self-pay | Admitting: Internal Medicine

## 2021-02-08 ENCOUNTER — Encounter: Payer: Self-pay | Admitting: Internal Medicine

## 2021-02-09 ENCOUNTER — Other Ambulatory Visit: Payer: Self-pay | Admitting: Internal Medicine

## 2021-02-09 NOTE — Telephone Encounter (Signed)
FYI. Please advise Thank you

## 2021-02-09 NOTE — Telephone Encounter (Signed)
Please advise. Thank you

## 2021-02-10 ENCOUNTER — Encounter: Payer: Self-pay | Admitting: Internal Medicine

## 2021-02-10 NOTE — Telephone Encounter (Signed)
Called patient and informed caregiver [name] your recommendation. She stated if a direct admit can be placed for the patient so they don't have to sit in the ED long due to flu/ covid. Please advise your recommendation. Thank you

## 2021-02-10 NOTE — Telephone Encounter (Signed)
Patient stated she could have a possible UTI but patient is concern about coming in the office due to [place]. Please advise. Pt wanting antibiotics

## 2021-02-11 DIAGNOSIS — R531 Weakness: Secondary | ICD-10-CM | POA: Diagnosis not present

## 2021-02-11 DIAGNOSIS — K529 Noninfective gastroenteritis and colitis, unspecified: Secondary | ICD-10-CM | POA: Diagnosis present

## 2021-02-11 DIAGNOSIS — R339 Retention of urine, unspecified: Secondary | ICD-10-CM | POA: Diagnosis present

## 2021-02-11 DIAGNOSIS — N321 Vesicointestinal fistula: Secondary | ICD-10-CM | POA: Diagnosis present

## 2021-02-11 DIAGNOSIS — Z79899 Other long term (current) drug therapy: Secondary | ICD-10-CM | POA: Diagnosis not present

## 2021-02-11 DIAGNOSIS — R197 Diarrhea, unspecified: Secondary | ICD-10-CM | POA: Diagnosis not present

## 2021-02-11 DIAGNOSIS — I10 Essential (primary) hypertension: Secondary | ICD-10-CM | POA: Diagnosis present

## 2021-02-11 DIAGNOSIS — R3915 Urgency of urination: Secondary | ICD-10-CM | POA: Diagnosis not present

## 2021-02-11 DIAGNOSIS — N3001 Acute cystitis with hematuria: Secondary | ICD-10-CM | POA: Diagnosis not present

## 2021-02-11 DIAGNOSIS — Z950 Presence of cardiac pacemaker: Secondary | ICD-10-CM | POA: Diagnosis not present

## 2021-02-11 DIAGNOSIS — E46 Unspecified protein-calorie malnutrition: Secondary | ICD-10-CM | POA: Diagnosis not present

## 2021-02-11 DIAGNOSIS — I4891 Unspecified atrial fibrillation: Secondary | ICD-10-CM | POA: Diagnosis not present

## 2021-02-11 DIAGNOSIS — Z7901 Long term (current) use of anticoagulants: Secondary | ICD-10-CM | POA: Diagnosis not present

## 2021-02-11 DIAGNOSIS — Z20822 Contact with and (suspected) exposure to covid-19: Secondary | ICD-10-CM | POA: Diagnosis present

## 2021-02-11 DIAGNOSIS — R319 Hematuria, unspecified: Secondary | ICD-10-CM | POA: Diagnosis present

## 2021-02-11 DIAGNOSIS — I480 Paroxysmal atrial fibrillation: Secondary | ICD-10-CM | POA: Diagnosis present

## 2021-02-11 DIAGNOSIS — E785 Hyperlipidemia, unspecified: Secondary | ICD-10-CM | POA: Diagnosis present

## 2021-02-11 DIAGNOSIS — R630 Anorexia: Secondary | ICD-10-CM | POA: Diagnosis not present

## 2021-02-11 DIAGNOSIS — Z681 Body mass index (BMI) 19 or less, adult: Secondary | ICD-10-CM | POA: Diagnosis not present

## 2021-02-11 DIAGNOSIS — B9689 Other specified bacterial agents as the cause of diseases classified elsewhere: Secondary | ICD-10-CM | POA: Diagnosis present

## 2021-02-11 DIAGNOSIS — Z1624 Resistance to multiple antibiotics: Secondary | ICD-10-CM | POA: Diagnosis present

## 2021-02-11 DIAGNOSIS — E43 Unspecified severe protein-calorie malnutrition: Secondary | ICD-10-CM | POA: Diagnosis present

## 2021-02-11 DIAGNOSIS — H409 Unspecified glaucoma: Secondary | ICD-10-CM | POA: Diagnosis present

## 2021-02-11 DIAGNOSIS — K219 Gastro-esophageal reflux disease without esophagitis: Secondary | ICD-10-CM | POA: Diagnosis present

## 2021-02-11 DIAGNOSIS — N390 Urinary tract infection, site not specified: Secondary | ICD-10-CM | POA: Diagnosis present

## 2021-02-11 DIAGNOSIS — H3530 Unspecified macular degeneration: Secondary | ICD-10-CM | POA: Diagnosis present

## 2021-02-11 DIAGNOSIS — R350 Frequency of micturition: Secondary | ICD-10-CM | POA: Diagnosis not present

## 2021-02-12 DIAGNOSIS — R197 Diarrhea, unspecified: Secondary | ICD-10-CM | POA: Diagnosis not present

## 2021-02-12 DIAGNOSIS — B9689 Other specified bacterial agents as the cause of diseases classified elsewhere: Secondary | ICD-10-CM | POA: Diagnosis present

## 2021-02-12 DIAGNOSIS — R319 Hematuria, unspecified: Secondary | ICD-10-CM | POA: Diagnosis present

## 2021-02-12 DIAGNOSIS — I4891 Unspecified atrial fibrillation: Secondary | ICD-10-CM | POA: Diagnosis not present

## 2021-02-12 DIAGNOSIS — E43 Unspecified severe protein-calorie malnutrition: Secondary | ICD-10-CM | POA: Diagnosis present

## 2021-02-12 DIAGNOSIS — N321 Vesicointestinal fistula: Secondary | ICD-10-CM | POA: Diagnosis present

## 2021-02-12 DIAGNOSIS — Z1624 Resistance to multiple antibiotics: Secondary | ICD-10-CM | POA: Diagnosis present

## 2021-02-12 DIAGNOSIS — N3001 Acute cystitis with hematuria: Secondary | ICD-10-CM | POA: Diagnosis not present

## 2021-02-12 DIAGNOSIS — K529 Noninfective gastroenteritis and colitis, unspecified: Secondary | ICD-10-CM | POA: Diagnosis present

## 2021-02-12 DIAGNOSIS — N390 Urinary tract infection, site not specified: Secondary | ICD-10-CM | POA: Diagnosis not present

## 2021-02-12 DIAGNOSIS — Z7901 Long term (current) use of anticoagulants: Secondary | ICD-10-CM | POA: Diagnosis not present

## 2021-02-12 DIAGNOSIS — K219 Gastro-esophageal reflux disease without esophagitis: Secondary | ICD-10-CM | POA: Diagnosis present

## 2021-02-12 DIAGNOSIS — Z20822 Contact with and (suspected) exposure to covid-19: Secondary | ICD-10-CM | POA: Diagnosis present

## 2021-02-12 DIAGNOSIS — H3530 Unspecified macular degeneration: Secondary | ICD-10-CM | POA: Diagnosis present

## 2021-02-12 DIAGNOSIS — Z79899 Other long term (current) drug therapy: Secondary | ICD-10-CM | POA: Diagnosis not present

## 2021-02-12 DIAGNOSIS — I10 Essential (primary) hypertension: Secondary | ICD-10-CM | POA: Diagnosis present

## 2021-02-12 DIAGNOSIS — H409 Unspecified glaucoma: Secondary | ICD-10-CM | POA: Diagnosis present

## 2021-02-12 DIAGNOSIS — Z950 Presence of cardiac pacemaker: Secondary | ICD-10-CM | POA: Diagnosis not present

## 2021-02-12 DIAGNOSIS — E46 Unspecified protein-calorie malnutrition: Secondary | ICD-10-CM | POA: Diagnosis not present

## 2021-02-12 DIAGNOSIS — E785 Hyperlipidemia, unspecified: Secondary | ICD-10-CM | POA: Diagnosis present

## 2021-02-12 DIAGNOSIS — I480 Paroxysmal atrial fibrillation: Secondary | ICD-10-CM | POA: Diagnosis present

## 2021-02-12 DIAGNOSIS — Z681 Body mass index (BMI) 19 or less, adult: Secondary | ICD-10-CM | POA: Diagnosis not present

## 2021-02-12 DIAGNOSIS — R339 Retention of urine, unspecified: Secondary | ICD-10-CM | POA: Diagnosis present

## 2021-02-25 ENCOUNTER — Encounter: Payer: Self-pay | Admitting: Internal Medicine

## 2021-02-25 NOTE — Progress Notes (Signed)
Subjective:    Patient ID: Julie Silva, female    DOB: 1927-04-29, 88 y.o.   MRN: [id number]  This visit occurred during the SARS-CoV-2 public health emergency.  Safety protocols were in place, including screening questions prior to the visit, additional usage of staff PPE, and extensive cleaning of exam room while observing appropriate contact time as indicated for disinfecting solutions.    HPI The patient is here for an acute visit.  2/3  - had UTI symptoms - positive UA- no cx ordered.  Advised to go to ED - previous cx resistent.  Went to ED - cx done and admittted - started on IV - piperacillin.  Cefdinir at d/c on 2/11 -- took x 6 days.  She did much better after that medication and was feeling back to her usual self.   2/22 - had UTI symtpoms - not being able to empty bladder.   2/23 - some urgency and incontinence.  Not able to empty bladder -- those symptoms have gotten worse.  Last night she had some lower abdominal discomfort.  She has had some nausea and did vomit a little last night.  She typically does not in and out cath every night.  She did have to do an extra cath yesterday evening because she felt like she was retaining, which is typical of her UTIs.  She has a colovesicular fistula that was repaired years ago with mesh, but this repair is not to be not effective any longer and she has been getting recurrent urinary tract infections.   Has app with ID on 3/8.    Medications and allergies reviewed with patient and updated if appropriate.  Patient Active Problem List   Diagnosis Date Noted   Acute cystitis without hematuria 12/19/2020   Enterococcus UTI 11/16/2020   Right hip pain 08/24/2020   Atherosclerosis of aorta ([place]) 07/31/2020   Cachexia ([place]) 07/30/2020   Vaginal discharge 07/30/2020   Pseudogout involving multiple joints 12/11/2018   E. coli UTI (urinary tract infection) 11/09/2017   Abnormal CT scan, chest 10/06/2015   Intraductal papillary  adenocarcinoma of female breast 04/29/2015   Abnormal chest x-ray with multiple lung nodules 56/21/3086   Diastolic dysfunction 57/84/6962   Second degree AV block 03/25/2014   Gastroesophageal reflux disease with esophagitis 07/31/2013   Dysphagia 07/31/2013   Essential hypertension, benign 05/07/2013   Hyperlipidemia with target LDL less than 130 05/07/2013   Routine general medical examination at a health care facility 05/07/2013   SVT (supraventricular tachycardia) ([place]) 04/16/2013   Urinary retention 02/03/2013   Osteopenia 02/03/2013    Current Outpatient Medications on File Prior to Visit  Medication Sig Dispense Refill   bimatoprost (LUMIGAN) 0.01 % SOLN Place 1 drop into both eyes at bedtime.     Catheters MISC Replace catheter twice daily. 180 each 1   ciprofloxacin (CIPRO) 500 MG tablet Take 1 tablet (500 mg total) by mouth 2 (two) times daily. 10 tablet 0   Colchicine (MITIGARE) 0.6 MG CAPS Take 1 tablet by mouth every other day. 45 capsule 1   dorzolamide-timolol (COSOPT) 22.3-6.8 MG/ML ophthalmic solution Place 1 drop into both eyes 2 (two) times daily.      ELIQUIS 2.5 MG TABS tablet TAKE 1 TABLET TWICE A DAY 180 tablet 0   estradiol (ESTRACE) 0.1 MG/GM vaginal cream Place 1 Applicatorful vaginally at bedtime.     methocarbamol (ROBAXIN) 500 MG tablet Take 1 tablet (500 mg total) by mouth 2 (two) times  daily as needed for muscle spasms. 30 tablet 0   mirtazapine (REMERON) 7.5 MG tablet TAKE 1 TABLET BY MOUTH AT BEDTIME. 90 tablet 0   omeprazole (PRILOSEC) 20 MG capsule TAKE 1 CAPSULE BY MOUTH EVERY DAY 90 capsule 1   promethazine (PHENERGAN) 12.5 MG tablet TAKE 1 TABLET (12.5 MG TOTAL) BY MOUTH EVERY 6 (SIX) HOURS AS NEEDED FOR NAUSEA OR VOMITING. 30 tablet 0   saccharomyces boulardii (FLORASTOR) 250 MG capsule Take 250 mg by mouth daily.     sulfamethoxazole-trimethoprim (BACTRIM) 200-40 MG/5ML suspension TAKE 5 MLS BY MOUTH 3 (THREE) TIMES A WEEK. 60 mL 3   traMADol  (ULTRAM) 50 MG tablet Take 1 tablet (50 mg total) by mouth every 6 (six) hours as needed. 35 tablet 2   Wheat Dextrin (BENEFIBER PO) Take 1 mg by mouth daily.     cefdinir (OMNICEF) 300 MG capsule Take 300 mg by mouth daily.     No current facility-administered medications on file prior to visit.    Past Medical History:  Diagnosis Date   Anemia    Aortic atherosclerosis (HCC)    Arthritis    Osteoporosis, neck stiffness   Atrial fibrillation ([place])    Bleeds easily [facility])    "father was a hemophiliac" - pt not being bothered in recent years.   CAD (coronary artery disease)    Calcium deposit in bursa of knee 2017   Cancer ([place])    skin cancer "basal cell".   Chicken pox    Cyst, Baker's knee    left knee- tx. Cortisone injection 05-05-15 in office- "improved pain relief and swelling left ankle and knee"   Diverticulitis    Dysrhythmia    Dr. McAlhany-cardiology   GERD (gastroesophageal reflux disease)    Glaucoma of both eyes    Gout    Hearing loss of both ears    Bilateral ears- hearing aids used- right ear hearing betther than left.   Hiatal hernia    History of blood transfusion 1957   "lots; most related to my periods"-last 1968 -s/p Hysterectomy   Migraines    "years ago" (02/04/2013)   Motion sickness    back seat - cars, sudden movements   PAF (paroxysmal atrial fibrillation) (HCC)    Pancreatic cyst    PONV (postoperative nausea and vomiting)    Risk for falls    "wobbley" per daughter   Self-catheterizes urinary bladder    "daily- retained urine and chronic UTI"   SVT (supraventricular tachycardia) ([place])    Dr. Darnell Level. Taylor-follows "loop recorder left chest"   Swallowing difficulty    freq occ. mostley liquids   Urine incontinence    UTI (urinary tract infection)    Chronic Urinary tract infections- tx Macrobid daily.   Vertigo    Wears hearing aid in both ears     Past Surgical History:  Procedure Laterality Date   APPENDECTOMY  01/03/1946   BLADDER  SURGERY  01/03/1993   Repair-    BREAST BIOPSY Bilateral    "both were fine"   CATARACT EXTRACTION W/ INTRAOCULAR LENS IMPLANT Left    CATARACT EXTRACTION W/PHACO Right 10/16/2018   Procedure: CATARACT EXTRACTION PHACO AND INTRAOCULAR LENS PLACEMENT ([place])  RIGHT MiLoop diabetes 01:05.8  21.0%  13.77;  Surgeon: Birder Robson, MD;  Location: [facility];  Service: Ophthalmology;  Laterality: Right;  BS tends to drop in AM and would like early surgery so she can get home to eat, please   CHOLECYSTECTOMY  01/04/2012   COMBINED HYSTERECTOMY ABDOMINAL W/ A&P REPAIR / OOPHORECTOMY     DILATION AND CURETTAGE OF UTERUS     EP IMPLANTABLE DEVICE N/A 06/19/2014   Procedure: Loop Recorder Insertion;  Surgeon: Evans Lance, MD;  Location: [facility];  Service: Cardiovascular;  Laterality: N/A;   EXCISION OF BREAST BIOPSY Right 05/13/2015   Procedure: RIGHT BREAST EXCISIONAL BIOPSY x2;  Surgeon: Armandina Gemma, MD;  Location: [facility];  Service: General;  Laterality: Right;   PACEMAKER IMPLANT  02/2020   TONSILLECTOMY  01/04/1944   TOTAL ABDOMINAL HYSTERECTOMY     VAGINAL HYSTERECTOMY  01/03/1966   vaginal mesh      Social History   Socioeconomic History   Marital status: Widowed    Spouse name: Not on file   Number of children: 4   Years of education: 12   Highest education level: Not on file  Occupational History   Occupation: Runner, broadcasting/film/video  Tobacco Use   Smoking status: Never   Smokeless tobacco: Never  Vaping Use   Vaping Use: Never used  Substance and Sexual Activity   Alcohol use: No    Alcohol/week: 0.0 standard drinks   Drug use: No   Sexual activity: Never  Other Topics Concern   Not on file  Social History Narrative   Lives alone in a one story home.  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    Social Determinants of Health   Financial Resource Strain: Not on file  Food Insecurity: Not on file  Transportation Needs: Not on file  Physical  Activity: Not on file  Stress: Not on file  Social Connections: Not on file    Family History  Problem Relation Age of Onset   Stroke Father    CVA Father    Ovarian cancer Mother    Cancer - Other Sister        Breast   Breast cancer Sister    Cancer - Other Brother        Throat   Cancer - Other Sister        Throat   Brain cancer Sister    Cancer - Other Other        Brain   Breast cancer Other     Review of Systems  Constitutional:  Negative for fever.  Gastrointestinal:  Positive for abdominal pain, nausea and vomiting (Small amount last night).  Genitourinary:  Positive for difficulty urinating. Negative for flank pain and hematuria.  Musculoskeletal:  Negative for back pain.      Objective:   Vitals:   02/26/21 1002  BP: 112/76  Pulse: 77  Temp: 97.9 F (36.6 C)  SpO2: 98%   BP Readings from Last 3 Encounters:  02/26/21 112/76  10/29/20 140/62  09/30/20 128/66   Wt Readings from Last 3 Encounters:  02/26/21 101 lb 6 oz (46 kg)  10/29/20 99 lb 8 oz (45.1 kg)  09/30/20 99 lb 3.2 oz (45 kg)   Body mass index is 17.4 kg/m.   Physical Exam Constitutional:      General: She is not in acute distress.    Appearance: Normal appearance. She is not ill-appearing.  HENT:     Head: Normocephalic.  Eyes:     Conjunctiva/sclera: Conjunctivae normal.  Abdominal:     General: There is no distension.     Palpations: Abdomen is soft.     Tenderness: There is no abdominal tenderness. There is no right CVA tenderness, left CVA  tenderness, guarding or rebound.  Skin:    General: Skin is warm and dry.  Neurological:     Mental Status: She is alert.       Reviewed [facility] hospitalization and blood work/urine tests   Assessment & Plan:    Acute cystitis: Acute Has recurrent urinary tract infections-had infection in December, January and earlier this month-all 3 cultures grew out Providencia rettgeri that is unfortunately becoming more resistant to different  antibiotics Recently hospitalized at [facility]-did well with cefdinir, but symptoms recurred most likely because of colovesicular fistula Urine dip here suggestive of UTI-sent for culture Start cefdinir 300 mg twice daily x7 days To see [facility] infectious disease on 3/8

## 2021-02-26 ENCOUNTER — Ambulatory Visit (INDEPENDENT_AMBULATORY_CARE_PROVIDER_SITE_OTHER): Payer: Medicare Other | Admitting: Internal Medicine

## 2021-02-26 ENCOUNTER — Other Ambulatory Visit: Payer: Self-pay

## 2021-02-26 ENCOUNTER — Encounter: Payer: Self-pay | Admitting: Internal Medicine

## 2021-02-26 VITALS — BP 112/76 | HR 77 | Temp 97.9°F | Ht 64.0 in | Wt 101.4 lb

## 2021-02-26 DIAGNOSIS — R300 Dysuria: Secondary | ICD-10-CM

## 2021-02-26 DIAGNOSIS — N3000 Acute cystitis without hematuria: Secondary | ICD-10-CM

## 2021-02-26 LAB — POCT URINALYSIS DIPSTICK
Bilirubin, UA: NEGATIVE
Glucose, UA: NEGATIVE
Ketones, UA: NEGATIVE
Nitrite, UA: POSITIVE
Protein, UA: POSITIVE — AB
Spec Grav, UA: 1.01 (ref 1.010–1.025)
Urobilinogen, UA: 0.2 E.U./dL
pH, UA: 8.5 — AB (ref 5.0–8.0)

## 2021-02-26 MED ORDER — CEFDINIR 300 MG PO CAPS: 300.0000 mg | ORAL_CAPSULE | Freq: Two times a day (BID) | ORAL | 0 refills | Status: AC

## 2021-02-26 NOTE — Patient Instructions (Addendum)
° ° ° ° ° °  Medications changes include :   cefdinir twice daily x 7 days     Your prescription(s) have been sent to your pharmacy.

## 2021-02-28 LAB — CULTURE, URINE COMPREHENSIVE

## 2021-03-02 DIAGNOSIS — Z681 Body mass index (BMI) 19 or less, adult: Secondary | ICD-10-CM | POA: Diagnosis not present

## 2021-03-02 DIAGNOSIS — N390 Urinary tract infection, site not specified: Secondary | ICD-10-CM | POA: Diagnosis not present

## 2021-03-08 DIAGNOSIS — Z681 Body mass index (BMI) 19 or less, adult: Secondary | ICD-10-CM | POA: Diagnosis not present

## 2021-03-08 DIAGNOSIS — T83711A Erosion of implanted vaginal mesh and other prosthetic materials to surrounding organ or tissue, initial encounter: Secondary | ICD-10-CM | POA: Diagnosis not present

## 2021-03-08 DIAGNOSIS — N390 Urinary tract infection, site not specified: Secondary | ICD-10-CM | POA: Diagnosis not present

## 2021-03-08 DIAGNOSIS — N952 Postmenopausal atrophic vaginitis: Secondary | ICD-10-CM | POA: Diagnosis not present

## 2021-03-12 ENCOUNTER — Encounter: Payer: Self-pay | Admitting: *Deleted

## 2021-03-12 DIAGNOSIS — M3501 Sicca syndrome with keratoconjunctivitis: Secondary | ICD-10-CM | POA: Diagnosis not present

## 2021-03-16 ENCOUNTER — Ambulatory Visit (INDEPENDENT_AMBULATORY_CARE_PROVIDER_SITE_OTHER): Payer: Medicare Other | Admitting: Internal Medicine

## 2021-03-16 ENCOUNTER — Encounter: Payer: Self-pay | Admitting: Internal Medicine

## 2021-03-16 VITALS — BP 140/72 | HR 60 | Ht 64.0 in | Wt 101.0 lb

## 2021-03-16 DIAGNOSIS — R197 Diarrhea, unspecified: Secondary | ICD-10-CM

## 2021-03-16 DIAGNOSIS — N390 Urinary tract infection, site not specified: Secondary | ICD-10-CM | POA: Diagnosis not present

## 2021-03-16 NOTE — Patient Instructions (Signed)
Please purchase the following medications over the counter and take as directed: ?Benefiber  ?Florastor once daily ?Saline Enema-as needed for constipation ? ?If you are age 86 or older, your body mass index should be between 23-30. Your Body mass index is 17.34 kg/m?Marland Kitchen If this is out of the aforementioned range listed, please consider follow up with your Primary Care Provider. ? ?If you are age 9 or younger, your body mass index should be between 19-25. Your Body mass index is 17.34 kg/m?Marland Kitchen If this is out of the aformentioned range listed, please consider follow up with your Primary Care Provider.  ? ?________________________________________________________ ? ?The [facility] providers would like to encourage you to use [facility] to communicate with providers for non-urgent requests or questions.  Due to long hold times on the telephone, sending your provider a message by [facility] may be a faster and more efficient way to get a response.  Please allow 48 business hours for a response.  Please remember that this is for non-urgent requests.  ?_______________________________________________________ ?Due to recent changes in healthcare laws, you may see the results of your imaging and laboratory studies on MyChart before your provider has had a chance to review them.  We understand that in some cases there may be results that are confusing or concerning to you. Not all laboratory results come back in the same time frame and the provider may be waiting for multiple results in order to interpret others.  Please give [place] 48 hours in order for your provider to thoroughly review all the results before contacting the office for clarification of your results.  ? ?

## 2021-03-16 NOTE — Progress Notes (Signed)
? ?Subjective:  ? ? Patient ID: Julie Silva, female    DOB: [birth year], 88 y.o.   MRN: [id number] ? ?HPI ?Julie Silva is a 88 year old female with a history of GERD, diverticulosis, intermittent overflow diarrhea, atrial fibrillation on Eliquis, chronic UTI now on suppressive antibiotics and use of In and Out cath, vaginal atrophy with mesh erosion from prior pelvic surgery who is here for follow-up.  She is here today with her daughter.  I last saw her in October 2022. ? ?At this point she is doing certainly better than she was when I last saw her.  She has been seen by Dr. Dema Severin with [facility] who did not feel that she had a significant colovaginal fistula and therefore did not recommend operative management.  She has been seen at [facility] infectious disease for recurrent UTI as well as [facility] urogynecology.  She is now on chronic suppressive cefdinir. ? ?From a GI perspective she has been managing these episodes of explosive overflow diarrhea.  She is taking fiber daily as well as Florastor 250 mg daily.  Her last episode of explosive diarrhea was on 02/26/2021.  She is using saline enema if she is having multiple days of incomplete bowel movement or no bowel movement.  She did this on 03/10/2021 and had a gradual more complete emptying and has not had recurrent explosive overflow diarrhea.  Suppository was not helpful and so they switched to enema.  She denies abdominal pain though occasionally will have lower abdominal cramping.  Appetite is improved and she is drinking high-calorie boost once or twice daily.  She has been able to gain a few pounds. ? ? ?Review of Systems ?As per HPI, otherwise negative ? ?Current Medications, Allergies, Past Medical History, Past Surgical History, Family History and Social History were reviewed in [employer] record. ?   ?Objective:  ? Physical Exam ?BP 140/72   Pulse 60   Ht '5\' 4"'$  (1.626 m)   Wt 101 lb (45.8 kg)   BMI 17.34 kg/m?  ?Gen: awake, alert, NAD ?HEENT:  anicteric  ?Abd: soft, NT/ND, +BS throughout ?Ext: no c/c/e ?Neuro: nonfocal ? ?CT ABDOMEN AND PELVIS WITH CONTRAST ?  ?TECHNIQUE: ?Multidetector CT imaging of the abdomen and pelvis was performed ?using the standard protocol following bolus administration of ?intravenous contrast. ?  ?CONTRAST:  63m OMNIPAQUE IOHEXOL 350 MG/ML SOLN ?  ?COMPARISON:  CT chest 09/25/2020; x-ray abdomen 02/06/2017; U/S ?renal 04/15/2013; CT abdomen-pelvis/MR abdomen 02/03/2013. ?  ?FINDINGS: ?Lower chest: No significant pulmonary nodules or acute consolidative ?airspace disease. Leadless ICD in the right ventricular apex. ?  ?Hepatobiliary: Normal liver size. Granulomatous lateral segment left ?liver calcification is unchanged. Subcentimeter hypodense left liver ?dome lesion is too small to characterize and unchanged, considered ?benign. No new liver lesions. Cholecystectomy. Bile ducts are stable ?and within normal post cholecystectomy limits with CBD diameter 6 ?mm. Intrahepatic pneumobilia as on the prior scan, presumably due to ?a history of sphincterotomy. ?  ?Pancreas: A few small cystic pancreatic lesions scattered throughout ?the pancreas, largest 1.3 cm in the anterior pancreatic body (series ?5/image 44), difficult to compare to the prior unenhanced CT abdomen ?study. No pancreatic duct dilation. ?  ?Spleen: Normal size spleen. Stable scattered granulomatous splenic ?calcifications. ?  ?Adrenals/Urinary Tract: Normal adrenals. Simple 2.2 cm medial lower ?left renal cyst. Several subcentimeter hypodense renal cortical ?lesions in both kidneys are too small to characterize and require no ?follow-up. No hydronephrosis. Small cystocele. Bladder is not ?significantly distended and contains  a tiny amount of nonspecific ?layering hyperdense material within the cystocele (series 2/image ?73). Tiny focus of gas in the nondependent bladder lumen. ?  ?Stomach/Bowel: Small hiatal hernia. Otherwise normal nondistended ?stomach. Normal  caliber small bowel with no small bowel wall ?thickening. Several small bowel loops are located within a small ?enterocele in the deep pelvis (series 2/image 73). Appendectomy. ?Oral contrast transits to the rectum. Marked sigmoid diverticulosis, ?with no significant large bowel wall thickening or acute pericolonic ?fat stranding. ?  ?Vascular/Lymphatic: Atherosclerotic nonaneurysmal abdominal aorta. ?Patent portal, splenic, hepatic and renal veins. No pathologically ?enlarged lymph nodes in the abdomen or pelvis. ?  ?Reproductive: Status post hysterectomy. There is evidence of ?previous vaginal-pexy from the hysterectomy margin to the midline ?anterior upper sacral promontory. There is new prominent feculent ?material and enteric contrast distending the vagina (series 6/image ?84), likely indicating a rectovaginal fistula. ?  ?Other: No pneumoperitoneum, ascites or focal fluid collection. ?  ?Musculoskeletal: No aggressive appearing focal osseous lesions. ?Moderate lumbar spondylosis. ?  ?IMPRESSION: ?1. Evidence of pelvic floor dysfunction with small cystocele, small ?enterocele and evidence of previous vaginal-pexy from the ?hysterectomy margin to the midline anterior upper sacral promontory. ?New findings concerning for rectovaginal fistula, with new prominent ?feculent material and enteric contrast distending the vagina. ?Surgical consultation suggested. ?2. Small amount of layering nonspecific hyperdense material within ?the cystocele. Tiny focus of gas in the nondependent bladder. ?Findings could be secondary to inadvertently introduced material in ?the bladder lumen from reported self-catheterization, with a ?rectovesical fistula not excluded. ?3. Marked sigmoid diverticulosis, with no evidence of acute ?diverticulitis. No evidence of bowel obstruction or acute bowel ?inflammation. ?4. Small hiatal hernia. ?5. A few small cystic pancreatic lesions scattered throughout the ?pancreas, largest 1.3 cm in the  anterior pancreatic body , difficult ?to compare to the prior unenhanced CT abdomen study. No overtly ?suspicious features. No biliary or pancreatic duct dilation. MRI ?abdomen without and with IV contrast may be obtained if clinically ?warranted given patient comorbidities. ?6. Aortic Atherosclerosis (ICD10-I70.0). ?  ?These results will be called to the ordering clinician or ?representative by the Radiologist Assistant, and communication ?documented in the PACS or [employer]. ?  ?  ?Electronically Signed ?  By: Ilona Sorrel M.D. ?  On: 11/09/2020 15:19 ? ? ? ?   ?Assessment & Plan:  ?88 year old female with a history of GERD, diverticulosis, intermittent overflow diarrhea, atrial fibrillation on Eliquis, chronic UTI now on suppressive antibiotics and use of In and Out cath, vaginal atrophy with mesh erosion from prior pelvic surgery who is here for follow-up.  ? ?Overflow diarrhea --doing well with Benefiber therapy.  CT scan did suggest possible colovaginal fistula but after surgical evaluation this was not felt to be significant.  I certainly agree that I would rather her avoid a bowel operation if possible.  She seems to be doing well with intermittent enema which she can continue if there is bowel irregularity. ?--Continue Benefiber 2 teaspoons once to twice daily ?--Fleets or saline or mineral oil enema as needed ?--Follow-up as needed ? ?2.  Chronic UTI/vaginal atrophy with mesh erosion --on chronic suppressive cefdinir and following with urogynecology at [facility].  Cystoscopy planned. ? ?30 minutes total spent today including patient facing time, coordination of care, reviewing medical history/procedures/pertinent radiology studies, and documentation of the encounter. ? ? ?

## 2021-03-23 ENCOUNTER — Other Ambulatory Visit: Payer: Self-pay | Admitting: Internal Medicine

## 2021-03-23 DIAGNOSIS — K2100 Gastro-esophageal reflux disease with esophagitis, without bleeding: Secondary | ICD-10-CM

## 2021-03-24 DIAGNOSIS — N390 Urinary tract infection, site not specified: Secondary | ICD-10-CM | POA: Diagnosis not present

## 2021-03-30 ENCOUNTER — Encounter: Payer: Self-pay | Admitting: Internal Medicine

## 2021-03-30 ENCOUNTER — Ambulatory Visit (INDEPENDENT_AMBULATORY_CARE_PROVIDER_SITE_OTHER): Payer: Medicare Other | Admitting: Internal Medicine

## 2021-03-30 VITALS — BP 140/76 | HR 63 | Temp 98.1°F | Ht 64.0 in | Wt 98.0 lb

## 2021-03-30 DIAGNOSIS — K2100 Gastro-esophageal reflux disease with esophagitis, without bleeding: Secondary | ICD-10-CM | POA: Diagnosis not present

## 2021-03-30 DIAGNOSIS — F513 Sleepwalking [somnambulism]: Secondary | ICD-10-CM | POA: Insufficient documentation

## 2021-03-30 DIAGNOSIS — R64 Cachexia: Secondary | ICD-10-CM | POA: Diagnosis not present

## 2021-03-30 DIAGNOSIS — R0609 Other forms of dyspnea: Secondary | ICD-10-CM

## 2021-03-30 DIAGNOSIS — D539 Nutritional anemia, unspecified: Secondary | ICD-10-CM | POA: Diagnosis not present

## 2021-03-30 DIAGNOSIS — Z23 Encounter for immunization: Secondary | ICD-10-CM

## 2021-03-30 DIAGNOSIS — D508 Other iron deficiency anemias: Secondary | ICD-10-CM | POA: Diagnosis not present

## 2021-03-30 LAB — BRAIN NATRIURETIC PEPTIDE: Pro B Natriuretic peptide (BNP): 162 pg/mL — ABNORMAL HIGH (ref 0.0–100.0)

## 2021-03-30 LAB — CBC WITH DIFFERENTIAL/PLATELET
Basophils Absolute: 0 10*3/uL (ref 0.0–0.1)
Basophils Relative: 0.5 % (ref 0.0–3.0)
Eosinophils Absolute: 0.3 10*3/uL (ref 0.0–0.7)
Eosinophils Relative: 4 % (ref 0.0–5.0)
HCT: 34.7 % — ABNORMAL LOW (ref 36.0–46.0)
Hemoglobin: 11.4 g/dL — ABNORMAL LOW (ref 12.0–15.0)
Lymphocytes Relative: 12.1 % (ref 12.0–46.0)
Lymphs Abs: 1 10*3/uL (ref 0.7–4.0)
MCHC: 32.7 g/dL (ref 30.0–36.0)
MCV: 93.1 fl (ref 78.0–100.0)
Monocytes Absolute: 0.8 10*3/uL (ref 0.1–1.0)
Monocytes Relative: 9.7 % (ref 3.0–12.0)
Neutro Abs: 6 10*3/uL (ref 1.4–7.7)
Neutrophils Relative %: 73.7 % (ref 43.0–77.0)
Platelets: 282 10*3/uL (ref 150.0–400.0)
RBC: 3.73 Mil/uL — ABNORMAL LOW (ref 3.87–5.11)
RDW: 15.3 % (ref 11.5–15.5)
WBC: 8.1 10*3/uL (ref 4.0–10.5)

## 2021-03-30 LAB — IBC + FERRITIN
Ferritin: 30.9 ng/mL (ref 10.0–291.0)
Iron: 21 ug/dL — ABNORMAL LOW (ref 42–145)
Saturation Ratios: 6 % — ABNORMAL LOW (ref 20.0–50.0)
TIBC: 351.4 ug/dL (ref 250.0–450.0)
Transferrin: 251 mg/dL (ref 212.0–360.0)

## 2021-03-30 LAB — TSH: TSH: 4.81 u[IU]/mL (ref 0.35–5.50)

## 2021-03-30 LAB — TROPONIN I (HIGH SENSITIVITY): High Sens Troponin I: 9 ng/L (ref 2–17)

## 2021-03-30 LAB — FOLATE: Folate: 17.2 ng/mL (ref >5.9)

## 2021-03-30 LAB — VITAMIN B12: Vitamin B-12: 905 pg/mL (ref 211–911)

## 2021-03-30 MED ORDER — MIRTAZAPINE 7.5 MG PO TABS: 7.5000 mg | ORAL_TABLET | Freq: Every day | ORAL | 1 refills | Status: DC

## 2021-03-30 MED ORDER — OMEPRAZOLE 20 MG PO CPDR: DELAYED_RELEASE_CAPSULE | ORAL | 1 refills | Status: DC

## 2021-03-30 MED ORDER — SHINGRIX 50 MCG/0.5ML IM SUSR: 0.5000 mL | Freq: Once | INTRAMUSCULAR | 1 refills | Status: AC

## 2021-03-30 NOTE — Progress Notes (Signed)
? ?Subjective:  ?Patient ID: Julie Silva, female    DOB: 1927-02-28  Age: 88 y.o. MRN: [id number] ? ?CC: Anemia ? ?This visit occurred during the SARS-CoV-2 public health emergency.  Safety protocols were in place, including screening questions prior to the visit, additional usage of staff PPE, and extensive cleaning of exam room while observing appropriate contact time as indicated for disinfecting solutions.   ? ?HPI ?Julie Silva presents for f/up -  ? ?She complains of a several week history of dyspnea on exertion.  She denies chest pain, diaphoresis, dizziness, lightheadedness, or edema.  Her daughter tells me that when the patient is spending the night by herself she wakes up during the night and hears her children talking and walks to the kitchen table and sits there and looks for them.  She would like for her mother to see a sleep specialist. ? ?Outpatient Medications Prior to Visit  ?Medication Sig Dispense Refill  ? acetaminophen (TYLENOL) 500 MG tablet Take 500 mg by mouth 2 (two) times daily.    ? amiodarone (PACERONE) 200 MG tablet Take 200 mg by mouth daily.    ? bimatoprost (LUMIGAN) 0.01 % SOLN Place 1 drop into both eyes at bedtime.    ? Catheters MISC Replace catheter twice daily. 180 each 1  ? cefdinir (OMNICEF) 250 MG/5ML suspension Take 250 mg by mouth daily.    ? dorzolamide-timolol (COSOPT) 22.3-6.8 MG/ML ophthalmic solution Place 1 drop into both eyes 2 (two) times daily.     ? ELIQUIS 2.5 MG TABS tablet TAKE 1 TABLET TWICE A DAY 180 tablet 0  ? estradiol (ESTRACE) 0.1 MG/GM vaginal cream Place 1 Applicatorful vaginally at bedtime.    ? saccharomyces boulardii (FLORASTOR) 250 MG capsule Take 250 mg by mouth daily.    ? Wheat Dextrin (BENEFIBER PO) Take 1 mg by mouth daily.    ? mirtazapine (REMERON) 7.5 MG tablet TAKE 1 TABLET BY MOUTH AT BEDTIME. 90 tablet 0  ? Multiple Vitamins-Minerals (PRESERVISION AREDS PO) Take 1 tablet by mouth 2 (two) times daily. Chewable tablets    ? omeprazole  (PRILOSEC) 20 MG capsule TAKE 1 CAPSULE BY MOUTH EVERY DAY 90 capsule 1  ? ?No facility-administered medications prior to visit.  ? ? ?ROS ?Review of Systems  ?Constitutional:  Negative for diaphoresis, fatigue and unexpected weight change.  ?HENT:  Positive for trouble swallowing.   ?Eyes: Negative.   ?Respiratory:  Positive for shortness of breath. Negative for cough, chest tightness and wheezing.   ?Cardiovascular:  Negative for chest pain, palpitations and leg swelling.  ?Gastrointestinal:  Negative for abdominal pain, blood in stool, constipation, diarrhea and vomiting.  ?Genitourinary: Negative.  Negative for difficulty urinating.  ?Musculoskeletal: Negative.   ?Skin: Negative.   ?Neurological:  Negative for dizziness, weakness, light-headedness and headaches.  ?Hematological:  Negative for adenopathy. Does not bruise/bleed easily.  ?Psychiatric/Behavioral:  Positive for confusion, decreased concentration and sleep disturbance. Negative for behavioral problems. The patient is not nervous/anxious.   ? ?Objective:  ?BP 140/76 (BP Location: Left Arm, Patient Position: Sitting, Cuff Size: Normal)   Pulse 63   Temp 98.1 ?F (36.7 ?C) (Oral)   Ht '5\' 4"'$  (1.626 m)   Wt 98 lb (44.5 kg)   SpO2 97%   BMI 16.82 kg/m?  ? ?BP Readings from Last 3 Encounters:  ?03/30/21 140/76  ?03/16/21 140/72  ?02/26/21 112/76  ? ? ?Wt Readings from Last 3 Encounters:  ?03/30/21 98 lb (44.5 kg)  ?03/16/21 101 lb (  45.8 kg)  ?02/26/21 101 lb 6 oz (46 kg)  ? ? ?Physical Exam ?Vitals reviewed.  ?HENT:  ?   Nose: Nose normal.  ?   Mouth/Throat:  ?   Mouth: Mucous membranes are moist.  ?Eyes:  ?   General: No scleral icterus. ?   Conjunctiva/sclera: Conjunctivae normal.  ?Cardiovascular:  ?   Rate and Rhythm: Regular rhythm. Bradycardia present.  ?   Heart sounds: Normal heart sounds, S1 normal and S2 normal. No murmur heard. ?   Comments: EKG- ?Atrial-sensed ventricular-paced rhythm, 59 bpm ?TWI in V1V2 - new ?No Q waves ?Pulmonary:  ?    Effort: Pulmonary effort is normal.  ?   Breath sounds: No stridor. No wheezing, rhonchi or rales.  ?Abdominal:  ?   General: Abdomen is flat.  ?   Palpations: There is no mass.  ?   Tenderness: There is no abdominal tenderness. There is no guarding.  ?   Hernia: No hernia is present.  ?Musculoskeletal:  ?   Cervical back: No tenderness.  ?   Right lower leg: No edema.  ?   Left lower leg: No edema.  ?Lymphadenopathy:  ?   Cervical: No cervical adenopathy.  ?Skin: ?   General: Skin is warm and dry.  ?   Coloration: Skin is pale.  ?Neurological:  ?   General: No focal deficit present.  ?   Mental Status: Mental status is at baseline.  ?Psychiatric:     ?   Mood and Affect: Mood normal.     ?   Behavior: Behavior normal.  ? ? ?Lab Results  ?Component Value Date  ? WBC 8.1 03/30/2021  ? HGB 11.4 (L) 03/30/2021  ? HCT 34.7 (L) 03/30/2021  ? PLT 282.0 03/30/2021  ? GLUCOSE 89 07/30/2020  ? CHOL 168 04/04/2016  ? TRIG 105.0 04/04/2016  ? HDL 58.10 04/04/2016  ? [place] 89 04/04/2016  ? ALT 9 07/30/2020  ? AST 19 07/30/2020  ? NA 141 07/30/2020  ? K 4.3 07/30/2020  ? CL 104 07/30/2020  ? CREATININE 0.91 10/29/2020  ? BUN 19 10/29/2020  ? CO2 29 07/30/2020  ? TSH 4.81 03/30/2021  ? INR 0.9 07/03/2012  ? HGBA1C 5.5 05/07/2013  ? ? ?CT ABDOMEN PELVIS W CONTRAST ? ?Result Date: 11/09/2020 ?CLINICAL DATA:  Patient complains of left lower quadrant abdominal pain with diarrhea. History of skin cancer, hysterectomy and a pacemaker. EXAM: CT ABDOMEN AND PELVIS WITH CONTRAST TECHNIQUE: Multidetector CT imaging of the abdomen and pelvis was performed using the standard protocol following bolus administration of intravenous contrast. CONTRAST:  40m OMNIPAQUE IOHEXOL 350 MG/ML SOLN COMPARISON:  CT chest 09/25/2020; x-ray abdomen 02/06/2017; U/S renal 04/15/2013; CT abdomen-pelvis/MR abdomen 02/03/2013. FINDINGS: Lower chest: No significant pulmonary nodules or acute consolidative airspace disease. Leadless ICD in the right ventricular  apex. Hepatobiliary: Normal liver size. Granulomatous lateral segment left liver calcification is unchanged. Subcentimeter hypodense left liver dome lesion is too small to characterize and unchanged, considered benign. No new liver lesions. Cholecystectomy. Bile ducts are stable and within normal post cholecystectomy limits with CBD diameter 6 mm. Intrahepatic pneumobilia as on the prior scan, presumably due to a history of sphincterotomy. Pancreas: A few small cystic pancreatic lesions scattered throughout the pancreas, largest 1.3 cm in the anterior pancreatic body (series 5/image 44), difficult to compare to the prior unenhanced CT abdomen study. No pancreatic duct dilation. Spleen: Normal size spleen. Stable scattered granulomatous splenic calcifications. Adrenals/Urinary Tract: Normal adrenals. Simple 2.2  cm medial lower left renal cyst. Several subcentimeter hypodense renal cortical lesions in both kidneys are too small to characterize and require no follow-up. No hydronephrosis. Small cystocele. Bladder is not significantly distended and contains a tiny amount of nonspecific layering hyperdense material within the cystocele (series 2/image 73). Tiny focus of gas in the nondependent bladder lumen. Stomach/Bowel: Small hiatal hernia. Otherwise normal nondistended stomach. Normal caliber small bowel with no small bowel wall thickening. Several small bowel loops are located within a small enterocele in the deep pelvis (series 2/image 73). Appendectomy. Oral contrast transits to the rectum. Marked sigmoid diverticulosis, with no significant large bowel wall thickening or acute pericolonic fat stranding. Vascular/Lymphatic: Atherosclerotic nonaneurysmal abdominal aorta. Patent portal, splenic, hepatic and renal veins. No pathologically enlarged lymph nodes in the abdomen or pelvis. Reproductive: Status post hysterectomy. There is evidence of previous vaginal-pexy from the hysterectomy margin to the midline anterior  upper sacral promontory. There is new prominent feculent material and enteric contrast distending the vagina (series 6/image 49), likely indicating a rectovaginal fistula. Other: No pneumoperitoneum, ascites o

## 2021-03-30 NOTE — Patient Instructions (Signed)
Anemia ?Anemia is a condition in which there is not enough red blood cells or hemoglobin in the blood. Hemoglobin is a substance in red blood cells that carries oxygen. ?When you do not have enough red blood cells or hemoglobin (are anemic), your body cannot get enough oxygen and your organs may not work properly. As a result, you may feel very tired or have other problems. ?What are the causes? ?Common causes of anemia include: ?Excessive bleeding. Anemia can be caused by excessive bleeding inside or outside the body, including bleeding from the intestines or from heavy menstrual periods in females. ?Poor nutrition. ?Long-lasting (chronic) kidney, thyroid, and liver disease. ?Bone marrow disorders, spleen problems, and blood disorders. ?Cancer and treatments for cancer. ?HIV (human immunodeficiency virus) and AIDS (acquired immunodeficiency syndrome). ?Infections, medicines, and autoimmune disorders that destroy red blood cells. ?What are the signs or symptoms? ?Symptoms of this condition include: ?Minor weakness. ?Dizziness. ?Headache, or difficulties concentrating and sleeping. ?Heartbeats that feel irregular or faster than normal (palpitations). ?Shortness of breath, especially with exercise. ?Pale skin, lips, and nails, or cold hands and feet. ?Indigestion and nausea. ?Symptoms may occur suddenly or develop slowly. If your anemia is mild, you may not have symptoms. ?How is this diagnosed? ?This condition is diagnosed based on blood tests, your medical history, and a physical exam. In some cases, a test may be needed in which cells are removed from the soft tissue inside of a bone and looked at under a microscope (bone marrow biopsy). Your health care provider may also check your stool (feces) for blood and may do additional testing to look for the cause of your bleeding. ?Other tests may include: ?Imaging tests, such as a CT scan or MRI. ?A procedure to see inside your esophagus and stomach (endoscopy). ?A  procedure to see inside your colon and rectum (colonoscopy). ?How is this treated? ?Treatment for this condition depends on the cause. If you continue to lose a lot of blood, you may need to be treated at a hospital. Treatment may include: ?Taking supplements of iron, vitamin E09, or folic acid. ?Taking a hormone medicine (erythropoietin) that can help to stimulate red blood cell growth. ?Having a blood transfusion. This may be needed if you lose a lot of blood. ?Making changes to your diet. ?Having surgery to remove your spleen. ?Follow these instructions at home: ?Take over-the-counter and prescription medicines only as told by your health care provider. ?Take supplements only as told by your health care provider. ?Follow any diet instructions that you were given by your health care provider. ?Keep all follow-up visits as told by your health care provider. This is important. ?Contact a health care provider if: ?You develop new bleeding anywhere in the body. ?Get help right away if: ?You are very weak. ?You are short of breath. ?You have pain in your abdomen or chest. ?You are dizzy or feel faint. ?You have trouble concentrating. ?You have bloody stools, black stools, or tarry stools. ?You vomit repeatedly or you vomit up blood. ?These symptoms may represent a serious problem that is an emergency. Do not wait to see if the symptoms will go away. Get medical help right away. Call your local emergency services (911 in the [place] Do not drive yourself to the hospital. ?Summary ?Anemia is a condition in which you do not have enough red blood cells or enough of a substance in your red blood cells that carries oxygen (hemoglobin). ?Symptoms may occur suddenly or develop slowly. ?If your anemia is  mild, you may not have symptoms. ?This condition is diagnosed with blood tests, a medical history, and a physical exam. Other tests may be needed. ?Treatment for this condition depends on the cause of the anemia. ?This  information is not intended to replace advice given to you by your health care provider. Make sure you discuss any questions you have with your health care provider. ?Document Revised: 11/27/2018 Document Reviewed: 11/27/2018 ?Elsevier Patient Education ? 2022 Elsevier Inc[employer] ? ?

## 2021-03-31 ENCOUNTER — Telehealth: Payer: Self-pay | Admitting: Oncology

## 2021-03-31 NOTE — Telephone Encounter (Signed)
Scheduled appt per 3/29 referral. Pt's daughter is aware of appt date and time. Pt's daughter is aware to arrive 15 mins prior to appt time and to bring and updated insurance card. Pt's daughter is aware of appt location.   ?

## 2021-04-01 ENCOUNTER — Encounter: Payer: Self-pay | Admitting: Internal Medicine

## 2021-04-02 LAB — RETICULOCYTES
ABS Retic: 54740 cells/uL (ref 20000–80000)
Retic Ct Pct: 1.4 %

## 2021-04-02 LAB — VITAMIN B1: Vitamin B1 (Thiamine): 16 nmol/L (ref 8–30)

## 2021-04-02 NOTE — Telephone Encounter (Signed)
Pts daughter checking status of request made below ? ?She is requesting a cb [phone] ?

## 2021-04-05 NOTE — Telephone Encounter (Signed)
Pts daughter states pt is declining quickly and inquiring if iron fusion can be done before 4-12 ? ?Daughter states pt is not eating and very weak ? ?She is requesting a cb [phone] ?

## 2021-04-06 ENCOUNTER — Telehealth: Payer: Self-pay | Admitting: *Deleted

## 2021-04-06 ENCOUNTER — Other Ambulatory Visit: Payer: Self-pay | Admitting: Internal Medicine

## 2021-04-06 NOTE — Telephone Encounter (Signed)
-----   Message from Wyatt Portela, MD sent at 04/06/2021 10:40 AM EDT ----- ?Regarding: RE: New Patient Appointment ?I guessed that. See my previous answer. Thanks ?----- Message ----- ?From: Rolene Course, RN ?Sent: 04/06/2021  10:27 AM EDT ?To: Wyatt Portela, MD ?Subject: RE: New Patient Appointment                   ? ?So very sorry, it's Julie Silva. ? ?----- Message ----- ?From: Wyatt Portela, MD ?Sent: 04/06/2021  10:20 AM EDT ?To: Rolene Course, RN ?Subject: RE: New Patient Appointment                   ? ?You did not attach patient to this message.  I am assuming this is a 88 year old that is seeing me as a new patient consultation.  I looked at her labs and her deterioration has nothing to do with her iron.  If she is concerned she needs to call her primary care or take her to the hospital. ?----- Message ----- ?From: Rolene Course, RN ?Sent: 04/06/2021  10:07 AM EDT ?To: Wyatt Portela, MD ?Subject: New Patient Appointment                       ? ?This patient has a new patient appointment with you on April 12 for low iron levels.  Her daughter called this morning & wants to know if she can be sooner, she says she is deteriorating & becoming very weak.  Please advise. ? ?Thanks, ?Bethena Roys ? ? ? ? ?

## 2021-04-06 NOTE — Telephone Encounter (Signed)
PC to patient's daughter, Rosiland Oz, informed her of Dr. Hazeline Junker advice below - she verbalizes understanding. ?

## 2021-04-09 DIAGNOSIS — R0609 Other forms of dyspnea: Secondary | ICD-10-CM | POA: Diagnosis not present

## 2021-04-09 DIAGNOSIS — I5189 Other ill-defined heart diseases: Secondary | ICD-10-CM | POA: Diagnosis not present

## 2021-04-09 DIAGNOSIS — I480 Paroxysmal atrial fibrillation: Secondary | ICD-10-CM | POA: Diagnosis not present

## 2021-04-13 ENCOUNTER — Other Ambulatory Visit: Payer: Self-pay | Admitting: Internal Medicine

## 2021-04-13 ENCOUNTER — Ambulatory Visit (INDEPENDENT_AMBULATORY_CARE_PROVIDER_SITE_OTHER): Payer: Medicare Other | Admitting: Primary Care

## 2021-04-13 ENCOUNTER — Encounter: Payer: Self-pay | Admitting: Primary Care

## 2021-04-13 ENCOUNTER — Ambulatory Visit (INDEPENDENT_AMBULATORY_CARE_PROVIDER_SITE_OTHER): Payer: Medicare Other

## 2021-04-13 VITALS — BP 112/70 | HR 73 | Temp 97.7°F | Ht 66.0 in | Wt 98.6 lb

## 2021-04-13 DIAGNOSIS — G4734 Idiopathic sleep related nonobstructive alveolar hypoventilation: Secondary | ICD-10-CM

## 2021-04-13 DIAGNOSIS — J471 Bronchiectasis with (acute) exacerbation: Secondary | ICD-10-CM | POA: Diagnosis not present

## 2021-04-13 DIAGNOSIS — R0609 Other forms of dyspnea: Secondary | ICD-10-CM | POA: Diagnosis not present

## 2021-04-13 DIAGNOSIS — J9601 Acute respiratory failure with hypoxia: Secondary | ICD-10-CM | POA: Diagnosis not present

## 2021-04-13 DIAGNOSIS — J479 Bronchiectasis, uncomplicated: Secondary | ICD-10-CM

## 2021-04-13 DIAGNOSIS — J9621 Acute and chronic respiratory failure with hypoxia: Secondary | ICD-10-CM

## 2021-04-13 DIAGNOSIS — I4891 Unspecified atrial fibrillation: Secondary | ICD-10-CM | POA: Diagnosis not present

## 2021-04-13 DIAGNOSIS — I480 Paroxysmal atrial fibrillation: Secondary | ICD-10-CM | POA: Insufficient documentation

## 2021-04-13 DIAGNOSIS — G479 Sleep disorder, unspecified: Secondary | ICD-10-CM | POA: Insufficient documentation

## 2021-04-13 DIAGNOSIS — J9691 Respiratory failure, unspecified with hypoxia: Secondary | ICD-10-CM | POA: Insufficient documentation

## 2021-04-13 MED ORDER — MELATONIN 1 MG PO CAPS: 1.0000 mg | ORAL_CAPSULE | Freq: Every day | ORAL | 0 refills | Status: DC

## 2021-04-13 MED ORDER — DOXYCYCLINE HYCLATE 100 MG PO TABS: 100.0000 mg | ORAL_TABLET | Freq: Two times a day (BID) | ORAL | 0 refills | Status: DC

## 2021-04-13 NOTE — Assessment & Plan Note (Addendum)
-   Following with cardiology. Ordered for stress echo. On amiodarone since feb 2023, no events since starting. Cont amiodarone '200mg'$  daily and Eliquis 2.5 BID.  ?

## 2021-04-13 NOTE — Patient Instructions (Addendum)
Recommendations: ?Take melatonin 1-'3mg'$  at bedtime for sleep  ?Try Children's Claritin and robitussin liquid for cough  ?Wear 2L with exertion and at bedtime, goal maintain O2 >88-90%  ?Cancelling over night oximetry since she qualified for oxygen with exertion  ? ?Orders: ?CXR today (ordered) ?New oxygen start 2L with exertion and at bedtime  ? ?Follow-up: ?2 weeks with [name] (cancel sleep consult with TP next week) ? ? ?Home Oxygen Use, Adult ?When a medical condition keeps you from getting enough oxygen, your health care provider may instruct you to take extra oxygen at home. Your health care provider will let you know: ?When to take oxygen. ?How long to take oxygen. ?How quickly oxygen should be delivered (flow rate), in liters per minute (LPM or L/M). ?Home oxygen can be given through: ?A mask. ?A nasal cannula. This is a device or tube that goes in the nostrils. ?A transtracheal catheter. This is a small, thin tube placed in the windpipe (trachea). ?A breathing tube (tracheostomy tube) that is surgically placed in the windpipe. This may be used in severe cases. ?These devices are connected with tubing to an oxygen source, such as: ?A tank. Tanks hold oxygen in gas form. They must be replaced when the oxygen is used up. ?A liquid oxygen device. This holds oxygen in liquid form. Liquid oxygen is very cold. It must be replaced when the oxygen is used up. ?An oxygen concentrator machine. This filters oxygen in the room. There are two types of oxygen concentrator machines--stationary and portable. ?A stationary oxygen concentrator machine plugs into the main electricity supply at your home. You must have a backup cylinder of oxygen in case the power goes out. ?A portable oxygen concentrator machine is smaller in size and more lightweight. This machine uses battery supply and can be used outside the home. ?Work with your health care provider to find equipment that works best for you and your lifestyle. ?What are the  risks? ?Delivery of supplemental oxygen is generally safe. However, some risks include: ?Fire. This can happen if the oxygen is exposed to a heat source, flame, or spark. ?Injury to skin. This can happen if liquid oxygen touches your skin. ?Damage to the lungs or other organs. This can happen from getting too little or too much oxygen. ?Supplies needed: ?To use oxygen, you will need: ?A mask, nasal cannula, transtracheal catheter, or tracheostomy. ?An oxygen tank, a liquid oxygen device, or an oxygen concentrator. ?The tape that your health care provider recommends (optional). ?Your health care provider may also recommend: ?A humidifier to warm and moisten the oxygen delivered. This will depend on how much oxygen you need and the type of home oxygen device you use. ?A pulse oximeter. This device measures the percentage of oxygen in your blood. ?How to use oxygen ?Your health care provider or a person from your [facility] will show you how to use your oxygen device. Follow his or her instructions. The instructions may look something like this: ?Wash your hands with soap and water. ?If you use an oxygen concentrator, make sure it is plugged in. ?Place one end of the tube into the port on the tank, device, or machine. ?Place the mask over your nose and mouth. Or, place the nasal cannula and secure it with tape if instructed. If you use a tracheostomy or transtracheal catheter, connect it to the oxygen source as directed. ?Make sure the liter-flow setting on the machine is at the level prescribed by your health care provider. ?  Turn on the machine or adjust the knob on the tank or device to the correct liter-flow setting. ?When you are done, turn off and unplug the machine, or turn the knob to OFF. ?How to clean and care for the oxygen supplies ?Nasal cannula ?Clean it with a warm, wet cloth daily or as needed. ?Wash it with a liquid soap once a week. ?Rinse it thoroughly once or twice a week. ?Air-dry  it. ?Replace it every 2-4 weeks. ?If you have an infection, such as a cold or pneumonia, change the cannula when you get better. ?Mask ?Replace it every 2-4 weeks. ?If you have an infection, such as a cold or pneumonia, change the mask when you get better. ?Humidifier bottle ?Wash the bottle between each refill: ?Wash it with soap and warm water. ?Rinse it thoroughly. ?Clean it and its top with a disinfectant cleaner. ?Air-dry it. ?Make sure it is dry before you refill it. ?Oxygen concentrator ?Clean the air filter at least twice a week according to directions from your home medical equipment and service company. ?Wipe down the cabinet every day. To do this: ?Unplug the unit. ?Wipe down the cabinet with a damp cloth. ?Dry the cabinet. ?Other equipment ?Change any extra tubing every 1-3 months. ?Follow instructions from your health care provider about taking care of any other equipment. ?Safety tips ?Fire Stage manager ? ?Keep your oxygen and oxygen supplies at least 6 ft (2 m) away from sources of heat, flames, and sparks at all times. ?Do not allow smoking near your oxygen. Put up "no smoking" signs in your home. Avoid smoking areas when in public. ?Do not use materials that can burn (are flammable) while you use oxygen. This includes: ?Petroleum jelly. ?Hair spray or other aerosol sprays. ?Rubbing alcohol. ?Hand sanitizer. ?When you go to a restaurant with portable oxygen, ask to be seated in the non-smoking section. ?Keep a Data processing manager close by. Let your fire department know that you have oxygen in your home. ?Test your home smoke detectors regularly. ?Traveling ?Secure your oxygen tank in the vehicle so that it does not move around. Follow instructions from your medical device company about how to safely secure your tank. ?Make sure you have enough oxygen for the amount of time you will be away from home. ?If you are planning to travel by public transportation (airplane, train, bus, or boat), contact the  company to find out if it allows the use of an approved portable oxygen concentrator. You may also need documents from your health care provider and medical device company before you travel. ?General safety tips ?If you use an oxygen cylinder, make sure it is in a stand or secured to an object that will not move (fixed object). ?If you use liquid oxygen, make sure its container is kept upright at all times. ?If you use an oxygen concentrator: ?Dance movement psychotherapist company. Make sure you are given priority service in the event that your power goes out. ?Avoid using extension cords if possible. ?Follow these instructions at home: ?Use oxygen only as told by your health care provider. ?Do not use alcohol or other drugs that make you relax (sedating drugs) unless instructed. They can slow down your breathing rate and make it hard to get in enough oxygen. ?Know how and when to order a refill of oxygen. ?Always keep a spare tank of oxygen. Plan ahead for holidays when you may not be able to get a prescription filled. ?Use water-based lubricants on your lips or  nostrils. Do not use oil-based products like petroleum jelly. ?To prevent skin irritation on your cheeks or behind your ears, tuck some gauze under the tubing. ?Where to find more information ?American Lung Association: DiabeticMale.de ?Contact a health care provider if: ?You get headaches often. ?You have a lasting cough. ?You are restless or have anxiety. ?You develop an illness that affects your breathing. ?You cannot exercise at your regular level. ?You have a fever. ?You have persistent redness under your nose. ?Get help right away if: ?You are confused. ?You are sleepy all the time. ?You have blue lips or fingernails. ?You have difficult or irregular breathing that is getting worse. ?You are struggling to breathe. ?These symptoms may represent a serious problem that is an emergency. Do not wait to see if the symptoms will go away. Get medical help right  away. Call your local emergency services (911 in the [place] Do not drive yourself to the hospital. ?Summary ?Your health care provider or a person from your [facility] will show you how to use your oxyg

## 2021-04-13 NOTE — Assessment & Plan Note (Signed)
-   Patient recently started talking in her sleep. Her sleep is very restless and fragmented. She sleeps with her mouth open and complains of dry mouth in the morning. No snoring or apnea observed. Low suspicion for OSA at this time, likely would not tolerate CPAP. Recommending patient trial melatonin 1-'3mg'$  at bedtime. FU in 2 weeks.  ?

## 2021-04-13 NOTE — Progress Notes (Addendum)
y ? ?'@Patient'$  ID: Julie Silva, female    DOB: 04-14-1927, 88 y.o.   MRN: [id number] ? ?Chief Complaint  ?Patient presents with  ? Follow-up  ?  SOB on exertion  ? ? ?Referring provider: ?Janith Lima, MD ? ?HPI: ?88 year old female, never smoked. PMH significant bronchiectasis, HTN, atherosclerosis or aorta, diastolic dysfunction, hyperlipidemia. Patient of Dr. Vaughan Browner, last seen on 09/30/20. CT imaging showed predominantly right middle lobe bronchiectasis, waxing and waning tree-in-bed opacities and consolidative changes at the bases suggestive of MAI infection. Worsening cavitary lesions. It was felt patient would likely not tolerate bronchoscopy or treatment for MAI if confirmed d.t age. Advised FU in 1 year.  ? ?04/13/2021  ?Patient presents today for acute OV d/t oxygen desaturation. She was scheduled for sleep consult next week but daughter wanted her to be seen sooner d/t concern for nocturnal hypoxia. She reports very restless sleep. Her daughter has noticed her oxygen level as low 88% at night.  O2 during the daytime has been staying >90-91%. She only walks short distances, typically 50 feet to the bathroom and uses walker. Cough has worsened over the past week. Difficulty clearing mucus. She is getting up grey colored sputum. She has dry mouth in the morning when she wakes up. Sleeps with her mouth open. She has fatigue and shortness of breath with exertion. Scheduled for iron transfusion tomorrow. She self cath's twice a day. She does not current have any UTI symptoms. She is on cefdinir daily prophylaxis for recurrent/resistant UTIs.  Saw cardiologist with [facility] recently they ordered stress echo. No signs of fluid overload. She was started amiodarone in February for afib, no recent episodes.  ? ?Allergies  ?Allergen Reactions  ? Codeine Other (See Comments)  ?  Passed out  ? Colchicine Diarrhea  ? ? ?Immunization History  ?Administered Date(s) Administered  ? Fluad Quad(high Dose 65+) 12/11/2018,  12/25/2019  ? Influenza, High Dose Seasonal PF 10/03/2013, 09/25/2014, 10/06/2015, 10/11/2016, 10/26/2017  ? Influenza-Unspecified 10/03/2012, 02/02/2021  ? PFIZER(Purple Top)SARS-COV-2 Vaccination 01/29/2019, 02/12/2019, 11/25/2019  ? Pneumococcal Conjugate-13 05/07/2013  ? Pneumococcal Polysaccharide-23 06/09/2010  ? Td 04/03/2000  ? Tdap 05/07/2013  ? Zoster, Live 06/08/2011  ? ? ?Past Medical History:  ?Diagnosis Date  ? Anemia   ? Aortic atherosclerosis ([place])   ? Arthritis   ? Osteoporosis, neck stiffness  ? Atrial fibrillation ([place])   ? Bleeds easily ([place])   ? "father was a hemophiliac" - pt not being bothered in recent years.  ? CAD (coronary artery disease)   ? Calcium deposit in bursa of knee 2017  ? Cancer [facility])   ? skin cancer "basal cell".  ? Chicken pox   ? Cyst, Baker's knee   ? left knee- tx. Cortisone injection 05-05-15 in office- "improved pain relief and swelling left ankle and knee"  ? Diverticulitis   ? Dysrhythmia   ? Dr. McAlhany-cardiology  ? GERD (gastroesophageal reflux disease)   ? Glaucoma of both eyes   ? Gout   ? Hearing loss of both ears   ? Bilateral ears- hearing aids used- right ear hearing betther than left.  ? Hiatal hernia   ? History of blood transfusion 1957  ? "lots; most related to my periods"-last 1968 -s/p Hysterectomy  ? Migraines   ? "years ago" (02/04/2013)  ? Motion sickness   ? back seat - cars, sudden movements  ? PAF (paroxysmal atrial fibrillation) ([place])   ? Pancreatic cyst   ? Pelvic floor dysfunction   ?  PONV (postoperative nausea and vomiting)   ? Risk for falls   ? "wobbley" per daughter  ? Self-catheterizes urinary bladder   ? "daily- retained urine and chronic UTI"  ? SVT (supraventricular tachycardia) ([place])   ? Dr. Darnell Level. [place]-follows "loop recorder left chest"  ? Swallowing difficulty   ? freq occ. mostley liquids  ? Urine incontinence   ? UTI (urinary tract infection)   ? Chronic Urinary tract infections- tx Macrobid daily.  ? Vertigo   ? Wears hearing aid in both  ears   ? ? ?Tobacco History: ?Social History  ? ?Tobacco Use  ?Smoking Status Never  ?Smokeless Tobacco Never  ? ?Counseling given: Not Answered ? ? ?Outpatient Medications Prior to Visit  ?Medication Sig Dispense Refill  ? acetaminophen (TYLENOL) 500 MG tablet Take 500 mg by mouth 2 (two) times daily.    ? amiodarone (PACERONE) 200 MG tablet Take 200 mg by mouth daily.    ? bimatoprost (LUMIGAN) 0.01 % SOLN Place 1 drop into both eyes at bedtime.    ? Catheters MISC Replace catheter twice daily. 180 each 1  ? cefdinir (OMNICEF) 250 MG/5ML suspension Take 250 mg by mouth daily.    ? dorzolamide-timolol (COSOPT) 22.3-6.8 MG/ML ophthalmic solution Place 1 drop into both eyes 2 (two) times daily.     ? ELIQUIS 2.5 MG TABS tablet TAKE 1 TABLET TWICE A DAY 180 tablet 0  ? estradiol (ESTRACE) 0.1 MG/GM vaginal cream Place 1 Applicatorful vaginally at bedtime.    ? mirtazapine (REMERON) 7.5 MG tablet Take 1 tablet (7.5 mg total) by mouth at bedtime. 90 tablet 1  ? Multiple Vitamins-Minerals (PRESERVISION AREDS) CAPS Take 1 tablet by mouth 2 (two) times daily.    ? omeprazole (PRILOSEC) 20 MG capsule TAKE 1 CAPSULE BY MOUTH EVERY DAY 90 capsule 1  ? saccharomyces boulardii (FLORASTOR) 250 MG capsule Take 250 mg by mouth daily.    ? Wheat Dextrin (BENEFIBER PO) Take 1 mg by mouth daily.    ? ?No facility-administered medications prior to visit.  ? ? ?Review of Systems ? ?Review of Systems  ?Constitutional:  Positive for fatigue.  ?HENT:  Positive for congestion.   ?Respiratory:  Positive for cough and shortness of breath.   ?Cardiovascular:  Negative for chest pain and leg swelling.  ? ? ?Physical Exam ? ?BP 112/70 (BP Location: Left Arm, Cuff Size: Normal)   Pulse 73   Temp 97.7 ?F (36.5 ?C)   Ht '5\' 6"'$  (1.676 m)   Wt 98 lb 9.6 oz (44.7 kg)   SpO2 97%   BMI 15.91 kg/m?  ?Physical Exam ?Constitutional:   ?   Appearance: Normal appearance.  ?HENT:  ?   Head: Normocephalic and atraumatic.  ?   Mouth/Throat:  ?   Mouth:  Mucous membranes are moist.  ?   Pharynx: Oropharynx is clear.  ?Cardiovascular:  ?   Rate and Rhythm: Normal rate and regular rhythm.  ?   Comments: No edema ?Pulmonary:  ?   Effort: Pulmonary effort is normal. No respiratory distress.  ?   Breath sounds: No wheezing or rhonchi.  ?   Comments: Scant rales  ?Neurological:  ?   General: No focal deficit present.  ?   Mental Status: She is alert and oriented to person, place, and time. Mental status is at baseline.  ?Psychiatric:     ?   Mood and Affect: Mood normal.     ?   Behavior: Behavior normal.     ?  Thought Content: Thought content normal.     ?   Judgment: Judgment normal.  ?  ? ?Lab Results: ? ?CBC ?   ?Component Value Date/Time  ? WBC 8.1 03/30/2021 1446  ? RBC 3.73 (L) 03/30/2021 1446  ? HGB 11.4 (L) 03/30/2021 1446  ? HGB 12.4 02/23/2018 1525  ? HCT 34.7 (L) 03/30/2021 1446  ? HCT 36.2 02/23/2018 1525  ? PLT 282.0 03/30/2021 1446  ? PLT 239 02/23/2018 1525  ? MCV 93.1 03/30/2021 1446  ? MCV 93 02/23/2018 1525  ? MCV 93 07/03/2012 1533  ? MCH 31.9 02/23/2018 1525  ? MCH 30.9 05/08/2015 0930  ? MCHC 32.7 03/30/2021 1446  ? RDW 15.3 03/30/2021 1446  ? RDW 12.4 02/23/2018 1525  ? RDW 13.1 07/03/2012 1533  ? LYMPHSABS 1.0 03/30/2021 1446  ? LYMPHSABS 1.7 02/23/2018 1525  ? LYMPHSABS 1.2 07/03/2012 1533  ? MONOABS 0.8 03/30/2021 1446  ? MONOABS 0.6 07/03/2012 1533  ? EOSABS 0.3 03/30/2021 1446  ? EOSABS 0.1 02/23/2018 1525  ? EOSABS 0.1 07/03/2012 1533  ? BASOSABS 0.0 03/30/2021 1446  ? BASOSABS 0.0 02/23/2018 1525  ? BASOSABS 0.1 07/03/2012 1533  ? ? ?BMET ?   ?Component Value Date/Time  ? NA 141 07/30/2020 1629  ? NA 142 02/23/2018 1525  ? K 4.3 07/30/2020 1629  ? CL 104 07/30/2020 1629  ? CO2 29 07/30/2020 1629  ? GLUCOSE 89 07/30/2020 1629  ? BUN 19 10/29/2020 1106  ? BUN 17 02/23/2018 1525  ? CREATININE 0.91 10/29/2020 1106  ? CALCIUM 9.0 07/30/2020 1629  ? GFRNONAA 61 02/23/2018 1525  ? GFRAA 70 02/23/2018 1525  ? ? ?BNP ?No results found for:  BNP ? ?ProBNP ?   ?Component Value Date/Time  ? PROBNP 162.0 (H) 03/30/2021 1446  ? ? ?Imaging: ?DG Chest 2 View ? ?Result Date: 04/13/2021 ?CLINICAL DATA:  Bronchiectasis exacerbation. EXAM: CHEST - 2 VIEW COMPARISON:  [facility]

## 2021-04-13 NOTE — Assessment & Plan Note (Addendum)
-   HX bronchiectasis, worsening cavitary lesions on CT imaging. Patient not candidate for bronchoscopy or treatment for MAI. New oxygen requirements. No acute respiratory distress. O2 was 97-99% RA at rest, she desaturated to 84% p walking 50 ft requiring 2L continuous O2 to maintain sat >90%. She was sent home with TOC from [facility]. FU in 2 weeks.   ?

## 2021-04-13 NOTE — Assessment & Plan Note (Signed)
-   Chronic cough worse x 1 week, difficulty clearing mucus. CXR showed continued presence of cavitary abnormality right midlung as well as left midlung and right middle lobes densities consistent with atypical infection. No acute changes. Sending in Doxycycline '100mg'$  BID x 7 days. Advised OTC Robitussin q 4 -6 hours as needed for cough/loosen congestin.  ?

## 2021-04-14 ENCOUNTER — Inpatient Hospital Stay: Payer: Medicare Other | Attending: Oncology | Admitting: Oncology

## 2021-04-14 ENCOUNTER — Other Ambulatory Visit: Payer: Self-pay

## 2021-04-14 DIAGNOSIS — D509 Iron deficiency anemia, unspecified: Secondary | ICD-10-CM | POA: Diagnosis not present

## 2021-04-14 DIAGNOSIS — J471 Bronchiectasis with (acute) exacerbation: Secondary | ICD-10-CM | POA: Diagnosis not present

## 2021-04-14 NOTE — Progress Notes (Signed)
?Reason for the request:    Iron deficiency ? ?HPI: I was asked by Dr. Ronnald Ramp to evaluate Julie Silva for the diagnosis of iron deficiency anemia.  She is a 88 year old woman with history of coronary artery disease and bronchiectasis who was found to have low iron based on laboratory data obtained on March 30, 2021.  At that time her iron was 21 and saturation of 6%.  Ferritin is 30.9 with CBC showed hemoglobin of 11.4 and MCV of 93.1.  She had normal white cell count and normal differential.  CBC in December 2022 was 11.2 and was 10.9 in November 2022.  She does have history of chronic diarrhea and chronically anticoagulated on Eliquis.  She did have deterioration in her respiratory status and was seen by pulmonary medicine regarding her bronchiectasis. ? ?Clinically, she reports she has been quite debilitated, weak and reported dyspnea on exertion in the last 3 to 4 weeks.  She has been diagnosed with cystitis as well which could have contributed to her symptoms.  She is limited in her mobility at this time and started on oxygen supplementation. ? ?She does not report any headaches, blurry vision, syncope or seizures. Does not report any fevers, chills or sweats.  Does not report any cough, wheezing or hemoptysis.  Does not report any chest pain, palpitation, orthopnea or leg edema.  Does not report any nausea, vomiting or abdominal pain.  Does not report any constipation or diarrhea.  Does not report any skeletal complaints.    Does not report frequency, urgency or hematuria.  Does not report any skin rashes or lesions. Does not report any heat or cold intolerance.  Does not report any lymphadenopathy or petechiae.  Does not report any anxiety or depression.  Remaining review of systems is negative.  ? ? ? ?Past Medical History:  ?Diagnosis Date  ? Anemia   ? Aortic atherosclerosis ([place])   ? Arthritis   ? Osteoporosis, neck stiffness  ? Atrial fibrillation ([place])   ? Bleeds easily ([place])   ? "father was a  hemophiliac" - pt not being bothered in recent years.  ? CAD (coronary artery disease)   ? Calcium deposit in bursa of knee 2017  ? Cancer [facility])   ? skin cancer "basal cell".  ? Chicken pox   ? Cyst, Baker's knee   ? left knee- tx. Cortisone injection 05-05-15 in office- "improved pain relief and swelling left ankle and knee"  ? Diverticulitis   ? Dysrhythmia   ? Dr. McAlhany-cardiology  ? GERD (gastroesophageal reflux disease)   ? Glaucoma of both eyes   ? Gout   ? Hearing loss of both ears   ? Bilateral ears- hearing aids used- right ear hearing betther than left.  ? Hiatal hernia   ? History of blood transfusion 1957  ? "lots; most related to my periods"-last 1968 -s/p Hysterectomy  ? Migraines   ? "years ago" (02/04/2013)  ? Motion sickness   ? back seat - cars, sudden movements  ? PAF (paroxysmal atrial fibrillation) ([place])   ? Pancreatic cyst   ? Pelvic floor dysfunction   ? PONV (postoperative nausea and vomiting)   ? Risk for falls   ? "wobbley" per daughter  ? Self-catheterizes urinary bladder   ? "daily- retained urine and chronic UTI"  ? SVT (supraventricular tachycardia) ([place])   ? Dr. Darnell Level. [place]-follows "loop recorder left chest"  ? Swallowing difficulty   ? freq occ. mostley liquids  ? Urine incontinence   ?  UTI (urinary tract infection)   ? Chronic Urinary tract infections- tx Macrobid daily.  ? Vertigo   ? Wears hearing aid in both ears   ?: ? ? ?Past Surgical History:  ?Procedure Laterality Date  ? APPENDECTOMY  01/03/1946  ? BLADDER SURGERY  01/03/1993  ? Repair-   ? BREAST BIOPSY Bilateral   ? "both were fine"  ? CATARACT EXTRACTION W/ INTRAOCULAR LENS IMPLANT Left   ? CATARACT EXTRACTION W/PHACO Right 10/16/2018  ? Procedure: CATARACT EXTRACTION PHACO AND INTRAOCULAR LENS PLACEMENT ([place])  RIGHT MiLoop diabetes 01:05.8  21.0%  13.77;  Surgeon: Birder Robson, MD;  Location: [facility];  Service: Ophthalmology;  Laterality: Right;  BS tends to drop in AM and would like early surgery so she can  get home to eat, please  ? CHOLECYSTECTOMY  01/04/2012  ? COMBINED HYSTERECTOMY ABDOMINAL W/ A&P REPAIR / OOPHORECTOMY    ? DILATION AND CURETTAGE OF UTERUS    ? EP IMPLANTABLE DEVICE N/A 06/19/2014  ? Procedure: Loop Recorder Insertion;  Surgeon: Evans Lance, MD;  Location: [facility];  Service: Cardiovascular;  Laterality: N/A;  ? EXCISION OF BREAST BIOPSY Right 05/13/2015  ? Procedure: RIGHT BREAST EXCISIONAL BIOPSY x2;  Surgeon: Armandina Gemma, MD;  Location: [facility];  Service: General;  Laterality: Right;  ? PACEMAKER IMPLANT  02/2020  ? TONSILLECTOMY  01/04/1944  ? TOTAL ABDOMINAL HYSTERECTOMY    ? VAGINAL HYSTERECTOMY  01/03/1966  ? vaginal mesh    ?: ? ? ?Current Outpatient Medications:  ?  acetaminophen (TYLENOL) 500 MG tablet, Take 500 mg by mouth 2 (two) times daily., Disp: , Rfl:  ?  amiodarone (PACERONE) 200 MG tablet, Take 200 mg by mouth daily., Disp: , Rfl:  ?  bimatoprost (LUMIGAN) 0.01 % SOLN, Place 1 drop into both eyes at bedtime., Disp: , Rfl:  ?  Catheters MISC, Replace catheter twice daily., Disp: 180 each, Rfl: 1 ?  cefdinir (OMNICEF) 250 MG/5ML suspension, Take 250 mg by mouth daily., Disp: , Rfl:  ?  dorzolamide-timolol (COSOPT) 22.3-6.8 MG/ML ophthalmic solution, Place 1 drop into both eyes 2 (two) times daily. , Disp: , Rfl:  ?  doxycycline (VIBRA-TABS) 100 MG tablet, Take 1 tablet (100 mg total) by mouth 2 (two) times daily., Disp: 14 tablet, Rfl: 0 ?  ELIQUIS 2.5 MG TABS tablet, TAKE 1 TABLET TWICE A DAY, Disp: 180 tablet, Rfl: 0 ?  estradiol (ESTRACE) 0.1 MG/GM vaginal cream, Place 1 Applicatorful vaginally at bedtime., Disp: , Rfl:  ?  Melatonin 1 MG CAPS, Take 1 capsule (1 mg total) by mouth at bedtime., Disp: 30 capsule, Rfl: 0 ?  mirtazapine (REMERON) 7.5 MG tablet, Take 1 tablet (7.5 mg total) by mouth at bedtime., Disp: 90 tablet, Rfl: 1 ?  Multiple Vitamins-Minerals (PRESERVISION AREDS) CAPS, Take 1 tablet by mouth 2 (two) times daily., Disp: , Rfl:  ?  omeprazole  (PRILOSEC) 20 MG capsule, TAKE 1 CAPSULE BY MOUTH EVERY DAY, Disp: 90 capsule, Rfl: 1 ?  saccharomyces boulardii (FLORASTOR) 250 MG capsule, Take 250 mg by mouth daily., Disp: , Rfl:  ?  Wheat Dextrin (BENEFIBER PO), Take 1 mg by mouth daily., Disp: , Rfl: : ? ? ?Allergies  ?Allergen Reactions  ? Codeine Other (See Comments)  ?  Passed out  ? Colchicine Diarrhea  ?: ? ? ?Family History  ?Problem Relation Age of Onset  ? Stroke Father   ? CVA Father   ? Ovarian cancer Mother   ?  Cancer - Other Sister   ?     Breast  ? Breast cancer Sister   ? Cancer - Other Brother   ?     Throat  ? Cancer - Other Sister   ?     Throat  ? Brain cancer Sister   ? Cancer - Other Other   ?     Brain  ? Breast cancer Other   ?: ? ? ?Social History  ? ?Socioeconomic History  ? Marital status: Widowed  ?  Spouse name: Not on file  ? Number of children: 4  ? Years of education: 60  ? Highest education level: Not on file  ?Occupational History  ? Occupation: Runner, broadcasting/film/video  ?Tobacco Use  ? Smoking status: Never  ? Smokeless tobacco: Never  ?Vaping Use  ? Vaping Use: Never used  ?Substance and Sexual Activity  ? Alcohol use: No  ?  Alcohol/week: 0.0 standard drinks  ? Drug use: No  ? Sexual activity: Never  ?Other Topics Concern  ? Not on file  ?Social History Narrative  ? Lives alone in a one story home.  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   ? ?Social Determinants of Health  ? ?Financial Resource Strain: Not on file  ?Food Insecurity: Not on file  ?Transportation Needs: Not on file  ?Physical Activity: Not on file  ?Stress: Not on file  ?Social Connections: Not on file  ?Intimate Partner Violence: Not on file  ?: ? ?Pertinent items are noted in HPI. ? ?Exam: ?Blood pressure 126/63, pulse 74, temperature 97.9 ?F (36.6 ?C), resp. rate 16, height '5\' 6"'$  (1.676 m), weight 97 lb 12.8 oz (44.4 kg), SpO2 100 %. ?ECOG 2 ?General appearance: alert and cooperative appeared without distress. ?Head: atraumatic without any  abnormalities. ?Eyes: conjunctivae/corneas clear. PERRL.  Sclera anicteric. ?Throat: lips, mucosa, and tongue normal; without oral thrush or ulcers. ?Resp: clear to auscultation bilaterally without rhonchi, wheezes or dullness to per

## 2021-04-16 ENCOUNTER — Telehealth: Payer: Self-pay | Admitting: Oncology

## 2021-04-16 NOTE — Telephone Encounter (Signed)
Scheduled per 04/12 los, patient has been called and notified. ?

## 2021-04-17 ENCOUNTER — Other Ambulatory Visit: Payer: Self-pay | Admitting: Internal Medicine

## 2021-04-19 NOTE — Telephone Encounter (Signed)
Per chart med " The original prescription was discontinued on 03/16/2021 by [place], Patti E, CMA. ". ?Pls advise on refill.Marland KitchenJohny Chess ?

## 2021-04-21 ENCOUNTER — Inpatient Hospital Stay: Payer: Medicare Other

## 2021-04-21 VITALS — BP 157/59 | HR 75 | Temp 98.2°F | Resp 18 | Wt 97.8 lb

## 2021-04-21 DIAGNOSIS — J471 Bronchiectasis with (acute) exacerbation: Secondary | ICD-10-CM | POA: Diagnosis not present

## 2021-04-21 DIAGNOSIS — D509 Iron deficiency anemia, unspecified: Secondary | ICD-10-CM

## 2021-04-21 MED ORDER — SODIUM CHLORIDE 0.9 % IV SOLN: Freq: Once | INTRAVENOUS | Status: AC

## 2021-04-21 MED ORDER — SODIUM CHLORIDE 0.9 % IV SOLN
200.0000 mg | Freq: Once | INTRAVENOUS | Status: AC
  Administered 2021-04-21: 200 mg via INTRAVENOUS
  Filled 2021-04-21: qty 200

## 2021-04-21 NOTE — Progress Notes (Signed)
Patient waited 30 minutes post iron observation with no issues vss  ?

## 2021-04-21 NOTE — Patient Instructions (Signed)

## 2021-04-22 ENCOUNTER — Institutional Professional Consult (permissible substitution): Payer: Medicare Other | Admitting: Adult Health

## 2021-04-23 ENCOUNTER — Other Ambulatory Visit: Payer: Self-pay

## 2021-04-23 ENCOUNTER — Inpatient Hospital Stay: Payer: Medicare Other

## 2021-04-23 VITALS — BP 138/69 | HR 73 | Temp 97.9°F | Resp 16

## 2021-04-23 DIAGNOSIS — D509 Iron deficiency anemia, unspecified: Secondary | ICD-10-CM

## 2021-04-23 DIAGNOSIS — J471 Bronchiectasis with (acute) exacerbation: Secondary | ICD-10-CM | POA: Diagnosis not present

## 2021-04-23 MED ORDER — SODIUM CHLORIDE 0.9 % IV SOLN: Freq: Once | INTRAVENOUS | Status: AC

## 2021-04-23 MED ORDER — SODIUM CHLORIDE 0.9 % IV SOLN
200.0000 mg | Freq: Once | INTRAVENOUS | Status: AC
  Administered 2021-04-23: 200 mg via INTRAVENOUS
  Filled 2021-04-23: qty 200

## 2021-04-23 NOTE — Progress Notes (Signed)
Pt observed for 30 minutes post Venofer infusion. Pt tolerated trtmt well w/out incident. VSS at discharge.  W/C assist to lobby.   

## 2021-04-23 NOTE — Patient Instructions (Signed)

## 2021-04-26 ENCOUNTER — Encounter: Payer: Self-pay | Admitting: Internal Medicine

## 2021-04-27 ENCOUNTER — Other Ambulatory Visit (INDEPENDENT_AMBULATORY_CARE_PROVIDER_SITE_OTHER): Payer: Medicare Other

## 2021-04-27 ENCOUNTER — Other Ambulatory Visit: Payer: Self-pay | Admitting: Internal Medicine

## 2021-04-27 DIAGNOSIS — Z7901 Long term (current) use of anticoagulants: Secondary | ICD-10-CM | POA: Diagnosis not present

## 2021-04-27 DIAGNOSIS — I480 Paroxysmal atrial fibrillation: Secondary | ICD-10-CM | POA: Diagnosis not present

## 2021-04-27 DIAGNOSIS — N3000 Acute cystitis without hematuria: Secondary | ICD-10-CM

## 2021-04-27 DIAGNOSIS — Z950 Presence of cardiac pacemaker: Secondary | ICD-10-CM | POA: Diagnosis not present

## 2021-04-27 DIAGNOSIS — I5189 Other ill-defined heart diseases: Secondary | ICD-10-CM | POA: Diagnosis not present

## 2021-04-27 DIAGNOSIS — R9431 Abnormal electrocardiogram [ECG] [EKG]: Secondary | ICD-10-CM | POA: Diagnosis not present

## 2021-04-27 DIAGNOSIS — N390 Urinary tract infection, site not specified: Secondary | ICD-10-CM

## 2021-04-27 DIAGNOSIS — Z79899 Other long term (current) drug therapy: Secondary | ICD-10-CM | POA: Diagnosis not present

## 2021-04-27 DIAGNOSIS — R0609 Other forms of dyspnea: Secondary | ICD-10-CM | POA: Diagnosis not present

## 2021-04-27 DIAGNOSIS — Z45018 Encounter for adjustment and management of other part of cardiac pacemaker: Secondary | ICD-10-CM | POA: Diagnosis not present

## 2021-04-27 LAB — URINALYSIS, ROUTINE W REFLEX MICROSCOPIC
Bilirubin Urine: NEGATIVE
Ketones, ur: NEGATIVE
Leukocytes,Ua: NEGATIVE
Nitrite: POSITIVE — AB
Specific Gravity, Urine: 1.015 (ref 1.000–1.030)
Urine Glucose: NEGATIVE
Urobilinogen, UA: 0.2 (ref 0.0–1.0)
pH: 6.5 (ref 5.0–8.0)

## 2021-04-28 ENCOUNTER — Inpatient Hospital Stay: Payer: Medicare Other

## 2021-04-29 ENCOUNTER — Telehealth: Payer: Self-pay | Admitting: Hematology and Oncology

## 2021-04-29 NOTE — Telephone Encounter (Signed)
Per 4/26 in basket called and spoke to pt about appointment  pt confirmed appointment  ?

## 2021-04-30 ENCOUNTER — Encounter: Payer: Self-pay | Admitting: Primary Care

## 2021-04-30 ENCOUNTER — Ambulatory Visit (INDEPENDENT_AMBULATORY_CARE_PROVIDER_SITE_OTHER): Payer: Medicare Other | Admitting: Primary Care

## 2021-04-30 VITALS — BP 108/64 | HR 90 | Temp 97.5°F | Ht 66.0 in | Wt 95.2 lb

## 2021-04-30 DIAGNOSIS — J479 Bronchiectasis, uncomplicated: Secondary | ICD-10-CM | POA: Diagnosis not present

## 2021-04-30 DIAGNOSIS — R4789 Other speech disturbances: Secondary | ICD-10-CM | POA: Diagnosis not present

## 2021-04-30 DIAGNOSIS — J471 Bronchiectasis with (acute) exacerbation: Secondary | ICD-10-CM

## 2021-04-30 NOTE — Progress Notes (Signed)
hrct  '@Patient'$  ID: Julie Silva, female    DOB: [birth year], 88 y.o.   MRN: [id number]  Chief Complaint  Patient presents with   Follow-up    She is a little better, and even wearing oxygen feels weak but is better when wearing to help with breathing,. She has some tan colored sputum.     Referring provider: Janith Lima, MD  HPI: 88 year old female, never smoked. PMH significant bronchiectasis, HTN, atherosclerosis or aorta, diastolic dysfunction, hyperlipidemia. Patient of Dr. Vaughan Browner, last seen on 09/30/20. CT imaging showed predominantly right middle lobe bronchiectasis, waxing and waning tree-in-bed opacities and consolidative changes at the bases suggestive of MAI infection. Worsening cavitary lesions. It was felt patient would likely not tolerate bronchoscopy or treatment for MAI if confirmed d.t age. Advised FU in 1 year.   Previous [facility] pulmonary encounter: 04/13/2021  Patient presents today for acute OV d/t oxygen desaturation. She was scheduled for sleep consult next week but daughter wanted her to be seen sooner d/t concern for nocturnal hypoxia. She reports very restless sleep. Her daughter has noticed her oxygen level as low 88% at night.  O2 during the daytime has been staying >90-91%. She only walks short distances, typically 50 feet to the bathroom and uses walker. Cough has worsened over the past week. Difficulty clearing mucus. She is getting up grey colored sputum. She has dry mouth in the morning when she wakes up. Sleeps with her mouth open. She has fatigue and shortness of breath with exertion. Scheduled for iron transfusion tomorrow. She self cath's twice a day. She does not current have any UTI symptoms. She is on cefdinir daily prophylaxis for recurrent/resistant UTIs.  Saw cardiologist with [facility] recently they ordered stress echo. No signs of fluid overload. She was started amiodarone in February for afib, no recent episodes.   04/30/2021 - Interim hx  Patient presents today  for 3-4 week follow-up. Hx bronchiectasis. CT imaging showed predominantly right middle lobe bronchiectasis, waxing and waning tree-in-bed opacities and consolidative changes at the bases suggestive of MAI infection. Worsening cavitary lesions. It was felt patient would likely not tolerate bronchoscopy or treatment for MAI if confirmed d.t age. Advised FU in 1 year.   During her last office visit she was started on oxygen 2L with exertion and at bedtime. She had a CXR that showed no acute changes, continued presence of cavitary abnormality to RML as well as LML and RML densities consistent with atypical infection. She was treated with course of doxycycline x 7 days.   She is feeling some better today, oxygen has helped. She is only wearing her oxygen at bedtime. She is in a WC today, O2 96% RA at rest. She keeps a chronic cough, tan sputum. She has a hard time getting mucus up.    Allergies  Allergen Reactions   Codeine Other (See Comments)    Passed out   Colchicine Diarrhea    Immunization History  Administered Date(s) Administered   Fluad Quad(high Dose 65+) 12/11/2018, 12/25/2019   Influenza, High Dose Seasonal PF 10/03/2013, 09/25/2014, 10/06/2015, 10/11/2016, 10/26/2017, 02/02/2021   Influenza-Unspecified 10/03/2012, 11/04/2019, 02/02/2021   PFIZER(Purple Top)SARS-COV-2 Vaccination 01/29/2019, 02/12/2019, 11/25/2019   Pneumococcal Conjugate-13 05/07/2013   Pneumococcal Polysaccharide-23 06/09/2010   Td 04/03/2000   Tdap 05/07/2013   Unspecified SARS-COV-2 Vaccination 01/29/2019, 02/19/2019   Zoster, Live 06/08/2011    Past Medical History:  Diagnosis Date   Anemia    Aortic atherosclerosis ([place])    Arthritis  Osteoporosis, neck stiffness   Atrial fibrillation (HCC)    Bleeds easily [facility])    "father was a hemophiliac" - pt not being bothered in recent years.   CAD (coronary artery disease)    Calcium deposit in bursa of knee 2017   Cancer ([place])    skin cancer "basal  cell".   Chicken pox    Cyst, Baker's knee    left knee- tx. Cortisone injection 05-05-15 in office- "improved pain relief and swelling left ankle and knee"   Diverticulitis    Dysrhythmia    Dr. McAlhany-cardiology   GERD (gastroesophageal reflux disease)    Glaucoma of both eyes    Gout    Hearing loss of both ears    Bilateral ears- hearing aids used- right ear hearing betther than left.   Hiatal hernia    History of blood transfusion 1957   "lots; most related to my periods"-last 1968 -s/p Hysterectomy   Migraines    "years ago" (02/04/2013)   Motion sickness    back seat - cars, sudden movements   PAF (paroxysmal atrial fibrillation) (HCC)    Pancreatic cyst    Pelvic floor dysfunction    PONV (postoperative nausea and vomiting)    Risk for falls    "wobbley" per daughter   Self-catheterizes urinary bladder    "daily- retained urine and chronic UTI"   SVT (supraventricular tachycardia) ([place])    Dr. Darnell Level. Taylor-follows "loop recorder left chest"   Swallowing difficulty    freq occ. mostley liquids   Urine incontinence    UTI (urinary tract infection)    Chronic Urinary tract infections- tx Macrobid daily.   Vertigo    Wears hearing aid in both ears     Tobacco History: Social History   Tobacco Use  Smoking Status Never  Smokeless Tobacco Never   Counseling given: Not Answered   Outpatient Medications Prior to Visit  Medication Sig Dispense Refill   acetaminophen (TYLENOL) 500 MG tablet Take 500 mg by mouth 2 (two) times daily.     amiodarone (PACERONE) 100 MG tablet Take 100 mg by mouth daily.     bimatoprost (LUMIGAN) 0.01 % SOLN Place 1 drop into both eyes at bedtime.     Catheters MISC Replace catheter twice daily. 180 each 1   cefdinir (OMNICEF) 250 MG/5ML suspension Take 250 mg by mouth daily.     Dextromethorphan-guaiFENesin (ROBITUSSIN COUGH+CHEST CONG DM) 5-100 MG/5ML LIQD      dorzolamide-timolol (COSOPT) 22.3-6.8 MG/ML ophthalmic solution Place 1 drop  into both eyes 2 (two) times daily.      ELIQUIS 2.5 MG TABS tablet TAKE 1 TABLET TWICE A DAY 180 tablet 0   estradiol (ESTRACE) 0.1 MG/GM vaginal cream Place 1 Applicatorful vaginally at bedtime.     loratadine (CLARITIN) 5 MG/5ML syrup      mirtazapine (REMERON) 7.5 MG tablet Take 1 tablet (7.5 mg total) by mouth at bedtime. 90 tablet 1   Multiple Vitamins-Minerals (PRESERVISION AREDS) CAPS Take 1 tablet by mouth 2 (two) times daily.     omeprazole (PRILOSEC) 20 MG capsule TAKE 1 CAPSULE BY MOUTH EVERY DAY 90 capsule 1   Wheat Dextrin (BENEFIBER PO) Take 1 mg by mouth daily.     doxycycline (VIBRA-TABS) 100 MG tablet Take 1 tablet (100 mg total) by mouth 2 (two) times daily. 14 tablet 0   Melatonin 1 MG CAPS Take 1 capsule (1 mg total) by mouth at bedtime. 30 capsule 0   saccharomyces boulardii (  FLORASTOR) 250 MG capsule Take 250 mg by mouth daily.     No facility-administered medications prior to visit.   Review of Systems  Review of Systems  Constitutional:  Positive for fatigue.  HENT:  Positive for congestion.   Respiratory:  Positive for cough.     Physical Exam  BP 108/64 (BP Location: Left Arm, Cuff Size: Normal)   Pulse 90   Temp (!) 97.5 F (36.4 C) (Oral)   Ht '5\' 6"'$  (1.676 m)   Wt 95 lb 3.2 oz (43.2 kg)   SpO2 98%   BMI 15.37 kg/m  Physical Exam Constitutional:      Appearance: Normal appearance.  HENT:     Head: Normocephalic and atraumatic.     Mouth/Throat:     Mouth: Mucous membranes are moist.     Pharynx: Oropharynx is clear.  Cardiovascular:     Rate and Rhythm: Normal rate and regular rhythm.  Pulmonary:     Breath sounds: Rhonchi present.     Comments: Scattered rhonchi R>L, congested cough. No wheezing.  Musculoskeletal:        General: Normal range of motion.  Skin:    General: Skin is warm and dry.  Neurological:     General: No focal deficit present.     Mental Status: She is alert and oriented to person, place, and time. Mental status is  at baseline.  Psychiatric:        Mood and Affect: Mood normal.        Behavior: Behavior normal.        Thought Content: Thought content normal.        Judgment: Judgment normal.     Lab Results:  CBC    Component Value Date/Time   WBC 8.1 03/30/2021 1446   RBC 3.73 (L) 03/30/2021 1446   HGB 11.4 (L) 03/30/2021 1446   HGB 12.4 02/23/2018 1525   HCT 34.7 (L) 03/30/2021 1446   HCT 36.2 02/23/2018 1525   PLT 282.0 03/30/2021 1446   PLT 239 02/23/2018 1525   MCV 93.1 03/30/2021 1446   MCV 93 02/23/2018 1525   MCV 93 07/03/2012 1533   MCH 31.9 02/23/2018 1525   MCH 30.9 05/08/2015 0930   MCHC 32.7 03/30/2021 1446   RDW 15.3 03/30/2021 1446   RDW 12.4 02/23/2018 1525   RDW 13.1 07/03/2012 1533   LYMPHSABS 1.0 03/30/2021 1446   LYMPHSABS 1.7 02/23/2018 1525   LYMPHSABS 1.2 07/03/2012 1533   MONOABS 0.8 03/30/2021 1446   MONOABS 0.6 07/03/2012 1533   EOSABS 0.3 03/30/2021 1446   EOSABS 0.1 02/23/2018 1525   EOSABS 0.1 07/03/2012 1533   BASOSABS 0.0 03/30/2021 1446   BASOSABS 0.0 02/23/2018 1525   BASOSABS 0.1 07/03/2012 1533    BMET    Component Value Date/Time   NA 141 07/30/2020 1629   NA 142 02/23/2018 1525   K 4.3 07/30/2020 1629   CL 104 07/30/2020 1629   CO2 29 07/30/2020 1629   GLUCOSE 89 07/30/2020 1629   BUN 19 10/29/2020 1106   BUN 17 02/23/2018 1525   CREATININE 0.91 10/29/2020 1106   CALCIUM 9.0 07/30/2020 1629   GFRNONAA 61 02/23/2018 1525   GFRAA 70 02/23/2018 1525    BNP No results found for: BNP  ProBNP    Component Value Date/Time   PROBNP 162.0 (H) 03/30/2021 1446    Imaging: No results found.   Assessment & Plan:   Bronchiectasis ([place]) - Predominantly right middle lobe bronchiectasis, waxing  and waning tree-in-bed opacities and consolidative changes at the bases suggestive of MAI infection. Worsening cavitary lesions. It was felt patient would likely not tolerate bronchoscopy or treatment for MAI if confirmed d.t age - She keeps  a chronic cough with sputum production. Treated for acute exacerbtion with Doxycyline in early April. Recommend check sputum sample if able. Getting HRCT imaging. Encourage flutter valve and guaifenesin use. If dif getting up sputum can try hypertonic saline neb vs therapy vest. FU in 8-12 weeks with Dr. Vaughan Browner.   Recommendations: - Continue Robitusin-DM (guaifenesin-dextromethorphan) - Use flutter valve 5-10 breath three times a day  - Continue to wear 2L oxygen with exertion and at bedtime  - Follow aspiration precautions    Martyn Ehrich, NP 05/31/2021

## 2021-04-30 NOTE — Patient Instructions (Addendum)
Recommendations: ?- Continue children's claritin ?- Continue Robitusin-DM (guaifenesin-dextromethorphan) ?- Use flutter valve 5-10 breath three times a day  ?- Continue to wear 2L oxygen with exertion and at bedtime  ?- Follow aspiration precautions  ?- Look into getting clear ensure's  ?- Call or send mychart message if patient is still having difficulty getting mucus up and we can send in nebulizer  ? ?Referral: ?- Neurology re: dif word recall (ordered) ? ?Orders: ?- Flutter valve  ?- Sputum sample x 3 (ordered) ? ?Follow-up: ?- 8-12 weeks with Dr. Vaughan Browner  ? ? ?Aspiration Pneumonia, Adult ? ?Aspiration pneumonia is an infection that occurs after lung (pulmonary) aspiration. Pulmonary aspiration is when you inhale a large amount of food, liquid, stomach acid, or saliva into the lungs. This can cause inflammation and infection in the lungs. This can make you cough and make it hard to breathe. Aspiration pneumonia is a serious condition and can be life-threatening. ?What are the causes? ?This condition may be caused by: ?Bacteria in food, liquid, stomach acid, or saliva that is inhaled into the lung. ?Irritation and inflammation that results from material, such as blood or a foreign body, being inhaled into the lung. This can lead to an infection even though the material is not originally contaminated with bacteria. ?What increases the risk? ?You are more likely to get aspiration pneumonia if you have a condition that makes it hard to breathe, swallow, cough, or gag. These conditions may include: ?A breathing disorder, such as chronic obstructive pulmonary disease, that makes it hard to eat or drink while breathing. ?A brain (neurologic) disorder, such as stroke, seizures, Parkinson's disease, dementia, amyotrophic lateral sclerosis (ALS), or brain injury. ?Having gastroesophageal reflux disease (GERD). ?Having a weak disease-fighting system (immune system). ?Having a narrowing of the tube that carries food to the  stomach (esophageal narrowing). ?Other factors that may make you more likely to get aspiration pneumonia include: ?Being older than age 83 and frail. ?Being given a general anesthetic for procedures. ?Drinking too much alcohol and passing out. If you pass out and vomit, then vomit can be inhaled into your lungs. ?Taking certain medicines, such as tranquilizers or sedatives. ?Taking poor care of your mouth and teeth. ?Being malnourished. ?What are the signs or symptoms? ?The main symptom of pulmonary aspiration may be an episode of choking or coughing while eating or drinking. ?When aspiration pneumonia develops, symptoms include: ?Persistent cough. ?Difficulty breathing, such as wheezing or shortness of breath. ?Fever. ?Chest pain. ?Being more tired than usual (fatigue). ?Pulmonary aspiration may be silent, meaning that it is not associated with coughing or choking while eating or drinking. ?How is this diagnosed? ?This condition may be diagnosed based on: ?A physical exam. ?Tests, such as: ?A chest X-ray. ?A sputum culture. Saliva and mucus (sputum) are collected from the lungs or the tubes that carry air to the lungs (bronchi). The sputum is then tested for bacteria. ?Oximetry. A sensor or clip is placed on areas such as a finger, earlobe, or toe to measure the oxygen level in your blood. ?Blood tests. ?A swallowing study. This test looks at how food is swallowed and whether it goes into your windpipe (trachea) or esophagus. ?A bronchoscopy. This test uses a flexible tube (bronchoscope) to see inside the lungs. ?How is this treated? ?This condition may be treated with: ?Medicines. Antibiotic medicine will be given to kill the pneumonia bacteria. Other medicines may also be used to reduce fever, pain, or inflammation. ?Breathing assistance and oxygen therapy. Depending  on how well you are breathing, you may need to be given oxygen, or you may need breathing support from a breathing machine  (ventilator). ?Thoracentesis. This is a procedure to remove fluid that has built up in the space between the linings of the chest wall and the lungs. ?Dietary changes. You may need to avoid certain food textures or liquids. ?For people who have recurrent aspiration pneumonia, a feeding tube might be placed in the stomach for nutrition. ?Follow these instructions at home: ?Medicines ? ?Take over-the-counter and prescription medicines only as told by your health care provider. ?If you were prescribed an antibiotic medicine, take it as told by your health care provider. Do not stop taking the antibiotic even if you start to feel better. ?Take cough medicine only if you are losing sleep. Cough medicine can prevent your body's natural ability to remove mucus from your lungs. ?General instructions ? ?Carefully follow any eating instructions you were given, such as avoiding certain food textures or thickening your liquids. Thickening liquids reduces the risk of developing aspiration pneumonia again. ?Return to normal activities as told by your health care provider. Ask your health care provider what activities are safe for you. ?Sleep in a semi-upright position at night. Try to sleep in a reclining chair, or place a few pillows under your head in bed. ?Do not use any products that contain nicotine or tobacco, such as cigarettes, e-cigarettes, and chewing tobacco. If you need help quitting, ask your health care provider. ?Keep all follow-up visits as told by your health care provider. This is important. ?Contact a health care provider if you: ?Have a fever. ?Are coughing or choking while eating or drinking. ?Continue to have signs or symptoms of aspiration pneumonia. ?Get help right away if you have: ?Worsening shortness of breath, wheezing, or difficulty breathing. ?Chest pain. ?Summary ?Aspiration pneumonia is an infection that occurs after lung (pulmonary) aspiration. Pulmonary aspiration is when you inhale a large  amount of food, liquid, stomach acid, or saliva into the lungs. ?The main symptom of pulmonary aspiration may be an episode of choking or coughing. It may also be silent without coughing or choking. ?You are more likely to get aspiration pneumonia if you have a condition that makes it hard to breathe, swallow, cough, or gag. ?This information is not intended to replace advice given to you by your health care provider. Make sure you discuss any questions you have with your health care provider. ?Document Revised: 12/21/2018 Document Reviewed: 12/21/2018 ?Elsevier Patient Education ? [place]. ? ?

## 2021-05-02 LAB — CULTURE, URINE COMPREHENSIVE

## 2021-05-03 ENCOUNTER — Other Ambulatory Visit: Payer: Self-pay | Admitting: Internal Medicine

## 2021-05-04 ENCOUNTER — Ambulatory Visit: Payer: Medicare Other

## 2021-05-05 ENCOUNTER — Ambulatory Visit: Payer: Medicare Other | Admitting: Internal Medicine

## 2021-05-06 ENCOUNTER — Encounter: Payer: Self-pay | Admitting: Internal Medicine

## 2021-05-06 ENCOUNTER — Ambulatory Visit (INDEPENDENT_AMBULATORY_CARE_PROVIDER_SITE_OTHER): Payer: Medicare Other | Admitting: Internal Medicine

## 2021-05-06 VITALS — BP 142/64 | HR 59 | Temp 98.2°F | Ht 66.0 in | Wt 94.8 lb

## 2021-05-06 DIAGNOSIS — N390 Urinary tract infection, site not specified: Secondary | ICD-10-CM

## 2021-05-06 DIAGNOSIS — I10 Essential (primary) hypertension: Secondary | ICD-10-CM

## 2021-05-06 DIAGNOSIS — R64 Cachexia: Secondary | ICD-10-CM | POA: Diagnosis not present

## 2021-05-06 DIAGNOSIS — R339 Retention of urine, unspecified: Secondary | ICD-10-CM

## 2021-05-06 DIAGNOSIS — J479 Bronchiectasis, uncomplicated: Secondary | ICD-10-CM | POA: Diagnosis not present

## 2021-05-06 MED ORDER — LEVOFLOXACIN 25 MG/ML PO SOLN: 500.0000 mg | Freq: Every day | ORAL | 0 refills | Status: DC

## 2021-05-06 MED ORDER — MEGESTROL ACETATE 40 MG PO TABS: 40.0000 mg | ORAL_TABLET | Freq: Every day | ORAL | 1 refills | Status: DC

## 2021-05-06 NOTE — Patient Instructions (Signed)
Please take all new medication as prescribed  - the antibiotic solution, and megace for the appetite ? ?Please continue all other medications as before, and refills have been done if requested. ? ?Please have the pharmacy call with any other refills you may need. ? ?Please keep your appointments with your specialists as you may have planned - hematology ? ? ?

## 2021-05-06 NOTE — Progress Notes (Signed)
Patient ID: Julie Silva, female   DOB: 09/06/1927, 88 y.o.   MRN: [id number] ? ? ? ?    Chief Complaint: follow up UTI ? ?     HPI:  Julie Silva is a 88 y.o. female here after asked to come in per Dr Ronnald Ramp with recent abnormal Urine cx to determine treatment; pt accompanied by duaghter who is very good at relating her extensive and complicated remote and recent hx; pt herself is HOH and non toxic appearing but not confused and able to confirm; Denies urinary symptoms such as dysuria, frequency, urgency, flank pain, hematuria or n/v, fever, chills, but has recent urine cx + for Enterobacter that is partially resistant, mostly to cephalasporins, to which she has been taking cefdinir daily recently for prevention per ID.  Does I&O cath bid.  Pt also with hx of chronic hypoxic resp failure, recently 84% on RA with exertion, now to continue 2L home o2 with ambulation and sleep.  Pt may have chronic MAI infection as well - now s/p doxy course apr 11 after abnormal cxr.  Pt only taking in about 500 cal/day, and lost much wt.  Requires 24/7 supervisory care and ADL assist such as meals.  Lat UTI apr 25 + enterobacter.   ?      ?Wt Readings from Last 3 Encounters:  ?05/06/21 94 lb 12.8 oz (43 kg)  ?04/30/21 95 lb 3.2 oz (43.2 kg)  ?04/21/21 97 lb 12 oz (44.3 kg)  ? ?BP Readings from Last 3 Encounters:  ?05/06/21 (!) 142/64  ?04/30/21 108/64  ?04/23/21 138/69  ? ?      ?Past Medical History:  ?Diagnosis Date  ? Anemia   ? Aortic atherosclerosis ([place])   ? Arthritis   ? Osteoporosis, neck stiffness  ? Atrial fibrillation ([place])   ? Bleeds easily ([place])   ? "father was a hemophiliac" - pt not being bothered in recent years.  ? CAD (coronary artery disease)   ? Calcium deposit in bursa of knee 2017  ? Cancer [facility])   ? skin cancer "basal cell".  ? Chicken pox   ? Cyst, Baker's knee   ? left knee- tx. Cortisone injection 05-05-15 in office- "improved pain relief and swelling left ankle and knee"  ? Diverticulitis   ? Dysrhythmia   ?  Dr. McAlhany-cardiology  ? GERD (gastroesophageal reflux disease)   ? Glaucoma of both eyes   ? Gout   ? Hearing loss of both ears   ? Bilateral ears- hearing aids used- right ear hearing betther than left.  ? Hiatal hernia   ? History of blood transfusion 1957  ? "lots; most related to my periods"-last 1968 -s/p Hysterectomy  ? Migraines   ? "years ago" (02/04/2013)  ? Motion sickness   ? back seat - cars, sudden movements  ? PAF (paroxysmal atrial fibrillation) ([place])   ? Pancreatic cyst   ? Pelvic floor dysfunction   ? PONV (postoperative nausea and vomiting)   ? Risk for falls   ? "wobbley" per daughter  ? Self-catheterizes urinary bladder   ? "daily- retained urine and chronic UTI"  ? SVT (supraventricular tachycardia) ([place])   ? Dr. Darnell Level. [place]-follows "loop recorder left chest"  ? Swallowing difficulty   ? freq occ. mostley liquids  ? Urine incontinence   ? UTI (urinary tract infection)   ? Chronic Urinary tract infections- tx Macrobid daily.  ? Vertigo   ? Wears hearing aid in both ears   ? ?  Past Surgical History:  ?Procedure Laterality Date  ? APPENDECTOMY  01/03/1946  ? BLADDER SURGERY  01/03/1993  ? Repair-   ? BREAST BIOPSY Bilateral   ? "both were fine"  ? CATARACT EXTRACTION W/ INTRAOCULAR LENS IMPLANT Left   ? CATARACT EXTRACTION W/PHACO Right 10/16/2018  ? Procedure: CATARACT EXTRACTION PHACO AND INTRAOCULAR LENS PLACEMENT ([place])  RIGHT MiLoop diabetes 01:05.8  21.0%  13.77;  Surgeon: Birder Robson, MD;  Location: [facility];  Service: Ophthalmology;  Laterality: Right;  BS tends to drop in AM and would like early surgery so she can get home to eat, please  ? CHOLECYSTECTOMY  01/04/2012  ? COMBINED HYSTERECTOMY ABDOMINAL W/ A&P REPAIR / OOPHORECTOMY    ? DILATION AND CURETTAGE OF UTERUS    ? EP IMPLANTABLE DEVICE N/A 06/19/2014  ? Procedure: Loop Recorder Insertion;  Surgeon: Evans Lance, MD;  Location: [facility];  Service: Cardiovascular;  Laterality: N/A;  ? EXCISION OF BREAST  BIOPSY Right 05/13/2015  ? Procedure: RIGHT BREAST EXCISIONAL BIOPSY x2;  Surgeon: Armandina Gemma, MD;  Location: [facility];  Service: General;  Laterality: Right;  ? PACEMAKER IMPLANT  02/2020  ? TONSILLECTOMY  01/04/1944  ? TOTAL ABDOMINAL HYSTERECTOMY    ? VAGINAL HYSTERECTOMY  01/03/1966  ? vaginal mesh    ? ? reports that she has never smoked. She has never used smokeless tobacco. She reports that she does not drink alcohol and does not use drugs. ?family history includes Brain cancer in her sister; Breast cancer in her sister and another family member; CVA in her father; Cancer - Other in her brother, sister, sister, and another family member; Ovarian cancer in her mother; Stroke in her father. ?Allergies  ?Allergen Reactions  ? Codeine Other (See Comments)  ?  Passed out  ? Colchicine Diarrhea  ? ?Current Outpatient Medications on File Prior to Visit  ?Medication Sig Dispense Refill  ? acetaminophen (TYLENOL) 500 MG tablet Take 500 mg by mouth 2 (two) times daily.    ? amiodarone (PACERONE) 100 MG tablet Take 100 mg by mouth daily.    ? bimatoprost (LUMIGAN) 0.01 % SOLN Place 1 drop into both eyes at bedtime.    ? Catheters MISC Replace catheter twice daily. 180 each 1  ? cefdinir (OMNICEF) 250 MG/5ML suspension Take 250 mg by mouth daily.    ? Dextromethorphan-guaiFENesin (ROBITUSSIN COUGH+CHEST CONG DM) 5-100 MG/5ML LIQD     ? dorzolamide-timolol (COSOPT) 22.3-6.8 MG/ML ophthalmic solution Place 1 drop into both eyes 2 (two) times daily.     ? ELIQUIS 2.5 MG TABS tablet TAKE 1 TABLET TWICE A DAY 180 tablet 0  ? estradiol (ESTRACE) 0.1 MG/GM vaginal cream Place 1 Applicatorful vaginally at bedtime.    ? loratadine (CLARITIN) 5 MG/5ML syrup     ? mirtazapine (REMERON) 7.5 MG tablet Take 1 tablet (7.5 mg total) by mouth at bedtime. 90 tablet 1  ? Multiple Vitamins-Minerals (PRESERVISION AREDS) CAPS Take 1 tablet by mouth 2 (two) times daily.    ? omeprazole (PRILOSEC) 20 MG capsule TAKE 1 CAPSULE BY MOUTH EVERY DAY  90 capsule 1  ? Wheat Dextrin (BENEFIBER PO) Take 1 mg by mouth daily.    ? ?No current facility-administered medications on file prior to visit.  ? ?     ROS:  All others reviewed and negative. ? ?Objective  ? ?     PE:  BP (!) 142/64 (BP Location: Right Arm, Patient Position: Sitting, Cuff Size: Normal)  Pulse (!) 59   Temp 98.2 ?F (36.8 ?C) (Oral)   Ht '5\' 6"'$  (1.676 m)   Wt 94 lb 12.8 oz (43 kg)   SpO2 97%   BMI 15.30 kg/m?  ? ?              Constitutional: Pt appears in NAD ?              HENT: Head: NCAT.  ?              Right Ear: External ear normal.   ?              Left Ear: External ear normal.  ?              Eyes: . Pupils are equal, round, and reactive to light. Conjunctivae and EOM are normal ?              Nose: without d/c or deformity ?              Neck: Neck supple. Gross normal ROM ?              Cardiovascular: Normal rate and regular rhythm.   ?              Pulmonary/Chest: Effort normal and breath sounds without rales or wheezing.  ?              Abd:  Soft, NT, ND, + BS, no organomegaly ?              Neurological: Pt is alert. At baseline orientation, motor grossly intact ?              Skin: Skin is warm. No rashes, no other new lesions, LE edema - none ?              Psychiatric: Pt behavior is normal without agitation  ? ?Micro:   Apr 27 2021 ?Source: NOT GIVEN   ?Status: FINAL   ?Isolate 1: Enterobacter cloacae complex Abnormal    ?Comment: Greater than 100,000 CFU/mL of Enterobacter cloacae complex  ?[place]  ?  ? ?Susceptibility ? ? Enterobacter cloacae complex   ? CULT, URN, SPECIAL NEGATIVE 1   ? AMOX/CLAVULANIC >=32  Resistant   ? CEFAZOLIN >=64  Resistant 1   ? CEFEPIME 2  Sensitive   ? CEFTAZIDIME >=64  Resistant   ? CEFTRIAXONE >=64  Resistant   ? CIPROFLOXACIN <=0.25  Sensitive   ? GENTAMICIN <=1  Sensitive   ? LEVOFLOXACIN 0.5  Sensitive   ? NITROFURANTOIN 64  Intermediate   ? PIP/TAZO >=128  Resistant   ? TRIMETH/SULFA <=20   Sensitive 2   ?  ?  ?  ? ? ?Cardiac tracings I have personally interpreted today:  none ? ?Pertinent Radiological findings (summarize): none  ? ?Lab Results  ?Component Value Date  ? WBC 8.1 03/30/2021  ? HGB 11.4 (L

## 2021-05-10 ENCOUNTER — Ambulatory Visit: Payer: Medicare Other | Admitting: Internal Medicine

## 2021-05-11 ENCOUNTER — Inpatient Hospital Stay: Payer: Medicare Other

## 2021-05-12 ENCOUNTER — Encounter: Payer: Self-pay | Admitting: Internal Medicine

## 2021-05-12 DIAGNOSIS — N390 Urinary tract infection, site not specified: Secondary | ICD-10-CM | POA: Insufficient documentation

## 2021-05-12 NOTE — Assessment & Plan Note (Signed)
Pt to continue I&O cath bid ?

## 2021-05-12 NOTE — Assessment & Plan Note (Signed)
BP Readings from Last 3 Encounters:  ?05/06/21 (!) 142/64  ?04/30/21 108/64  ?04/23/21 138/69  ? ?Mild uncontrolled, likely situational, pt to continue medical treatment  - diet, low salt ? ?

## 2021-05-12 NOTE — Assessment & Plan Note (Signed)
Most recet + for enterobacter sens to levaquiin - to start today, then restart cefdinir after for preventive ?

## 2021-05-12 NOTE — Assessment & Plan Note (Signed)
With recent flare s/p doxy course,  to f/u any worsening symptoms or concerns ?

## 2021-05-12 NOTE — Assessment & Plan Note (Signed)
Persistent worsening per daughter, ok for trial megace ?

## 2021-05-17 DIAGNOSIS — J479 Bronchiectasis, uncomplicated: Secondary | ICD-10-CM

## 2021-05-17 NOTE — Telephone Encounter (Signed)
I have placed an order. Can we order hypertonic saline nebulizer's for her if she does not already have. Please see if they need DME order and RX placed. It would be twice a day for bronchiectasis. She should be seeing primary care as well for other symptoms of weakness and decreased appetite

## 2021-05-17 NOTE — Telephone Encounter (Signed)
[name] please advise on the following My Chart message:  ? ?Solana S Fei  P [facility] (supporting Martyn Ehrich, NP) 4 hours ago (10:38 AM)  ? ?Hi [name],  ?I'm writing to request we move forward with scheduling the lung CT. Mom wants answers as to why she's so weak and not improving. Weakness, extreme fatigue, and complete loss of appetite top the list of her personal concerns at the moment.  ?  ?She can't produce a sputum sample using the flutter valve.  She's still using 2L Oxygen at night only and maintains mid 90's during the day on room air.  Today she completed a 10-day course of Levofloxacin for a resistant UTI.  ?  ?Our goal is to get some answers so we can plan for the future and provide the best possible quality of life.  ?  ?Sincere thanks to you for helping [place] manage her care.  ?Rosiland Oz  ? ?Thank you  ?

## 2021-05-18 ENCOUNTER — Other Ambulatory Visit: Payer: Self-pay | Admitting: Internal Medicine

## 2021-05-18 ENCOUNTER — Encounter: Payer: Self-pay | Admitting: Oncology

## 2021-05-18 DIAGNOSIS — K2100 Gastro-esophageal reflux disease with esophagitis, without bleeding: Secondary | ICD-10-CM

## 2021-05-19 ENCOUNTER — Ambulatory Visit: Payer: Medicare Other | Admitting: Internal Medicine

## 2021-05-19 MED ORDER — SODIUM CHLORIDE 3 % IN NEBU: INHALATION_SOLUTION | Freq: Two times a day (BID) | RESPIRATORY_TRACT | 12 refills | Status: DC

## 2021-05-20 ENCOUNTER — Encounter: Payer: Self-pay | Admitting: Internal Medicine

## 2021-05-21 ENCOUNTER — Inpatient Hospital Stay: Payer: Medicare Other

## 2021-05-21 NOTE — Telephone Encounter (Signed)
Have her try mucinex-dm '1200mg'$  twice daily if not already taking. Has she received nebulizer/hypertonic saline?

## 2021-05-31 NOTE — Assessment & Plan Note (Addendum)
-    Patient has daily productive cough lasting >12 months requiring antibiotic therapy. Treated for acute exacerbtion with Doxycyline in early April 2023. Recommend check sputum sample if able.  Considered and ruled out manual CPT is not consistent skilled caregiver available to perform therapy. CT scan performed on 03/19/2020 confirms bronchiectasis. Predominantly right middle lobe bronchiectasis, waxing and waning tree-in-bed opacities and consolidative changes at the bases suggestive of MAI infection. Worsening cavitary lesions. It was felt patient would likely not tolerate bronchoscopy or treatment for MAI if confirmed d.t age. Getting repeat HRCT imaging to assess for progression/worsening of disease. Flutter valve and mucinex have been tried but have not effectively mobilized secretions. Starting patient on vest therapy.  Recommendations: - Continue Robitusin-DM (guaifenesin-dextromethorphan) - Use flutter valve 5-10 breath three times a day  - Continue to wear 2L oxygen with exertion and at bedtime  - Follow aspiration precautions  - Vest ordered

## 2021-06-03 DIAGNOSIS — N390 Urinary tract infection, site not specified: Secondary | ICD-10-CM | POA: Diagnosis not present

## 2021-06-03 DIAGNOSIS — J471 Bronchiectasis with (acute) exacerbation: Secondary | ICD-10-CM | POA: Diagnosis not present

## 2021-06-05 ENCOUNTER — Other Ambulatory Visit: Payer: Self-pay | Admitting: Internal Medicine

## 2021-06-05 DIAGNOSIS — K2100 Gastro-esophageal reflux disease with esophagitis, without bleeding: Secondary | ICD-10-CM

## 2021-06-07 ENCOUNTER — Ambulatory Visit (INDEPENDENT_AMBULATORY_CARE_PROVIDER_SITE_OTHER): Payer: Medicare Other | Admitting: Neurology

## 2021-06-07 ENCOUNTER — Encounter: Payer: Self-pay | Admitting: Neurology

## 2021-06-07 VITALS — BP 157/68 | HR 94 | Ht 60.0 in | Wt 94.0 lb

## 2021-06-07 DIAGNOSIS — R634 Abnormal weight loss: Secondary | ICD-10-CM

## 2021-06-07 DIAGNOSIS — E46 Unspecified protein-calorie malnutrition: Secondary | ICD-10-CM | POA: Diagnosis not present

## 2021-06-07 DIAGNOSIS — F02A0 Dementia in other diseases classified elsewhere, mild, without behavioral disturbance, psychotic disturbance, mood disturbance, and anxiety: Secondary | ICD-10-CM

## 2021-06-07 DIAGNOSIS — G301 Alzheimer's disease with late onset: Secondary | ICD-10-CM | POA: Diagnosis not present

## 2021-06-07 DIAGNOSIS — R630 Anorexia: Secondary | ICD-10-CM | POA: Diagnosis not present

## 2021-06-07 NOTE — Progress Notes (Signed)
[facility]  PATIENT: Julie Silva DOB: July 18, 1927  REQUESTING CLINICIAN: Martyn Ehrich, NP HISTORY FROM: Daughter  REASON FOR VISIT: Poor appetite, poor sleep, memory deficit, r/o dementia    HISTORICAL  CHIEF COMPLAINT:  Chief Complaint  Patient presents with   New Patient (Initial Visit)    Room 13, alone NP Internal referral for word finding difficulty, dementia eval Daughter reports vivid dreams, talking in sleep, sleep working , lake of appetite  states sx started 2-3 months ago, Reports frequent UTIs Moca 16    HISTORY OF PRESENT ILLNESS:  This is a 88 year old woman past medical history of atrial fibrillation, pelvic floor dysfunction with self-catheterization, history of multiple UTIs, history of poor oral intake and malnutrition, hearing loss, who is presenting for overall decline in the past year but worse in the past few months, and poor sleep.   In brief daughter reports patient have a history of multiple UTIs, history of poor p.o. intake and weight loss for many years.  Her symptoms have been getting worse for the past year and a half and acutely worse in the past 3 to 4 months after being admitted in the hospital for urinary tract infection.  Daughter reported that in February patient has rapid A-fib, which was controlled with amiodarone.  At that time also she was found to have a resistant UTI, admitted to hospital for IV antibiotic and discharged on cefdinir.  From there she also had lung infection, bronchiectasis and since discharge from the hospital her appetite has been very poor.  She is also very weak, a year ago she used to walk independently but now needs wheelchair or at least a walker.  During this time also she was noted to need supplemental oxygen, but her O2 requirement has lessened.  She still have the supplemental oxygen but she only needed as needed. In May, she also had another bout of UTI.   Daughter reports that her sleep is  poor, she goes to bed around 9/10 PM but will be restless at night, will toss and turn, sometimes will remove her oxygen machine.   She is also talking during her sleep and act out her dreams.  Some nights she will get up trying to leave the bedroom or some night, she does not know how to get to her own bedroom, thinking that the bedroom is upstairs where in fact there are no stairs in the house.  Due to her worsening dream, her mirtazapine was discontinued due to possible side effect REM sleep abnormal behavior.  Currently she is not taking any appetite stimulant, in the past she tried mirtazapine and Megace. She was on mirtazapine for the past 4 years but lost of appetite is getting worse.   She did also try supplement like boost but currently not using it.    In terms of her memory, there is also concern of memory loss.  Patient lives with her 2 daughters who help assist her in her activities of daily living.  She currently is not driving, has not driven for the past 2 years.  She needs help with cooking, cleaning, bathing and toileting and also daughter is helping her bills.  She does not have any issue going to familiar places or difficult with names of family members. She also feels like her mood is low.     TBI:   No past history of TBI Stroke:    no past history of stroke Seizures:   no past  history of seizures Sleep:   no history of sleep apnea.  Mood:   patient denies anxiety and depression  Functional status: Dependent in most ADLs and IADLs Patient lives with daughter.  Cooking: No Cleaning: No Shopping: No  Bathing: needs help  Toileting:  Patient  Driving: Not driving for the past 2 year  Bills: Daughter   Ever left the stove on by accident?: No Forget how to use items around the house?: No  Getting lost going to familiar places?: No  Forgetting loved ones names?: No  Word finding difficulty? Yes  Sleep: Poor, will get restless    OTHER MEDICAL CONDITIONS: recurrent  UTIs, weight loss, Bronchiectasis, atrial fibrillation    REVIEW OF SYSTEMS: Full 14 system review of systems performed and negative with exception of: as noted in the HPI   ALLERGIES: Allergies  Allergen Reactions   Codeine Other (See Comments)    Passed out   Colchicine Diarrhea    HOME MEDICATIONS: Outpatient Medications Prior to Visit  Medication Sig Dispense Refill   acetaminophen (TYLENOL) 500 MG tablet Take 500 mg by mouth 2 (two) times daily.     amiodarone (PACERONE) 100 MG tablet Take 100 mg by mouth daily.     bimatoprost (LUMIGAN) 0.01 % SOLN Place 1 drop into both eyes at bedtime.     Catheters MISC Replace catheter twice daily. 180 each 1   cefdinir (OMNICEF) 250 MG/5ML suspension Take 250 mg by mouth daily.     dorzolamide-timolol (COSOPT) 22.3-6.8 MG/ML ophthalmic solution Place 1 drop into both eyes 2 (two) times daily.      ELIQUIS 2.5 MG TABS tablet TAKE 1 TABLET TWICE A DAY 180 tablet 0   estradiol (ESTRACE) 0.1 MG/GM vaginal cream Place 1 Applicatorful vaginally at bedtime.     megestrol (MEGACE) 40 MG tablet Take 1 tablet (40 mg total) by mouth daily. 90 tablet 1   Multiple Vitamins-Minerals (PRESERVISION AREDS) CAPS Take 1 tablet by mouth 2 (two) times daily.     omeprazole (PRILOSEC) 20 MG capsule TAKE 1 CAPSULE BY MOUTH EVERY DAY 90 capsule 1   Wheat Dextrin (BENEFIBER PO) Take 1 mg by mouth daily.     Dextromethorphan-guaiFENesin (ROBITUSSIN COUGH+CHEST CONG DM) 5-100 MG/5ML LIQD      levofloxacin (LEVAQUIN) 25 MG/ML solution Take 20 mLs (500 mg total) by mouth daily. Ok to take with amiodarone 200 mL 0   loratadine (CLARITIN) 5 MG/5ML syrup      mirtazapine (REMERON) 7.5 MG tablet Take 1 tablet (7.5 mg total) by mouth at bedtime. 90 tablet 1   sodium chloride HYPERTONIC 3 % nebulizer solution Take by nebulization in the morning and at bedtime. 750 mL 12   No facility-administered medications prior to visit.    PAST MEDICAL HISTORY: Past Medical  History:  Diagnosis Date   Anemia    Aortic atherosclerosis (HCC)    Arthritis    Osteoporosis, neck stiffness   Atrial fibrillation ([place])    Bleeds easily [facility])    "father was a hemophiliac" - pt not being bothered in recent years.   CAD (coronary artery disease)    Calcium deposit in bursa of knee 2017   Cancer ([place])    skin cancer "basal cell".   Chicken pox    Cyst, Baker's knee    left knee- tx. Cortisone injection 05-05-15 in office- "improved pain relief and swelling left ankle and knee"   Diverticulitis    Dysrhythmia    Dr. McAlhany-cardiology  GERD (gastroesophageal reflux disease)    Glaucoma of both eyes    Gout    Hearing loss of both ears    Bilateral ears- hearing aids used- right ear hearing betther than left.   Hiatal hernia    History of blood transfusion 1957   "lots; most related to my periods"-last 1968 -s/p Hysterectomy   Migraines    "years ago" (02/04/2013)   Motion sickness    back seat - cars, sudden movements   PAF (paroxysmal atrial fibrillation) (HCC)    Pancreatic cyst    Pelvic floor dysfunction    PONV (postoperative nausea and vomiting)    Risk for falls    "wobbley" per daughter   Self-catheterizes urinary bladder    "daily- retained urine and chronic UTI"   SVT (supraventricular tachycardia) ([place])    Dr. Darnell Level. Taylor-follows "loop recorder left chest"   Swallowing difficulty    freq occ. mostley liquids   Urine incontinence    UTI (urinary tract infection)    Chronic Urinary tract infections- tx Macrobid daily.   Vertigo    Wears hearing aid in both ears     PAST SURGICAL HISTORY: Past Surgical History:  Procedure Laterality Date   APPENDECTOMY  01/03/1946   BLADDER SURGERY  01/03/1993   Repair-    BREAST BIOPSY Bilateral    "both were fine"   CATARACT EXTRACTION W/ INTRAOCULAR LENS IMPLANT Left    CATARACT EXTRACTION W/PHACO Right 10/16/2018   Procedure: CATARACT EXTRACTION PHACO AND INTRAOCULAR LENS PLACEMENT ([place])  RIGHT  MiLoop diabetes 01:05.8  21.0%  13.77;  Surgeon: Birder Robson, MD;  Location: [facility];  Service: Ophthalmology;  Laterality: Right;  BS tends to drop in AM and would like early surgery so she can get home to eat, please   CHOLECYSTECTOMY  01/04/2012   COMBINED HYSTERECTOMY ABDOMINAL W/ A&P REPAIR / OOPHORECTOMY     DILATION AND CURETTAGE OF UTERUS     EP IMPLANTABLE DEVICE N/A 06/19/2014   Procedure: Loop Recorder Insertion;  Surgeon: Evans Lance, MD;  Location: [facility];  Service: Cardiovascular;  Laterality: N/A;   EXCISION OF BREAST BIOPSY Right 05/13/2015   Procedure: RIGHT BREAST EXCISIONAL BIOPSY x2;  Surgeon: Armandina Gemma, MD;  Location: [facility];  Service: General;  Laterality: Right;   PACEMAKER IMPLANT  02/2020   TONSILLECTOMY  01/04/1944   TOTAL ABDOMINAL HYSTERECTOMY     VAGINAL HYSTERECTOMY  01/03/1966   vaginal mesh      FAMILY HISTORY: Family History  Problem Relation Age of Onset   Stroke Father    CVA Father    Ovarian cancer Mother    Cancer - Other Sister        Breast   Breast cancer Sister    Cancer - Other Brother        Throat   Cancer - Other Sister        Throat   Brain cancer Sister    Cancer - Other Other        Brain   Breast cancer Other     SOCIAL HISTORY: Social History   Socioeconomic History   Marital status: Widowed    Spouse name: Not on file   Number of children: 4   Years of education: 19   Highest education level: Not on file  Occupational History   Occupation: Runner, broadcasting/film/video  Tobacco Use   Smoking status: Never   Smokeless tobacco: Never  Vaping Use   Vaping  Use: Never used  Substance and Sexual Activity   Alcohol use: No    Alcohol/week: 0.0 standard drinks   Drug use: No   Sexual activity: Never  Other Topics Concern   Not on file  Social History Narrative   Lives alone in a one story home.  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    Social Determinants of Health   Financial  Resource Strain: Not on file  Food Insecurity: Not on file  Transportation Needs: Not on file  Physical Activity: Not on file  Stress: Not on file  Social Connections: Not on file  Intimate Partner Violence: Not on file    PHYSICAL EXAM  GENERAL EXAM/CONSTITUTIONAL: Vitals:  Vitals:   06/07/21 1520  BP: (!) 157/68  Pulse: 94  Weight: 94 lb (42.6 kg)  Height: 5' (1.524 m)   Body mass index is 18.36 kg/m. Wt Readings from Last 3 Encounters:  06/07/21 94 lb (42.6 kg)  05/06/21 94 lb 12.8 oz (43 kg)  04/30/21 95 lb 3.2 oz (43.2 kg)   Patient is in no distress; well developed, nourished and groomed; neck is supple  EYES: Pupils round and reactive to light, Visual fields full to confrontation, Extraocular movements intacts,   MUSCULOSKELETAL: Gait, strength, tone, movements noted in Neurologic exam below  NEUROLOGIC: MENTAL STATUS:     10/26/2017    3:27 PM  MMSE - Mini Mental State Exam  Not completed: Refused      06/07/2021    3:31 PM  Montreal Cognitive Assessment   Visuospatial/ Executive (0/5) 2  Naming (0/3) 2  Attention: Read list of digits (0/2) 2  Attention: Read list of letters (0/1) 0  Attention: Serial 7 subtraction starting at 100 (0/3) 0  Language: Repeat phrase (0/2) 2  Language : Fluency (0/1) 0  Abstraction (0/2) 2  Delayed Recall (0/5) 0  Orientation (0/6) 6  Total 16  Able to spell PENCIL forward and backward  Very flat and blunted affect  CRANIAL NERVE:  2nd, 3rd, 4th, 6th - pupils equal and reactive to light, visual fields full to confrontation, extraocular muscles intact, no nystagmus 5th - facial sensation symmetric 7th - facial strength symmetric 8th - hearing intact 9th - palate elevates symmetrically, uvula midline 11th - shoulder shrug symmetric 12th - tongue protrusion midline  MOTOR:  Decrease bulk, full strength in the BUE, BLE  SENSORY:  normal and symmetric to light touch  COORDINATION:  finger-nose-finger, fine  finger movements normal  REFLEXES:  deep tendon reflexes present and symmetric  GAIT/STATION:  In a wheelchair    DIAGNOSTIC DATA (LABS, IMAGING, TESTING) - I reviewed patient records, labs, notes, testing and imaging myself where available.  Lab Results  Component Value Date   WBC 8.1 03/30/2021   HGB 11.4 (L) 03/30/2021   HCT 34.7 (L) 03/30/2021   MCV 93.1 03/30/2021   PLT 282.0 03/30/2021      Component Value Date/Time   NA 141 07/30/2020 1629   NA 142 02/23/2018 1525   K 4.3 07/30/2020 1629   CL 104 07/30/2020 1629   CO2 29 07/30/2020 1629   GLUCOSE 89 07/30/2020 1629   BUN 19 10/29/2020 1106   BUN 17 02/23/2018 1525   CREATININE 0.91 10/29/2020 1106   CALCIUM 9.0 07/30/2020 1629   PROT 6.8 07/30/2020 1629   ALBUMIN 3.7 07/30/2020 1629   AST 19 07/30/2020 1629   ALT 9 07/30/2020 1629   ALKPHOS 71 07/30/2020 1629   BILITOT 0.5 07/30/2020 1629  GFRNONAA 61 02/23/2018 1525   GFRAA 70 02/23/2018 1525   Lab Results  Component Value Date   CHOL 168 04/04/2016   HDL 58.10 04/04/2016   LDLCALC 89 04/04/2016   TRIG 105.0 04/04/2016   CHOLHDL 3 04/04/2016   Lab Results  Component Value Date   HGBA1C 5.5 05/07/2013   Lab Results  Component Value Date   VITAMINB12 905 03/30/2021   Lab Results  Component Value Date   TSH 4.81 03/30/2021       ASSESSMENT AND PLAN  88 y.o. year old female with multiple medical conditions including atrial fibrillation, multiple UTIs, history of pelvic floor dysfunction requiring self-catheterization, poor oral Intake who is presenting with complaint of poor oral intake, weight loss, sleep difficulty and memory problem.  Complaint of weight loss and poor pot intake seems to be an ongoing situation for many years but acutely worse after her recent hospitalization for UTI and lung infection in February requiring IV antibiotics.  Since then, her appetite is worse, sleep is also worse and episode of confusion are very frequent.  She  also had another bout of UTI in May.  On exam today she appears withdrawn with a blunted flat affect.  She reported her mood is low.  I am concerned that patient has an episode of depression, Due to that I will refer her to the geriatric psychiatrist Dr.Plovsky.  I will also refer to patient to geriatrician to look over her case and see if additional medication additional supplement stimulant are needed.  In terms of her memory, she scored a 16 out of 30 on the Moca.  She also need assistance in most activities of daily living.  I do believe the patient have mild dementia, likely Alzheimer's type.  Daughter was worried about Lewy body dementia due to the REM sleep behavior but there are no report of hallucinations and no parkinsonism.  Due to ongoing issue, possibly depression and poor sleep and poor appetite we have opted not to start medication for dementia.  Plan will be for patient to follow-up with psychiatry, geriatry and primary care to have a plan regarding her poor p.o. intake, and possible depression.  Per daughter, She is not interested in a PEG tube.  Follow-up in 6 months   1. Mild late onset Alzheimer's dementia without behavioral disturbance, psychotic disturbance, mood disturbance, or anxiety (HCC)   2. Caloric malnutrition ([place])   3. Weight loss   4. Poor appetite      Patient Instructions  Continue current medications  Trial of melatonin for sleep aid Referral to geriatric Referral to geriatric psychiatry for evaluation of depression Follow-up in 64-month or sooner if worse  No orders of the defined types were placed in this encounter.   No orders of the defined types were placed in this encounter.   Return in about 6 months (around 12/07/2021).  I have spent a total of 65 minutes dedicated to this patient today, preparing to see patient, performing a medically appropriate examination and evaluation, ordering tests and/or medications and procedures, and counseling and  educating the patient/family/caregiver; independently interpreting result and communicating results to the family/patient/caregiver; and documenting clinical information in the electronic medical record.   AAlric Ran MD 06/07/2021, 8:58 PM  [facility] [address] [place] [phone]

## 2021-06-07 NOTE — Patient Instructions (Signed)
Continue current medications  Trial of melatonin for sleep aid Referral to geriatric Referral to geriatric psychiatry for evaluation of depression Follow-up in 76-month or sooner if worse

## 2021-06-08 ENCOUNTER — Telehealth: Payer: Self-pay | Admitting: Neurology

## 2021-06-08 NOTE — Telephone Encounter (Signed)
Referral for Geriatrics sent to [facility] [phone].

## 2021-06-10 ENCOUNTER — Ambulatory Visit
Admission: RE | Admit: 2021-06-10 | Discharge: 2021-06-10 | Disposition: A | Discharge status: Home / Self Care | Payer: Medicare Other | Source: Ambulatory Visit | Attending: Primary Care | Admitting: Primary Care

## 2021-06-10 DIAGNOSIS — J479 Bronchiectasis, uncomplicated: Secondary | ICD-10-CM | POA: Diagnosis not present

## 2021-06-10 DIAGNOSIS — J90 Pleural effusion, not elsewhere classified: Secondary | ICD-10-CM | POA: Diagnosis not present

## 2021-06-10 DIAGNOSIS — J471 Bronchiectasis with (acute) exacerbation: Secondary | ICD-10-CM

## 2021-06-10 DIAGNOSIS — J8410 Pulmonary fibrosis, unspecified: Secondary | ICD-10-CM | POA: Diagnosis not present

## 2021-06-10 DIAGNOSIS — I700 Atherosclerosis of aorta: Secondary | ICD-10-CM | POA: Diagnosis not present

## 2021-06-14 DIAGNOSIS — J471 Bronchiectasis with (acute) exacerbation: Secondary | ICD-10-CM

## 2021-06-14 NOTE — Telephone Encounter (Signed)
I do not see anything about a 3% nebulizer solution. Please advise if ok to send in 3% saline solution?

## 2021-06-15 NOTE — Progress Notes (Signed)
Needs visit with Dr. Vaughan Browner, he has some openings end of June. I sent you a mychart message back about this. Please ask if they have received therapy vest, if not we can send in hypertonic saline 3% to use twice daily as needed to loosen congestion

## 2021-06-15 NOTE — Telephone Encounter (Signed)
Please get patient an apt with Dr. Vaughan Browner, ok to wait 1-2 weeks. Sputum sample will be very helpful if able. I ordered her a therapy vest have they received this. If not please sent in hypertonic saline neb 3% twice a day as needed to loosen congestion. Some pharmacies are not carrying this however, may need to send to dif pharm

## 2021-06-15 NOTE — Telephone Encounter (Signed)
Mychart message sent by pt's daughter: Lonni Fix [facility] (supporting Martyn Ehrich, NP) 16 hours ago (4:25 PM)    Hi Benjamine Mola,  Do we need to schedule an appointment regarding the CT scan or would a phone call work? We've not been able to get a sputum sample yet.  Thanks,  Juanda Crumble, please advise.

## 2021-06-15 NOTE — Telephone Encounter (Signed)
Sent Julie Silva a message back regarding a previous message and addressed this there

## 2021-06-16 MED ORDER — SODIUM CHLORIDE 3 % IN NEBU: INHALATION_SOLUTION | Freq: Two times a day (BID) | RESPIRATORY_TRACT | 12 refills | Status: DC | PRN

## 2021-06-16 MED ORDER — SODIUM CHLORIDE 3 % IN NEBU: INHALATION_SOLUTION | Freq: Two times a day (BID) | RESPIRATORY_TRACT | 12 refills | Status: DC

## 2021-06-16 NOTE — Telephone Encounter (Signed)
I replaced order for vest and put urgent. It can cause weight loss. Concern for failure to thrive, I would have her see PCP or may need to go to ED for evaluation. Would recommend family speak with Dr. Vaughan Browner about palliative care at visit if not already established.

## 2021-06-16 NOTE — Telephone Encounter (Signed)
Called and spoke with [name] who stated that pt has not received the therapy vest yet. Stated to her that we were sending Rx for pt's neb sol to [facility] and I provided the phone number to her for [facility] as well. Also scheduled a follow up appt for pt to see Dr. Vaughan Browner on 6/27 as Julie Silva stated afternoons works better for pt.  While speaking with [name] said that pt has had an extreme loss of appetite and states on a good day pt is only getting about 500 calories intake. Julie Silva wants to know if the bronchiectasis might be a cause to why pt has no appetite. [name], please advise.

## 2021-06-19 ENCOUNTER — Other Ambulatory Visit: Payer: Self-pay | Admitting: Internal Medicine

## 2021-06-22 ENCOUNTER — Encounter: Payer: Self-pay | Admitting: Internal Medicine

## 2021-06-22 ENCOUNTER — Telehealth: Payer: Self-pay | Admitting: Primary Care

## 2021-06-22 ENCOUNTER — Ambulatory Visit (INDEPENDENT_AMBULATORY_CARE_PROVIDER_SITE_OTHER): Payer: Medicare Other | Admitting: Internal Medicine

## 2021-06-22 ENCOUNTER — Telehealth: Payer: Self-pay | Admitting: *Deleted

## 2021-06-22 VITALS — BP 134/74 | HR 77 | Temp 98.0°F | Ht 60.0 in | Wt 90.0 lb

## 2021-06-22 DIAGNOSIS — I10 Essential (primary) hypertension: Secondary | ICD-10-CM | POA: Diagnosis not present

## 2021-06-22 DIAGNOSIS — L89151 Pressure ulcer of sacral region, stage 1: Secondary | ICD-10-CM | POA: Insufficient documentation

## 2021-06-22 DIAGNOSIS — J449 Chronic obstructive pulmonary disease, unspecified: Secondary | ICD-10-CM

## 2021-06-22 DIAGNOSIS — R64 Cachexia: Secondary | ICD-10-CM | POA: Diagnosis not present

## 2021-06-22 MED ORDER — DRONABINOL 2.5 MG PO CAPS: 2.5000 mg | ORAL_CAPSULE | Freq: Two times a day (BID) | ORAL | 0 refills | Status: DC

## 2021-06-22 NOTE — Telephone Encounter (Signed)
Called Julie Silva but she did not answer. Left message for her to call us back. [name] is out of the office until tomorrow.

## 2021-06-22 NOTE — Telephone Encounter (Signed)
Pt was on cover-my-meds need PA for dronabinol (MARINOL) 2.5 MG capsule. Submitted w/ (Key: [id number]). Rec'd msg " Your information has been submitted to [place] [employer] Part D. [employer] Part D will review the request and will issue a decision, typically within 1-3 days"

## 2021-06-22 NOTE — Telephone Encounter (Signed)
Rec'd determinatin fax med was DENIED. It states 'This request has received an Unfavorable outcome. We denied this request under Medicare Part D because: The information provided by your prescriber did not meet the requirements for covering this medication (prior authorization).././lb

## 2021-06-22 NOTE — Progress Notes (Unsigned)
Subjective:  Patient ID: Julie Silva, female    DOB: 26-Apr-1927  Age: 88 y.o. MRN: [id number]  CC: No chief complaint on file.   HPI Julie Silva presents for ***  Outpatient Medications Prior to Visit  Medication Sig Dispense Refill   acetaminophen (TYLENOL) 500 MG tablet Take 500 mg by mouth 2 (two) times daily.     amiodarone (PACERONE) 100 MG tablet Take 100 mg by mouth daily.     bimatoprost (LUMIGAN) 0.01 % SOLN Place 1 drop into both eyes at bedtime.     Catheters MISC Replace catheter twice daily. 180 each 1   cefdinir (OMNICEF) 250 MG/5ML suspension Take 250 mg by mouth daily.     dorzolamide-timolol (COSOPT) 22.3-6.8 MG/ML ophthalmic solution Place 1 drop into both eyes 2 (two) times daily.      ELIQUIS 2.5 MG TABS tablet TAKE 1 TABLET TWICE A DAY 180 tablet 0   estradiol (ESTRACE) 0.1 MG/GM vaginal cream Place 1 Applicatorful vaginally at bedtime.     megestrol (MEGACE) 40 MG tablet Take 1 tablet (40 mg total) by mouth daily. 90 tablet 1   omeprazole (PRILOSEC) 20 MG capsule TAKE 1 CAPSULE BY MOUTH EVERY DAY 90 capsule 1   sodium chloride HYPERTONIC 3 % nebulizer solution Take by nebulization 2 (two) times daily as needed for other. 750 mL 12   Wheat Dextrin (BENEFIBER PO) Take 1 mg by mouth daily.     Multiple Vitamins-Minerals (PRESERVISION AREDS) CAPS Take 1 tablet by mouth 2 (two) times daily.     No facility-administered medications prior to visit.    ROS Review of Systems  Objective:  BP 134/74 (BP Location: Right Arm, Patient Position: Sitting, Cuff Size: Normal)   Pulse 77   Temp 98 F (36.7 C) (Oral)   Ht 5' (1.524 m)   Wt 90 lb (40.8 kg)   SpO2 93%   BMI 17.58 kg/m   BP Readings from Last 3 Encounters:  06/22/21 134/74  06/07/21 (!) 157/68  05/06/21 (!) 142/64    Wt Readings from Last 3 Encounters:  06/22/21 90 lb (40.8 kg)  06/07/21 94 lb (42.6 kg)  05/06/21 94 lb 12.8 oz (43 kg)    Physical Exam  Lab Results  Component Value Date    WBC 8.1 03/30/2021   HGB 11.4 (L) 03/30/2021   HCT 34.7 (L) 03/30/2021   PLT 282.0 03/30/2021   GLUCOSE 89 07/30/2020   CHOL 168 04/04/2016   TRIG 105.0 04/04/2016   HDL 58.10 04/04/2016   LDLCALC 89 04/04/2016   ALT 9 07/30/2020   AST 19 07/30/2020   NA 141 07/30/2020   K 4.3 07/30/2020   CL 104 07/30/2020   CREATININE 0.91 10/29/2020   BUN 19 10/29/2020   CO2 29 07/30/2020   TSH 4.81 03/30/2021   INR 0.9 07/03/2012   HGBA1C 5.5 05/07/2013    CT CHEST HIGH RESOLUTION  Result Date: 06/12/2021 CLINICAL DATA:  Interstitial lung disease, MAI, bronchiectasis EXAM: CT CHEST WITHOUT CONTRAST TECHNIQUE: Multidetector CT imaging of the chest was performed following the standard protocol without intravenous contrast. High resolution imaging of the lungs, as well as inspiratory and expiratory imaging, was performed. RADIATION DOSE REDUCTION: This exam was performed according to the departmental dose-optimization program which includes automated exposure control, adjustment of the mA and/or kV according to patient size and/or use of iterative reconstruction technique. COMPARISON:  09/28/2020 FINDINGS: Cardiovascular: Aortic atherosclerosis. Normal heart size. Left and right coronary artery calcifications. No  pericardial effusion. Mediastinum/Nodes: No enlarged mediastinal, hilar, or axillary lymph nodes. Thyroid gland, trachea, and esophagus demonstrate no significant findings. Lungs/Pleura: Very extensive, new dense consolidation and clustered centrilobular nodularity throughout the dependent right lower lobe (series 11, image 254). No significant change in a thick-walled cavitary lesion of the right lower lobe measuring 2.9 x 2.1 cm (series 11, image 193). No significant change in a thick-walled cavitary lesion of the superior segment left lower lobe measuring 1.5 x 1.1 cm (series 11, image 190). Unchanged dense consolidation and fibrosis of the right middle lobe. Trace right pleural effusion. Upper  Abdomen: No acute abnormality. Musculoskeletal: No chest wall abnormality. No suspicious osseous lesions identified. IMPRESSION: 1. Very extensive, new dense consolidation and clustered centrilobular nodularity throughout the dependent right lower lobe, consistent with significantly worsened infection or aspiration. Degree of consolidation and airspace disease is generally greater than expected for chronic, uncomplicated atypical mycobacterial infection. 2. No significant change in thick-walled cavitary lesions of the bilateral lower lobes. 3. Unchanged dense consolidation and fibrosis of the right middle lobe as well as other fibrotic and nodular stigmata of chronic atypical infection. 4. Trace right pleural effusion. 5. Coronary artery disease. Aortic Atherosclerosis (ICD10-I70.0). Electronically Signed   By: Delanna Ahmadi M.D.   On: 06/12/2021 20:48    Assessment & Plan:   There are no diagnoses linked to this encounter. I have discontinued Julie Silva's PreserVision AREDS. I am also having her maintain her bimatoprost, dorzolamide-timolol, Catheters, Eliquis, estradiol, Wheat Dextrin (BENEFIBER PO), cefdinir, amiodarone, acetaminophen, omeprazole, megestrol, and sodium chloride HYPERTONIC.  No orders of the defined types were placed in this encounter.    Follow-up: No follow-ups on file.  Scarlette Calico, MD

## 2021-06-24 ENCOUNTER — Encounter: Payer: Self-pay | Admitting: Adult Health

## 2021-06-24 ENCOUNTER — Ambulatory Visit (INDEPENDENT_AMBULATORY_CARE_PROVIDER_SITE_OTHER): Payer: Medicare Other | Admitting: Adult Health

## 2021-06-24 VITALS — BP 132/60 | HR 53 | Temp 97.6°F | Ht 64.5 in | Wt 89.4 lb

## 2021-06-24 DIAGNOSIS — R0609 Other forms of dyspnea: Secondary | ICD-10-CM

## 2021-06-24 DIAGNOSIS — J9611 Chronic respiratory failure with hypoxia: Secondary | ICD-10-CM | POA: Insufficient documentation

## 2021-06-24 DIAGNOSIS — I5189 Other ill-defined heart diseases: Secondary | ICD-10-CM | POA: Diagnosis not present

## 2021-06-24 DIAGNOSIS — J471 Bronchiectasis with (acute) exacerbation: Secondary | ICD-10-CM

## 2021-06-24 MED ORDER — SODIUM CHLORIDE 3 % IN NEBU
INHALATION_SOLUTION | Freq: Two times a day (BID) | RESPIRATORY_TRACT | 12 refills | Status: DC | PRN
Start: 2021-06-24 — End: 2022-07-19

## 2021-06-24 MED ORDER — LEVALBUTEROL HCL 0.63 MG/3ML IN NEBU: 0.6300 mg | INHALATION_SOLUTION | Freq: Every day | RESPIRATORY_TRACT | 5 refills | Status: DC

## 2021-06-24 MED ORDER — AMOXICILLIN-POT CLAVULANATE 250-62.5 MG/5ML PO SUSR: 875.0000 mg | Freq: Two times a day (BID) | ORAL | 0 refills | Status: AC

## 2021-06-24 NOTE — Addendum Note (Signed)
Addended by: Vanessa Barbara on: 06/24/2021 05:46 PM   Modules accepted: Orders

## 2021-06-24 NOTE — Patient Instructions (Addendum)
Augmentin '875mg'$  Twice daily  for 10 days  Liquid mucinex Twice daily   Begin Hypertonic Neb daily in am .  Begin Xopenex Neb daily in evening  Flutter valve after nebs Twice daily   Begin Vest when available.  High protein /calorie diet  Follow up with Dr. Vaughan Browner next week as planned  Return sputum cultures when available  Labs today  Please contact office for sooner follow up if symptoms do not improve or worsen or seek emergency care

## 2021-06-24 NOTE — Telephone Encounter (Signed)
Called and spoke with [name] to let her know that Julie Silva has updated her OV notes for the vest that was ordered. She expressed understanding. Nothing further needed at this time.

## 2021-06-24 NOTE — Telephone Encounter (Signed)
Called and spoke with Julie Silva. She stated that Julie Silva needed 3% saline sent to the pharmacy. Pat verified pharmacy. Advised pat I would send the prescription in. Julie Silva also stated that patient has been coughing and wheezing the past 3 days and said that she wanted an appointment with a NP today for them to listen to her.   Patient is scheduled with TP at 4:30.   Nothing further needed.

## 2021-06-24 NOTE — Assessment & Plan Note (Addendum)
Acute bronchiectatic exacerbation recent CT chest with increased dense consolidation and nodularity through the right lower lobe possibly secondary to superimposed pneumonia Patient may have a component of some aspiration, also need to consider that she is on amiodarone.  We will check CBC and sed rate.  Patient is a very complicated case with chronic urinary tract infections with drug resistance on chronic antibiotics. For now we will treat for a bronchiectatic flare with possible superimposed pneumonia.  We will begin with empiric antibiotics Augmentin x10 days.  Need to use liquid form per patient request has difficulty swallowing. Need to increase her mucociliary clearance.  Patient is to start hypertonic nebs.  Also will add in Xopenex low-dose once daily.  She is to do flutter valve after nebs treatment She is also to start vest therapy when available. She has presumed MAI.  Sputum cultures and sputum AFB is pending.  She has been felt to be too high risk for bronchoscopy for sputum induction and also concern for tolerance of MAI treatment regimen  Plan  . Patient Instructions  Augmentin '875mg'$  Twice daily  for 10 days  Liquid mucinex Twice daily   Begin Hypertonic Neb daily in am .  Begin Xopenex Neb daily in evening  Flutter valve after nebs Twice daily   Begin Vest when available.  High protein /calorie diet  Follow up with Dr. Vaughan Browner next week as planned  Return sputum cultures when available  Labs today  Please contact office for sooner follow up if symptoms do not improve or worsen or seek emergency care

## 2021-06-24 NOTE — Progress Notes (Signed)
$'@Patient'T$  ID: Julie Silva, female    DOB: [birth year], 88 y.o.   MRN: [id number]  Chief Complaint  Patient presents with   Acute Visit    Referring provider: Janith Lima, MD  HPI: 88 year old female never smoker followed for bronchiectasis, abnormal CT chest suspicious for MAI And chronic respiratory failure on oxygen with activity and at bedtime Medical history significant for A-fib on amiodarone and Eliquis, s/p PPM  Hx of chronic UTI-  on Macrodantin for years (stopped  Does self cath , follows with ID at [facility].   TEST/EVENTS :  High-resolution CT chest June 10, 2021 very extensive, new dense consolidation and clustered nodularity through the dependent right lower lobe.  No significant change in the thick walled cavitary lesion in the right lower lobe measuring 2.9 x 2.1 cm.  No significant change in thick-walled cavitary lesion in the left lower lobe measuring 1.5 x 1.1 cm.  Unchanged dense consolidation and fibrosis of the right middle lobe.  06/24/2021 Acute OV : Bronchiectasis , O2 RF Patient presents for an acute office visit.  She complains over the last couple weeks that her cough has been getting progressively worse with thick mucus.  Rattling mucus, She is having trouble getting her mucus up.  Patient has underlying bronchiectasis and presumed MAI.  Recent abnormal CT chest on June 8 showing extensive new consolidation and clustered nodularity throughout the right lower lobe.  No significant change in her thick-walled cavitary lesion in the right lower lobe and left lower lobe. She remains on oxygen 2 L with activity and at bedtime.  Says that she wears her oxygen on occasion with activities.  Uses it most nights.  Last visit she was ordered and vest therapy but has not received.  She was also started on hypertonic nebs but has not been able to find them as they are on backorder. She was recommended on sputum culture and sputum AFB but was unable to provide sample She has  flutter valve. Continues to have low weight and poor appetite.  Current weight is at 90 pounds with a BMI of 17 She has been followed by primary care.  Now on Amiodarone for A Fib. Followed by [facility] cardiology  (previous A Fib with RVR 02/2021 )  Was treated with Levaquin 05/2021 for UTI. Now on omnicef daily for UTI.  Daughters are with her today and help with her medical care.   Allergies  Allergen Reactions   Codeine Other (See Comments)    Passed out   Colchicine Diarrhea    Immunization History  Administered Date(s) Administered   Fluad Quad(high Dose 65+) 12/11/2018, 12/25/2019   Influenza, High Dose Seasonal PF 10/03/2013, 09/25/2014, 10/06/2015, 10/11/2016, 10/26/2017, 02/02/2021   Influenza-Unspecified 10/03/2012, 11/04/2019, 02/02/2021   PFIZER(Purple Top)SARS-COV-2 Vaccination 01/29/2019, 02/12/2019, 11/25/2019   Pneumococcal Conjugate-13 05/07/2013   Pneumococcal Polysaccharide-23 06/09/2010   Td 04/03/2000   Tdap 05/07/2013   Unspecified SARS-COV-2 Vaccination 01/29/2019, 02/19/2019   Zoster, Live 06/08/2011    Past Medical History:  Diagnosis Date   Anemia    Aortic atherosclerosis (HCC)    Arthritis    Osteoporosis, neck stiffness   Atrial fibrillation ([place])    Bleeds easily [facility])    "father was a hemophiliac" - pt not being bothered in recent years.   CAD (coronary artery disease)    Calcium deposit in bursa of knee 2017   Cancer ([place])    skin cancer "basal cell".   Chicken pox    Cyst,  Baker's knee    left knee- tx. Cortisone injection 05-05-15 in office- "improved pain relief and swelling left ankle and knee"   Diverticulitis    Dysrhythmia    Dr. McAlhany-cardiology   GERD (gastroesophageal reflux disease)    Glaucoma of both eyes    Gout    Hearing loss of both ears    Bilateral ears- hearing aids used- right ear hearing betther than left.   Hiatal hernia    History of blood transfusion 1957   "lots; most related to my periods"-last 1968 -s/p  Hysterectomy   Migraines    "years ago" (02/04/2013)   Motion sickness    back seat - cars, sudden movements   PAF (paroxysmal atrial fibrillation) (HCC)    Pancreatic cyst    Pelvic floor dysfunction    PONV (postoperative nausea and vomiting)    Risk for falls    "wobbley" per daughter   Self-catheterizes urinary bladder    "daily- retained urine and chronic UTI"   SVT (supraventricular tachycardia) ([place])    Dr. Darnell Level. Taylor-follows "loop recorder left chest"   Swallowing difficulty    freq occ. mostley liquids   Urine incontinence    UTI (urinary tract infection)    Chronic Urinary tract infections- tx Macrobid daily.   Vertigo    Wears hearing aid in both ears     Tobacco History: Social History   Tobacco Use  Smoking Status Never  Smokeless Tobacco Never   Counseling given: Not Answered   Outpatient Medications Prior to Visit  Medication Sig Dispense Refill   acetaminophen (TYLENOL) 500 MG tablet Take 500 mg by mouth 2 (two) times daily.     amiodarone (PACERONE) 100 MG tablet Take 100 mg by mouth daily.     bimatoprost (LUMIGAN) 0.01 % SOLN Place 1 drop into both eyes at bedtime.     Catheters MISC Replace catheter twice daily. 180 each 1   cefdinir (OMNICEF) 250 MG/5ML suspension Take 250 mg by mouth daily.     dorzolamide-timolol (COSOPT) 22.3-6.8 MG/ML ophthalmic solution Place 1 drop into both eyes 2 (two) times daily.      ELIQUIS 2.5 MG TABS tablet TAKE 1 TABLET TWICE A DAY 180 tablet 0   estradiol (ESTRACE) 0.1 MG/GM vaginal cream Place 1 Applicatorful vaginally at bedtime.     megestrol (MEGACE) 40 MG tablet Take 1 tablet (40 mg total) by mouth daily. 90 tablet 1   omeprazole (PRILOSEC) 20 MG capsule TAKE 1 CAPSULE BY MOUTH EVERY DAY 90 capsule 1   Wheat Dextrin (BENEFIBER PO) Take 1 mg by mouth daily.     dronabinol (MARINOL) 2.5 MG capsule Take 1 capsule (2.5 mg total) by mouth 2 (two) times daily before lunch and supper. (Patient not taking: Reported on  06/24/2021) 60 capsule 0   sodium chloride HYPERTONIC 3 % nebulizer solution Take by nebulization 2 (two) times daily as needed for other. (Patient not taking: Reported on 06/24/2021) 750 mL 12   No facility-administered medications prior to visit.     Review of Systems:   Constitutional:   No  weight loss, night sweats,  Fevers, chills,  +fatigue, or  lassitude.  HEENT:   No headaches,  Difficulty swallowing,  Tooth/dental problems, or  Sore throat,                No sneezing, itching, ear ache, nasal congestion, post nasal drip,   CV:  No chest pain,  Orthopnea, PND, swelling in lower extremities, anasarca,  dizziness, palpitations, syncope.   GI  No heartburn, indigestion, abdominal pain, nausea, vomiting, diarrhea, change in bowel habits, loss of appetite, bloody stools.   Resp:   No chest wall deformity  Skin: no rash or lesions.  GU: no dysuria, change in color of urine, no urgency or frequency.  No flank pain, no hematuria   MS:  No joint pain or swelling.  No decreased range of motion.  No back pain.    Physical Exam  BP 132/60 (BP Location: Right Arm, Patient Position: Sitting, Cuff Size: Normal)   Pulse (!) 53   Temp 97.6 F (36.4 C) (Oral)   Ht 5' 4.5" (1.638 m)   Wt 89 lb 6.4 oz (40.6 kg)   SpO2 91%   BMI 15.11 kg/m   GEN: A/Ox3; pleasant , NAD, thin, cachectic, in wheelchair   HEENT:  [place]/AT,  , NOSE-clear, THROAT-clear, no lesions, no postnasal drip or exudate noted.   NECK:  Supple w/ fair ROM; no JVD; normal carotid impulses w/o bruits; no thyromegaly or nodules palpated; no lymphadenopathy.    RESP scattered rhonchi  no accessory muscle use, no dullness to percussion  CARD:  RRR, no m/r/g, no peripheral edema, pulses intact, no cyanosis or clubbing.  GI:   Soft & nt; nml bowel sounds; no organomegaly or masses detected.   Musco: Warm bil, no deformities or joint swelling noted.   Neuro: alert, no focal deficits noted.    Skin: Warm, no lesions or  rashes    Lab Results:  CBC    Component Value Date/Time   WBC 8.1 03/30/2021 1446   RBC 3.73 (L) 03/30/2021 1446   HGB 11.4 (L) 03/30/2021 1446   HGB 12.4 02/23/2018 1525   HCT 34.7 (L) 03/30/2021 1446   HCT 36.2 02/23/2018 1525   PLT 282.0 03/30/2021 1446   PLT 239 02/23/2018 1525   MCV 93.1 03/30/2021 1446   MCV 93 02/23/2018 1525   MCV 93 07/03/2012 1533   MCH 31.9 02/23/2018 1525   MCH 30.9 05/08/2015 0930   MCHC 32.7 03/30/2021 1446   RDW 15.3 03/30/2021 1446   RDW 12.4 02/23/2018 1525   RDW 13.1 07/03/2012 1533   LYMPHSABS 1.0 03/30/2021 1446   LYMPHSABS 1.7 02/23/2018 1525   LYMPHSABS 1.2 07/03/2012 1533   MONOABS 0.8 03/30/2021 1446   MONOABS 0.6 07/03/2012 1533   EOSABS 0.3 03/30/2021 1446   EOSABS 0.1 02/23/2018 1525   EOSABS 0.1 07/03/2012 1533   BASOSABS 0.0 03/30/2021 1446   BASOSABS 0.0 02/23/2018 1525   BASOSABS 0.1 07/03/2012 1533    BMET    Component Value Date/Time   NA 141 07/30/2020 1629   NA 142 02/23/2018 1525   K 4.3 07/30/2020 1629   CL 104 07/30/2020 1629   CO2 29 07/30/2020 1629   GLUCOSE 89 07/30/2020 1629   BUN 19 10/29/2020 1106   BUN 17 02/23/2018 1525   CREATININE 0.91 10/29/2020 1106   CALCIUM 9.0 07/30/2020 1629   GFRNONAA 61 02/23/2018 1525   GFRAA 70 02/23/2018 1525    BNP No results found for: "BNP"  ProBNP    Component Value Date/Time   PROBNP 162.0 (H) 03/30/2021 1446    Imaging: CT CHEST HIGH RESOLUTION  Result Date: 06/12/2021 CLINICAL DATA:  Interstitial lung disease, MAI, bronchiectasis EXAM: CT CHEST WITHOUT CONTRAST TECHNIQUE: Multidetector CT imaging of the chest was performed following the standard protocol without intravenous contrast. High resolution imaging of the lungs, as well as inspiratory and expiratory  imaging, was performed. RADIATION DOSE REDUCTION: This exam was performed according to the departmental dose-optimization program which includes automated exposure control, adjustment of the mA  and/or kV according to patient size and/or use of iterative reconstruction technique. COMPARISON:  09/28/2020 FINDINGS: Cardiovascular: Aortic atherosclerosis. Normal heart size. Left and right coronary artery calcifications. No pericardial effusion. Mediastinum/Nodes: No enlarged mediastinal, hilar, or axillary lymph nodes. Thyroid gland, trachea, and esophagus demonstrate no significant findings. Lungs/Pleura: Very extensive, new dense consolidation and clustered centrilobular nodularity throughout the dependent right lower lobe (series 11, image 254). No significant change in a thick-walled cavitary lesion of the right lower lobe measuring 2.9 x 2.1 cm (series 11, image 193). No significant change in a thick-walled cavitary lesion of the superior segment left lower lobe measuring 1.5 x 1.1 cm (series 11, image 190). Unchanged dense consolidation and fibrosis of the right middle lobe. Trace right pleural effusion. Upper Abdomen: No acute abnormality. Musculoskeletal: No chest wall abnormality. No suspicious osseous lesions identified. IMPRESSION: 1. Very extensive, new dense consolidation and clustered centrilobular nodularity throughout the dependent right lower lobe, consistent with significantly worsened infection or aspiration. Degree of consolidation and airspace disease is generally greater than expected for chronic, uncomplicated atypical mycobacterial infection. 2. No significant change in thick-walled cavitary lesions of the bilateral lower lobes. 3. Unchanged dense consolidation and fibrosis of the right middle lobe as well as other fibrotic and nodular stigmata of chronic atypical infection. 4. Trace right pleural effusion. 5. Coronary artery disease. Aortic Atherosclerosis (ICD10-I70.0). Electronically Signed   By: Delanna Ahmadi M.D.   On: 06/12/2021 20:48          No data to display          No results found for: "NITRICOXIDE"      Assessment & Plan:   Bronchiectasis ([place]) Acute  bronchiectatic exacerbation recent CT chest with increased dense consolidation and nodularity through the right lower lobe possibly secondary to superimposed pneumonia Patient may have a component of some aspiration, also need to consider that she is on amiodarone.  We will check CBC and sed rate.  Patient is a very complicated case with chronic urinary tract infections with drug resistance on chronic antibiotics. For now we will treat for a bronchiectatic flare with possible superimposed pneumonia.  We will begin with empiric antibiotics Augmentin x10 days.  Need to use liquid form per patient request has difficulty swallowing. Need to increase her mucociliary clearance.  Patient is to start hypertonic nebs.  Also will add in Xopenex low-dose once daily.  She is to do flutter valve after nebs treatment She is also to start vest therapy when available. She has presumed MAI.  Sputum cultures and sputum AFB is pending.  She has been felt to be too high risk for bronchoscopy for sputum induction and also concern for tolerance of MAI treatment regimen  Plan  . Patient Instructions  Augmentin '875mg'$  Twice daily  for 10 days  Liquid mucinex Twice daily   Begin Hypertonic Neb daily in am .  Begin Xopenex Neb daily in evening  Flutter valve after nebs Twice daily   Begin Vest when available.  High protein /calorie diet  Follow up with Dr. Vaughan Browner next week as planned  Return sputum cultures when available  Labs today  Please contact office for sooner follow up if symptoms do not improve or worsen or seek emergency care         Diastolic dysfunction Does not appear to be in fluid overload  on exam.  We will check BMP today.  She has had recent hospitalization earlier this year with A-fib RVR and is now on amiodarone.  Chronic respiratory failure with hypoxia (HCC) No increased oxygen demands.  Continue on oxygen 2 L with activity and at bedtime   I spent 45   minutes dedicated to the care of  this patient on the date of this encounter to include pre-visit review of records, face-to-face time with the patient discussing conditions above, post visit ordering of testing, clinical documentation with the electronic health record, making appropriate referrals as documented, and communicating necessary findings to members of the patients care team.    Rexene Edison, NP 06/24/2021

## 2021-06-24 NOTE — Assessment & Plan Note (Signed)
No increased oxygen demands.  Continue on oxygen 2 L with activity and at bedtime

## 2021-06-24 NOTE — Assessment & Plan Note (Signed)
Does not appear to be in fluid overload on exam.  We will check BMP today.  She has had recent hospitalization earlier this year with A-fib RVR and is now on amiodarone.

## 2021-06-25 ENCOUNTER — Other Ambulatory Visit (INDEPENDENT_AMBULATORY_CARE_PROVIDER_SITE_OTHER): Payer: Medicare Other

## 2021-06-25 DIAGNOSIS — J471 Bronchiectasis with (acute) exacerbation: Secondary | ICD-10-CM

## 2021-06-25 DIAGNOSIS — R0609 Other forms of dyspnea: Secondary | ICD-10-CM | POA: Diagnosis not present

## 2021-06-25 LAB — CBC WITH DIFFERENTIAL/PLATELET
Basophils Absolute: 0 10*3/uL (ref 0.0–0.1)
Basophils Relative: 0.5 % (ref 0.0–3.0)
Eosinophils Absolute: 0.1 10*3/uL (ref 0.0–0.7)
Eosinophils Relative: 0.6 % (ref 0.0–5.0)
HCT: 33.4 % — ABNORMAL LOW (ref 36.0–46.0)
Hemoglobin: 10.6 g/dL — ABNORMAL LOW (ref 12.0–15.0)
Lymphocytes Relative: 14.4 % (ref 12.0–46.0)
Lymphs Abs: 1.3 10*3/uL (ref 0.7–4.0)
MCHC: 31.8 g/dL (ref 30.0–36.0)
MCV: 90.8 fl (ref 78.0–100.0)
Monocytes Absolute: 0.9 10*3/uL (ref 0.1–1.0)
Monocytes Relative: 9.5 % (ref 3.0–12.0)
Neutro Abs: 6.9 10*3/uL (ref 1.4–7.7)
Neutrophils Relative %: 75 % (ref 43.0–77.0)
Platelets: 573 10*3/uL — ABNORMAL HIGH (ref 150.0–400.0)
RBC: 3.68 Mil/uL — ABNORMAL LOW (ref 3.87–5.11)
RDW: 18.3 % — ABNORMAL HIGH (ref 11.5–15.5)
WBC: 9.2 10*3/uL (ref 4.0–10.5)

## 2021-06-25 LAB — BASIC METABOLIC PANEL
BUN: 40 mg/dL — ABNORMAL HIGH (ref 6–23)
CO2: 22 mEq/L (ref 19–32)
Calcium: 9.6 mg/dL (ref 8.4–10.5)
Chloride: 103 mEq/L (ref 96–112)
Creatinine, Ser: 0.94 mg/dL (ref 0.40–1.20)
GFR: 52.85 mL/min — ABNORMAL LOW (ref >60.00)
Glucose, Bld: 108 mg/dL — ABNORMAL HIGH (ref 70–99)
Potassium: 4.2 mEq/L (ref 3.5–5.1)
Sodium: 136 mEq/L (ref 135–145)

## 2021-06-25 LAB — BRAIN NATRIURETIC PEPTIDE: Pro B Natriuretic peptide (BNP): 138 pg/mL — ABNORMAL HIGH (ref 0.0–100.0)

## 2021-06-28 ENCOUNTER — Other Ambulatory Visit: Payer: Self-pay | Admitting: Internal Medicine

## 2021-06-28 ENCOUNTER — Other Ambulatory Visit: Payer: Medicare Other

## 2021-06-28 DIAGNOSIS — K2100 Gastro-esophageal reflux disease with esophagitis, without bleeding: Secondary | ICD-10-CM

## 2021-06-29 ENCOUNTER — Ambulatory Visit (INDEPENDENT_AMBULATORY_CARE_PROVIDER_SITE_OTHER): Payer: Medicare Other | Admitting: Pulmonary Disease

## 2021-06-29 ENCOUNTER — Encounter: Payer: Self-pay | Admitting: Pulmonary Disease

## 2021-06-29 VITALS — BP 112/54 | HR 97 | Temp 97.7°F | Ht 64.0 in | Wt 90.0 lb

## 2021-06-29 DIAGNOSIS — J471 Bronchiectasis with (acute) exacerbation: Secondary | ICD-10-CM | POA: Diagnosis not present

## 2021-06-29 DIAGNOSIS — J479 Bronchiectasis, uncomplicated: Secondary | ICD-10-CM | POA: Diagnosis not present

## 2021-06-29 NOTE — Progress Notes (Signed)
Julie Silva    [id number]    1927/08/03  Primary Care Physician:Jones, Arvid Right, MD  Referring Physician: Janith Lima, MD [address] [place],  [place] [address]  Chief complaint: Follow up for abnormal CT scan.  HPI: Julie Silva was previously followed in the pulmonary clinic for abnormal CT with bronchiectatic, tree-in-bud changes concerning for MAI.  She was last seen in 2018 At that time she was also on chronic nitrofurantoin for UTI for many years for chronic UTI  Recommendations were for conservative management as she was asymptomatic and not producing sputum Bronchoscopy or treatment for MAI was not recommended due to age, frailty, malnutrition with weight loss Suspicion for nitrofurantoin toxicity was low but she was taken off this medication and placed on Bactrim for UTI prophylaxis. She denies any signs and symptoms of connective tissue disease, autoimmune disease.  She is a lifelong non-smoker.  Interim History:  She has been dealing with chronic urinary tract infections with recurrent flareups on chronic antibiotics.  She had an acute office visit in June 2023 with worsening cough.  CT scan on June 8 showing extensive new consolidation clustered nodularity in the right lower lobe.  She was given Augmentin for that but flutter valve.  Vest therapy was ordered and is due to be delivered in the next week.  Overall states that breathing is improved   Allergies as of 03/25/2020 - Review Complete 03/25/2020  Allergen Reaction Noted   Codeine Other (See Comments) 02/02/2013    Past Medical History:  Diagnosis Date   Anemia    Arthritis    Osteoporosis, neck stiffness   Atrial fibrillation ([place])    Bleeds easily [facility])    "father was a hemophiliac" - pt not being bothered in recent years.   Calcium deposit in bursa of knee 2017   Cancer ([place])    skin cancer "basal cell".   Chicken pox    Cyst, Baker's knee    left knee- tx. Cortisone injection  05-05-15 in office- "improved pain relief and swelling left ankle and knee"   Diverticulitis    Dysrhythmia    Dr. McAlhany-cardiology   GERD (gastroesophageal reflux disease)    Glaucoma of both eyes    Gout    Hearing loss of both ears    Bilateral ears- hearing aids used- right ear hearing betther than left.   History of blood transfusion 1957   "lots; most related to my periods"-last 1968 -s/p Hysterectomy   Migraines    "years ago" (02/04/2013)   Motion sickness    back seat - cars, sudden movements   PONV (postoperative nausea and vomiting)    Risk for falls    "wobbley" per daughter   Self-catheterizes urinary bladder    "daily- retained urine and chronic UTI"   SVT (supraventricular tachycardia) ([place])    Dr. Darnell Level. [place]-follows "loop recorder left chest"   Swallowing difficulty    freq occ. mostley liquids   Urine incontinence    UTI (urinary tract infection)    Chronic Urinary tract infections- tx Macrobid daily.   Vertigo    Wears hearing aid in both ears     Past Surgical History:  Procedure Laterality Date   [place]   Repair-    BREAST BIOPSY Bilateral    "both were fine"   CATARACT EXTRACTION W/ INTRAOCULAR LENS IMPLANT Left    CATARACT EXTRACTION W/PHACO Right  10/16/2018   Procedure: CATARACT EXTRACTION PHACO AND INTRAOCULAR LENS PLACEMENT ([place])  RIGHT MiLoop diabetes 01:05.8  21.0%  13.77;  Surgeon: Birder Robson, MD;  Location: [facility];  Service: Ophthalmology;  Laterality: Right;  BS tends to drop in AM and would like early surgery so she can get home to eat, please   CHOLECYSTECTOMY  2014   COMBINED HYSTERECTOMY ABDOMINAL W/ A&P REPAIR / OOPHORECTOMY     DILATION AND CURETTAGE OF UTERUS     EP IMPLANTABLE DEVICE N/A 06/19/2014   Procedure: Loop Recorder Insertion;  Surgeon: Evans Lance, MD;  Location: [facility];  Service: Cardiovascular;  Laterality: N/A;   EXCISION OF BREAST BIOPSY Right 05/13/2015    Procedure: RIGHT BREAST EXCISIONAL BIOPSY x2;  Surgeon: Armandina Gemma, MD;  Location: [facility];  Service: General;  Laterality: Right;   TONSILLECTOMY  1946   TOTAL ABDOMINAL HYSTERECTOMY     VAGINAL HYSTERECTOMY  1968    Family History  Problem Relation Age of Onset   Stroke Father    CVA Father    Ovarian cancer Mother    Cancer - Other Sister        Breast   Breast cancer Sister    Cancer - Other Brother        Throat   Cancer - Other Sister        Throat   Brain cancer Sister    Cancer - Other Other        Brain   Breast cancer Other     Social History   Socioeconomic History   Marital status: Widowed    Spouse name: Not on file   Number of children: 4   Years of education: 12   Highest education level: Not on file  Occupational History   Occupation: Runner, broadcasting/film/video  Tobacco Use   Smoking status: Never Smoker   Smokeless tobacco: Never Used  Scientific laboratory technician Use: Never used  Substance and Sexual Activity   Alcohol use: No    Alcohol/week: 0.0 standard drinks   Drug use: No   Sexual activity: Never  Other Topics Concern   Not on file  Social History Narrative   Lives alone in a one story home.  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    Social Determinants of Health   Financial Resource Strain: Not on file  Food Insecurity: Not on file  Transportation Needs: Not on file  Physical Activity: Not on file  Stress: Not on file  Social Connections: Not on file  Intimate Partner Violence: Not on file    Review of systems: Review of Systems  Constitutional: Negative for fever and chills.  HENT: Negative.   Eyes: Negative for blurred vision.  Respiratory: as per HPI  Cardiovascular: Negative for chest pain and palpitations.  Gastrointestinal: Negative for vomiting, diarrhea, blood per rectum. Genitourinary: Negative for dysuria, urgency, frequency and hematuria.  Musculoskeletal: Negative for myalgias, back pain and joint pain.  Skin: Negative  for itching and rash.  Neurological: Negative for dizziness, tremors, focal weakness, seizures and loss of consciousness.  Endo/Heme/Allergies: Negative for environmental allergies.  Psychiatric/Behavioral: Negative for depression, suicidal ideas and hallucinations.  All other systems reviewed and are negative.  Physical Exam: Blood pressure (!) 112/54, pulse 97, temperature 97.7 F (36.5 C), temperature source Oral, height '5\' 4"'$  (1.626 m), weight 90 lb (40.8 kg), SpO2 99 %. Gen:      No acute distress HEENT:  EOMI, sclera anicteric  Neck:     No masses; no thyromegaly Lungs:    Clear to auscultation bilaterally; normal respiratory effort CV:         Regular rate and rhythm; no murmurs Abd:      + bowel sounds; soft, non-tender; no palpable masses, no distension Ext:    No edema; adequate peripheral perfusion Skin:      Warm and dry; no rash Neuro: alert and oriented x 3 Psych: normal mood and affect   Data Reviewed: CT scan  05/01/15- diffuse emphysematous changes, bilateral consolidative nodular opacities in the lower lobes, right middle lobe bronchiectasis, tree-in-bud opacity.   CT scan 07/31/15-  similar to above. Some consolidative changes are worse when others show improvement. Images personally reviewed.   CT chest 03/19/2020-moderate bilateral bronchiectasis unchanged from before, persistent tree-in-bud with cavitary 2.1 cm right lower lobe nodule and a new 1.3 cm nodular consolidation in the right upper lobe.  High-resolution CT 09/25/2020-bronchiectasis, fibrosis in the right middle lobe, enlargement of right lower lobe cavitary lesion with new Lesion in the left lower lobe. I have reviewed the images personally.  Assessment:  Bronchiectasis, concern for MAI She has predominantly right middle lobe bronchiectasis, tree-in-bud opacities and consolidative changes in the bases. This is suggestive of MAI infection. She is using a flutter valve but she is unable to bring up  secretions.  She had a recent exacerbation with CT scan suggestive of superimposed acute bacterial pneumonia.  Symptoms are improving with Augmentin, hypertonic saline.  She continues a flutter valve Percussion vest is to be delivered this week  She has been off nitrofurantoin since 2018 and is not an ongoing issue.  Plan/Recommendations: - Continue flutter valve, saline nebs, percussion vest - Follow-up CT in 3 months  Marshell Garfinkel MD [facility] Pager [phone] If no answer or after 3pm call: [phone] 06/29/2021, 3:58 PM  CC: Janith Lima, MD

## 2021-06-30 ENCOUNTER — Encounter: Payer: Self-pay | Admitting: Pulmonary Disease

## 2021-07-07 ENCOUNTER — Telehealth: Payer: Self-pay | Admitting: Adult Health

## 2021-07-08 NOTE — Telephone Encounter (Signed)
ATC to call Vicente Males with [employer] to verify information needed. No one answered the call and no voicemail

## 2021-07-12 ENCOUNTER — Telehealth: Payer: Self-pay | Admitting: *Deleted

## 2021-07-12 ENCOUNTER — Other Ambulatory Visit (INDEPENDENT_AMBULATORY_CARE_PROVIDER_SITE_OTHER): Payer: Medicare Other

## 2021-07-12 ENCOUNTER — Encounter: Payer: Self-pay | Admitting: Internal Medicine

## 2021-07-12 ENCOUNTER — Other Ambulatory Visit: Payer: Self-pay | Admitting: Internal Medicine

## 2021-07-12 DIAGNOSIS — B952 Enterococcus as the cause of diseases classified elsewhere: Secondary | ICD-10-CM | POA: Diagnosis not present

## 2021-07-12 DIAGNOSIS — N390 Urinary tract infection, site not specified: Secondary | ICD-10-CM

## 2021-07-12 DIAGNOSIS — N3000 Acute cystitis without hematuria: Secondary | ICD-10-CM | POA: Diagnosis not present

## 2021-07-12 NOTE — Telephone Encounter (Signed)
Correction, Julie Silva per the [facility] the OV note needs to be amended to say that she has had a productive cough continuous for >6 months.  Thank you.

## 2021-07-12 NOTE — Telephone Encounter (Signed)
Julie Silva, per the [facility]s the OV note needs to be amended to say that she has had a productive cough continuous for >6 months.  Thank you.

## 2021-07-12 NOTE — Telephone Encounter (Addendum)
Per Estill Bamberg with [facility], they need the NP to amend their note stating that the patient has per the pccs the OV note needs to be amended to say that she has had a productive cough continuous for >6 months.  Thank you. Forwarded message to [employer] NP, however, she is not the one that ordered the vest.  Forwarding to [place] to amend her note.  [name],  Can you please amend your note to reflect that the patient has had infections >6 months.  Per the [facility], this is what is needed to get her the smart vest.  Thank you.

## 2021-07-12 NOTE — Telephone Encounter (Signed)
Note forwarded to [facility].  Closing this encounter.

## 2021-07-13 LAB — URINALYSIS, ROUTINE W REFLEX MICROSCOPIC
Bilirubin Urine: NEGATIVE
Hgb urine dipstick: NEGATIVE
Ketones, ur: NEGATIVE
Leukocytes,Ua: NEGATIVE
Nitrite: NEGATIVE
Specific Gravity, Urine: 1.015 (ref 1.000–1.030)
Total Protein, Urine: NEGATIVE
Urine Glucose: NEGATIVE
Urobilinogen, UA: 0.2 (ref 0.0–1.0)
pH: 5 (ref 5.0–8.0)

## 2021-07-14 ENCOUNTER — Encounter: Payer: Self-pay | Admitting: Internal Medicine

## 2021-07-14 LAB — CULTURE, URINE COMPREHENSIVE
MICRO NUMBER:: 13625083
RESULT:: NO GROWTH
SPECIMEN QUALITY:: ADEQUATE

## 2021-07-14 NOTE — Telephone Encounter (Signed)
Referral was done 06/22/21... see phone note. Med was denied...Julie Silva

## 2021-07-15 ENCOUNTER — Encounter: Payer: Self-pay | Admitting: Internal Medicine

## 2021-07-19 NOTE — Telephone Encounter (Signed)
I've sent a email to Adonis Huguenin at [facility] to see what is going on.

## 2021-07-19 NOTE — Telephone Encounter (Signed)
Note from April states that she has had a productive cough >12 months. Should be all set.

## 2021-07-20 ENCOUNTER — Telehealth: Payer: Self-pay | Admitting: *Deleted

## 2021-07-20 ENCOUNTER — Other Ambulatory Visit: Payer: Self-pay | Admitting: Internal Medicine

## 2021-07-20 DIAGNOSIS — R64 Cachexia: Secondary | ICD-10-CM

## 2021-07-20 DIAGNOSIS — J9611 Chronic respiratory failure with hypoxia: Secondary | ICD-10-CM

## 2021-07-20 DIAGNOSIS — J449 Chronic obstructive pulmonary disease, unspecified: Secondary | ICD-10-CM

## 2021-07-20 NOTE — Telephone Encounter (Signed)
Received call from [facility] with [employer], she states that in order for patient to receive the smart vest, documentation is needed stating that she has had daily productive cough for greater than 6 months.  Please advise

## 2021-07-20 NOTE — Telephone Encounter (Signed)
In my note from 04/30/21 its states that patient has daily productive cough lasting >12 months

## 2021-07-21 ENCOUNTER — Other Ambulatory Visit: Payer: Self-pay | Admitting: Internal Medicine

## 2021-07-21 DIAGNOSIS — Z515 Encounter for palliative care: Secondary | ICD-10-CM

## 2021-07-21 MED ORDER — MORPHINE SULFATE (CONCENTRATE) 10 MG /0.5 ML PO SOLN: 10.0000 mg | Freq: Four times a day (QID) | ORAL | 0 refills | Status: DC | PRN

## 2021-07-21 NOTE — Telephone Encounter (Signed)
Heather, do you have Anna's number?

## 2021-07-22 NOTE — Telephone Encounter (Signed)
Good morning, Amanda,   I am just following up on this patient, our mutual patient [place], DOB 04/13/1927. We spoke on Tuesday, and I also spoke with Nira Conn who I understood was sending a note to Tammy Parrett to write a "daily productive cough for 6+ months" addendum. We just need documentation, as required by insurance, of a daily productive cough particularly with the 23-monthtimeline. This can be an addendum, or it can be another note (besides the 06/24/21 note which we have) from December 2022 or before mentioning productive cough/mucus/sputum. I am sure your office is bustling and finding time to review notes or write an addendum takes precious time. Unfortunately, I only have the note from 06/24/21. I would be more than happy to review other notes from 2022 or earlier to determine if they can meet this criteria, if they can please be faxed to [phone] Please let me know if you have any questions. Thank you so much for your help.   Thank you,   ADelano Metz

## 2021-07-22 NOTE — Telephone Encounter (Signed)
April 2023 note has been printed and faxed to Anna's attention.

## 2021-07-26 ENCOUNTER — Encounter: Payer: Self-pay | Admitting: Internal Medicine

## 2021-07-26 ENCOUNTER — Telehealth (INDEPENDENT_AMBULATORY_CARE_PROVIDER_SITE_OTHER): Payer: Medicare Other | Admitting: Adult Health

## 2021-07-26 ENCOUNTER — Other Ambulatory Visit: Payer: Self-pay | Admitting: Internal Medicine

## 2021-07-26 DIAGNOSIS — J479 Bronchiectasis, uncomplicated: Secondary | ICD-10-CM

## 2021-07-26 DIAGNOSIS — J449 Chronic obstructive pulmonary disease, unspecified: Secondary | ICD-10-CM

## 2021-07-26 NOTE — Patient Instructions (Addendum)
Liquid mucinex Twice daily   Hypertonic Neb daily in am .  Xopenex Neb daily in evening  Flutter valve after nebs Twice daily   Begin Vest when available.  High protein /calorie diet  CT chest as planned  Follow up with Dr. Vaughan Browner as planned and As needed   Please contact office for sooner follow up if symptoms do not improve or worsen or seek emergency care

## 2021-07-26 NOTE — Progress Notes (Signed)
Virtual Visit via Video Note  I connected with Julie Silva on 07/26/21 at  2:30 PM EDT by a video enabled telemedicine application and verified that I am speaking with the correct person using two identifiers.  Location: Patient: Home  Provider: Office    I discussed the limitations of evaluation and management by telemedicine and the availability of in person appointments. The patient expressed understanding and agreed to proceed.  History of Present Illness: 88 year old female never smoker followed for bronchiectasis, abnormal CT chest suspicious for underlying MAI, chronic respiratory failure on oxygen with activity and at bedtime Medical history significant for atrial fibs on amiodarone and Eliquis status post pacemaker History of chronic UTI previously on Macrodantin, does self urinary catheterizations followed by infectious disease at [facility] video visit is for an acute office visit.  Patient is accompanied by both of her daughters.  Patient complains that she continues to go downhill.  She was seen approximately 1 month ago with increased in symptoms with cough congestion.  She was given empiric antibiotics with Augmentin for 10 days .  Recommend to use hypertonic nebs once daily and Xopenex nebs once daily.  She also been recommended for vest therapy.  Patient did have decrease in cough and congestion that part has gotten better.  But she continues to feel very weak low energy.  Has a very low appetite.  She has been started on Marinol by primary care but has not seen any uptake in her appetite.  Family is trying to feed her every hour even throughout the night when she gets up.  They are trying to get in at least 2 boost a day.  She is very unhappy with her current eating situation as she does not want to eat every hour and she just does not have any appetite at all.  She denies any nausea vomiting or diarrhea.  No fevers.  She is awaiting the vest therapy to be delivered.  A  palliative care referral has been made but they have not met with palliative care yet.  We had a long discussion regarding goals of care and palliative care.  She has an upcoming CT chest in September.  Past Medical History:  Diagnosis Date   Anemia    Aortic atherosclerosis ([facility])    Arthritis    Osteoporosis, neck stiffness   Atrial fibrillation ([place])    Bleeds easily [facility])    "father was a hemophiliac" - pt not being bothered in recent years.   CAD (coronary artery disease)    Calcium deposit in bursa of knee 2017   Cancer ([place])    skin cancer "basal cell".   Chicken pox    Cyst, Baker's knee    left knee- tx. Cortisone injection 05-05-15 in office- "improved pain relief and swelling left ankle and knee"   Diverticulitis    Dysrhythmia    Dr. McAlhany-cardiology   GERD (gastroesophageal reflux disease)    Glaucoma of both eyes    Gout    Hearing loss of both ears    Bilateral ears- hearing aids used- right ear hearing betther than left.   Hiatal hernia    History of blood transfusion 1957   "lots; most related to my periods"-last 1968 -s/p Hysterectomy   Migraines    "years ago" (02/04/2013)   Motion sickness    back seat - cars, sudden movements   PAF (paroxysmal atrial fibrillation) ([place])    Pancreatic cyst    Pelvic floor  dysfunction    PONV (postoperative nausea and vomiting)    Risk for falls    "wobbley" per daughter   Self-catheterizes urinary bladder    "daily- retained urine and chronic UTI"   SVT (supraventricular tachycardia) ([facility])    Dr. Darnell Level. [place]-follows "loop recorder left chest"   Swallowing difficulty    freq occ. mostley liquids   Urine incontinence    UTI (urinary tract infection)    Chronic Urinary tract infections- tx Macrobid daily.   Vertigo    Wears hearing aid in both ears    Current Outpatient Medications on File Prior to Visit  Medication Sig Dispense Refill   acetaminophen (TYLENOL) 500 MG tablet Take 500 mg by mouth 2 (two) times daily.      amiodarone (PACERONE) 100 MG tablet Take 100 mg by mouth daily.     bimatoprost (LUMIGAN) 0.01 % SOLN Place 1 drop into both eyes at bedtime.     Catheters MISC Replace catheter twice daily. 180 each 1   cefdinir (OMNICEF) 250 MG/5ML suspension Take 250 mg by mouth daily.     dorzolamide-timolol (COSOPT) 22.3-6.8 MG/ML ophthalmic solution Place 1 drop into both eyes 2 (two) times daily.     dronabinol (MARINOL) 2.5 MG capsule Take 1 capsule (2.5 mg total) by mouth 2 (two) times daily before lunch and supper. 60 capsule 0   ELIQUIS 2.5 MG TABS tablet TAKE 1 TABLET TWICE A DAY 180 tablet 0   estradiol (ESTRACE) 0.1 MG/GM vaginal cream Place 1 Applicatorful vaginally at bedtime.     levalbuterol (XOPENEX) 0.63 MG/3ML nebulizer solution Take 3 mLs (0.63 mg total) by nebulization daily. 90 mL 5   omeprazole (PRILOSEC) 20 MG capsule TAKE 1 CAPSULE BY MOUTH EVERY DAY 90 capsule 1   sodium chloride HYPERTONIC 3 % nebulizer solution Take by nebulization 2 (two) times daily as needed for other. 750 mL 12   Wheat Dextrin (BENEFIBER PO) Take 1 mg by mouth daily.     No current facility-administered medications on file prior to visit.      Observations/Objective: High-resolution CT chest June 10, 2021 very extensive, new dense consolidation and clustered nodularity through the dependent right lower lobe.  No significant change in the thick walled cavitary lesion in the right lower lobe measuring 2.9 x 2.1 cm.  No significant change in thick-walled cavitary lesion in the left lower lobe measuring 1.5 x 1.1 cm.  Unchanged dense consolidation and fibrosis of the right middle lobe.  Assessment and Plan: Bronchiectasis-recent flare with improvement in cough and congestion with antibiotics.    Follow Up Instructions:    I discussed the assessment and treatment plan with the patient. The patient was provided an opportunity to ask questions and all were answered. The patient agreed with the plan and  demonstrated an understanding of the instructions.   The patient was advised to call back or seek an in-person evaluation if the symptoms worsen or if the condition fails to improve as anticipated.  I provided *** minutes of non-face-to-face time during this encounter.   Rexene Edison, NP

## 2021-07-26 NOTE — Telephone Encounter (Signed)
[phone] 

## 2021-07-27 ENCOUNTER — Other Ambulatory Visit: Payer: Self-pay | Admitting: Internal Medicine

## 2021-07-27 DIAGNOSIS — J449 Chronic obstructive pulmonary disease, unspecified: Secondary | ICD-10-CM

## 2021-07-27 MED ORDER — DRONABINOL 5 MG PO CAPS: 5.0000 mg | ORAL_CAPSULE | Freq: Two times a day (BID) | ORAL | 0 refills | Status: DC

## 2021-07-28 ENCOUNTER — Other Ambulatory Visit: Payer: Medicare Other

## 2021-07-29 NOTE — Telephone Encounter (Signed)
April 2023 note has been printed and faxed to Anna's attention.

## 2021-07-30 ENCOUNTER — Telehealth: Payer: Self-pay

## 2021-07-30 NOTE — Telephone Encounter (Signed)
Attempted to contact patient's daughter [name] to schedule a Palliative Care consult appointment. No answer left a message to return call.

## 2021-07-30 NOTE — Telephone Encounter (Signed)
Spoke with patient's daughter [name] and scheduled a Mychart Palliative Consult for 08/02/21 @ 2:30 PM.  Consent obtained; updated Netsmart, Team List and Epic.

## 2021-08-02 ENCOUNTER — Telehealth: Payer: Medicare Other | Admitting: Hospice

## 2021-08-02 ENCOUNTER — Encounter: Payer: Self-pay | Admitting: Oncology

## 2021-08-02 DIAGNOSIS — I4891 Unspecified atrial fibrillation: Secondary | ICD-10-CM

## 2021-08-02 DIAGNOSIS — Z515 Encounter for palliative care: Secondary | ICD-10-CM | POA: Diagnosis not present

## 2021-08-02 DIAGNOSIS — R634 Abnormal weight loss: Secondary | ICD-10-CM

## 2021-08-02 DIAGNOSIS — J479 Bronchiectasis, uncomplicated: Secondary | ICD-10-CM | POA: Diagnosis not present

## 2021-08-02 NOTE — Progress Notes (Signed)
[facility] Consult Note Telephone: [phone]  Fax: [phone]  PATIENT NAME: Julie Silva [address] [place] [address] [phone] (home)  DOB: 1927-10-21 MRN: [id number]  PRIMARY CARE PROVIDER:    Janith Lima, MD,  [place] [address] [phone]  REFERRING PROVIDER:   Janith Lima, MD [place],  [place] [address] [phone]  RESPONSIBLE PARTY:    Contact Information     Name Relation Home Work Mobile   [place] Daughter [phone]  [phone]   Dennie Fetters Daughter   [phone]       TELEHEALTH VISIT STATEMENT Due to the COVID-19 crisis, this visit was done via telemedicine from my office and it was initiated and consent by this patient and or family.  I connected with patient OR PROXY by a telephone/video  and verified that I am speaking with the correct person. I discussed the limitations of evaluation and management by telemedicine. The patient expressed understanding and agreed to proceed. Palliative Care was asked to follow this patient to address advance care planning, complex medical decision making and goals of care clarification. Julie Silva is at home with patient. This is the initial visit.     ASSESSMENT AND / RECOMMENDATIONS:   Advance Care Planning: Our advance care planning conversation included a discussion about:    The value and importance of advance care planning  Difference between Hospice and Palliative care Exploration of goals of care in the event of a sudden injury or illness  Identification and preparation of a healthcare agent  Review and updating or creation of an  advance directive document . Decision not to resuscitate or to de-escalate disease focused treatments due to poor prognosis.  CODE STATUS: Patient is a Do Not Resuscitate  Goals of Care: Goals include to maximize quality of life and symptom management.  Family  interested in knowing the prospect and benefits of hospice service versus palliative service.  Questions answered to satisfaction.  I spent 16 minutes providing this initial consultation. More than 50% of the time in this consultation was spent on counseling patient and coordinating communication. --------------------------------------------------------------------------------------------------------------------------------------  Symptom Management/Plan: Bronchiectasis: Continue Xopenex as ordered. Could not tolerate vibrating vest. Continue Mucinex to help thin mucus/cough.  Follow-up with pulmonologist as planned. Weight loss/poor appetite: Current weight 85 Ibs down from 98 pounds 6 months ago, Height 5 feet 3 inches. Continue Marinol for appetite, recently increased to 5 mg BID.  Encourage 5-6 small meals of choice and provide assistance as needed to ensure adequate oral intake.  Ensure twice daily.  Monitor weight closely and report further weight loss.  Check CBC CMP. Afib: Eliquis, Amiodarone Follow up: Palliative care will continue to follow for complex medical decision making, advance care planning, and clarification of goals. [facility] RN/SW to follow up in 2 weeks. Encouraged to call provider sooner with any concerns.   Family /Caregiver/Community Supports: Patient lives with daughter Who is her caregiver.  HOSPICE ELIGIBILITY/DIAGNOSIS: TBD  Chief Complaint: Initial Palliative care visit  HISTORY OF PRESENT ILLNESS:  Julie Silva is a 88 y.o. year old female  with multiple morbidities requiring close monitoring and with high risk of complications and  mortality: bronchiectasis, abnormal CT chest suspicious for underlying MAI, chronic respiratory failure on oxygen with activity and at bedtime. History of Afib, chronic UTI.  History obtained from review of EMR, discussion with primary team, caregiver, family and/or Julie Silva.  Review and summarization of Epic  records shows history from  other than patient. Rest of 10 point ROS asked and negative.  Independent interpretation of tests and reviewed as needed, available labs, patient records, imaging, studies and related documents from the EMR.  PAST MEDICAL HISTORY:  Active Ambulatory Problems    Diagnosis Date Noted   Urinary retention 02/03/2013   Osteopenia 02/03/2013   SVT (supraventricular tachycardia) ([facility]) 04/16/2013   Essential hypertension, benign 05/07/2013   Hyperlipidemia with target LDL less than 130 05/07/2013   Gastroesophageal reflux disease with esophagitis 07/31/2013   Dysphagia 97/67/3419   Diastolic dysfunction 37/90/2409   Second degree AV block 03/25/2014   Intraductal papillary adenocarcinoma of female breast 04/29/2015   Abnormal chest x-ray with multiple lung nodules 04/29/2015   Abnormal CT scan, chest 10/06/2015   Pseudogout involving multiple joints 12/11/2018   Atherosclerosis of aorta ([facility]) 07/31/2020   Sleep walking disorder 03/30/2021   Restless sleeper 04/13/2021   Bronchiectasis ([facility]) 04/13/2021   A-fib ([facility]) 04/13/2021   Iron deficiency anemia 04/14/2021   Malignant cachexia ([facility]) 06/22/2021   Pressure injury of sacral region, stage 1 06/22/2021   Pulmonary cachexia due to COPD ([facility]) 06/22/2021   Chronic respiratory failure with hypoxia ([facility]) 06/24/2021   Encounter for palliative care involving management of pain 07/21/2021   Resolved Ambulatory Problems    Diagnosis Date Noted   Renal failure 02/03/2013   AKI (acute kidney injury) ([facility]) 02/03/2013   Weight loss 02/03/2013   Recurrent cystitis 02/03/2013   Family history of breast cancer 02/03/2013   Family history of ovarian cancer 02/03/2013   Family history of hemophilia 02/03/2013   Right-sided chest wall pain 02/03/2013   Left-sided thoracic back pain 04/02/2013   Other abnormal glucose 05/07/2013   Routine general medical examination at a health care facility 05/07/2013   High T4 syndrome 05/08/2013   Injury of hip,  right 06/28/2013   Transient vision disturbance 03/25/2014   Visual disturbance 04/30/2014   Palpitations 06/19/2014   Weight loss, unintentional 04/09/2015   Cough 04/09/2015   Breast mass, left 04/09/2015   Breast mass, right 04/09/2015   CAP (community acquired pneumonia) 04/10/2015   Baker's cyst of knee 04/29/2015   Pain and swelling of left lower leg 04/29/2015   Intraductal papilloma of right breast 05/12/2015   Pleuritic chest pain 04/04/2016   Acute cystitis with hematuria 08/03/2016   Acute idiopathic gout 02/02/2017   Acute epigastric pain 02/06/2017   E. coli UTI (urinary tract infection) 11/09/2017   Sensation of chest pressure 12/11/2018   Cough 12/11/2018   Acute foot pain, left 06/12/2019   Cachexia ([facility]) 07/30/2020   Vaginal discharge 07/30/2020   Right hip pain 08/24/2020   Enterococcus UTI 11/16/2020   Acute cystitis without hematuria 12/19/2020   DOE (dyspnea on exertion) 03/30/2021   Deficiency anemia 03/30/2021   Respiratory failure with hypoxia ([facility]) 04/13/2021   Acute respiratory failure with hypoxia ([facility]) 04/13/2021   UTI (urinary tract infection) 05/12/2021   Past Medical History:  Diagnosis Date   Anemia    Aortic atherosclerosis (H[facility])    Arthritis    Atrial fibrillation ([facility])    Bleeds easily ([facility])    CAD (coronary artery disease)    Calcium deposit in bursa of knee 2017   Cancer ([facility])    Chicken pox    Cyst, Baker's knee    Diverticulitis    Dysrhythmia    GERD (gastroesophageal reflux disease)    Glaucoma of both eyes    Gout  Hearing loss of both ears    Hiatal hernia    History of blood transfusion 1957   Migraines    Motion sickness    PAF (paroxysmal atrial fibrillation) (H[facility])    Pancreatic cyst    Pelvic floor dysfunction    PONV (postoperative nausea and vomiting)    Risk for falls    Self-catheterizes urinary bladder    Swallowing difficulty    Urine incontinence    Vertigo    Wears hearing aid in both ears      SOCIAL HX:  Social History   Tobacco Use   Smoking status: Never    Passive exposure: Past   Smokeless tobacco: Never  Substance Use Topics   Alcohol use: No    Alcohol/week: 0.0 standard drinks of alcohol     FAMILY HX:  Family History  Problem Relation Age of Onset   Stroke Father    CVA Father    Ovarian cancer Mother    Cancer - Other Sister        Breast   Breast cancer Sister    Cancer - Other Brother        Throat   Cancer - Other Sister        Throat   Brain cancer Sister    Cancer - Other Other        Brain   Breast cancer Other       ALLERGIES:  Allergies  Allergen Reactions   Codeine Other (See Comments)    Passed out   Colchicine Diarrhea      PERTINENT MEDICATIONS:  Outpatient Encounter Medications as of 08/02/2021  Medication Sig   acetaminophen (TYLENOL) 500 MG tablet Take 500 mg by mouth 2 (two) times daily.   amiodarone (PACERONE) 100 MG tablet Take 100 mg by mouth daily.   bimatoprost (LUMIGAN) 0.01 % SOLN Place 1 drop into both eyes at bedtime.   Catheters MISC Replace catheter twice daily.   cefdinir (OMNICEF) 250 MG/5ML suspension Take 250 mg by mouth daily.   dorzolamide-timolol (COSOPT) 22.3-6.8 MG/ML ophthalmic solution Place 1 drop into both eyes 2 (two) times daily.   dronabinol (MARINOL) 5 MG capsule Take 1 capsule (5 mg total) by mouth 2 (two) times daily before a meal.   ELIQUIS 2.5 MG TABS tablet TAKE 1 TABLET TWICE A DAY   estradiol (ESTRACE) 0.1 MG/GM vaginal cream Place 1 Applicatorful vaginally at bedtime.   levalbuterol (XOPENEX) 0.63 MG/3ML nebulizer solution Take 3 mLs (0.63 mg total) by nebulization daily.   omeprazole (PRILOSEC) 20 MG capsule TAKE 1 CAPSULE BY MOUTH EVERY DAY   sodium chloride HYPERTONIC 3 % nebulizer solution Take by nebulization 2 (two) times daily as needed for other.   Wheat Dextrin (BENEFIBER PO) Take 1 mg by mouth daily.   No facility-administered encounter medications on file as of 08/02/2021.      Thank you for the opportunity to participate in the care of Ms. Tindel.  The palliative care team will continue to follow. Please call our office at [phone] if we can be of additional assistance.   Note: Portions of this note were generated with Lobbyist. Dictation errors may occur despite best attempts at proofreading.  Teodoro Spray, NP

## 2021-08-02 NOTE — Telephone Encounter (Signed)
Pt daughter called requesting an update on the PA for the medication dronabinol (MARINOL) 5 MG capsule.  Pt daughter was going to reach out to insurance to see if there is an alternate medication.

## 2021-08-04 ENCOUNTER — Other Ambulatory Visit: Payer: Self-pay | Admitting: Internal Medicine

## 2021-08-04 ENCOUNTER — Ambulatory Visit: Payer: Medicare Other | Admitting: Primary Care

## 2021-08-04 DIAGNOSIS — J449 Chronic obstructive pulmonary disease, unspecified: Secondary | ICD-10-CM

## 2021-08-04 DIAGNOSIS — L89151 Pressure ulcer of sacral region, stage 1: Secondary | ICD-10-CM

## 2021-08-04 DIAGNOSIS — R64 Cachexia: Secondary | ICD-10-CM

## 2021-08-06 ENCOUNTER — Telehealth: Payer: Self-pay | Admitting: Internal Medicine

## 2021-08-06 NOTE — Telephone Encounter (Signed)
Spoke with nurse at [facility] to have her inform [facility] that Dr. Ronnald Ramp is wanting to proceed with Hospice not Palliative care. I was able to leave the clinic information if she needs to contact the office for any further information to call on Monday.

## 2021-08-06 NOTE — Telephone Encounter (Signed)
[place] with [facility] calls today needing clarification on how to proceed with PT's care. [place] Mam states she has received both a referral for hospice care as well a referral for palliative care. [place] Mam was wanting to confirm with Dr.Jones as to which route he was wanting to take.  CB: [phone]

## 2021-08-17 ENCOUNTER — Other Ambulatory Visit: Payer: Self-pay

## 2021-08-17 ENCOUNTER — Emergency Department (HOSPITAL_COMMUNITY)
Admission: EM | Admit: 2021-08-17 | Discharge: 2021-08-17 | Disposition: A | Discharge status: Home / Self Care | Payer: Medicare Other | Attending: Emergency Medicine | Admitting: Emergency Medicine

## 2021-08-17 ENCOUNTER — Encounter (HOSPITAL_COMMUNITY): Payer: Self-pay

## 2021-08-17 ENCOUNTER — Emergency Department (HOSPITAL_COMMUNITY): Payer: Medicare Other

## 2021-08-17 ENCOUNTER — Other Ambulatory Visit: Payer: Self-pay | Admitting: Internal Medicine

## 2021-08-17 ENCOUNTER — Encounter: Payer: Self-pay | Admitting: Internal Medicine

## 2021-08-17 DIAGNOSIS — R1030 Lower abdominal pain, unspecified: Secondary | ICD-10-CM | POA: Diagnosis not present

## 2021-08-17 DIAGNOSIS — Z79899 Other long term (current) drug therapy: Secondary | ICD-10-CM | POA: Insufficient documentation

## 2021-08-17 DIAGNOSIS — R110 Nausea: Secondary | ICD-10-CM | POA: Diagnosis not present

## 2021-08-17 DIAGNOSIS — R9431 Abnormal electrocardiogram [ECG] [EKG]: Secondary | ICD-10-CM | POA: Diagnosis not present

## 2021-08-17 DIAGNOSIS — J449 Chronic obstructive pulmonary disease, unspecified: Secondary | ICD-10-CM | POA: Diagnosis not present

## 2021-08-17 DIAGNOSIS — Z7901 Long term (current) use of anticoagulants: Secondary | ICD-10-CM | POA: Insufficient documentation

## 2021-08-17 DIAGNOSIS — R627 Adult failure to thrive: Secondary | ICD-10-CM | POA: Diagnosis not present

## 2021-08-17 DIAGNOSIS — K5730 Diverticulosis of large intestine without perforation or abscess without bleeding: Secondary | ICD-10-CM | POA: Diagnosis not present

## 2021-08-17 DIAGNOSIS — R918 Other nonspecific abnormal finding of lung field: Secondary | ICD-10-CM | POA: Diagnosis not present

## 2021-08-17 DIAGNOSIS — N281 Cyst of kidney, acquired: Secondary | ICD-10-CM | POA: Diagnosis not present

## 2021-08-17 DIAGNOSIS — R109 Unspecified abdominal pain: Secondary | ICD-10-CM | POA: Diagnosis not present

## 2021-08-17 DIAGNOSIS — I10 Essential (primary) hypertension: Secondary | ICD-10-CM | POA: Diagnosis not present

## 2021-08-17 LAB — CBC
HCT: 37.4 % (ref 36.0–46.0)
Hemoglobin: 11.9 g/dL — ABNORMAL LOW (ref 12.0–15.0)
MCH: 30.7 pg (ref 26.0–34.0)
MCHC: 31.8 g/dL (ref 30.0–36.0)
MCV: 96.6 fL (ref 80.0–100.0)
Platelets: 300 10*3/uL (ref 150–400)
RBC: 3.87 MIL/uL (ref 3.87–5.11)
RDW: 15.2 % (ref 11.5–15.5)
WBC: 10.6 10*3/uL — ABNORMAL HIGH (ref 4.0–10.5)
nRBC: 0 % (ref 0.0–0.2)

## 2021-08-17 LAB — URINALYSIS, ROUTINE W REFLEX MICROSCOPIC
Bilirubin Urine: NEGATIVE
Glucose, UA: NEGATIVE mg/dL
Hgb urine dipstick: NEGATIVE
Ketones, ur: NEGATIVE mg/dL
Nitrite: NEGATIVE
Protein, ur: NEGATIVE mg/dL
Specific Gravity, Urine: 1.015 (ref 1.005–1.030)
pH: 8 (ref 5.0–8.0)

## 2021-08-17 LAB — COMPREHENSIVE METABOLIC PANEL
ALT: 21 U/L (ref 0–44)
AST: 19 U/L (ref 15–41)
Albumin: 3.5 g/dL (ref 3.5–5.0)
Alkaline Phosphatase: 77 U/L (ref 38–126)
Anion gap: 10 (ref 5–15)
BUN: 31 mg/dL — ABNORMAL HIGH (ref 8–23)
CO2: 22 mmol/L (ref 22–32)
Calcium: 9 mg/dL (ref 8.9–10.3)
Chloride: 107 mmol/L (ref 98–111)
Creatinine, Ser: 0.78 mg/dL (ref 0.44–1.00)
GFR, Estimated: >60 mL/min (ref >60)
Glucose, Bld: 106 mg/dL — ABNORMAL HIGH (ref 70–99)
Potassium: 3.8 mmol/L (ref 3.5–5.1)
Sodium: 139 mmol/L (ref 135–145)
Total Bilirubin: 0.6 mg/dL (ref 0.3–1.2)
Total Protein: 7.4 g/dL (ref 6.5–8.1)

## 2021-08-17 LAB — TROPONIN I (HIGH SENSITIVITY): Troponin I (High Sensitivity): 7 ng/L (ref <18)

## 2021-08-17 LAB — LIPASE, BLOOD: Lipase: 41 U/L (ref 11–51)

## 2021-08-17 MED ORDER — ONDANSETRON HCL 4 MG/2ML IJ SOLN
4.0000 mg | Freq: Once | INTRAMUSCULAR | Status: AC
  Administered 2021-08-17: 4 mg via INTRAVENOUS
  Filled 2021-08-17: qty 2

## 2021-08-17 MED ORDER — ONDANSETRON 4 MG PO TBDP: 4.0000 mg | ORAL_TABLET | Freq: Three times a day (TID) | ORAL | 0 refills | Status: DC | PRN

## 2021-08-17 MED ORDER — IOHEXOL 300 MG/ML  SOLN
100.0000 mL | Freq: Once | INTRAMUSCULAR | Status: AC | PRN
Start: 2021-08-17 — End: 2021-08-17
  Administered 2021-08-17: 80 mL via INTRAVENOUS

## 2021-08-17 MED ORDER — SODIUM CHLORIDE 0.9 % IV BOLUS
250.0000 mL | Freq: Once | INTRAVENOUS | Status: AC
  Administered 2021-08-17: 250 mL via INTRAVENOUS

## 2021-08-17 MED ORDER — SODIUM CHLORIDE 0.9 % IV BOLUS: 500.0000 mL | Freq: Once | INTRAVENOUS | Status: DC

## 2021-08-17 NOTE — Discharge Instructions (Addendum)
Your work-up today was overall reassuring.  Blood work did not show any concerning findings.  Your urine showed some haziness, and bacteria however this is chronic for you given your colonization.  You do not have any signs of UTI.  Your CT scan also showed some consolidation in the right lower lung however this is improved from June and you do not have any signs or symptoms of pneumonia.  We discussed admission for failure to thrive and weakness however you would like to go home with nausea medication and see if you can improve your intake and follow-up with your PCP.  If you have any worsening symptoms please return to the emergency room.

## 2021-08-17 NOTE — ED Provider Notes (Signed)
Signout received on this 88 year old female who presented with abdominal pain, nausea going on for about a week.  Work-up so far demonstrates CBC with mild leukocytosis, mild anemia, CMP with mildly elevated BUN of 31 otherwise preserved renal function, normal electrolytes, normal LFTs.  Lipase within normal limits.  UA with small amount of leukocytes, hazy appearance and few bacteria.  Patient self caths, and has chronic colonization.  She is without UTI symptoms.  At the time of signout she is awaiting troponin to rule out ACS, CT abdomen pelvis to rule out concerning cause of her abdominal pain, nausea.  She has been given a repeat dose of Zofran.  Will reevaluate. Physical Exam  BP (!) 126/96   Pulse 65   Temp 98 F (36.7 C) (Oral)   Resp 17   SpO2 100%     Procedures  Procedures  ED Course / MDM    Medical Decision Making Amount and/or Complexity of Data Reviewed Labs: ordered. Radiology: ordered.  Risk Prescription drug management.   CT abdomen pelvis without evidence of concerning or acute cause of abdominal pain.  CT abdomen pelvis does note a right lower lung area of consolidation.  This has improved from CT scan that was performed in June.  Patient has history of MAC but this has not been definitively diagnosed per family.  However they state after discussion with pulmonologist given patient's age and their concern for her not to tolerate the triple antibiotic regimen they have deferred treatment.  They deny fever, productive cough, or other signs of pneumonia or respiratory infection.  Also denying any UTI symptoms.  Patient was able to ambulate to bedside commode while in the emergency room.  Troponin of 7.  Given no acute ischemic changes on EKG doubt ACS.  I did offer patient admission for failure to thrive.  They defer this and would like to go home with antiemetic and follow-up with PCP.  Strict return precautions discussed.  They voiced understanding and are in agreement  with plan.  Cystic pancreatic lesions noted on CT abdomen pelvis.  These are chronic and have been present since 2011.  They have been stable since recent scan from 2022.  Family notified of this.    Evlyn Courier, PA-C 08/17/21 1847    Dorie Rank, MD 08/18/21 [phone]

## 2021-08-17 NOTE — ED Provider Triage Note (Signed)
Emergency Medicine Provider Triage Evaluation Note  Julie Silva , a 88 y.o. female  was evaluated in triage.  Pt complains of nausea, awaiting hospice eval next week. Brought in by EMS from home. Denies abdominal pain, generally feeling poor onset 5am, took '8mg'$  zofran without relief. Decreased PO intake.  Small bowel movement this morning.  Review of Systems  Positive: Nausea, tremor, weakness Negative: fever  Physical Exam  BP 135/73 (BP Location: Left Arm)   Pulse 73   Temp 97.8 F (36.6 C) (Oral)   Resp 18   SpO2 96%  Gen:   Awake, no distress   Resp:  Normal effort  MSK:   Moves extremities without difficulty  Other:    Medical Decision Making  Medically screening exam initiated at 11:44 AM.  Appropriate orders placed.  Khamil S Matuszak was informed that the remainder of the evaluation will be completed by another provider, this initial triage assessment does not replace that evaluation, and the importance of remaining in the ED until their evaluation is complete.     Tacy Learn, PA-C 08/17/21 1149

## 2021-08-17 NOTE — ED Provider Notes (Signed)
[facility] DEPT Provider Note   CSN: [id number] Arrival date & time: 08/17/21  1119     History  Chief Complaint  Patient presents with   Abdominal Pain    Julie Silva is a 88 y.o. female.  88 year old female brought in by daughters from home with concern for extreme nausea and feeling generally poor this morning.  Symptoms started on 8 AM, took 8 mg of Zofran without relief.  Patient arrives in the emergency room and states that she feels better at this time, does report some lower abdominal discomfort.  Family reports poor p.o. intake, concerned patient may be constipated as she has to be disimpacted regularly.  Last stool output was this morning, noted to be small amount and hard, no stool output yesterday. Patient is scheduled to meet with palliative care next week. Family also notes that patient self caths, is colonized and they do not treat unless she is symptomatic, do not feel she is symptomatic at this time, is on prophylactic antibiotics.       Home Medications Prior to Admission medications   Medication Sig Start Date End Date Taking? Authorizing Provider  acetaminophen (TYLENOL) 500 MG tablet Take 500 mg by mouth 2 (two) times daily.    [provider]  amiodarone (PACERONE) 100 MG tablet Take 100 mg by mouth daily.    [provider]  bimatoprost (LUMIGAN) 0.01 % SOLN Place 1 drop into both eyes at bedtime.    [provider]  Catheters MISC Replace catheter twice daily. 07/12/17   Janith Lima, MD  cefdinir (OMNICEF) 250 MG/5ML suspension Take 250 mg by mouth daily. 03/13/21   [provider]  dorzolamide-timolol (COSOPT) 22.3-6.8 MG/ML ophthalmic solution Place 1 drop into both eyes 2 (two) times daily.    [provider]  dronabinol (MARINOL) 5 MG capsule Take 1 capsule (5 mg total) by mouth 2 (two) times daily before a meal. 07/27/21   Janith Lima, MD  ELIQUIS 2.5 MG TABS tablet TAKE  1 TABLET TWICE A DAY 04/23/19   Evans Lance, MD  estradiol (ESTRACE) 0.1 MG/GM vaginal cream Place 1 Applicatorful vaginally at bedtime.    [provider]  levalbuterol Penne Lash) 0.63 MG/3ML nebulizer solution Take 3 mLs (0.63 mg total) by nebulization daily. 06/24/21   Parrett, Fonnie Mu, NP  omeprazole (PRILOSEC) 20 MG capsule TAKE 1 CAPSULE BY MOUTH EVERY DAY 03/30/21   Janith Lima, MD  sodium chloride HYPERTONIC 3 % nebulizer solution Take by nebulization 2 (two) times daily as needed for other. 06/24/21   Martyn Ehrich, NP  Wheat Dextrin (BENEFIBER PO) Take 1 mg by mouth daily.    [provider]      Allergies    Codeine and Colchicine    Review of Systems   Review of Systems Negative except as per HPI Physical Exam Updated Vital Signs BP (!) 149/60   Pulse 67   Temp 97.8 F (36.6 C) (Oral)   Resp 17   SpO2 100%  Physical Exam Vitals and nursing note reviewed.  Constitutional:      General: She is not in acute distress.    Appearance: She is not ill-appearing or toxic-appearing.  HENT:     Head: Normocephalic and atraumatic.  Cardiovascular:     Rate and Rhythm: Normal rate and regular rhythm.     Pulses: Normal pulses.     Heart sounds: Normal heart sounds.  Pulmonary:  Effort: Pulmonary effort is normal.     Breath sounds: Normal breath sounds.  Abdominal:     General: Bowel sounds are normal.     Palpations: Abdomen is soft.     Tenderness: There is no abdominal tenderness.  Musculoskeletal:     Right lower leg: No edema.     Left lower leg: No edema.  Skin:    General: Skin is warm and dry.     Findings: No erythema or rash.  Neurological:     General: No focal deficit present.     Mental Status: She is alert.  Psychiatric:        Behavior: Behavior normal.     ED Results / Procedures / Treatments   Labs (all labs ordered are listed, but only abnormal results are displayed) Labs Reviewed  COMPREHENSIVE METABOLIC PANEL -  Abnormal; Notable for the following components:      Result Value   Glucose, Bld 106 (*)    BUN 31 (*)    All other components within normal limits  CBC - Abnormal; Notable for the following components:   WBC 10.6 (*)    Hemoglobin 11.9 (*)    All other components within normal limits  URINALYSIS, ROUTINE W REFLEX MICROSCOPIC - Abnormal; Notable for the following components:   APPearance HAZY (*)    Leukocytes,Ua SMALL (*)    Bacteria, UA FEW (*)    All other components within normal limits  LIPASE, BLOOD  TROPONIN I (HIGH SENSITIVITY)    EKG EKG Interpretation  Date/Time:  Tuesday August 17 2021 12:36:22 EDT Ventricular Rate:  72 PR Interval:  188 QRS Duration: 86 QT Interval:  422 QTC Calculation: 462 R Axis:   -66 Text Interpretation: Sinus rhythm LAD, consider left anterior fascicular block Anterior infarct, old Confirmed by Octaviano Glow [phone]) on 08/17/2021 1:27:50 PM  Radiology DG Abd Acute W/Chest  Result Date: 08/17/2021 CLINICAL DATA:  Abdominal pain, nausea EXAM: DG ABDOMEN ACUTE WITH 1 VIEW CHEST COMPARISON:  Chest radiographs done on 04/04/2016 and CT done on 11/09/2020 FINDINGS: Bowel gas pattern is nonspecific. No abnormal masses or calcifications are seen. Kidneys are partly obscured by bowel contents. Surgical clips are seen in right upper quadrant. Low position of diaphragms suggests COPD. Evaluation of lung fields is limited by less than optimal technique. There are patchy infiltrates in right lower lung field and right parahilar region. There is 1.7 cm structure with central lucency in the right parahilar region which has decreased in size. There are small linear densities in left parahilar region. There is no significant pleural effusion or pneumothorax. IMPRESSION: Nonspecific bowel gas pattern. No abnormal masses or calcifications are seen in abdomen. COPD. Patchy infiltrate in right lower lung field may suggest atelectasis/pneumonia. Part of this finding may  suggest underlying scarring. There is interval decrease in size of a cavitary lesion in right parahilar region. Evaluation of lung fields is limited by less than optimal technique. Electronically Signed   By: Elmer Picker M.D.   On: 08/17/2021 14:36    Procedures Procedures    Medications Ordered in ED Medications  ondansetron (ZOFRAN) injection 4 mg (has no administration in time range)  sodium chloride 0.9 % bolus 250 mL (250 mLs Intravenous New Bag/Given 08/17/21 1336)    ED Course/ Medical Decision Making/ A&P                           Medical Decision Making Amount and/or  Complexity of Data Reviewed Labs: ordered. Radiology: ordered.  Risk Prescription drug management.   This patient presents to the ED for concern of nausea, lower abdominal discomfort, this involves an extensive number of treatment options, and is a complaint that carries with it a high risk of complications and morbidity.  The differential diagnosis includes but not limited to constipation, dehydration, metabolic abnormality    Co morbidities that complicate the patient evaluation  Urinary retention, SVT, hypertension, diastolic dysfunction (no recent echo on file), A-fib (on Eliquis), pulmonary cachexia due to COPD, MAI concern of chest CT   Additional history obtained:  Additional history obtained from daughters at bedside who assist with history as above External records from outside source obtained and reviewed including recent progress note from palliative care from 08/02/2021.   Lab Tests:  I Ordered, and personally interpreted labs.  The pertinent results include: CBC, CMP without significant findings.  Lipase normal.   Imaging Studies ordered:  I ordered imaging studies including acute abdominal series I independently visualized and interpreted imaging which showed no significant stool burden I agree with the radiologist interpretation   Cardiac Monitoring: / EKG:  The patient  was maintained on a cardiac monitor.  I personally viewed and interpreted the cardiac monitored which showed an underlying rhythm of: Sinus rhythm, rate 72   Consultations Obtained:  I requested consultation with the ER attending, Dr. Langston Masker,  and discussed lab and imaging findings as well as pertinent plan - they recommend: CT pelvis, troponin, Zofran   Problem List / ED Course / Critical interventions / Medication management  Feeling poorly with extreme nausea earlier today which is since resolved.  Reports lower abdominal discomfort.  He is concerned patient may be constipated as she has to be disimpacted regularly, offered rectal exam to evaluate versus x-ray.  Family would like to proceed with abdominal x-ray to assess stool burden.  Also concerned patient may be dehydrated due to her poor oral intake.  Agreeable with IV fluids.  History of diastolic dysfunction, no recent echo on file, will give small fluid bolus IV while in the ER. On recheck, patient states that she just does not feel well, is feeling nauseous again.  Discussed with ER attending Dr. Langston Masker, recommends CT abdomen pelvis with contrast, troponin and additional Zofran (has not had any since early this morning.  If troponin is undetectable, do not need repeat and can discharge if CT without acute findings. I ordered medication including Zofran for nausea Reevaluation of the patient after these medicines showed that the patient  reassessment pending at time of signout I have reviewed the patients home medicines and have made adjustments as needed   Social Determinants of Health:  Lives with family   Test / Admission - Considered:  Final disposition pending at time of signout to oncoming provider pending troponin, Zofran, CT         Final Clinical Impression(s) / ED Diagnoses Final diagnoses:  Nausea    Rx / DC Orders ED Discharge Orders     None         Tacy Learn, PA-C 08/17/21 1514     Wyvonnia Dusky, MD 08/17/21 1525

## 2021-08-17 NOTE — ED Triage Notes (Signed)
Pt BIB EMS from home. Pt endorses nausea and abdominal pain since 5am this morning. Pt reports taking a total of '8mg'$  Zofran without relief. Pt also reports not eating or drinking anything over the past few days. A&O x4.   BP 132/72 HR 68 97% RA

## 2021-08-17 NOTE — ED Notes (Signed)
Pt states understanding of dc instructions, importance of follow up, and prescription. Pt denies questions or concerns upon dc. Pt declined wheelchair assistance upon dc. Pt ambulated out of ed w/ steady gait. No belongings left in room upon dc.  

## 2021-08-18 ENCOUNTER — Telehealth: Payer: Self-pay | Admitting: Internal Medicine

## 2021-08-18 NOTE — Telephone Encounter (Signed)
Chakyra called and advised that the family of Julie Silva is requesting that Dr. Ronnald Ramp serves as hospice attending. She stated if Dr. Ronnald Ramp does not want to manage comfort care [facility] physician is able to do it. [name] is also requesting that Dr. Ronnald Ramp provides an oral CTI, which states that the pt is terminally ill.   Please advise   CB: [phone]

## 2021-08-19 DIAGNOSIS — E43 Unspecified severe protein-calorie malnutrition: Secondary | ICD-10-CM | POA: Diagnosis not present

## 2021-08-19 DIAGNOSIS — Z8709 Personal history of other diseases of the respiratory system: Secondary | ICD-10-CM | POA: Diagnosis not present

## 2021-08-19 DIAGNOSIS — R42 Dizziness and giddiness: Secondary | ICD-10-CM | POA: Diagnosis not present

## 2021-08-19 DIAGNOSIS — A310 Pulmonary mycobacterial infection: Secondary | ICD-10-CM | POA: Diagnosis not present

## 2021-08-19 DIAGNOSIS — D649 Anemia, unspecified: Secondary | ICD-10-CM | POA: Diagnosis not present

## 2021-08-19 DIAGNOSIS — I2510 Atherosclerotic heart disease of native coronary artery without angina pectoris: Secondary | ICD-10-CM | POA: Diagnosis not present

## 2021-08-19 DIAGNOSIS — R634 Abnormal weight loss: Secondary | ICD-10-CM | POA: Diagnosis not present

## 2021-08-19 DIAGNOSIS — I495 Sick sinus syndrome: Secondary | ICD-10-CM | POA: Diagnosis not present

## 2021-08-19 DIAGNOSIS — F03A0 Unspecified dementia, mild, without behavioral disturbance, psychotic disturbance, mood disturbance, and anxiety: Secondary | ICD-10-CM | POA: Diagnosis not present

## 2021-08-19 DIAGNOSIS — Z960 Presence of urogenital implants: Secondary | ICD-10-CM | POA: Diagnosis not present

## 2021-08-19 DIAGNOSIS — I10 Essential (primary) hypertension: Secondary | ICD-10-CM | POA: Diagnosis not present

## 2021-08-19 DIAGNOSIS — Z950 Presence of cardiac pacemaker: Secondary | ICD-10-CM | POA: Diagnosis not present

## 2021-08-19 DIAGNOSIS — K219 Gastro-esophageal reflux disease without esophagitis: Secondary | ICD-10-CM | POA: Diagnosis not present

## 2021-08-19 DIAGNOSIS — Z792 Long term (current) use of antibiotics: Secondary | ICD-10-CM | POA: Diagnosis not present

## 2021-08-19 DIAGNOSIS — E785 Hyperlipidemia, unspecified: Secondary | ICD-10-CM | POA: Diagnosis not present

## 2021-08-19 DIAGNOSIS — L89301 Pressure ulcer of unspecified buttock, stage 1: Secondary | ICD-10-CM | POA: Diagnosis not present

## 2021-08-19 DIAGNOSIS — R627 Adult failure to thrive: Secondary | ICD-10-CM | POA: Diagnosis not present

## 2021-08-19 DIAGNOSIS — J470 Bronchiectasis with acute lower respiratory infection: Secondary | ICD-10-CM | POA: Diagnosis not present

## 2021-08-19 DIAGNOSIS — M810 Age-related osteoporosis without current pathological fracture: Secondary | ICD-10-CM | POA: Diagnosis not present

## 2021-08-19 DIAGNOSIS — N319 Neuromuscular dysfunction of bladder, unspecified: Secondary | ICD-10-CM | POA: Diagnosis not present

## 2021-08-19 DIAGNOSIS — Z87440 Personal history of urinary (tract) infections: Secondary | ICD-10-CM | POA: Diagnosis not present

## 2021-08-19 DIAGNOSIS — H409 Unspecified glaucoma: Secondary | ICD-10-CM | POA: Diagnosis not present

## 2021-08-19 DIAGNOSIS — I4891 Unspecified atrial fibrillation: Secondary | ICD-10-CM | POA: Diagnosis not present

## 2021-08-19 DIAGNOSIS — M1120 Other chondrocalcinosis, unspecified site: Secondary | ICD-10-CM | POA: Diagnosis not present

## 2021-08-19 DIAGNOSIS — Z9981 Dependence on supplemental oxygen: Secondary | ICD-10-CM | POA: Diagnosis not present

## 2021-08-19 NOTE — Telephone Encounter (Signed)
Julie Silva calling to get an update.

## 2021-08-20 DIAGNOSIS — Z8709 Personal history of other diseases of the respiratory system: Secondary | ICD-10-CM | POA: Diagnosis not present

## 2021-08-20 DIAGNOSIS — R634 Abnormal weight loss: Secondary | ICD-10-CM | POA: Diagnosis not present

## 2021-08-20 DIAGNOSIS — A310 Pulmonary mycobacterial infection: Secondary | ICD-10-CM | POA: Diagnosis not present

## 2021-08-20 DIAGNOSIS — J470 Bronchiectasis with acute lower respiratory infection: Secondary | ICD-10-CM | POA: Diagnosis not present

## 2021-08-20 DIAGNOSIS — F03A0 Unspecified dementia, mild, without behavioral disturbance, psychotic disturbance, mood disturbance, and anxiety: Secondary | ICD-10-CM | POA: Diagnosis not present

## 2021-08-20 DIAGNOSIS — Z9981 Dependence on supplemental oxygen: Secondary | ICD-10-CM | POA: Diagnosis not present

## 2021-08-24 DIAGNOSIS — J470 Bronchiectasis with acute lower respiratory infection: Secondary | ICD-10-CM | POA: Diagnosis not present

## 2021-08-24 DIAGNOSIS — F03A0 Unspecified dementia, mild, without behavioral disturbance, psychotic disturbance, mood disturbance, and anxiety: Secondary | ICD-10-CM | POA: Diagnosis not present

## 2021-08-24 DIAGNOSIS — Z8709 Personal history of other diseases of the respiratory system: Secondary | ICD-10-CM | POA: Diagnosis not present

## 2021-08-24 DIAGNOSIS — R634 Abnormal weight loss: Secondary | ICD-10-CM | POA: Diagnosis not present

## 2021-08-24 DIAGNOSIS — A310 Pulmonary mycobacterial infection: Secondary | ICD-10-CM | POA: Diagnosis not present

## 2021-08-24 DIAGNOSIS — Z9981 Dependence on supplemental oxygen: Secondary | ICD-10-CM | POA: Diagnosis not present

## 2021-08-25 DIAGNOSIS — R634 Abnormal weight loss: Secondary | ICD-10-CM | POA: Diagnosis not present

## 2021-08-25 DIAGNOSIS — A310 Pulmonary mycobacterial infection: Secondary | ICD-10-CM | POA: Diagnosis not present

## 2021-08-25 DIAGNOSIS — Z8709 Personal history of other diseases of the respiratory system: Secondary | ICD-10-CM | POA: Diagnosis not present

## 2021-08-25 DIAGNOSIS — F03A0 Unspecified dementia, mild, without behavioral disturbance, psychotic disturbance, mood disturbance, and anxiety: Secondary | ICD-10-CM | POA: Diagnosis not present

## 2021-08-25 DIAGNOSIS — J470 Bronchiectasis with acute lower respiratory infection: Secondary | ICD-10-CM | POA: Diagnosis not present

## 2021-08-25 DIAGNOSIS — Z9981 Dependence on supplemental oxygen: Secondary | ICD-10-CM | POA: Diagnosis not present

## 2021-08-27 ENCOUNTER — Encounter: Payer: Self-pay | Admitting: *Deleted

## 2021-08-30 ENCOUNTER — Other Ambulatory Visit: Payer: Self-pay | Admitting: Internal Medicine

## 2021-08-30 DIAGNOSIS — J449 Chronic obstructive pulmonary disease, unspecified: Secondary | ICD-10-CM

## 2021-08-31 ENCOUNTER — Ambulatory Visit: Payer: Medicare Other | Admitting: Internal Medicine

## 2021-09-01 DIAGNOSIS — F03A0 Unspecified dementia, mild, without behavioral disturbance, psychotic disturbance, mood disturbance, and anxiety: Secondary | ICD-10-CM | POA: Diagnosis not present

## 2021-09-01 DIAGNOSIS — A310 Pulmonary mycobacterial infection: Secondary | ICD-10-CM | POA: Diagnosis not present

## 2021-09-01 DIAGNOSIS — J470 Bronchiectasis with acute lower respiratory infection: Secondary | ICD-10-CM | POA: Diagnosis not present

## 2021-09-01 DIAGNOSIS — Z9981 Dependence on supplemental oxygen: Secondary | ICD-10-CM | POA: Diagnosis not present

## 2021-09-01 DIAGNOSIS — R634 Abnormal weight loss: Secondary | ICD-10-CM | POA: Diagnosis not present

## 2021-09-01 DIAGNOSIS — Z8709 Personal history of other diseases of the respiratory system: Secondary | ICD-10-CM | POA: Diagnosis not present

## 2021-09-03 ENCOUNTER — Other Ambulatory Visit
Admission: RE | Admit: 2021-09-03 | Discharge: 2021-09-03 | Disposition: A | Discharge status: Home / Self Care | Payer: Medicare Other | Source: Ambulatory Visit | Attending: Ophthalmology | Admitting: Ophthalmology

## 2021-09-03 DIAGNOSIS — Z9981 Dependence on supplemental oxygen: Secondary | ICD-10-CM | POA: Diagnosis not present

## 2021-09-03 DIAGNOSIS — F03A0 Unspecified dementia, mild, without behavioral disturbance, psychotic disturbance, mood disturbance, and anxiety: Secondary | ICD-10-CM | POA: Diagnosis not present

## 2021-09-03 DIAGNOSIS — H409 Unspecified glaucoma: Secondary | ICD-10-CM | POA: Diagnosis not present

## 2021-09-03 DIAGNOSIS — K219 Gastro-esophageal reflux disease without esophagitis: Secondary | ICD-10-CM | POA: Diagnosis not present

## 2021-09-03 DIAGNOSIS — D649 Anemia, unspecified: Secondary | ICD-10-CM | POA: Diagnosis not present

## 2021-09-03 DIAGNOSIS — R634 Abnormal weight loss: Secondary | ICD-10-CM | POA: Diagnosis not present

## 2021-09-03 DIAGNOSIS — M1120 Other chondrocalcinosis, unspecified site: Secondary | ICD-10-CM | POA: Diagnosis not present

## 2021-09-03 DIAGNOSIS — Z950 Presence of cardiac pacemaker: Secondary | ICD-10-CM | POA: Diagnosis not present

## 2021-09-03 DIAGNOSIS — M810 Age-related osteoporosis without current pathological fracture: Secondary | ICD-10-CM | POA: Diagnosis not present

## 2021-09-03 DIAGNOSIS — J470 Bronchiectasis with acute lower respiratory infection: Secondary | ICD-10-CM | POA: Diagnosis not present

## 2021-09-03 DIAGNOSIS — R42 Dizziness and giddiness: Secondary | ICD-10-CM | POA: Diagnosis not present

## 2021-09-03 DIAGNOSIS — N319 Neuromuscular dysfunction of bladder, unspecified: Secondary | ICD-10-CM | POA: Diagnosis not present

## 2021-09-03 DIAGNOSIS — R627 Adult failure to thrive: Secondary | ICD-10-CM | POA: Diagnosis not present

## 2021-09-03 DIAGNOSIS — G4452 New daily persistent headache (NDPH): Secondary | ICD-10-CM | POA: Diagnosis not present

## 2021-09-03 DIAGNOSIS — Z8709 Personal history of other diseases of the respiratory system: Secondary | ICD-10-CM | POA: Diagnosis not present

## 2021-09-03 DIAGNOSIS — A310 Pulmonary mycobacterial infection: Secondary | ICD-10-CM | POA: Diagnosis not present

## 2021-09-03 DIAGNOSIS — I10 Essential (primary) hypertension: Secondary | ICD-10-CM | POA: Diagnosis not present

## 2021-09-03 DIAGNOSIS — L89301 Pressure ulcer of unspecified buttock, stage 1: Secondary | ICD-10-CM | POA: Diagnosis not present

## 2021-09-03 DIAGNOSIS — Z87440 Personal history of urinary (tract) infections: Secondary | ICD-10-CM | POA: Diagnosis not present

## 2021-09-03 DIAGNOSIS — Z960 Presence of urogenital implants: Secondary | ICD-10-CM | POA: Diagnosis not present

## 2021-09-03 DIAGNOSIS — I4891 Unspecified atrial fibrillation: Secondary | ICD-10-CM | POA: Diagnosis not present

## 2021-09-03 DIAGNOSIS — Z792 Long term (current) use of antibiotics: Secondary | ICD-10-CM | POA: Diagnosis not present

## 2021-09-03 DIAGNOSIS — E785 Hyperlipidemia, unspecified: Secondary | ICD-10-CM | POA: Diagnosis not present

## 2021-09-03 DIAGNOSIS — H353132 Nonexudative age-related macular degeneration, bilateral, intermediate dry stage: Secondary | ICD-10-CM | POA: Diagnosis not present

## 2021-09-03 DIAGNOSIS — I495 Sick sinus syndrome: Secondary | ICD-10-CM | POA: Diagnosis not present

## 2021-09-03 DIAGNOSIS — I2510 Atherosclerotic heart disease of native coronary artery without angina pectoris: Secondary | ICD-10-CM | POA: Diagnosis not present

## 2021-09-03 DIAGNOSIS — E43 Unspecified severe protein-calorie malnutrition: Secondary | ICD-10-CM | POA: Diagnosis not present

## 2021-09-03 LAB — C-REACTIVE PROTEIN: CRP: 1.1 mg/dL — ABNORMAL HIGH (ref <1.0)

## 2021-09-03 LAB — SEDIMENTATION RATE: Sed Rate: 54 mm/hr — ABNORMAL HIGH (ref 0–30)

## 2021-09-03 LAB — CBC
HCT: 39.9 % (ref 36.0–46.0)
Hemoglobin: 12.5 g/dL (ref 12.0–15.0)
MCH: 30 pg (ref 26.0–34.0)
MCHC: 31.3 g/dL (ref 30.0–36.0)
MCV: 95.7 fL (ref 80.0–100.0)
Platelets: 293 10*3/uL (ref 150–400)
RBC: 4.17 MIL/uL (ref 3.87–5.11)
RDW: 14.6 % (ref 11.5–15.5)
WBC: 9.4 10*3/uL (ref 4.0–10.5)
nRBC: 0 % (ref 0.0–0.2)

## 2021-09-04 DIAGNOSIS — R634 Abnormal weight loss: Secondary | ICD-10-CM | POA: Diagnosis not present

## 2021-09-04 DIAGNOSIS — F03A0 Unspecified dementia, mild, without behavioral disturbance, psychotic disturbance, mood disturbance, and anxiety: Secondary | ICD-10-CM | POA: Diagnosis not present

## 2021-09-04 DIAGNOSIS — Z9981 Dependence on supplemental oxygen: Secondary | ICD-10-CM | POA: Diagnosis not present

## 2021-09-04 DIAGNOSIS — J470 Bronchiectasis with acute lower respiratory infection: Secondary | ICD-10-CM | POA: Diagnosis not present

## 2021-09-04 DIAGNOSIS — Z8709 Personal history of other diseases of the respiratory system: Secondary | ICD-10-CM | POA: Diagnosis not present

## 2021-09-04 DIAGNOSIS — A310 Pulmonary mycobacterial infection: Secondary | ICD-10-CM | POA: Diagnosis not present

## 2021-09-08 DIAGNOSIS — A310 Pulmonary mycobacterial infection: Secondary | ICD-10-CM | POA: Diagnosis not present

## 2021-09-08 DIAGNOSIS — J470 Bronchiectasis with acute lower respiratory infection: Secondary | ICD-10-CM | POA: Diagnosis not present

## 2021-09-08 DIAGNOSIS — F03A0 Unspecified dementia, mild, without behavioral disturbance, psychotic disturbance, mood disturbance, and anxiety: Secondary | ICD-10-CM | POA: Diagnosis not present

## 2021-09-08 DIAGNOSIS — Z9981 Dependence on supplemental oxygen: Secondary | ICD-10-CM | POA: Diagnosis not present

## 2021-09-08 DIAGNOSIS — Z8709 Personal history of other diseases of the respiratory system: Secondary | ICD-10-CM | POA: Diagnosis not present

## 2021-09-08 DIAGNOSIS — R634 Abnormal weight loss: Secondary | ICD-10-CM | POA: Diagnosis not present

## 2021-09-09 DIAGNOSIS — Z9981 Dependence on supplemental oxygen: Secondary | ICD-10-CM | POA: Diagnosis not present

## 2021-09-09 DIAGNOSIS — A310 Pulmonary mycobacterial infection: Secondary | ICD-10-CM | POA: Diagnosis not present

## 2021-09-09 DIAGNOSIS — J470 Bronchiectasis with acute lower respiratory infection: Secondary | ICD-10-CM | POA: Diagnosis not present

## 2021-09-09 DIAGNOSIS — R634 Abnormal weight loss: Secondary | ICD-10-CM | POA: Diagnosis not present

## 2021-09-09 DIAGNOSIS — Z8709 Personal history of other diseases of the respiratory system: Secondary | ICD-10-CM | POA: Diagnosis not present

## 2021-09-09 DIAGNOSIS — F03A0 Unspecified dementia, mild, without behavioral disturbance, psychotic disturbance, mood disturbance, and anxiety: Secondary | ICD-10-CM | POA: Diagnosis not present

## 2021-09-15 DIAGNOSIS — A310 Pulmonary mycobacterial infection: Secondary | ICD-10-CM | POA: Diagnosis not present

## 2021-09-15 DIAGNOSIS — Z8709 Personal history of other diseases of the respiratory system: Secondary | ICD-10-CM | POA: Diagnosis not present

## 2021-09-15 DIAGNOSIS — J470 Bronchiectasis with acute lower respiratory infection: Secondary | ICD-10-CM | POA: Diagnosis not present

## 2021-09-15 DIAGNOSIS — F03A0 Unspecified dementia, mild, without behavioral disturbance, psychotic disturbance, mood disturbance, and anxiety: Secondary | ICD-10-CM | POA: Diagnosis not present

## 2021-09-15 DIAGNOSIS — R634 Abnormal weight loss: Secondary | ICD-10-CM | POA: Diagnosis not present

## 2021-09-15 DIAGNOSIS — Z9981 Dependence on supplemental oxygen: Secondary | ICD-10-CM | POA: Diagnosis not present

## 2021-09-17 ENCOUNTER — Encounter: Payer: Self-pay | Admitting: Internal Medicine

## 2021-09-20 ENCOUNTER — Other Ambulatory Visit: Payer: Self-pay | Admitting: Internal Medicine

## 2021-09-20 ENCOUNTER — Encounter: Payer: Self-pay | Admitting: Pulmonary Disease

## 2021-09-20 DIAGNOSIS — J449 Chronic obstructive pulmonary disease, unspecified: Secondary | ICD-10-CM

## 2021-09-20 MED ORDER — DRONABINOL 5 MG PO CAPS: 5.0000 mg | ORAL_CAPSULE | Freq: Two times a day (BID) | ORAL | 5 refills | Status: DC

## 2021-09-20 NOTE — Telephone Encounter (Signed)
Patients daughter states she had a ct done on 08/17/21 W contrast and has a CT ordered for Dr. Vaughan Browner scheduled for 9/20 CT WO contrast. She wants to know if they should continue with CT scheduled for 9/20. Please advise.

## 2021-09-20 NOTE — Telephone Encounter (Signed)
It was a CT abdomen pelvis so did not give me a complete view of the lungs.  However I agree that the parts that we can visualize that show small improvements.  We can cancel the CT chest ordered for 9/20 and reschedule it in 3 months.

## 2021-09-22 ENCOUNTER — Telehealth: Payer: Self-pay | Admitting: Neurology

## 2021-09-22 ENCOUNTER — Other Ambulatory Visit: Payer: Self-pay | Admitting: Neurology

## 2021-09-22 ENCOUNTER — Inpatient Hospital Stay: Admission: RE | Admit: 2021-09-22 | Payer: Medicare Other | Source: Ambulatory Visit

## 2021-09-22 MED ORDER — GABAPENTIN 100 MG PO CAPS: 100.0000 mg | ORAL_CAPSULE | Freq: Two times a day (BID) | ORAL | 0 refills | Status: DC

## 2021-09-22 NOTE — Telephone Encounter (Signed)
She is having a lot of issues, can try low dose gabapentin 100 at night and the morning if she experience pain. I will send a rx.

## 2021-09-22 NOTE — Telephone Encounter (Signed)
I returned pt's daughter call. She sts over the last month from 7 am till around 10 the pt has extreme head discomfort that brings her to tears.  Symptoms are localized the top/back of the head. Pt describes the head feeling full, hot, and burning. When these symptoms are present, blurred vision is also an issue.Pt has tried home remedies ( changing in sleeping position/pillows) along with OTC meds (Tylenol) without benefit. Pt recently evaluated by ophthalmology and exam reported as normal had CBC, Sed Rate, and C reactive protein drawn ( no concerns).   Daughter is requesting MD review message and give feed back on these symptoms.

## 2021-09-22 NOTE — Telephone Encounter (Signed)
Pt daughter is calling. [name] states mother is having head issues in the morning. Pt head feels full and hot and its effect her eyes. [name] is requesting a nurse give her a call

## 2021-09-22 NOTE — Telephone Encounter (Signed)
Called pt daughter,  Relayed message from MD. She appreciated call.

## 2021-09-29 ENCOUNTER — Telehealth: Payer: Self-pay | Admitting: Internal Medicine

## 2021-09-29 ENCOUNTER — Encounter: Payer: Self-pay | Admitting: Neurology

## 2021-09-29 ENCOUNTER — Ambulatory Visit: Payer: Medicare Other | Admitting: Pulmonary Disease

## 2021-09-29 NOTE — Telephone Encounter (Signed)
Patient is in Hospice, no need for an AWV at this time.

## 2021-09-30 DIAGNOSIS — A310 Pulmonary mycobacterial infection: Secondary | ICD-10-CM | POA: Diagnosis not present

## 2021-09-30 DIAGNOSIS — Z8709 Personal history of other diseases of the respiratory system: Secondary | ICD-10-CM | POA: Diagnosis not present

## 2021-09-30 DIAGNOSIS — Z9981 Dependence on supplemental oxygen: Secondary | ICD-10-CM | POA: Diagnosis not present

## 2021-09-30 DIAGNOSIS — J470 Bronchiectasis with acute lower respiratory infection: Secondary | ICD-10-CM | POA: Diagnosis not present

## 2021-09-30 DIAGNOSIS — F03A0 Unspecified dementia, mild, without behavioral disturbance, psychotic disturbance, mood disturbance, and anxiety: Secondary | ICD-10-CM | POA: Diagnosis not present

## 2021-09-30 DIAGNOSIS — R634 Abnormal weight loss: Secondary | ICD-10-CM | POA: Diagnosis not present

## 2021-10-03 DIAGNOSIS — I4891 Unspecified atrial fibrillation: Secondary | ICD-10-CM | POA: Diagnosis not present

## 2021-10-03 DIAGNOSIS — H409 Unspecified glaucoma: Secondary | ICD-10-CM | POA: Diagnosis not present

## 2021-10-03 DIAGNOSIS — J470 Bronchiectasis with acute lower respiratory infection: Secondary | ICD-10-CM | POA: Diagnosis not present

## 2021-10-03 DIAGNOSIS — Z950 Presence of cardiac pacemaker: Secondary | ICD-10-CM | POA: Diagnosis not present

## 2021-10-03 DIAGNOSIS — R627 Adult failure to thrive: Secondary | ICD-10-CM | POA: Diagnosis not present

## 2021-10-03 DIAGNOSIS — I2510 Atherosclerotic heart disease of native coronary artery without angina pectoris: Secondary | ICD-10-CM | POA: Diagnosis not present

## 2021-10-03 DIAGNOSIS — Z8709 Personal history of other diseases of the respiratory system: Secondary | ICD-10-CM | POA: Diagnosis not present

## 2021-10-03 DIAGNOSIS — R634 Abnormal weight loss: Secondary | ICD-10-CM | POA: Diagnosis not present

## 2021-10-03 DIAGNOSIS — M1120 Other chondrocalcinosis, unspecified site: Secondary | ICD-10-CM | POA: Diagnosis not present

## 2021-10-03 DIAGNOSIS — Z87440 Personal history of urinary (tract) infections: Secondary | ICD-10-CM | POA: Diagnosis not present

## 2021-10-03 DIAGNOSIS — L89301 Pressure ulcer of unspecified buttock, stage 1: Secondary | ICD-10-CM | POA: Diagnosis not present

## 2021-10-03 DIAGNOSIS — K219 Gastro-esophageal reflux disease without esophagitis: Secondary | ICD-10-CM | POA: Diagnosis not present

## 2021-10-03 DIAGNOSIS — Z792 Long term (current) use of antibiotics: Secondary | ICD-10-CM | POA: Diagnosis not present

## 2021-10-03 DIAGNOSIS — Z9981 Dependence on supplemental oxygen: Secondary | ICD-10-CM | POA: Diagnosis not present

## 2021-10-03 DIAGNOSIS — M810 Age-related osteoporosis without current pathological fracture: Secondary | ICD-10-CM | POA: Diagnosis not present

## 2021-10-03 DIAGNOSIS — I10 Essential (primary) hypertension: Secondary | ICD-10-CM | POA: Diagnosis not present

## 2021-10-03 DIAGNOSIS — R42 Dizziness and giddiness: Secondary | ICD-10-CM | POA: Diagnosis not present

## 2021-10-03 DIAGNOSIS — D649 Anemia, unspecified: Secondary | ICD-10-CM | POA: Diagnosis not present

## 2021-10-03 DIAGNOSIS — Z960 Presence of urogenital implants: Secondary | ICD-10-CM | POA: Diagnosis not present

## 2021-10-03 DIAGNOSIS — I495 Sick sinus syndrome: Secondary | ICD-10-CM | POA: Diagnosis not present

## 2021-10-03 DIAGNOSIS — F03A0 Unspecified dementia, mild, without behavioral disturbance, psychotic disturbance, mood disturbance, and anxiety: Secondary | ICD-10-CM | POA: Diagnosis not present

## 2021-10-03 DIAGNOSIS — N319 Neuromuscular dysfunction of bladder, unspecified: Secondary | ICD-10-CM | POA: Diagnosis not present

## 2021-10-03 DIAGNOSIS — E785 Hyperlipidemia, unspecified: Secondary | ICD-10-CM | POA: Diagnosis not present

## 2021-10-03 DIAGNOSIS — E43 Unspecified severe protein-calorie malnutrition: Secondary | ICD-10-CM | POA: Diagnosis not present

## 2021-10-03 DIAGNOSIS — A310 Pulmonary mycobacterial infection: Secondary | ICD-10-CM | POA: Diagnosis not present

## 2021-10-04 DIAGNOSIS — R634 Abnormal weight loss: Secondary | ICD-10-CM | POA: Diagnosis not present

## 2021-10-04 DIAGNOSIS — F03A0 Unspecified dementia, mild, without behavioral disturbance, psychotic disturbance, mood disturbance, and anxiety: Secondary | ICD-10-CM | POA: Diagnosis not present

## 2021-10-04 DIAGNOSIS — Z9981 Dependence on supplemental oxygen: Secondary | ICD-10-CM | POA: Diagnosis not present

## 2021-10-04 DIAGNOSIS — Z8709 Personal history of other diseases of the respiratory system: Secondary | ICD-10-CM | POA: Diagnosis not present

## 2021-10-04 DIAGNOSIS — J470 Bronchiectasis with acute lower respiratory infection: Secondary | ICD-10-CM | POA: Diagnosis not present

## 2021-10-04 DIAGNOSIS — A310 Pulmonary mycobacterial infection: Secondary | ICD-10-CM | POA: Diagnosis not present

## 2021-10-06 ENCOUNTER — Encounter: Payer: Self-pay | Admitting: Internal Medicine

## 2021-10-06 ENCOUNTER — Other Ambulatory Visit: Payer: Self-pay | Admitting: Internal Medicine

## 2021-10-06 DIAGNOSIS — R64 Cachexia: Secondary | ICD-10-CM

## 2021-10-06 DIAGNOSIS — J449 Chronic obstructive pulmonary disease, unspecified: Secondary | ICD-10-CM

## 2021-10-06 DIAGNOSIS — R110 Nausea: Secondary | ICD-10-CM

## 2021-10-06 MED ORDER — DRONABINOL 2.5 MG PO CAPS: 2.5000 mg | ORAL_CAPSULE | Freq: Two times a day (BID) | ORAL | 1 refills | Status: DC

## 2021-10-07 DIAGNOSIS — J470 Bronchiectasis with acute lower respiratory infection: Secondary | ICD-10-CM | POA: Diagnosis not present

## 2021-10-07 DIAGNOSIS — R634 Abnormal weight loss: Secondary | ICD-10-CM | POA: Diagnosis not present

## 2021-10-07 DIAGNOSIS — A310 Pulmonary mycobacterial infection: Secondary | ICD-10-CM | POA: Diagnosis not present

## 2021-10-07 DIAGNOSIS — F03A0 Unspecified dementia, mild, without behavioral disturbance, psychotic disturbance, mood disturbance, and anxiety: Secondary | ICD-10-CM | POA: Diagnosis not present

## 2021-10-07 DIAGNOSIS — Z9981 Dependence on supplemental oxygen: Secondary | ICD-10-CM | POA: Diagnosis not present

## 2021-10-07 DIAGNOSIS — Z8709 Personal history of other diseases of the respiratory system: Secondary | ICD-10-CM | POA: Diagnosis not present

## 2021-10-13 ENCOUNTER — Inpatient Hospital Stay: Payer: Medicare Other | Admitting: Oncology

## 2021-10-13 ENCOUNTER — Inpatient Hospital Stay: Payer: Medicare Other

## 2021-10-13 DIAGNOSIS — F03A0 Unspecified dementia, mild, without behavioral disturbance, psychotic disturbance, mood disturbance, and anxiety: Secondary | ICD-10-CM | POA: Diagnosis not present

## 2021-10-13 DIAGNOSIS — Z9981 Dependence on supplemental oxygen: Secondary | ICD-10-CM | POA: Diagnosis not present

## 2021-10-13 DIAGNOSIS — Z8709 Personal history of other diseases of the respiratory system: Secondary | ICD-10-CM | POA: Diagnosis not present

## 2021-10-13 DIAGNOSIS — R634 Abnormal weight loss: Secondary | ICD-10-CM | POA: Diagnosis not present

## 2021-10-13 DIAGNOSIS — J470 Bronchiectasis with acute lower respiratory infection: Secondary | ICD-10-CM | POA: Diagnosis not present

## 2021-10-13 DIAGNOSIS — A310 Pulmonary mycobacterial infection: Secondary | ICD-10-CM | POA: Diagnosis not present

## 2021-10-15 ENCOUNTER — Encounter: Payer: Self-pay | Admitting: Pulmonary Disease

## 2021-10-15 ENCOUNTER — Ambulatory Visit (INDEPENDENT_AMBULATORY_CARE_PROVIDER_SITE_OTHER): Payer: Medicare Other | Admitting: Nurse Practitioner

## 2021-10-15 ENCOUNTER — Encounter: Payer: Self-pay | Admitting: Nurse Practitioner

## 2021-10-15 ENCOUNTER — Ambulatory Visit (INDEPENDENT_AMBULATORY_CARE_PROVIDER_SITE_OTHER): Payer: Medicare Other

## 2021-10-15 VITALS — BP 144/70 | HR 73 | Ht 64.0 in | Wt 102.0 lb

## 2021-10-15 DIAGNOSIS — R059 Cough, unspecified: Secondary | ICD-10-CM | POA: Diagnosis not present

## 2021-10-15 DIAGNOSIS — J471 Bronchiectasis with (acute) exacerbation: Secondary | ICD-10-CM | POA: Diagnosis not present

## 2021-10-15 DIAGNOSIS — J9611 Chronic respiratory failure with hypoxia: Secondary | ICD-10-CM

## 2021-10-15 MED ORDER — PREDNISONE 20 MG PO TABS: 20.0000 mg | ORAL_TABLET | Freq: Every day | ORAL | 0 refills | Status: AC

## 2021-10-15 NOTE — Telephone Encounter (Signed)
Duplicate encounter. Pt scheduled for an appt.

## 2021-10-15 NOTE — Patient Instructions (Addendum)
Continue liquid mucinex three times daily   Increase Hypertonic Neb to twice daily until symptoms improve  Increase Xopenex Neb to twice daily until symptoms improve then return to in the evenings. Can use every 6 hours as needed for shortness of breath, cough, wheezing Increase Flutter valve to 2-3 times a day; use after your nebulizer High protein/calorie diet   Prednisone 20 mg daily for 5 days. Take in AM with food.   Chest x ray today.   Follow up in 2 weeks with Dr. Vaughan Browner or Alanson Aly. If symptoms do not improve or worsen, please contact office for sooner follow up or seek emergency care.

## 2021-10-15 NOTE — Assessment & Plan Note (Signed)
Mild exacerbation without significant change in sputum production.  She looks quite well; does have some mild bronchospasm and rales on exam. We will obtain CXR today to rule out superimposed infection.  Otherwise, recommended holding off on antibiotic therapy for right now.  We will treat her with prednisone burst of 20 mg for 5 days.  Also advised that she increase her bronchodilators and hypertonic saline nebs to help facilitate mucus production. Strict return precautions should she develop worsening symptoms.   Patient Instructions  Continue liquid mucinex three times daily   Increase Hypertonic Neb to twice daily until symptoms improve  Increase Xopenex Neb to twice daily until symptoms improve then return to in the evenings. Can use every 6 hours as needed for shortness of breath, cough, wheezing Increase Flutter valve to 2-3 times a day; use after your nebulizer High protein/calorie diet   Prednisone 20 mg daily for 5 days. Take in AM with food.   Chest x ray today.   Follow up in 2 weeks with Dr. Vaughan Browner or Alanson Aly. If symptoms do not improve or worsen, please contact office for sooner follow up or seek emergency care.

## 2021-10-15 NOTE — Telephone Encounter (Signed)
Called and spoke with pt's daughter and scheduled pt for an appt with [facility]. Nothing further needed.

## 2021-10-15 NOTE — Progress Notes (Signed)
_0  ID: Julie Silva, female    DOB: 1927/04/10, 88 y.o.   MRN: [id number]  Chief Complaint  Patient presents with   Follow-up    Referring provider: Janith Lima, MD  HPI: 88 year old female, never smoker followed for bronchiectasis, abnormal CT suspicious for MAI, and chronic respiratory failure on supplemental oxygen. She is a patient of Dr. Matilde Bash and last seen via virtual visit on 07/26/2021 by Parrett NP. Past medical history significant for a fib on amiodarone and eliquis, second degree AV block s/p pacemaker, GERD, HLD, diastolic CHF, IDA.   TEST/EVENTS:  06/12/2021 HRCT chest: very extensive, now dense consolidation and clustered centrilobular nodularity throughout the right lower lobe. Cavitary lesion in RLL 2.9x2.1 cm; cavitary lesion in LLL 1.5x1.1 cm. Unchanged dense consolidation and fibrosis in RML. Trace right pleural effusion.   07/26/2021: Virtual visit with Parrett,NP.  Seen approximately 1 month ago with increased symptoms with cough and congestion.  Treated with Augmentin for 10 days.  Feels like she keeps going downhill.  Cough and congestion have gotten somewhat better but she continues to feel very weak with low energy.  Very low appetite and has weight loss.  Has been started on Marinol by primary care but not seen any uptake in her appetite.  Trying to get at least 2 boosts a day in.  Very unhappy with her current eating situation as she does not want to eat every hour and she just does not have any appetite.  She is awaiting vest therapy.  Palliative care referral has been made but they have not yet met with palliative care.  Long discussion regarding goals of care and palliative care.  Upcoming CT chest in September.  Advised to use liquid Mucinex twice daily, hypertonic saline nebs, Xopenex nebs and flutter valve for mucociliary clearance.  Begin vest therapy once obtained.  10/15/2021: Today - acute Patient presents today with her daughter for acute visit.   Over the past 2 to 3 days, she has been coughing more.  Her sputum is overall unchanged from her baseline.  White to gray.  Does not feel like she had any increased production of sputum; just feels like she has to cough more often.  She does occasionally feel like mucus gets stuck in her chest and she has trouble getting it up.  Breathing is stable.  Her palliative care nurse practitioner noted that she had more crackles in her right lung.  Her daughter has noticed that her breathing has been a little more noisy, especially at night.  They have not noticed any significant wheezing.  She denies any fevers, chills, night sweats, hemoptysis.  She she is using her hypertonic saline neb in the morning and Xopenex neb in the evening.  She increase Mucinex to 3 times a day per the advice of the palliative NP.  She is not using her flutter valve on a consistent basis.  She did receive the vest therapy that was previously ordered but she was unable to tolerate this, even on the lowest setting.  Gave her a bad headache and felt like she had a lot of pressure in her head with it.  Allergies  Allergen Reactions   Codeine Other (See Comments)    Passed out   Colchicine Diarrhea    Immunization History  Administered Date(s) Administered   Fluad Quad(high Dose 65+) 12/11/2018, 12/25/2019, 10/02/2020   Influenza, High Dose Seasonal PF 10/03/2013, 09/25/2014, 10/06/2015, 10/11/2016, 10/26/2017, 02/02/2021   Influenza-Unspecified 10/03/2012, 11/04/2019,  02/02/2021   PFIZER(Purple Top)SARS-COV-2 Vaccination 01/29/2019, 02/12/2019, 11/25/2019   Pneumococcal Conjugate-13 05/07/2013   Pneumococcal Polysaccharide-23 06/09/2010   Td 04/03/2000   Tdap 05/07/2013   Unspecified SARS-COV-2 Vaccination 01/29/2019, 02/19/2019   Zoster, Live 06/08/2011    Past Medical History:  Diagnosis Date   Anemia    Aortic atherosclerosis (HCC)    Arthritis    Osteoporosis, neck stiffness   Atrial fibrillation ([place])    Bleeds  easily [facility])    "father was a hemophiliac" - pt not being bothered in recent years.   CAD (coronary artery disease)    Calcium deposit in bursa of knee 2017   Cancer ([place])    skin cancer "basal cell".   Chicken pox    Cyst, Baker's knee    left knee- tx. Cortisone injection 05-05-15 in office- "improved pain relief and swelling left ankle and knee"   Diverticulitis    Diverticulosis    Dysrhythmia    Dr. McAlhany-cardiology   GERD (gastroesophageal reflux disease)    Glaucoma of both eyes    Gout    Hearing loss of both ears    Bilateral ears- hearing aids used- right ear hearing betther than left.   Hiatal hernia    History of blood transfusion 1957   "lots; most related to my periods"-last 1968 -s/p Hysterectomy   Migraines    "years ago" (02/04/2013)   Motion sickness    back seat - cars, sudden movements   PAF (paroxysmal atrial fibrillation) (HCC)    Pancreatic cyst    Pelvic floor dysfunction    PONV (postoperative nausea and vomiting)    Risk for falls    "wobbley" per daughter   Self-catheterizes urinary bladder    "daily- retained urine and chronic UTI"   SVT (supraventricular tachycardia)    Dr. Darnell Level. Taylor-follows "loop recorder left chest"   Swallowing difficulty    freq occ. mostley liquids   Urine incontinence    UTI (urinary tract infection)    Chronic Urinary tract infections- tx Macrobid daily.   Vertigo    Wears hearing aid in both ears     Tobacco History: Social History   Tobacco Use  Smoking Status Never   Passive exposure: Past  Smokeless Tobacco Never   Counseling given: Not Answered   Outpatient Medications Prior to Visit  Medication Sig Dispense Refill   acetaminophen (TYLENOL) 500 MG tablet Take 500 mg by mouth 2 (two) times daily.     bimatoprost (LUMIGAN) 0.01 % SOLN Place 1 drop into both eyes at bedtime.     Catheters MISC Replace catheter twice daily. 180 each 1   cefdinir (OMNICEF) 250 MG/5ML suspension Take 250 mg by mouth daily.      dorzolamide-timolol (COSOPT) 22.3-6.8 MG/ML ophthalmic solution Place 1 drop into both eyes 2 (two) times daily.     dronabinol (MARINOL) 2.5 MG capsule Take 1 capsule (2.5 mg total) by mouth 2 (two) times daily before lunch and supper. 180 capsule 1   ELIQUIS 2.5 MG TABS tablet TAKE 1 TABLET TWICE A DAY 180 tablet 0   estradiol (ESTRACE) 0.1 MG/GM vaginal cream Place 1 Applicatorful vaginally at bedtime.     gabapentin (NEURONTIN) 100 MG capsule Take 1 capsule (100 mg total) by mouth 2 (two) times daily. 60 capsule 0   levalbuterol (XOPENEX) 0.63 MG/3ML nebulizer solution Take 3 mLs (0.63 mg total) by nebulization daily. 90 mL 5   omeprazole (PRILOSEC) 20 MG capsule TAKE 1 CAPSULE BY MOUTH EVERY  DAY 90 capsule 1   ondansetron (ZOFRAN-ODT) 4 MG disintegrating tablet Take 1 tablet (4 mg total) by mouth every 8 (eight) hours as needed. 20 tablet 0   sodium chloride HYPERTONIC 3 % nebulizer solution Take by nebulization 2 (two) times daily as needed for other. 750 mL 12   Wheat Dextrin (BENEFIBER PO) Take 1 mg by mouth daily.     amiodarone (PACERONE) 100 MG tablet Take 100 mg by mouth daily. (Patient not taking: Reported on 10/15/2021)     No facility-administered medications prior to visit.     Review of Systems:   Constitutional: No weight loss or gain, night sweats, fevers, chills. +fatigue, lassitude (baseline) HEENT: No headaches, difficulty swallowing, tooth/dental problems, or sore throat. No sneezing, itching, ear ache, nasal congestion, or post nasal drip CV:  No chest pain, orthopnea, PND, swelling in lower extremities, palpitations Resp: +shortness of breath with exertion (baseline); increased cough frequency. No excess mucus or change in color of mucus. No hemoptysis. No wheezing.  No chest wall deformity GI:  No heartburn, indigestion  MSK:  No joint pain or swelling. No back pain. Neuro: No dizziness or lightheadedness.  Psych: No depression or anxiety. Mood stable.      Physical Exam:  BP (!) 144/70 (BP Location: Right Arm, Cuff Size: Normal)   Pulse 73   Ht _0  (1.626 m)   Wt 102 lb (46.3 kg)   SpO2 98%   BMI 17.51 kg/m   GEN: Pleasant, interactive; elderly, frail; in no acute distress. HEENT:  Normocephalic and atraumatic. EACs patent bilaterally. TM pearly gray with present light reflex bilaterally. PERRLA. Sclera white. Nasal turbinates pink, moist and patent bilaterally. No rhinorrhea present. Oropharynx pink and moist, without exudate or edema. No lesions, ulcerations, or postnasal drip.  NECK:  Supple w/ fair ROM. No JVD present. No lymphadenopathy.   CV: RRR, no m/r/g, no peripheral edema. Pulses intact, +2 bilaterally. No cyanosis, pallor or clubbing. PULMONARY:  Unlabored, regular breathing. Minimal end expiratory wheeze RLL; scattered rales bilaterally A&P. No accessory muscle use.  GI: BS present and normoactive. Soft, non-tender to palpation. No organomegaly or masses detected.  MSK: No erythema, warmth or tenderness. No deformities or joint swelling noted. Muscle wasting Neuro: A/Ox3. No focal deficits noted.   Skin: Warm, no lesions or rashe Psych: Normal affect and behavior. Judgement and thought content appropriate.     Lab Results:  CBC    Component Value Date/Time   WBC 9.4 09/03/2021 1504   RBC 4.17 09/03/2021 1504   HGB 12.5 09/03/2021 1504   HGB 12.4 02/23/2018 1525   HCT 39.9 09/03/2021 1504   HCT 36.2 02/23/2018 1525   PLT 293 09/03/2021 1504   PLT 239 02/23/2018 1525   MCV 95.7 09/03/2021 1504   MCV 93 02/23/2018 1525   MCV 93 07/03/2012 1533   MCH 30.0 09/03/2021 1504   MCHC 31.3 09/03/2021 1504   RDW 14.6 09/03/2021 1504   RDW 12.4 02/23/2018 1525   RDW 13.1 07/03/2012 1533   LYMPHSABS 1.3 06/25/2021 1536   LYMPHSABS 1.7 02/23/2018 1525   LYMPHSABS 1.2 07/03/2012 1533   MONOABS 0.9 06/25/2021 1536   MONOABS 0.6 07/03/2012 1533   EOSABS 0.1 06/25/2021 1536   EOSABS 0.1 02/23/2018 1525   EOSABS  0.1 07/03/2012 1533   BASOSABS 0.0 06/25/2021 1536   BASOSABS 0.0 02/23/2018 1525   BASOSABS 0.1 07/03/2012 1533    BMET    Component Value Date/Time   NA 139 08/17/2021  1219   NA 142 02/23/2018 1525   K 3.8 08/17/2021 1219   CL 107 08/17/2021 1219   CO2 22 08/17/2021 1219   GLUCOSE 106 (H) 08/17/2021 1219   BUN 31 (H) 08/17/2021 1219   BUN 17 02/23/2018 1525   CREATININE 0.78 08/17/2021 1219   CALCIUM 9.0 08/17/2021 1219   GFRNONAA >60 08/17/2021 1219   GFRAA 70 02/23/2018 1525    BNP No results found for: "BNP"   Imaging:  No results found.        No data to display          No results found for: "NITRICOXIDE"      Assessment & Plan:   Bronchiectasis ([place]) Mild exacerbation without significant change in sputum production.  She looks quite well; does have some mild bronchospasm and rales on exam. We will obtain CXR today to rule out superimposed infection.  Otherwise, recommended holding off on antibiotic therapy for right now.  We will treat her with prednisone burst of 20 mg for 5 days.  Also advised that she increase her bronchodilators and hypertonic saline nebs to help facilitate mucus production. Strict return precautions should she develop worsening symptoms.   Patient Instructions  Continue liquid mucinex three times daily   Increase Hypertonic Neb to twice daily until symptoms improve  Increase Xopenex Neb to twice daily until symptoms improve then return to in the evenings. Can use every 6 hours as needed for shortness of breath, cough, wheezing Increase Flutter valve to 2-3 times a day; use after your nebulizer High protein/calorie diet   Prednisone 20 mg daily for 5 days. Take in AM with food.   Chest x ray today.   Follow up in 2 weeks with Dr. Vaughan Browner or Alanson Aly. If symptoms do not improve or worsen, please contact office for sooner follow up or seek emergency care.     Chronic respiratory failure with hypoxia (HCC) No increased  O2 requirement. She was 98% on room air in office today. Wears supplemental O2 2 lpm at night. Goal >88-90%.  I spent 35 minutes of dedicated to the care of this patient on the date of this encounter to include pre-visit review of records, face-to-face time with the patient discussing conditions above, post visit ordering of testing, clinical documentation with the electronic health record, making appropriate referrals as documented, and communicating necessary findings to members of the patients care team.  Clayton Bibles, NP 10/15/2021  Pt aware and understands NP's role.

## 2021-10-15 NOTE — Assessment & Plan Note (Signed)
No increased O2 requirement. She was 98% on room air in office today. Wears supplemental O2 2 lpm at night. Goal >88-90%.

## 2021-10-16 ENCOUNTER — Other Ambulatory Visit: Payer: Self-pay | Admitting: Neurology

## 2021-10-18 ENCOUNTER — Other Ambulatory Visit: Payer: Self-pay | Admitting: Neurology

## 2021-10-18 NOTE — Progress Notes (Signed)
Please notify patient that there are some increased changes on her chest x ray; could be infectious or inflammation vs chronic changes. If her cough has not improved since Friday with the prednisone, I will send in antibiotics. Thanks!

## 2021-10-18 NOTE — Telephone Encounter (Signed)
Filled as per Dr. Jabier Mutton last exchange with the patient "Please continue with 100 mg in the morning and 200 mg at night ".

## 2021-10-25 ENCOUNTER — Telehealth: Payer: Self-pay | Admitting: Nurse Practitioner

## 2021-10-25 NOTE — Telephone Encounter (Signed)
She's tolerated Xopenex for quite some time now so I'm not sure if that is the cause or if there is another underlying factor. Could be from the steroids she was on as well. If her breathing/cough is stable, she can hold it for the next few days to see if symptoms resolve. Continue saline nebs. Thanks.

## 2021-10-25 NOTE — Telephone Encounter (Signed)
Called and spoke with [name] who states pt has been having tremors and also elevated blood pressure which she states is from pt taking xopenex neb sol. Pt is doing one xopenex treatment again had the same problems when pt was originally told to take it twice a day.  Due to this, Julie Silva and pt want recommendations on what they should do. Katie, please advise.

## 2021-10-25 NOTE — Telephone Encounter (Signed)
Called and spoke with Julie Silva letting her know recs per [facility] and she verbalized understanding. Nothing further needed.

## 2021-10-26 ENCOUNTER — Other Ambulatory Visit: Payer: Self-pay | Admitting: Internal Medicine

## 2021-10-26 DIAGNOSIS — K2100 Gastro-esophageal reflux disease with esophagitis, without bleeding: Secondary | ICD-10-CM

## 2021-10-27 DIAGNOSIS — Z9981 Dependence on supplemental oxygen: Secondary | ICD-10-CM | POA: Diagnosis not present

## 2021-10-27 DIAGNOSIS — J470 Bronchiectasis with acute lower respiratory infection: Secondary | ICD-10-CM | POA: Diagnosis not present

## 2021-10-27 DIAGNOSIS — F03A0 Unspecified dementia, mild, without behavioral disturbance, psychotic disturbance, mood disturbance, and anxiety: Secondary | ICD-10-CM | POA: Diagnosis not present

## 2021-10-27 DIAGNOSIS — Z8709 Personal history of other diseases of the respiratory system: Secondary | ICD-10-CM | POA: Diagnosis not present

## 2021-10-27 DIAGNOSIS — A310 Pulmonary mycobacterial infection: Secondary | ICD-10-CM | POA: Diagnosis not present

## 2021-10-27 DIAGNOSIS — R634 Abnormal weight loss: Secondary | ICD-10-CM | POA: Diagnosis not present

## 2021-10-28 DIAGNOSIS — Z8709 Personal history of other diseases of the respiratory system: Secondary | ICD-10-CM | POA: Diagnosis not present

## 2021-10-28 DIAGNOSIS — A310 Pulmonary mycobacterial infection: Secondary | ICD-10-CM | POA: Diagnosis not present

## 2021-10-28 DIAGNOSIS — R634 Abnormal weight loss: Secondary | ICD-10-CM | POA: Diagnosis not present

## 2021-10-28 DIAGNOSIS — Z9981 Dependence on supplemental oxygen: Secondary | ICD-10-CM | POA: Diagnosis not present

## 2021-10-28 DIAGNOSIS — J470 Bronchiectasis with acute lower respiratory infection: Secondary | ICD-10-CM | POA: Diagnosis not present

## 2021-10-28 DIAGNOSIS — F03A0 Unspecified dementia, mild, without behavioral disturbance, psychotic disturbance, mood disturbance, and anxiety: Secondary | ICD-10-CM | POA: Diagnosis not present

## 2021-10-29 ENCOUNTER — Encounter: Payer: Self-pay | Admitting: Oncology

## 2021-11-03 DIAGNOSIS — Z960 Presence of urogenital implants: Secondary | ICD-10-CM | POA: Diagnosis not present

## 2021-11-03 DIAGNOSIS — H409 Unspecified glaucoma: Secondary | ICD-10-CM | POA: Diagnosis not present

## 2021-11-03 DIAGNOSIS — J470 Bronchiectasis with acute lower respiratory infection: Secondary | ICD-10-CM | POA: Diagnosis not present

## 2021-11-03 DIAGNOSIS — M810 Age-related osteoporosis without current pathological fracture: Secondary | ICD-10-CM | POA: Diagnosis not present

## 2021-11-03 DIAGNOSIS — Z792 Long term (current) use of antibiotics: Secondary | ICD-10-CM | POA: Diagnosis not present

## 2021-11-03 DIAGNOSIS — R42 Dizziness and giddiness: Secondary | ICD-10-CM | POA: Diagnosis not present

## 2021-11-03 DIAGNOSIS — I10 Essential (primary) hypertension: Secondary | ICD-10-CM | POA: Diagnosis not present

## 2021-11-03 DIAGNOSIS — E785 Hyperlipidemia, unspecified: Secondary | ICD-10-CM | POA: Diagnosis not present

## 2021-11-03 DIAGNOSIS — K219 Gastro-esophageal reflux disease without esophagitis: Secondary | ICD-10-CM | POA: Diagnosis not present

## 2021-11-03 DIAGNOSIS — E43 Unspecified severe protein-calorie malnutrition: Secondary | ICD-10-CM | POA: Diagnosis not present

## 2021-11-03 DIAGNOSIS — N319 Neuromuscular dysfunction of bladder, unspecified: Secondary | ICD-10-CM | POA: Diagnosis not present

## 2021-11-03 DIAGNOSIS — A310 Pulmonary mycobacterial infection: Secondary | ICD-10-CM | POA: Diagnosis not present

## 2021-11-03 DIAGNOSIS — Z9981 Dependence on supplemental oxygen: Secondary | ICD-10-CM | POA: Diagnosis not present

## 2021-11-03 DIAGNOSIS — L89301 Pressure ulcer of unspecified buttock, stage 1: Secondary | ICD-10-CM | POA: Diagnosis not present

## 2021-11-03 DIAGNOSIS — Z87440 Personal history of urinary (tract) infections: Secondary | ICD-10-CM | POA: Diagnosis not present

## 2021-11-03 DIAGNOSIS — I4891 Unspecified atrial fibrillation: Secondary | ICD-10-CM | POA: Diagnosis not present

## 2021-11-03 DIAGNOSIS — Z950 Presence of cardiac pacemaker: Secondary | ICD-10-CM | POA: Diagnosis not present

## 2021-11-03 DIAGNOSIS — F03A0 Unspecified dementia, mild, without behavioral disturbance, psychotic disturbance, mood disturbance, and anxiety: Secondary | ICD-10-CM | POA: Diagnosis not present

## 2021-11-03 DIAGNOSIS — R627 Adult failure to thrive: Secondary | ICD-10-CM | POA: Diagnosis not present

## 2021-11-03 DIAGNOSIS — D649 Anemia, unspecified: Secondary | ICD-10-CM | POA: Diagnosis not present

## 2021-11-03 DIAGNOSIS — M1120 Other chondrocalcinosis, unspecified site: Secondary | ICD-10-CM | POA: Diagnosis not present

## 2021-11-03 DIAGNOSIS — Z8709 Personal history of other diseases of the respiratory system: Secondary | ICD-10-CM | POA: Diagnosis not present

## 2021-11-03 DIAGNOSIS — R634 Abnormal weight loss: Secondary | ICD-10-CM | POA: Diagnosis not present

## 2021-11-03 DIAGNOSIS — I2510 Atherosclerotic heart disease of native coronary artery without angina pectoris: Secondary | ICD-10-CM | POA: Diagnosis not present

## 2021-11-03 DIAGNOSIS — I495 Sick sinus syndrome: Secondary | ICD-10-CM | POA: Diagnosis not present

## 2021-11-08 ENCOUNTER — Encounter: Payer: Self-pay | Admitting: Adult Health

## 2021-11-08 ENCOUNTER — Ambulatory Visit (INDEPENDENT_AMBULATORY_CARE_PROVIDER_SITE_OTHER): Payer: Medicare Other | Admitting: Adult Health

## 2021-11-08 VITALS — BP 116/60 | HR 80 | Ht 64.0 in | Wt 106.6 lb

## 2021-11-08 DIAGNOSIS — J479 Bronchiectasis, uncomplicated: Secondary | ICD-10-CM | POA: Diagnosis not present

## 2021-11-08 DIAGNOSIS — E440 Moderate protein-calorie malnutrition: Secondary | ICD-10-CM

## 2021-11-08 DIAGNOSIS — R9389 Abnormal findings on diagnostic imaging of other specified body structures: Secondary | ICD-10-CM

## 2021-11-08 DIAGNOSIS — Z23 Encounter for immunization: Secondary | ICD-10-CM

## 2021-11-08 DIAGNOSIS — E46 Unspecified protein-calorie malnutrition: Secondary | ICD-10-CM | POA: Insufficient documentation

## 2021-11-08 NOTE — Assessment & Plan Note (Signed)
Bronchiectasis-recent flare now seems to be improved.  Would continue with aggressive mucociliary clearance.  Was unable to tolerate vest therapy.  Would decrease hypertonic nebs down to 1-2 times daily. Use Xopenex half a vial during flares only if able to tolerate Continue with flutter valve 3 times daily. Activity as tolerated  Plan  Patient Instructions  Liquid mucinex Three times a day   Hypertonic Neb 1-2 times a day .  Xopenex Neb daily as needed  Flutter valve Three times a day   High protein /calorie diet  CT chest as planned  Flu shot today .  Follow up with Dr. Vaughan Browner or Kjell Brannen NP In 2-3 months and As needed   Please contact office for sooner follow up if symptoms do not improve or worsen or seek emergency care

## 2021-11-08 NOTE — Progress Notes (Signed)
$'@Patient'Z$  ID: Julie Silva, female    DOB: 1927-03-09, 88 y.o.   MRN: [id number]  Chief Complaint  Patient presents with   Follow-up    Referring provider: Janith Lima, MD  HPI: 88 year old female never smoker followed for bronchiectasis, abnormal CT chest suspicious for MAI and chronic respiratory failure on oxygen with activity and at bedtime Medical history significant for atrial fibrillation on amiodarone and Eliquis, status post pacemaker History of chronic UTI previously on Macrodantin, does self caths, followed by infectious disease at [facility]  TEST/EVENTS :  High-resolution CT chest June 10, 2021 very extensive, new dense consolidation and clustered nodularity through the dependent right lower lobe.  No significant change in the thick walled cavitary lesion in the right lower lobe measuring 2.9 x 2.1 cm.  No significant change in thick-walled cavitary lesion in the left lower lobe measuring 1.5 x 1.1 cm.  Unchanged dense consolidation and fibrosis of the right middle lobe.   11/08/2021 Follow up : Bronchiectasis , O2 RF  Patient returns for a 2-week follow-up.  Last visit patient was having increased cough and shortness of breath.  She was given a course of prednisone.  Patient says she is feeling better.  She actually has been getting stronger over the last several weeks.  Her weight is up 16 pounds over the last 6 months.  She says she is able to get around better at home.  She is using a walker.  She still has a daily cough that is quite congested.  She did try the vest however was unable to tolerate said it was too painful.  She is using flutter valve at least 2-3 times a day.  She is currently using the hypertonic nebs 3 times daily.  She says that she did use Xopenex for a while but caused some nervousness and tremors.  So she has stopped using this.  She denies any hemoptysis, discolored mucus or fever.  We discussed flu shot today which she would like to  get.  Allergies  Allergen Reactions   Codeine Other (See Comments)    Passed out   Colchicine Diarrhea    Immunization History  Administered Date(s) Administered   Fluad Quad(high Dose 65+) 12/11/2018, 12/25/2019, 10/02/2020   Influenza, High Dose Seasonal PF 10/03/2013, 09/25/2014, 10/06/2015, 10/11/2016, 10/26/2017, 02/02/2021   Influenza-Unspecified 10/03/2012, 11/04/2019, 02/02/2021   PFIZER(Purple Top)SARS-COV-2 Vaccination 01/29/2019, 02/12/2019, 11/25/2019   Pneumococcal Conjugate-13 05/07/2013   Pneumococcal Polysaccharide-23 06/09/2010   Td 04/03/2000   Tdap 05/07/2013   Unspecified SARS-COV-2 Vaccination 01/29/2019, 02/19/2019   Zoster, Live 06/08/2011    Past Medical History:  Diagnosis Date   Anemia    Aortic atherosclerosis (HCC)    Arthritis    Osteoporosis, neck stiffness   Atrial fibrillation ([place])    Bleeds easily [facility])    "father was a hemophiliac" - pt not being bothered in recent years.   CAD (coronary artery disease)    Calcium deposit in bursa of knee 2017   Cancer ([place])    skin cancer "basal cell".   Chicken pox    Cyst, Baker's knee    left knee- tx. Cortisone injection 05-05-15 in office- "improved pain relief and swelling left ankle and knee"   Diverticulitis    Diverticulosis    Dysrhythmia    Dr. McAlhany-cardiology   GERD (gastroesophageal reflux disease)    Glaucoma of both eyes    Gout    Hearing loss of both ears    Bilateral  ears- hearing aids used- right ear hearing betther than left.   Hiatal hernia    History of blood transfusion 1957   "lots; most related to my periods"-last 1968 -s/p Hysterectomy   Migraines    "years ago" (02/04/2013)   Motion sickness    back seat - cars, sudden movements   PAF (paroxysmal atrial fibrillation) (HCC)    Pancreatic cyst    Pelvic floor dysfunction    PONV (postoperative nausea and vomiting)    Risk for falls    "wobbley" per daughter   Self-catheterizes urinary bladder    "daily- retained  urine and chronic UTI"   SVT (supraventricular tachycardia)    Dr. Darnell Level. Taylor-follows "loop recorder left chest"   Swallowing difficulty    freq occ. mostley liquids   Urine incontinence    UTI (urinary tract infection)    Chronic Urinary tract infections- tx Macrobid daily.   Vertigo    Wears hearing aid in both ears     Tobacco History: Social History   Tobacco Use  Smoking Status Never   Passive exposure: Past  Smokeless Tobacco Never   Counseling given: Not Answered   Outpatient Medications Prior to Visit  Medication Sig Dispense Refill   acetaminophen (TYLENOL) 500 MG tablet Take 500 mg by mouth 2 (two) times daily.     bimatoprost (LUMIGAN) 0.01 % SOLN Place 1 drop into both eyes at bedtime.     Catheters MISC Replace catheter twice daily. 180 each 1   cefdinir (OMNICEF) 250 MG/5ML suspension Take 250 mg by mouth daily.     dorzolamide-timolol (COSOPT) 22.3-6.8 MG/ML ophthalmic solution Place 1 drop into both eyes 2 (two) times daily.     dronabinol (MARINOL) 2.5 MG capsule Take 1 capsule (2.5 mg total) by mouth 2 (two) times daily before lunch and supper. 180 capsule 1   ELIQUIS 2.5 MG TABS tablet TAKE 1 TABLET TWICE A DAY 180 tablet 0   estradiol (ESTRACE) 0.1 MG/GM vaginal cream Place 1 Applicatorful vaginally at bedtime.     famotidine (PEPCID) 40 MG tablet Take 40 mg by mouth daily.     gabapentin (NEURONTIN) 100 MG capsule Take 100 mg in the morning and 200 mg at night. 90 capsule 0   ondansetron (ZOFRAN-ODT) 4 MG disintegrating tablet Take 1 tablet (4 mg total) by mouth every 8 (eight) hours as needed. 20 tablet 0   sodium chloride HYPERTONIC 3 % nebulizer solution Take by nebulization 2 (two) times daily as needed for other. 750 mL 12   Wheat Dextrin (BENEFIBER PO) Take 1 mg by mouth daily.     amiodarone (PACERONE) 100 MG tablet Take 100 mg by mouth daily. (Patient not taking: Reported on 10/15/2021)     levalbuterol (XOPENEX) 0.63 MG/3ML nebulizer solution Take 3  mLs (0.63 mg total) by nebulization daily. (Patient not taking: Reported on 11/08/2021) 90 mL 5   omeprazole (PRILOSEC) 20 MG capsule TAKE 1 CAPSULE BY MOUTH EVERY DAY (Patient not taking: Reported on 11/08/2021) 90 capsule 1   No facility-administered medications prior to visit.     Review of Systems:   Constitutional:   No  weight loss, night sweats,  Fevers, chills,  +fatigue, or  lassitude.  HEENT:   No headaches,  Difficulty swallowing,  Tooth/dental problems, or  Sore throat,                No sneezing, itching, ear ache, nasal congestion, post nasal drip,   CV:  No chest pain,  Orthopnea, PND, swelling in lower extremities, anasarca, dizziness, palpitations, syncope.   GI  No heartburn, indigestion, abdominal pain, nausea, vomiting, diarrhea, change in bowel habits, loss of appetite, bloody stools.   Resp: No shortness of breath with exertion or at rest.  No excess mucus, no productive cough,  No non-productive cough,  No coughing up of blood.  No change in color of mucus.  No wheezing.  No chest wall deformity  Skin: no rash or lesions.  GU: no dysuria, change in color of urine, no urgency or frequency.  No flank pain, no hematuria  MS:  No joint pain or swelling.  No decreased range of motion.  No back pain.    Physical Exam  BP 116/60 (BP Location: Right Arm, Cuff Size: Normal)   Pulse 80   Ht '5\' 4"'$  (1.626 m)   Wt 106 lb 9.6 oz (48.4 kg)   SpO2 98%   BMI 18.30 kg/m   GEN: A/Ox3; pleasant , NAD, elderly, frail, wheelchair   HEENT:  /AT,   NOSE-clear, THROAT-clear, no lesions, no postnasal drip or exudate noted.   NECK:  Supple w/ fair ROM; no JVD; normal carotid impulses w/o bruits; no thyromegaly or nodules palpated; no lymphadenopathy.    RESP scattered rhonchi  no accessory muscle use, no dullness to percussion  CARD:  RRR, no m/r/g, no peripheral edema, pulses intact, no cyanosis or clubbing.  GI:   Soft & nt; nml bowel sounds; no organomegaly or masses  detected.   Musco: Warm bil, no deformities or joint swelling noted.   Neuro: alert, no focal deficits noted.    Skin: Warm, no lesions or rashes    Lab Results:  CBC    Component Value Date/Time   WBC 9.4 09/03/2021 1504   RBC 4.17 09/03/2021 1504   HGB 12.5 09/03/2021 1504   HGB 12.4 02/23/2018 1525   HCT 39.9 09/03/2021 1504   HCT 36.2 02/23/2018 1525   PLT 293 09/03/2021 1504   PLT 239 02/23/2018 1525   MCV 95.7 09/03/2021 1504   MCV 93 02/23/2018 1525   MCV 93 07/03/2012 1533   MCH 30.0 09/03/2021 1504   MCHC 31.3 09/03/2021 1504   RDW 14.6 09/03/2021 1504   RDW 12.4 02/23/2018 1525   RDW 13.1 07/03/2012 1533   LYMPHSABS 1.3 06/25/2021 1536   LYMPHSABS 1.7 02/23/2018 1525   LYMPHSABS 1.2 07/03/2012 1533   MONOABS 0.9 06/25/2021 1536   MONOABS 0.6 07/03/2012 1533   EOSABS 0.1 06/25/2021 1536   EOSABS 0.1 02/23/2018 1525   EOSABS 0.1 07/03/2012 1533   BASOSABS 0.0 06/25/2021 1536   BASOSABS 0.0 02/23/2018 1525   BASOSABS 0.1 07/03/2012 1533    BMET    Component Value Date/Time   NA 139 08/17/2021 1219   NA 142 02/23/2018 1525   K 3.8 08/17/2021 1219   CL 107 08/17/2021 1219   CO2 22 08/17/2021 1219   GLUCOSE 106 (H) 08/17/2021 1219   BUN 31 (H) 08/17/2021 1219   BUN 17 02/23/2018 1525   CREATININE 0.78 08/17/2021 1219   CALCIUM 9.0 08/17/2021 1219   GFRNONAA >60 08/17/2021 1219   GFRAA 70 02/23/2018 1525    BNP No results found for: "BNP"  ProBNP    Component Value Date/Time   PROBNP 138.0 (H) 06/25/2021 1536    Imaging: DG Chest 2 View  Result Date: 10/15/2021 CLINICAL DATA:  Increased cough EXAM: CHEST - 2 VIEW COMPARISON:  08/17/2021 chest and abdomen radiographs, 06/10/2021 CT chest  FINDINGS: Normal cardiac and mediastinal contours. Redemonstrated heterogeneous opacities in the right lower lung, which appear increased compared to the prior exam. The previously noted cavitary lesion in the right lung now measures 1.5 cm, previously 1.7  cm. Linear opacities in the left lung, most likely atelectasis. No pleural effusion or pneumothorax. No acute osseous abnormality. IMPRESSION: Redemonstrated heterogeneous opacities in the right lower lung, which appear increased compared to the prior exam, concerning for infection although aspiration could appear similar. Electronically Signed   By: Merilyn Baba M.D.   On: 10/15/2021 16:51          No data to display          No results found for: "NITRICOXIDE"      Assessment & Plan:   Bronchiectasis ([place]) Bronchiectasis-recent flare now seems to be improved.  Would continue with aggressive mucociliary clearance.  Was unable to tolerate vest therapy.  Would decrease hypertonic nebs down to 1-2 times daily. Use Xopenex half a vial during flares only if able to tolerate Continue with flutter valve 3 times daily. Activity as tolerated  Plan  Patient Instructions  Liquid mucinex Three times a day   Hypertonic Neb 1-2 times a day .  Xopenex Neb daily as needed  Flutter valve Three times a day   High protein /calorie diet  CT chest as planned  Flu shot today .  Follow up with Dr. Vaughan Browner or Bronco Mcgrory NP In 2-3 months and As needed   Please contact office for sooner follow up if symptoms do not improve or worsen or seek emergency care     Protein calorie malnutrition ([place]) Seems to be improving.  Weight is up 16 pounds over the last 6 months. Continue with a high-protein diet.  Abnormal CT scan, chest Lung nodularity with underlying bronchiectasis.  CT chest follow-up in December as planned.     Rexene Edison, NP 11/08/2021

## 2021-11-08 NOTE — Assessment & Plan Note (Signed)
Lung nodularity with underlying bronchiectasis.  CT chest follow-up in December as planned.

## 2021-11-08 NOTE — Assessment & Plan Note (Signed)
Seems to be improving.  Weight is up 16 pounds over the last 6 months. Continue with a high-protein diet.

## 2021-11-08 NOTE — Patient Instructions (Addendum)
Liquid mucinex Three times a day   Hypertonic Neb 1-2 times a day .  Xopenex Neb daily as needed  Flutter valve Three times a day   High protein /calorie diet  CT chest as planned  Flu shot today .  Follow up with Dr. Vaughan Browner or Taimane Stimmel NP In 2-3 months and As needed   Please contact office for sooner follow up if symptoms do not improve or worsen or seek emergency care

## 2021-11-10 ENCOUNTER — Telehealth: Payer: Self-pay

## 2021-11-10 DIAGNOSIS — A310 Pulmonary mycobacterial infection: Secondary | ICD-10-CM | POA: Diagnosis not present

## 2021-11-10 DIAGNOSIS — Z0289 Encounter for other administrative examinations: Secondary | ICD-10-CM

## 2021-11-10 DIAGNOSIS — J470 Bronchiectasis with acute lower respiratory infection: Secondary | ICD-10-CM | POA: Diagnosis not present

## 2021-11-10 DIAGNOSIS — Z9981 Dependence on supplemental oxygen: Secondary | ICD-10-CM | POA: Diagnosis not present

## 2021-11-10 DIAGNOSIS — F03A0 Unspecified dementia, mild, without behavioral disturbance, psychotic disturbance, mood disturbance, and anxiety: Secondary | ICD-10-CM | POA: Diagnosis not present

## 2021-11-10 DIAGNOSIS — R634 Abnormal weight loss: Secondary | ICD-10-CM | POA: Diagnosis not present

## 2021-11-10 DIAGNOSIS — Z8709 Personal history of other diseases of the respiratory system: Secondary | ICD-10-CM | POA: Diagnosis not present

## 2021-11-10 NOTE — Telephone Encounter (Signed)
FMLA received from pt's daughter to assist with taking care of the pt.   Forms have been completed, signed and faxed back to [facility]   Copy sent to charge. Copy sent to pt's daughter, Lanette Hampshire. Original filed with [employer].

## 2021-11-17 DIAGNOSIS — J470 Bronchiectasis with acute lower respiratory infection: Secondary | ICD-10-CM | POA: Diagnosis not present

## 2021-11-17 DIAGNOSIS — F03A0 Unspecified dementia, mild, without behavioral disturbance, psychotic disturbance, mood disturbance, and anxiety: Secondary | ICD-10-CM | POA: Diagnosis not present

## 2021-11-17 DIAGNOSIS — Z8709 Personal history of other diseases of the respiratory system: Secondary | ICD-10-CM | POA: Diagnosis not present

## 2021-11-17 DIAGNOSIS — A310 Pulmonary mycobacterial infection: Secondary | ICD-10-CM | POA: Diagnosis not present

## 2021-11-17 DIAGNOSIS — Z9981 Dependence on supplemental oxygen: Secondary | ICD-10-CM | POA: Diagnosis not present

## 2021-11-17 DIAGNOSIS — R634 Abnormal weight loss: Secondary | ICD-10-CM | POA: Diagnosis not present

## 2021-11-23 ENCOUNTER — Other Ambulatory Visit: Payer: Self-pay | Admitting: Neurology

## 2021-11-24 DIAGNOSIS — R634 Abnormal weight loss: Secondary | ICD-10-CM | POA: Diagnosis not present

## 2021-11-24 DIAGNOSIS — J470 Bronchiectasis with acute lower respiratory infection: Secondary | ICD-10-CM | POA: Diagnosis not present

## 2021-11-24 DIAGNOSIS — F03A0 Unspecified dementia, mild, without behavioral disturbance, psychotic disturbance, mood disturbance, and anxiety: Secondary | ICD-10-CM | POA: Diagnosis not present

## 2021-11-24 DIAGNOSIS — Z8709 Personal history of other diseases of the respiratory system: Secondary | ICD-10-CM | POA: Diagnosis not present

## 2021-11-24 DIAGNOSIS — Z9981 Dependence on supplemental oxygen: Secondary | ICD-10-CM | POA: Diagnosis not present

## 2021-11-24 DIAGNOSIS — A310 Pulmonary mycobacterial infection: Secondary | ICD-10-CM | POA: Diagnosis not present

## 2021-11-30 DIAGNOSIS — F03A0 Unspecified dementia, mild, without behavioral disturbance, psychotic disturbance, mood disturbance, and anxiety: Secondary | ICD-10-CM | POA: Diagnosis not present

## 2021-11-30 DIAGNOSIS — R634 Abnormal weight loss: Secondary | ICD-10-CM | POA: Diagnosis not present

## 2021-11-30 DIAGNOSIS — Z9981 Dependence on supplemental oxygen: Secondary | ICD-10-CM | POA: Diagnosis not present

## 2021-11-30 DIAGNOSIS — Z8709 Personal history of other diseases of the respiratory system: Secondary | ICD-10-CM | POA: Diagnosis not present

## 2021-11-30 DIAGNOSIS — A310 Pulmonary mycobacterial infection: Secondary | ICD-10-CM | POA: Diagnosis not present

## 2021-11-30 DIAGNOSIS — J470 Bronchiectasis with acute lower respiratory infection: Secondary | ICD-10-CM | POA: Diagnosis not present

## 2021-12-01 DIAGNOSIS — A310 Pulmonary mycobacterial infection: Secondary | ICD-10-CM | POA: Diagnosis not present

## 2021-12-01 DIAGNOSIS — Z9981 Dependence on supplemental oxygen: Secondary | ICD-10-CM | POA: Diagnosis not present

## 2021-12-01 DIAGNOSIS — R634 Abnormal weight loss: Secondary | ICD-10-CM | POA: Diagnosis not present

## 2021-12-01 DIAGNOSIS — J470 Bronchiectasis with acute lower respiratory infection: Secondary | ICD-10-CM | POA: Diagnosis not present

## 2021-12-01 DIAGNOSIS — Z8709 Personal history of other diseases of the respiratory system: Secondary | ICD-10-CM | POA: Diagnosis not present

## 2021-12-01 DIAGNOSIS — F03A0 Unspecified dementia, mild, without behavioral disturbance, psychotic disturbance, mood disturbance, and anxiety: Secondary | ICD-10-CM | POA: Diagnosis not present

## 2021-12-03 DIAGNOSIS — A310 Pulmonary mycobacterial infection: Secondary | ICD-10-CM | POA: Diagnosis not present

## 2021-12-03 DIAGNOSIS — E785 Hyperlipidemia, unspecified: Secondary | ICD-10-CM | POA: Diagnosis not present

## 2021-12-03 DIAGNOSIS — Z950 Presence of cardiac pacemaker: Secondary | ICD-10-CM | POA: Diagnosis not present

## 2021-12-03 DIAGNOSIS — J470 Bronchiectasis with acute lower respiratory infection: Secondary | ICD-10-CM | POA: Diagnosis not present

## 2021-12-03 DIAGNOSIS — Z87440 Personal history of urinary (tract) infections: Secondary | ICD-10-CM | POA: Diagnosis not present

## 2021-12-03 DIAGNOSIS — K219 Gastro-esophageal reflux disease without esophagitis: Secondary | ICD-10-CM | POA: Diagnosis not present

## 2021-12-03 DIAGNOSIS — I495 Sick sinus syndrome: Secondary | ICD-10-CM | POA: Diagnosis not present

## 2021-12-03 DIAGNOSIS — L89301 Pressure ulcer of unspecified buttock, stage 1: Secondary | ICD-10-CM | POA: Diagnosis not present

## 2021-12-03 DIAGNOSIS — Z960 Presence of urogenital implants: Secondary | ICD-10-CM | POA: Diagnosis not present

## 2021-12-03 DIAGNOSIS — Z8709 Personal history of other diseases of the respiratory system: Secondary | ICD-10-CM | POA: Diagnosis not present

## 2021-12-03 DIAGNOSIS — Z741 Need for assistance with personal care: Secondary | ICD-10-CM | POA: Diagnosis not present

## 2021-12-03 DIAGNOSIS — N319 Neuromuscular dysfunction of bladder, unspecified: Secondary | ICD-10-CM | POA: Diagnosis not present

## 2021-12-03 DIAGNOSIS — L89321 Pressure ulcer of left buttock, stage 1: Secondary | ICD-10-CM | POA: Diagnosis not present

## 2021-12-03 DIAGNOSIS — I2510 Atherosclerotic heart disease of native coronary artery without angina pectoris: Secondary | ICD-10-CM | POA: Diagnosis not present

## 2021-12-03 DIAGNOSIS — Z792 Long term (current) use of antibiotics: Secondary | ICD-10-CM | POA: Diagnosis not present

## 2021-12-03 DIAGNOSIS — Z9981 Dependence on supplemental oxygen: Secondary | ICD-10-CM | POA: Diagnosis not present

## 2021-12-03 DIAGNOSIS — D509 Iron deficiency anemia, unspecified: Secondary | ICD-10-CM | POA: Diagnosis not present

## 2021-12-03 DIAGNOSIS — R627 Adult failure to thrive: Secondary | ICD-10-CM | POA: Diagnosis not present

## 2021-12-03 DIAGNOSIS — F03A0 Unspecified dementia, mild, without behavioral disturbance, psychotic disturbance, mood disturbance, and anxiety: Secondary | ICD-10-CM | POA: Diagnosis not present

## 2021-12-03 DIAGNOSIS — E43 Unspecified severe protein-calorie malnutrition: Secondary | ICD-10-CM | POA: Diagnosis not present

## 2021-12-03 DIAGNOSIS — I4891 Unspecified atrial fibrillation: Secondary | ICD-10-CM | POA: Diagnosis not present

## 2021-12-03 DIAGNOSIS — R634 Abnormal weight loss: Secondary | ICD-10-CM | POA: Diagnosis not present

## 2021-12-03 DIAGNOSIS — R42 Dizziness and giddiness: Secondary | ICD-10-CM | POA: Diagnosis not present

## 2021-12-03 DIAGNOSIS — R230 Cyanosis: Secondary | ICD-10-CM | POA: Diagnosis not present

## 2021-12-03 DIAGNOSIS — I10 Essential (primary) hypertension: Secondary | ICD-10-CM | POA: Diagnosis not present

## 2021-12-08 DIAGNOSIS — R634 Abnormal weight loss: Secondary | ICD-10-CM | POA: Diagnosis not present

## 2021-12-08 DIAGNOSIS — A310 Pulmonary mycobacterial infection: Secondary | ICD-10-CM | POA: Diagnosis not present

## 2021-12-08 DIAGNOSIS — Z950 Presence of cardiac pacemaker: Secondary | ICD-10-CM | POA: Diagnosis not present

## 2021-12-08 DIAGNOSIS — Z45018 Encounter for adjustment and management of other part of cardiac pacemaker: Secondary | ICD-10-CM | POA: Diagnosis not present

## 2021-12-08 DIAGNOSIS — J470 Bronchiectasis with acute lower respiratory infection: Secondary | ICD-10-CM | POA: Diagnosis not present

## 2021-12-08 DIAGNOSIS — Z9981 Dependence on supplemental oxygen: Secondary | ICD-10-CM | POA: Diagnosis not present

## 2021-12-08 DIAGNOSIS — Z7901 Long term (current) use of anticoagulants: Secondary | ICD-10-CM | POA: Diagnosis not present

## 2021-12-08 DIAGNOSIS — I480 Paroxysmal atrial fibrillation: Secondary | ICD-10-CM | POA: Diagnosis not present

## 2021-12-08 DIAGNOSIS — F03A0 Unspecified dementia, mild, without behavioral disturbance, psychotic disturbance, mood disturbance, and anxiety: Secondary | ICD-10-CM | POA: Diagnosis not present

## 2021-12-08 DIAGNOSIS — Z8709 Personal history of other diseases of the respiratory system: Secondary | ICD-10-CM | POA: Diagnosis not present

## 2021-12-09 ENCOUNTER — Ambulatory Visit (INDEPENDENT_AMBULATORY_CARE_PROVIDER_SITE_OTHER): Payer: Medicare Other | Admitting: Neurology

## 2021-12-09 ENCOUNTER — Encounter: Payer: Self-pay | Admitting: Neurology

## 2021-12-09 VITALS — BP 131/73 | HR 75 | Ht 64.0 in

## 2021-12-09 DIAGNOSIS — G44209 Tension-type headache, unspecified, not intractable: Secondary | ICD-10-CM

## 2021-12-09 DIAGNOSIS — Z8709 Personal history of other diseases of the respiratory system: Secondary | ICD-10-CM | POA: Diagnosis not present

## 2021-12-09 DIAGNOSIS — G301 Alzheimer's disease with late onset: Secondary | ICD-10-CM | POA: Diagnosis not present

## 2021-12-09 DIAGNOSIS — R634 Abnormal weight loss: Secondary | ICD-10-CM | POA: Diagnosis not present

## 2021-12-09 DIAGNOSIS — F03A0 Unspecified dementia, mild, without behavioral disturbance, psychotic disturbance, mood disturbance, and anxiety: Secondary | ICD-10-CM | POA: Diagnosis not present

## 2021-12-09 DIAGNOSIS — F02A0 Dementia in other diseases classified elsewhere, mild, without behavioral disturbance, psychotic disturbance, mood disturbance, and anxiety: Secondary | ICD-10-CM | POA: Diagnosis not present

## 2021-12-09 DIAGNOSIS — J470 Bronchiectasis with acute lower respiratory infection: Secondary | ICD-10-CM | POA: Diagnosis not present

## 2021-12-09 DIAGNOSIS — A310 Pulmonary mycobacterial infection: Secondary | ICD-10-CM | POA: Diagnosis not present

## 2021-12-09 DIAGNOSIS — E46 Unspecified protein-calorie malnutrition: Secondary | ICD-10-CM

## 2021-12-09 DIAGNOSIS — Z9981 Dependence on supplemental oxygen: Secondary | ICD-10-CM | POA: Diagnosis not present

## 2021-12-09 MED ORDER — GABAPENTIN 100 MG PO CAPS: 300.0000 mg | ORAL_CAPSULE | Freq: Two times a day (BID) | ORAL | 3 refills | Status: DC

## 2021-12-09 NOTE — Patient Instructions (Addendum)
Increase gabapentin to 200 mg twice daily, can increase as tolerated to 300 mg twice daily Head CT for new persistent headaches Continue your other medications Follow-up in 6 months or sooner if worse.

## 2021-12-09 NOTE — Progress Notes (Signed)
[facility]  PATIENT: Julie Silva DOB: 03-24-1927  REQUESTING CLINICIAN: Janith Lima, MD HISTORY FROM: Daughter  REASON FOR VISIT: Poor appetite, poor sleep, memory deficit, r/o dementia    HISTORICAL  CHIEF COMPLAINT:  Chief Complaint  Patient presents with   Follow-up    Rm 13. Accompanied by daughter, [name]. Gabapentin is helpful for hat band headache. Lying down in the morning makes the pain worse. No new memory concerns.   INTERVAL HISTORY 12/09/2021:  Patient presents today for follow up, accompanied by her daughter [name].  Since last visit, we have started gabapentin for headache.  She reports that gabapentin has markedly improved her headaches.  Currently she is on gabapentin 100 mg in the morning and 200 mg at night, still continue to have occasional headaches but not as bad as before.  She reports that laying down in 1 position does cause her to have sharp pain on top and in the back of the head.  In terms of her memory she reported to be stable, does not have any new concerns.  She feels like her appetite has increased a little bit she has gained some weight.   HISTORY OF PRESENT ILLNESS:  This is a 88 year old woman past medical history of atrial fibrillation, pelvic floor dysfunction with self-catheterization, history of multiple UTIs, history of poor oral intake and malnutrition, hearing loss, who is presenting for overall decline in the past year but worse in the past few months, and poor sleep.   In brief daughter reports patient have a history of multiple UTIs, history of poor p.o. intake and weight loss for many years.  Her symptoms have been getting worse for the past year and a half and acutely worse in the past 3 to 4 months after being admitted in the hospital for urinary tract infection.  Daughter reported that in February patient has rapid A-fib, which was controlled with amiodarone.  At that time also she was found to have a resistant UTI,  admitted to hospital for IV antibiotic and discharged on cefdinir.  From there she also had lung infection, bronchiectasis and since discharge from the hospital her appetite has been very poor.  She is also very weak, a year ago she used to walk independently but now needs wheelchair or at least a walker.  During this time also she was noted to need supplemental oxygen, but her O2 requirement has lessened.  She still have the supplemental oxygen but she only needed as needed. In May, she also had another bout of UTI.   Daughter reports that her sleep is poor, she goes to bed around 9/10 PM but will be restless at night, will toss and turn, sometimes will remove her oxygen machine.   She is also talking during her sleep and act out her dreams.  Some nights she will get up trying to leave the bedroom or some night, she does not know how to get to her own bedroom, thinking that the bedroom is upstairs where in fact there are no stairs in the house.  Due to her worsening dream, her mirtazapine was discontinued due to possible side effect REM sleep abnormal behavior.  Currently she is not taking any appetite stimulant, in the past she tried mirtazapine and Megace. She was on mirtazapine for the past 4 years but lost of appetite is getting worse.   She did also try supplement like boost but currently not using it.    In terms of her  memory, there is also concern of memory loss.  Patient lives with her 2 daughters who help assist her in her activities of daily living.  She currently is not driving, has not driven for the past 2 years.  She needs help with cooking, cleaning, bathing and toileting and also daughter is helping her bills.  She does not have any issue going to familiar places or difficult with names of family members. She also feels like her mood is low.     TBI:   No past history of TBI Stroke:    no past history of stroke Seizures:   no past history of seizures Sleep:   no history of sleep  apnea.  Mood:   patient denies anxiety and depression  Functional status: Dependent in most ADLs and IADLs Patient lives with daughter.  Cooking: No Cleaning: No Shopping: No  Bathing: needs help  Toileting:  Patient  Driving: Not driving for the past 2 year  Bills: Daughter   Ever left the stove on by accident?: No Forget how to use items around the house?: No  Getting lost going to familiar places?: No  Forgetting loved ones names?: No  Word finding difficulty? Yes  Sleep: Poor, will get restless    OTHER MEDICAL CONDITIONS: recurrent UTIs, weight loss, Bronchiectasis, atrial fibrillation    REVIEW OF SYSTEMS: Full 14 system review of systems performed and negative with exception of: as noted in the HPI   ALLERGIES: Allergies  Allergen Reactions   Codeine Other (See Comments)    Passed out   Colchicine Diarrhea    HOME MEDICATIONS: Outpatient Medications Prior to Visit  Medication Sig Dispense Refill   acetaminophen (TYLENOL) 500 MG tablet Take 500 mg by mouth 2 (two) times daily.     bimatoprost (LUMIGAN) 0.01 % SOLN Place 1 drop into both eyes at bedtime.     Catheters MISC Replace catheter twice daily. 180 each 1   cefdinir (OMNICEF) 250 MG/5ML suspension Take 250 mg by mouth daily.     dorzolamide-timolol (COSOPT) 22.3-6.8 MG/ML ophthalmic solution Place 1 drop into both eyes 2 (two) times daily.     dronabinol (MARINOL) 2.5 MG capsule Take 1 capsule (2.5 mg total) by mouth 2 (two) times daily before lunch and supper. 180 capsule 1   ELIQUIS 2.5 MG TABS tablet TAKE 1 TABLET TWICE A DAY 180 tablet 0   estradiol (ESTRACE) 0.1 MG/GM vaginal cream Place 1 Applicatorful vaginally at bedtime.     guaifenesin (ROBITUSSIN) 100 MG/5ML syrup Take 10 mLs by mouth 3 (three) times daily as needed for cough.     Melatonin 1 MG CHEW Chew 1 tablet by mouth as needed.     omeprazole (PRILOSEC) 20 MG capsule TAKE 1 CAPSULE BY MOUTH EVERY DAY 90 capsule 1   ondansetron (ZOFRAN-ODT)  4 MG disintegrating tablet Take 1 tablet (4 mg total) by mouth every 8 (eight) hours as needed. 20 tablet 0   sodium chloride HYPERTONIC 3 % nebulizer solution Take by nebulization 2 (two) times daily as needed for other. 750 mL 12   Wheat Dextrin (BENEFIBER PO) Take 1 mg by mouth daily.     gabapentin (NEURONTIN) 100 MG capsule TAKE 1 CAPSULE BY MOUTH EVERY MORNING AND TAKE 2 CAPSULES AT NIGHT 90 capsule 0   amiodarone (PACERONE) 100 MG tablet Take 100 mg by mouth daily. (Patient not taking: Reported on 10/15/2021)     famotidine (PEPCID) 40 MG tablet Take 40 mg by mouth daily.  levalbuterol (XOPENEX) 0.63 MG/3ML nebulizer solution Take 3 mLs (0.63 mg total) by nebulization daily. (Patient not taking: Reported on 11/08/2021) 90 mL 5   No facility-administered medications prior to visit.    PAST MEDICAL HISTORY: Past Medical History:  Diagnosis Date   Anemia    Aortic atherosclerosis (HCC)    Arthritis    Osteoporosis, neck stiffness   Atrial fibrillation ([place])    Bleeds easily [facility])    "father was a hemophiliac" - pt not being bothered in recent years.   CAD (coronary artery disease)    Calcium deposit in bursa of knee 2017   Cancer ([place])    skin cancer "basal cell".   Chicken pox    Cyst, Baker's knee    left knee- tx. Cortisone injection 05-05-15 in office- "improved pain relief and swelling left ankle and knee"   Diverticulitis    Diverticulosis    Dysrhythmia    Dr. McAlhany-cardiology   GERD (gastroesophageal reflux disease)    Glaucoma of both eyes    Gout    Hearing loss of both ears    Bilateral ears- hearing aids used- right ear hearing betther than left.   Hiatal hernia    History of blood transfusion 1957   "lots; most related to my periods"-last 1968 -s/p Hysterectomy   Migraines    "years ago" (02/04/2013)   Motion sickness    back seat - cars, sudden movements   PAF (paroxysmal atrial fibrillation) (HCC)    Pancreatic cyst    Pelvic floor dysfunction     PONV (postoperative nausea and vomiting)    Risk for falls    "wobbley" per daughter   Self-catheterizes urinary bladder    "daily- retained urine and chronic UTI"   SVT (supraventricular tachycardia)    Dr. Darnell Level. [place]-follows "loop recorder left chest"   Swallowing difficulty    freq occ. mostley liquids   Urine incontinence    UTI (urinary tract infection)    Chronic Urinary tract infections- tx Macrobid daily.   Vertigo    Wears hearing aid in both ears     PAST SURGICAL HISTORY: Past Surgical History:  Procedure Laterality Date   APPENDECTOMY  01/03/1946   BLADDER SURGERY  01/03/1993   Repair-    BREAST BIOPSY Bilateral    "both were fine"   CATARACT EXTRACTION W/ INTRAOCULAR LENS IMPLANT Left    CATARACT EXTRACTION W/PHACO Right 10/16/2018   Procedure: CATARACT EXTRACTION PHACO AND INTRAOCULAR LENS PLACEMENT ([place])  RIGHT MiLoop diabetes 01:05.8  21.0%  13.77;  Surgeon: Birder Robson, MD;  Location: [place];  Service: Ophthalmology;  Laterality: Right;  BS tends to drop in AM and would like early surgery so she can get home to eat, please   CHOLECYSTECTOMY  01/04/2012   COMBINED HYSTERECTOMY ABDOMINAL W/ A&P REPAIR / OOPHORECTOMY     DILATION AND CURETTAGE OF UTERUS     EP IMPLANTABLE DEVICE N/A 06/19/2014   Procedure: Loop Recorder Insertion;  Surgeon: Evans Lance, MD;  Location: [place] [facility];  Service: Cardiovascular;  Laterality: N/A;   EXCISION OF BREAST BIOPSY Right 05/13/2015   Procedure: RIGHT BREAST EXCISIONAL BIOPSY x2;  Surgeon: Armandina Gemma, MD;  Location: [facility];  Service: General;  Laterality: Right;   PACEMAKER IMPLANT  02/2020   TONSILLECTOMY  01/04/1944   TOTAL ABDOMINAL HYSTERECTOMY     VAGINAL HYSTERECTOMY  01/03/1966   vaginal mesh      FAMILY HISTORY: Family History  Problem Relation Age  of Onset   Stroke Father    CVA Father    Ovarian cancer Mother    Cancer - Other Sister        Breast   Breast cancer Sister    Cancer  - Other Brother        Throat   Cancer - Other Sister        Throat   Brain cancer Sister    Cancer - Other Other        Brain   Breast cancer Other     SOCIAL HISTORY: Social History   Socioeconomic History   Marital status: Widowed    Spouse name: Not on file   Number of children: 4   Years of education: 12   Highest education level: Not on file  Occupational History   Occupation: Runner, broadcasting/film/video  Tobacco Use   Smoking status: Never    Passive exposure: Past   Smokeless tobacco: Never  Vaping Use   Vaping Use: Never used  Substance and Sexual Activity   Alcohol use: No    Alcohol/week: 0.0 standard drinks of alcohol   Drug use: No   Sexual activity: Never  Other Topics Concern   Not on file  Social History Narrative   Lives alone in a one story home.  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    Social Determinants of Health   Financial Resource Strain: Low Risk  (10/26/2017)   Overall Financial Resource Strain (CARDIA)    Difficulty of Paying Living Expenses: Not hard at all  Food Insecurity: No Food Insecurity (10/26/2017)   Hunger Vital Sign    Worried About Running Out of Food in the Last Year: Never true    Ran Out of Food in the Last Year: Never true  Transportation Needs: No Transportation Needs (10/26/2017)   PRAPARE - Hydrologist (Medical): No    Lack of Transportation (Non-Medical): No  Physical Activity: Inactive (10/26/2017)   Exercise Vital Sign    Days of Exercise per Week: 0 days    Minutes of Exercise per Session: 0 min  Stress: No Stress Concern Present (10/26/2017)   [place]    Feeling of Stress : Not at all  Social Connections: Moderately Integrated (10/26/2017)   Social Connection and Isolation Panel [NHANES]    Frequency of Communication with Friends and Family: More than three times a week    Frequency of Social Gatherings with  Friends and Family: More than three times a week    Attends Religious Services: 1 to 4 times per year    Active Member of [employer] or Organizations: Yes    Attends Archivist Meetings: 1 to 4 times per year    Marital Status: Widowed  Intimate Partner Violence: Not on file    PHYSICAL EXAM  GENERAL EXAM/CONSTITUTIONAL: Vitals:  Vitals:   12/09/21 1337  BP: 131/73  Pulse: 75  Height: '5\' 4"'$  (1.626 m)   Body mass index is 18.3 kg/m. Wt Readings from Last 3 Encounters:  11/08/21 106 lb 9.6 oz (48.4 kg)  10/15/21 102 lb (46.3 kg)  06/29/21 90 lb (40.8 kg)   Patient is in no distress; well developed, nourished and groomed; neck is supple  EYES: Pupils round and reactive to light, Visual fields full to confrontation, Extraocular movements intacts,   MUSCULOSKELETAL: Gait, strength, tone, movements noted in Neurologic exam below  NEUROLOGIC: MENTAL STATUS:  10/26/2017    3:27 PM  MMSE - Mini Mental State Exam  Not completed: Refused      12/09/2021    1:38 PM 06/07/2021    3:31 PM  Montreal Cognitive Assessment   Visuospatial/ Executive (0/5) 2 2  Naming (0/3) 2 2  Attention: Read list of digits (0/2) 2 2  Attention: Read list of letters (0/1) 1 0  Attention: Serial 7 subtraction starting at 100 (0/3) 3 0  Language: Repeat phrase (0/2) 2 2  Language : Fluency (0/1) 0 0  Abstraction (0/2) 1 2  Delayed Recall (0/5) 0 0  Orientation (0/6) 6 6  Total 19 16  Adjusted Score (based on education) 20   Able to spell PENCIL forward and backward  Very flat and blunted affect  CRANIAL NERVE:  2nd, 3rd, 4th, 6th - visual fields full to confrontation, extraocular muscles intact, no nystagmus 5th - facial sensation symmetric 7th - facial strength symmetric 8th - hearing intact 9th - palate elevates symmetrically, uvula midline 11th - shoulder shrug symmetric 12th - tongue protrusion midline  MOTOR:  Decrease bulk, full strength in the BUE, BLE  SENSORY:   normal and symmetric to light touch  COORDINATION:  finger-nose-finger, fine finger movements normal  REFLEXES:  deep tendon reflexes present and symmetric  GAIT/STATION:  In a wheelchair    DIAGNOSTIC DATA (LABS, IMAGING, TESTING) - I reviewed patient records, labs, notes, testing and imaging myself where available.  Lab Results  Component Value Date   WBC 9.4 09/03/2021   HGB 12.5 09/03/2021   HCT 39.9 09/03/2021   MCV 95.7 09/03/2021   PLT 293 09/03/2021      Component Value Date/Time   NA 139 08/17/2021 1219   NA 142 02/23/2018 1525   K 3.8 08/17/2021 1219   CL 107 08/17/2021 1219   CO2 22 08/17/2021 1219   GLUCOSE 106 (H) 08/17/2021 1219   BUN 31 (H) 08/17/2021 1219   BUN 17 02/23/2018 1525   CREATININE 0.78 08/17/2021 1219   CALCIUM 9.0 08/17/2021 1219   PROT 7.4 08/17/2021 1219   ALBUMIN 3.5 08/17/2021 1219   AST 19 08/17/2021 1219   ALT 21 08/17/2021 1219   ALKPHOS 77 08/17/2021 1219   BILITOT 0.6 08/17/2021 1219   GFRNONAA >60 08/17/2021 1219   GFRAA 70 02/23/2018 1525   Lab Results  Component Value Date   CHOL 168 04/04/2016   HDL 58.10 04/04/2016   LDLCALC 89 04/04/2016   TRIG 105.0 04/04/2016   CHOLHDL 3 04/04/2016   Lab Results  Component Value Date   HGBA1C 5.5 05/07/2013   Lab Results  Component Value Date   BTDVVOHY07 371 03/30/2021   Lab Results  Component Value Date   TSH 4.81 03/30/2021     ASSESSMENT AND PLAN  88 y.o. year old female with multiple medical conditions including atrial fibrillation, multiple UTIs, history of pelvic floor dysfunction requiring self-catheterization, poor oral Intake who is presenting for follow up for her poor oral intake, weight loss, sleep difficulty and memory problem and headaches. In term of the poor oral intake  and weight loss, daughter has reported since her headaches have been reduced, she has increased her po intake and actually, has gained weight. Her headaches are better controlled with  gabapentin but not fully resolved. Will increase the Gabapentin as tolerated up to 300 mg twice daily. Her memory is stable and actually, she did well on the [place] today.     1. Tension-type headache, not  intractable, unspecified chronicity pattern   2. Caloric malnutrition ([place])   3. Mild late onset Alzheimer's dementia without behavioral disturbance, psychotic disturbance, mood disturbance, or anxiety (HCC)       Patient Instructions  Increase gabapentin to 200 mg twice daily, can increase as tolerated to 300 mg twice daily Head CT for new persistent headaches Continue your other medications Follow-up in 6 months or sooner if worse.  Orders Placed This Encounter  Procedures   CT HEAD WO CONTRAST (5MM)    Meds ordered this encounter  Medications   gabapentin (NEURONTIN) 100 MG capsule    Sig: Take 3 capsules (300 mg total) by mouth 2 (two) times daily. TAKE 1 CAPSULE BY MOUTH EVERY MORNING AND TAKE 2 CAPSULES AT NIGHT    Dispense:  540 capsule    Refill:  3    Return in about 6 months (around 06/10/2022).  I have spent a total of 45 minutes dedicated to this patient today, preparing to see patient, performing a medically appropriate examination and evaluation, ordering tests and/or medications and procedures, and counseling and educating the patient/family/caregiver; independently interpreting result and communicating results to the family/patient/caregiver; and documenting clinical information in the electronic medical record.    Alric Ran, MD 12/09/2021, 9:14 PM  [facility] [address], [place], [place] [address] [phone]

## 2021-12-13 ENCOUNTER — Telehealth: Payer: Self-pay | Admitting: Neurology

## 2021-12-13 NOTE — Telephone Encounter (Signed)
medicare/BCBS sup NPR sent to GI [phone] 

## 2021-12-15 ENCOUNTER — Ambulatory Visit
Admission: RE | Admit: 2021-12-15 | Discharge: 2021-12-15 | Disposition: A | Discharge status: Home / Self Care | Payer: Medicare Other | Source: Ambulatory Visit | Attending: Pulmonary Disease | Admitting: Pulmonary Disease

## 2021-12-15 ENCOUNTER — Ambulatory Visit
Admission: RE | Admit: 2021-12-15 | Discharge: 2021-12-15 | Disposition: A | Discharge status: Home / Self Care | Payer: Medicare Other | Source: Ambulatory Visit | Attending: Neurology | Admitting: Neurology

## 2021-12-15 DIAGNOSIS — J479 Bronchiectasis, uncomplicated: Secondary | ICD-10-CM | POA: Diagnosis not present

## 2021-12-15 DIAGNOSIS — Z8709 Personal history of other diseases of the respiratory system: Secondary | ICD-10-CM | POA: Diagnosis not present

## 2021-12-15 DIAGNOSIS — G44209 Tension-type headache, unspecified, not intractable: Secondary | ICD-10-CM

## 2021-12-15 DIAGNOSIS — R519 Headache, unspecified: Secondary | ICD-10-CM | POA: Diagnosis not present

## 2021-12-15 DIAGNOSIS — Z9981 Dependence on supplemental oxygen: Secondary | ICD-10-CM | POA: Diagnosis not present

## 2021-12-15 DIAGNOSIS — R911 Solitary pulmonary nodule: Secondary | ICD-10-CM | POA: Diagnosis not present

## 2021-12-15 DIAGNOSIS — R634 Abnormal weight loss: Secondary | ICD-10-CM | POA: Diagnosis not present

## 2021-12-15 DIAGNOSIS — I700 Atherosclerosis of aorta: Secondary | ICD-10-CM | POA: Diagnosis not present

## 2021-12-15 DIAGNOSIS — F03A0 Unspecified dementia, mild, without behavioral disturbance, psychotic disturbance, mood disturbance, and anxiety: Secondary | ICD-10-CM | POA: Diagnosis not present

## 2021-12-15 DIAGNOSIS — A310 Pulmonary mycobacterial infection: Secondary | ICD-10-CM | POA: Diagnosis not present

## 2021-12-15 DIAGNOSIS — J470 Bronchiectasis with acute lower respiratory infection: Secondary | ICD-10-CM | POA: Diagnosis not present

## 2021-12-20 NOTE — Telephone Encounter (Signed)
It could be coming from post nasal drainage  Would recommend Saline nasal spray Twice daily  , saline gel At bedtime  and try Flonase Nasal 2 puffs daily  See if this helps if not call us or message [place] back  Please contact office for sooner follow up if symptoms do not improve or worsen or seek emergency care

## 2021-12-22 DIAGNOSIS — Z9981 Dependence on supplemental oxygen: Secondary | ICD-10-CM | POA: Diagnosis not present

## 2021-12-22 DIAGNOSIS — A310 Pulmonary mycobacterial infection: Secondary | ICD-10-CM | POA: Diagnosis not present

## 2021-12-22 DIAGNOSIS — R634 Abnormal weight loss: Secondary | ICD-10-CM | POA: Diagnosis not present

## 2021-12-22 DIAGNOSIS — F03A0 Unspecified dementia, mild, without behavioral disturbance, psychotic disturbance, mood disturbance, and anxiety: Secondary | ICD-10-CM | POA: Diagnosis not present

## 2021-12-22 DIAGNOSIS — Z8709 Personal history of other diseases of the respiratory system: Secondary | ICD-10-CM | POA: Diagnosis not present

## 2021-12-22 DIAGNOSIS — J470 Bronchiectasis with acute lower respiratory infection: Secondary | ICD-10-CM | POA: Diagnosis not present

## 2021-12-24 ENCOUNTER — Other Ambulatory Visit: Payer: Self-pay | Admitting: Adult Health

## 2021-12-29 DIAGNOSIS — R634 Abnormal weight loss: Secondary | ICD-10-CM | POA: Diagnosis not present

## 2021-12-29 DIAGNOSIS — J470 Bronchiectasis with acute lower respiratory infection: Secondary | ICD-10-CM | POA: Diagnosis not present

## 2021-12-29 DIAGNOSIS — F03A0 Unspecified dementia, mild, without behavioral disturbance, psychotic disturbance, mood disturbance, and anxiety: Secondary | ICD-10-CM | POA: Diagnosis not present

## 2021-12-29 DIAGNOSIS — Z9981 Dependence on supplemental oxygen: Secondary | ICD-10-CM | POA: Diagnosis not present

## 2021-12-29 DIAGNOSIS — A310 Pulmonary mycobacterial infection: Secondary | ICD-10-CM | POA: Diagnosis not present

## 2021-12-29 DIAGNOSIS — Z8709 Personal history of other diseases of the respiratory system: Secondary | ICD-10-CM | POA: Diagnosis not present

## 2022-01-03 DIAGNOSIS — J470 Bronchiectasis with acute lower respiratory infection: Secondary | ICD-10-CM | POA: Diagnosis not present

## 2022-01-03 DIAGNOSIS — Z792 Long term (current) use of antibiotics: Secondary | ICD-10-CM | POA: Diagnosis not present

## 2022-01-03 DIAGNOSIS — Z741 Need for assistance with personal care: Secondary | ICD-10-CM | POA: Diagnosis not present

## 2022-01-03 DIAGNOSIS — I495 Sick sinus syndrome: Secondary | ICD-10-CM | POA: Diagnosis not present

## 2022-01-03 DIAGNOSIS — Z8709 Personal history of other diseases of the respiratory system: Secondary | ICD-10-CM | POA: Diagnosis not present

## 2022-01-03 DIAGNOSIS — A310 Pulmonary mycobacterial infection: Secondary | ICD-10-CM | POA: Diagnosis not present

## 2022-01-03 DIAGNOSIS — L89301 Pressure ulcer of unspecified buttock, stage 1: Secondary | ICD-10-CM | POA: Diagnosis not present

## 2022-01-03 DIAGNOSIS — Z9981 Dependence on supplemental oxygen: Secondary | ICD-10-CM | POA: Diagnosis not present

## 2022-01-03 DIAGNOSIS — Z960 Presence of urogenital implants: Secondary | ICD-10-CM | POA: Diagnosis not present

## 2022-01-03 DIAGNOSIS — D509 Iron deficiency anemia, unspecified: Secondary | ICD-10-CM | POA: Diagnosis not present

## 2022-01-03 DIAGNOSIS — F03A0 Unspecified dementia, mild, without behavioral disturbance, psychotic disturbance, mood disturbance, and anxiety: Secondary | ICD-10-CM | POA: Diagnosis not present

## 2022-01-03 DIAGNOSIS — Z950 Presence of cardiac pacemaker: Secondary | ICD-10-CM | POA: Diagnosis not present

## 2022-01-03 DIAGNOSIS — E785 Hyperlipidemia, unspecified: Secondary | ICD-10-CM | POA: Diagnosis not present

## 2022-01-03 DIAGNOSIS — K219 Gastro-esophageal reflux disease without esophagitis: Secondary | ICD-10-CM | POA: Diagnosis not present

## 2022-01-03 DIAGNOSIS — R230 Cyanosis: Secondary | ICD-10-CM | POA: Diagnosis not present

## 2022-01-03 DIAGNOSIS — R634 Abnormal weight loss: Secondary | ICD-10-CM | POA: Diagnosis not present

## 2022-01-03 DIAGNOSIS — I4891 Unspecified atrial fibrillation: Secondary | ICD-10-CM | POA: Diagnosis not present

## 2022-01-03 DIAGNOSIS — I10 Essential (primary) hypertension: Secondary | ICD-10-CM | POA: Diagnosis not present

## 2022-01-03 DIAGNOSIS — L89321 Pressure ulcer of left buttock, stage 1: Secondary | ICD-10-CM | POA: Diagnosis not present

## 2022-01-03 DIAGNOSIS — E43 Unspecified severe protein-calorie malnutrition: Secondary | ICD-10-CM | POA: Diagnosis not present

## 2022-01-03 DIAGNOSIS — R627 Adult failure to thrive: Secondary | ICD-10-CM | POA: Diagnosis not present

## 2022-01-03 DIAGNOSIS — R42 Dizziness and giddiness: Secondary | ICD-10-CM | POA: Diagnosis not present

## 2022-01-03 DIAGNOSIS — I2510 Atherosclerotic heart disease of native coronary artery without angina pectoris: Secondary | ICD-10-CM | POA: Diagnosis not present

## 2022-01-03 DIAGNOSIS — N319 Neuromuscular dysfunction of bladder, unspecified: Secondary | ICD-10-CM | POA: Diagnosis not present

## 2022-01-03 DIAGNOSIS — Z87440 Personal history of urinary (tract) infections: Secondary | ICD-10-CM | POA: Diagnosis not present

## 2022-01-05 DIAGNOSIS — J470 Bronchiectasis with acute lower respiratory infection: Secondary | ICD-10-CM | POA: Diagnosis not present

## 2022-01-05 DIAGNOSIS — R634 Abnormal weight loss: Secondary | ICD-10-CM | POA: Diagnosis not present

## 2022-01-05 DIAGNOSIS — A310 Pulmonary mycobacterial infection: Secondary | ICD-10-CM | POA: Diagnosis not present

## 2022-01-05 DIAGNOSIS — F03A0 Unspecified dementia, mild, without behavioral disturbance, psychotic disturbance, mood disturbance, and anxiety: Secondary | ICD-10-CM | POA: Diagnosis not present

## 2022-01-05 DIAGNOSIS — Z8709 Personal history of other diseases of the respiratory system: Secondary | ICD-10-CM | POA: Diagnosis not present

## 2022-01-05 DIAGNOSIS — Z9981 Dependence on supplemental oxygen: Secondary | ICD-10-CM | POA: Diagnosis not present

## 2022-01-07 ENCOUNTER — Encounter: Payer: Self-pay | Admitting: Oncology

## 2022-01-09 DIAGNOSIS — F03A0 Unspecified dementia, mild, without behavioral disturbance, psychotic disturbance, mood disturbance, and anxiety: Secondary | ICD-10-CM | POA: Diagnosis not present

## 2022-01-09 DIAGNOSIS — Z9981 Dependence on supplemental oxygen: Secondary | ICD-10-CM | POA: Diagnosis not present

## 2022-01-09 DIAGNOSIS — R634 Abnormal weight loss: Secondary | ICD-10-CM | POA: Diagnosis not present

## 2022-01-09 DIAGNOSIS — A310 Pulmonary mycobacterial infection: Secondary | ICD-10-CM | POA: Diagnosis not present

## 2022-01-09 DIAGNOSIS — Z8709 Personal history of other diseases of the respiratory system: Secondary | ICD-10-CM | POA: Diagnosis not present

## 2022-01-09 DIAGNOSIS — J470 Bronchiectasis with acute lower respiratory infection: Secondary | ICD-10-CM | POA: Diagnosis not present

## 2022-01-12 DIAGNOSIS — J470 Bronchiectasis with acute lower respiratory infection: Secondary | ICD-10-CM | POA: Diagnosis not present

## 2022-01-12 DIAGNOSIS — Z9981 Dependence on supplemental oxygen: Secondary | ICD-10-CM | POA: Diagnosis not present

## 2022-01-12 DIAGNOSIS — R634 Abnormal weight loss: Secondary | ICD-10-CM | POA: Diagnosis not present

## 2022-01-12 DIAGNOSIS — F03A0 Unspecified dementia, mild, without behavioral disturbance, psychotic disturbance, mood disturbance, and anxiety: Secondary | ICD-10-CM | POA: Diagnosis not present

## 2022-01-12 DIAGNOSIS — Z8709 Personal history of other diseases of the respiratory system: Secondary | ICD-10-CM | POA: Diagnosis not present

## 2022-01-12 DIAGNOSIS — A310 Pulmonary mycobacterial infection: Secondary | ICD-10-CM | POA: Diagnosis not present

## 2022-01-14 ENCOUNTER — Ambulatory Visit: Payer: Medicare Other | Admitting: Pulmonary Disease

## 2022-01-19 DIAGNOSIS — Z9981 Dependence on supplemental oxygen: Secondary | ICD-10-CM | POA: Diagnosis not present

## 2022-01-19 DIAGNOSIS — Z8709 Personal history of other diseases of the respiratory system: Secondary | ICD-10-CM | POA: Diagnosis not present

## 2022-01-19 DIAGNOSIS — R634 Abnormal weight loss: Secondary | ICD-10-CM | POA: Diagnosis not present

## 2022-01-19 DIAGNOSIS — A310 Pulmonary mycobacterial infection: Secondary | ICD-10-CM | POA: Diagnosis not present

## 2022-01-19 DIAGNOSIS — J470 Bronchiectasis with acute lower respiratory infection: Secondary | ICD-10-CM | POA: Diagnosis not present

## 2022-01-19 DIAGNOSIS — F03A0 Unspecified dementia, mild, without behavioral disturbance, psychotic disturbance, mood disturbance, and anxiety: Secondary | ICD-10-CM | POA: Diagnosis not present

## 2022-01-26 DIAGNOSIS — Z8709 Personal history of other diseases of the respiratory system: Secondary | ICD-10-CM | POA: Diagnosis not present

## 2022-01-26 DIAGNOSIS — J470 Bronchiectasis with acute lower respiratory infection: Secondary | ICD-10-CM | POA: Diagnosis not present

## 2022-01-26 DIAGNOSIS — R634 Abnormal weight loss: Secondary | ICD-10-CM | POA: Diagnosis not present

## 2022-01-26 DIAGNOSIS — F03A0 Unspecified dementia, mild, without behavioral disturbance, psychotic disturbance, mood disturbance, and anxiety: Secondary | ICD-10-CM | POA: Diagnosis not present

## 2022-01-26 DIAGNOSIS — Z9981 Dependence on supplemental oxygen: Secondary | ICD-10-CM | POA: Diagnosis not present

## 2022-01-26 DIAGNOSIS — A310 Pulmonary mycobacterial infection: Secondary | ICD-10-CM | POA: Diagnosis not present

## 2022-01-28 DIAGNOSIS — J470 Bronchiectasis with acute lower respiratory infection: Secondary | ICD-10-CM | POA: Diagnosis not present

## 2022-01-28 DIAGNOSIS — A310 Pulmonary mycobacterial infection: Secondary | ICD-10-CM | POA: Diagnosis not present

## 2022-01-28 DIAGNOSIS — Z8709 Personal history of other diseases of the respiratory system: Secondary | ICD-10-CM | POA: Diagnosis not present

## 2022-01-28 DIAGNOSIS — R634 Abnormal weight loss: Secondary | ICD-10-CM | POA: Diagnosis not present

## 2022-01-28 DIAGNOSIS — F03A0 Unspecified dementia, mild, without behavioral disturbance, psychotic disturbance, mood disturbance, and anxiety: Secondary | ICD-10-CM | POA: Diagnosis not present

## 2022-01-28 DIAGNOSIS — Z9981 Dependence on supplemental oxygen: Secondary | ICD-10-CM | POA: Diagnosis not present

## 2022-02-01 ENCOUNTER — Telehealth: Payer: Self-pay | Admitting: Internal Medicine

## 2022-02-01 NOTE — Telephone Encounter (Signed)
Verbal given to [facility] via VM to proceed with palliative care for pt.

## 2022-02-01 NOTE — Telephone Encounter (Signed)
Sondra from [facility] called to let us know that patient no longer qualifies for hospice services under failure to thrive because she has gained weight and is doing much better. She does qualify for palliative care. They are requesting order for palliative care for symptom management be sent over today if at all possible. Call back number for Lujean Rave is [phone]

## 2022-02-02 DIAGNOSIS — J470 Bronchiectasis with acute lower respiratory infection: Secondary | ICD-10-CM | POA: Diagnosis not present

## 2022-02-02 DIAGNOSIS — F03A0 Unspecified dementia, mild, without behavioral disturbance, psychotic disturbance, mood disturbance, and anxiety: Secondary | ICD-10-CM | POA: Diagnosis not present

## 2022-02-02 DIAGNOSIS — Z9981 Dependence on supplemental oxygen: Secondary | ICD-10-CM | POA: Diagnosis not present

## 2022-02-02 DIAGNOSIS — A310 Pulmonary mycobacterial infection: Secondary | ICD-10-CM | POA: Diagnosis not present

## 2022-02-02 DIAGNOSIS — R634 Abnormal weight loss: Secondary | ICD-10-CM | POA: Diagnosis not present

## 2022-02-02 DIAGNOSIS — Z8709 Personal history of other diseases of the respiratory system: Secondary | ICD-10-CM | POA: Diagnosis not present

## 2022-02-07 NOTE — Telephone Encounter (Signed)
Mychart message sent by pt's daughter: [name]  P [facility] (supporting Tammy S Parrett, NP)50 minutes ago (9:04 AM)    Good morning,  Checking in to see if you recommend bringing my mother in for a chest x-ray. She's had an increase in phlegm and a fever of 100 last night. Crackles are present in mid-lower lobe of her right lung.  Please advise.   Thank you so much,  Julie Silva     No openings at the office today. Please advise what you recommend [place] doing.

## 2022-02-07 NOTE — Telephone Encounter (Signed)
She needs to COVID and flu swab. Let us know if positive. Use mucinex [phone] mg Twice daily or can use the liquid extra strength over the counter as directed. Restart xopenex nebs 1-2 times a day, as tolerated. Follow with hypertonic saline and flutter. Can use tylenol OTC for fevers. Needs next available OV. If symptoms worsen, go to the ED.

## 2022-02-11 ENCOUNTER — Ambulatory Visit (INDEPENDENT_AMBULATORY_CARE_PROVIDER_SITE_OTHER): Payer: Medicare Other | Admitting: Pulmonary Disease

## 2022-02-11 ENCOUNTER — Encounter: Payer: Self-pay | Admitting: Pulmonary Disease

## 2022-02-11 ENCOUNTER — Encounter: Payer: Self-pay | Admitting: Oncology

## 2022-02-11 VITALS — BP 130/62 | HR 79 | Temp 97.7°F | Ht 64.0 in | Wt 120.0 lb

## 2022-02-11 DIAGNOSIS — J479 Bronchiectasis, uncomplicated: Secondary | ICD-10-CM | POA: Diagnosis not present

## 2022-02-11 DIAGNOSIS — J471 Bronchiectasis with (acute) exacerbation: Secondary | ICD-10-CM | POA: Diagnosis not present

## 2022-02-11 MED ORDER — AMOXICILLIN-POT CLAVULANATE 400-57 MG/5ML PO SUSR: 800.0000 mg | Freq: Two times a day (BID) | ORAL | 0 refills | Status: DC

## 2022-02-11 MED ORDER — PREDNISONE 20 MG PO TABS: ORAL_TABLET | ORAL | 0 refills | Status: DC

## 2022-02-11 NOTE — Progress Notes (Signed)
Julie Silva    [id number]    [birth year]  Primary Care Physician:Jones, Arvid Right, MD  Referring Physician: Janith Lima, MD [address] [place],  [place] [address]  Chief complaint: Follow up for abnormal CT scan.  HPI: Julie Silva was previously followed in the pulmonary clinic for abnormal CT with bronchiectatic, tree-in-bud changes concerning for MAI.  She was last seen in 2018 At that time she was also on chronic nitrofurantoin for UTI for many years for chronic UTI  Recommendations were for conservative management as she was asymptomatic and not producing sputum Bronchoscopy or treatment for MAI was not recommended due to age, frailty, malnutrition with weight loss Suspicion for nitrofurantoin toxicity was low but she was taken off this medication and placed on Bactrim for UTI prophylaxis. She denies any signs and symptoms of connective tissue disease, autoimmune disease.  She is a lifelong non-smoker.  Interim History:  She has been dealing with chronic urinary tract infections with recurrent flareups on chronic antibiotics with cefdinir And imaging continues to show persistent lower lobe nodularity, cavitary lesion Complains of increasing cough and congestion for the past few weeks.  Denies any fevers and chills Continues on 3% saline.  Stopped Xopenex as it is causing tremors.  She was previously on hospice but came off as she gained some weight and is feeling stronger.  Current Outpatient Medications on File Prior to Visit  Medication Sig Dispense Refill   acetaminophen (TYLENOL) 500 MG tablet Take 500 mg by mouth 2 (two) times daily.     bimatoprost (LUMIGAN) 0.01 % SOLN Place 1 drop into both eyes at bedtime.     Catheters MISC Replace catheter twice daily. 180 each 1   cefdinir (OMNICEF) 250 MG/5ML suspension Take 250 mg by mouth daily.     dorzolamide-timolol (COSOPT) 22.3-6.8 MG/ML ophthalmic solution Place 1 drop into both eyes 2 (two) times daily.      dronabinol (MARINOL) 2.5 MG capsule Take 1 capsule (2.5 mg total) by mouth 2 (two) times daily before lunch and supper. 180 capsule 1   ELIQUIS 2.5 MG TABS tablet TAKE 1 TABLET TWICE A DAY 180 tablet 0   estradiol (ESTRACE) 0.1 MG/GM vaginal cream Place 1 Applicatorful vaginally at bedtime.     gabapentin (NEURONTIN) 100 MG capsule Take 3 capsules (300 mg total) by mouth 2 (two) times daily. TAKE 1 CAPSULE BY MOUTH EVERY MORNING AND TAKE 2 CAPSULES AT NIGHT 540 capsule 3   guaifenesin (ROBITUSSIN) 100 MG/5ML syrup Take 10 mLs by mouth 3 (three) times daily as needed for cough.     Melatonin 1 MG CHEW Chew 1 tablet by mouth as needed.     omeprazole (PRILOSEC) 20 MG capsule TAKE 1 CAPSULE BY MOUTH EVERY DAY 90 capsule 1   ondansetron (ZOFRAN-ODT) 4 MG disintegrating tablet Take 1 tablet (4 mg total) by mouth every 8 (eight) hours as needed. 20 tablet 0   sodium chloride HYPERTONIC 3 % nebulizer solution Take by nebulization 2 (two) times daily as needed for other. 750 mL 12   Wheat Dextrin (BENEFIBER PO) Take 1 mg by mouth daily.     No current facility-administered medications on file prior to visit.    Physical Exam: Blood pressure 130/62, pulse 79, temperature 97.7 F (36.5 C), temperature source Oral, height 5' 4"$  (1.626 m), weight 120 lb (54.4 kg), SpO2 97 %. Gen:      No acute distress HEENT:  EOMI,  sclera anicteric Neck:     No masses; no thyromegaly Lungs:   Right basilar crackle CV:         Regular rate and rhythm; no murmurs Abd:      + bowel sounds; soft, non-tender; no palpable masses, no distension Ext:    No edema; adequate peripheral perfusion Skin:      Warm and dry; no rash Neuro: alert and oriented x 3 Psych: normal mood and affect   Data Reviewed: CT scan  05/01/15- diffuse emphysematous changes, bilateral consolidative nodular opacities in the lower lobes, right middle lobe bronchiectasis, tree-in-bud opacity.   CT scan 07/31/15-  similar to above. Some  consolidative changes are worse when others show improvement. Images personally reviewed.   CT chest 03/19/2020-moderate bilateral bronchiectasis unchanged from before, persistent tree-in-bud with cavitary 2.1 cm right lower lobe nodule and a new 1.3 cm nodular consolidation in the right upper lobe.  High-resolution CT 09/25/2020-bronchiectasis, fibrosis in the right middle lobe, enlargement of right lower lobe cavitary lesion with new Lesion in the left lower lobe.  High resolution CT 06/10/2021-extensive consolidation, nodularity, cavitary lesions in the lower lobes  CT chest 12/15/2021-extensive centrilobular nodularity, tree-in-bud with bronchiectasis. I have reviewed the images personally.  Assessment:  Bronchiectasis, concern for MAI Acute exacerbation She has predominantly right middle lobe bronchiectasis, tree-in-bud opacities and consolidative changes in the bases. This is suggestive of MAI infection. She is using a flutter valve but she is unable to bring up secretions.  Continue flutter valve, Mucinex.  She cannot tolerate percussion vest as it causes dizziness Check sputum cultures for AFB and regular cultures Prescribe Augmentin and prednisone for 5 days for mild flare of bronchiectasis  She has been off nitrofurantoin since 2018 and is not an ongoing issue.  Plan/Recommendations: - Augmentin, prednisone - Continue flutter valve, saline nebs  Marshell Garfinkel MD [facility] Pager [phone] If no answer or after 3pm call: [phone] 02/11/2022, 4:01 PM  CC: Janith Lima, MD

## 2022-02-11 NOTE — Patient Instructions (Signed)
Will prescribe Augmentin 8 7 5 $ mg twice daily for 7 days Prednisone 40 mg a day for 5 days Continue the saline nebulizer and flutter valve Follow-up in 6 months

## 2022-02-22 NOTE — Progress Notes (Deleted)
    Julie Nylund T. Lynee Rosenbach, MD, [place] at [facility] [place], [address]  Phone: [phone]  FAX: [id number] - 89 y.o. female  MRN [id number]  Date of Birth: 1927-08-22  Date: 02/23/2022  PCP: Janith Lima, MD  Referral: Janith Lima, MD  No chief complaint on file.  Subjective:   Julie Silva is a 89 y.o. very pleasant female patient with There is no height or weight on file to calculate BMI. who presents with the following:  Patient presents with ongoing stomach issues.  Looks as if she has some chronic nausea and cachexia.  She is currently on Marinol 2.5 mg, omeprazole 20 mg.  She also has Zofran ODT.    Review of Systems is noted in the HPI, as appropriate  Objective:   There were no vitals taken for this visit.  GEN: No acute distress; alert,appropriate. PULM: Breathing comfortably in no respiratory distress PSYCH: Normally interactive.   Laboratory and Imaging Data:  Assessment and Plan:   ***

## 2022-02-23 ENCOUNTER — Ambulatory Visit: Payer: Medicare Other | Admitting: Family Medicine

## 2022-02-23 ENCOUNTER — Ambulatory Visit (INDEPENDENT_AMBULATORY_CARE_PROVIDER_SITE_OTHER): Payer: Medicare Other | Admitting: Family Medicine

## 2022-02-23 ENCOUNTER — Other Ambulatory Visit: Payer: Self-pay

## 2022-02-23 ENCOUNTER — Inpatient Hospital Stay (HOSPITAL_COMMUNITY)
Admission: EM | Admit: 2022-02-23 | Discharge: 2022-02-28 | DRG: 391 | Disposition: A | Discharge status: Home / Self Care | Payer: Medicare Other | Attending: Internal Medicine | Admitting: Internal Medicine

## 2022-02-23 ENCOUNTER — Ambulatory Visit
Admission: RE | Admit: 2022-02-23 | Discharge: 2022-02-23 | Disposition: A | Discharge status: Home / Self Care | Payer: Medicare Other | Source: Ambulatory Visit | Attending: Family Medicine | Admitting: Family Medicine

## 2022-02-23 ENCOUNTER — Encounter: Payer: Self-pay | Admitting: Family Medicine

## 2022-02-23 VITALS — BP 100/42 | HR 90 | Temp 98.3°F | Ht 64.0 in | Wt 118.1 lb

## 2022-02-23 DIAGNOSIS — K862 Cyst of pancreas: Secondary | ICD-10-CM | POA: Diagnosis present

## 2022-02-23 DIAGNOSIS — R197 Diarrhea, unspecified: Secondary | ICD-10-CM

## 2022-02-23 DIAGNOSIS — Z66 Do not resuscitate: Secondary | ICD-10-CM | POA: Diagnosis present

## 2022-02-23 DIAGNOSIS — I441 Atrioventricular block, second degree: Secondary | ICD-10-CM | POA: Diagnosis present

## 2022-02-23 DIAGNOSIS — E872 Acidosis, unspecified: Secondary | ICD-10-CM | POA: Diagnosis present

## 2022-02-23 DIAGNOSIS — Z79899 Other long term (current) drug therapy: Secondary | ICD-10-CM

## 2022-02-23 DIAGNOSIS — J9611 Chronic respiratory failure with hypoxia: Secondary | ICD-10-CM | POA: Diagnosis present

## 2022-02-23 DIAGNOSIS — M25552 Pain in left hip: Secondary | ICD-10-CM | POA: Diagnosis not present

## 2022-02-23 DIAGNOSIS — Z8041 Family history of malignant neoplasm of ovary: Secondary | ICD-10-CM

## 2022-02-23 DIAGNOSIS — Z808 Family history of malignant neoplasm of other organs or systems: Secondary | ICD-10-CM

## 2022-02-23 DIAGNOSIS — M1120 Other chondrocalcinosis, unspecified site: Secondary | ICD-10-CM | POA: Diagnosis present

## 2022-02-23 DIAGNOSIS — M1189 Other specified crystal arthropathies, multiple sites: Secondary | ICD-10-CM | POA: Diagnosis not present

## 2022-02-23 DIAGNOSIS — Z87440 Personal history of urinary (tract) infections: Secondary | ICD-10-CM

## 2022-02-23 DIAGNOSIS — N390 Urinary tract infection, site not specified: Secondary | ICD-10-CM | POA: Diagnosis present

## 2022-02-23 DIAGNOSIS — Z9181 History of falling: Secondary | ICD-10-CM

## 2022-02-23 DIAGNOSIS — K5720 Diverticulitis of large intestine with perforation and abscess without bleeding: Secondary | ICD-10-CM | POA: Diagnosis not present

## 2022-02-23 DIAGNOSIS — L89151 Pressure ulcer of sacral region, stage 1: Secondary | ICD-10-CM | POA: Diagnosis present

## 2022-02-23 DIAGNOSIS — M7062 Trochanteric bursitis, left hip: Secondary | ICD-10-CM | POA: Diagnosis present

## 2022-02-23 DIAGNOSIS — Z950 Presence of cardiac pacemaker: Secondary | ICD-10-CM

## 2022-02-23 DIAGNOSIS — M25551 Pain in right hip: Secondary | ICD-10-CM | POA: Diagnosis present

## 2022-02-23 DIAGNOSIS — I2510 Atherosclerotic heart disease of native coronary artery without angina pectoris: Secondary | ICD-10-CM | POA: Diagnosis present

## 2022-02-23 DIAGNOSIS — A310 Pulmonary mycobacterial infection: Secondary | ICD-10-CM | POA: Diagnosis present

## 2022-02-23 DIAGNOSIS — Z85828 Personal history of other malignant neoplasm of skin: Secondary | ICD-10-CM

## 2022-02-23 DIAGNOSIS — I10 Essential (primary) hypertension: Secondary | ICD-10-CM | POA: Diagnosis not present

## 2022-02-23 DIAGNOSIS — R531 Weakness: Secondary | ICD-10-CM

## 2022-02-23 DIAGNOSIS — E440 Moderate protein-calorie malnutrition: Secondary | ICD-10-CM | POA: Diagnosis not present

## 2022-02-23 DIAGNOSIS — N179 Acute kidney failure, unspecified: Secondary | ICD-10-CM | POA: Diagnosis not present

## 2022-02-23 DIAGNOSIS — Z6820 Body mass index (BMI) 20.0-20.9, adult: Secondary | ICD-10-CM

## 2022-02-23 DIAGNOSIS — M9905 Segmental and somatic dysfunction of pelvic region: Secondary | ICD-10-CM | POA: Diagnosis not present

## 2022-02-23 DIAGNOSIS — I5189 Other ill-defined heart diseases: Secondary | ICD-10-CM

## 2022-02-23 DIAGNOSIS — K5792 Diverticulitis of intestine, part unspecified, without perforation or abscess without bleeding: Secondary | ICD-10-CM | POA: Diagnosis not present

## 2022-02-23 DIAGNOSIS — N1831 Chronic kidney disease, stage 3a: Secondary | ICD-10-CM | POA: Insufficient documentation

## 2022-02-23 DIAGNOSIS — E43 Unspecified severe protein-calorie malnutrition: Secondary | ICD-10-CM | POA: Diagnosis present

## 2022-02-23 DIAGNOSIS — I4891 Unspecified atrial fibrillation: Secondary | ICD-10-CM | POA: Diagnosis not present

## 2022-02-23 DIAGNOSIS — Z9049 Acquired absence of other specified parts of digestive tract: Secondary | ICD-10-CM

## 2022-02-23 DIAGNOSIS — M810 Age-related osteoporosis without current pathological fracture: Secondary | ICD-10-CM | POA: Diagnosis present

## 2022-02-23 DIAGNOSIS — J479 Bronchiectasis, uncomplicated: Secondary | ICD-10-CM

## 2022-02-23 DIAGNOSIS — H9193 Unspecified hearing loss, bilateral: Secondary | ICD-10-CM | POA: Diagnosis present

## 2022-02-23 DIAGNOSIS — E785 Hyperlipidemia, unspecified: Secondary | ICD-10-CM | POA: Diagnosis present

## 2022-02-23 DIAGNOSIS — R1030 Lower abdominal pain, unspecified: Secondary | ICD-10-CM

## 2022-02-23 DIAGNOSIS — E876 Hypokalemia: Secondary | ICD-10-CM | POA: Diagnosis not present

## 2022-02-23 DIAGNOSIS — D631 Anemia in chronic kidney disease: Secondary | ICD-10-CM | POA: Diagnosis present

## 2022-02-23 DIAGNOSIS — Z832 Family history of diseases of the blood and blood-forming organs and certain disorders involving the immune mechanism: Secondary | ICD-10-CM

## 2022-02-23 DIAGNOSIS — R1310 Dysphagia, unspecified: Secondary | ICD-10-CM

## 2022-02-23 DIAGNOSIS — I480 Paroxysmal atrial fibrillation: Secondary | ICD-10-CM | POA: Diagnosis present

## 2022-02-23 DIAGNOSIS — R059 Cough, unspecified: Secondary | ICD-10-CM | POA: Diagnosis not present

## 2022-02-23 DIAGNOSIS — E46 Unspecified protein-calorie malnutrition: Secondary | ICD-10-CM | POA: Diagnosis present

## 2022-02-23 DIAGNOSIS — Z803 Family history of malignant neoplasm of breast: Secondary | ICD-10-CM

## 2022-02-23 DIAGNOSIS — Z79890 Hormone replacement therapy (postmenopausal): Secondary | ICD-10-CM

## 2022-02-23 DIAGNOSIS — I495 Sick sinus syndrome: Secondary | ICD-10-CM | POA: Diagnosis present

## 2022-02-23 DIAGNOSIS — R9431 Abnormal electrocardiogram [ECG] [EKG]: Secondary | ICD-10-CM | POA: Diagnosis not present

## 2022-02-23 DIAGNOSIS — R079 Chest pain, unspecified: Secondary | ICD-10-CM | POA: Diagnosis not present

## 2022-02-23 DIAGNOSIS — R109 Unspecified abdominal pain: Secondary | ICD-10-CM | POA: Diagnosis not present

## 2022-02-23 DIAGNOSIS — Z888 Allergy status to other drugs, medicaments and biological substances status: Secondary | ICD-10-CM

## 2022-02-23 DIAGNOSIS — Z823 Family history of stroke: Secondary | ICD-10-CM

## 2022-02-23 DIAGNOSIS — Z7901 Long term (current) use of anticoagulants: Secondary | ICD-10-CM

## 2022-02-23 DIAGNOSIS — Z90710 Acquired absence of both cervix and uterus: Secondary | ICD-10-CM

## 2022-02-23 DIAGNOSIS — K5730 Diverticulosis of large intestine without perforation or abscess without bleeding: Secondary | ICD-10-CM | POA: Diagnosis not present

## 2022-02-23 DIAGNOSIS — Z974 Presence of external hearing-aid: Secondary | ICD-10-CM

## 2022-02-23 LAB — CBC WITH DIFFERENTIAL/PLATELET
Basophils Absolute: 0 10*3/uL (ref 0.0–0.1)
Basophils Relative: 0.3 % (ref 0.0–3.0)
Eosinophils Absolute: 0.1 10*3/uL (ref 0.0–0.7)
Eosinophils Relative: 0.5 % (ref 0.0–5.0)
HCT: 36.1 % (ref 36.0–46.0)
Hemoglobin: 11.6 g/dL — ABNORMAL LOW (ref 12.0–15.0)
Lymphocytes Relative: 10.8 % — ABNORMAL LOW (ref 12.0–46.0)
Lymphs Abs: 1.5 10*3/uL (ref 0.7–4.0)
MCHC: 32.2 g/dL (ref 30.0–36.0)
MCV: 91.6 fl (ref 78.0–100.0)
Monocytes Absolute: 1.4 10*3/uL — ABNORMAL HIGH (ref 0.1–1.0)
Monocytes Relative: 9.9 % (ref 3.0–12.0)
Neutro Abs: 11.2 10*3/uL — ABNORMAL HIGH (ref 1.4–7.7)
Neutrophils Relative %: 78.5 % — ABNORMAL HIGH (ref 43.0–77.0)
Platelets: 317 10*3/uL (ref 150.0–400.0)
RBC: 3.94 Mil/uL (ref 3.87–5.11)
RDW: 13.5 % (ref 11.5–15.5)
WBC: 14.2 10*3/uL — ABNORMAL HIGH (ref 4.0–10.5)

## 2022-02-23 LAB — HEPATIC FUNCTION PANEL
ALT: 14 U/L (ref 0–35)
AST: 18 U/L (ref 0–37)
Albumin: 3.7 g/dL (ref 3.5–5.2)
Alkaline Phosphatase: 93 U/L (ref 39–117)
Bilirubin, Direct: 0.1 mg/dL (ref 0.0–0.3)
Total Bilirubin: 0.5 mg/dL (ref 0.2–1.2)
Total Protein: 7.7 g/dL (ref 6.0–8.3)

## 2022-02-23 LAB — BASIC METABOLIC PANEL
BUN: 40 mg/dL — ABNORMAL HIGH (ref 6–23)
CO2: 26 mEq/L (ref 19–32)
Calcium: 9.6 mg/dL (ref 8.4–10.5)
Chloride: 102 mEq/L (ref 96–112)
Creatinine, Ser: 0.99 mg/dL (ref 0.40–1.20)
GFR: 49.43 mL/min — ABNORMAL LOW (ref >60.00)
Glucose, Bld: 112 mg/dL — ABNORMAL HIGH (ref 70–99)
Potassium: 5 mEq/L (ref 3.5–5.1)
Sodium: 137 mEq/L (ref 135–145)

## 2022-02-23 LAB — LIPASE: Lipase: 37 U/L (ref 11.0–59.0)

## 2022-02-23 LAB — LACTIC ACID, PLASMA: Lactic Acid, Venous: 1.4 mmol/L (ref 0.5–1.9)

## 2022-02-23 MED ORDER — IOHEXOL 300 MG/ML  SOLN
75.0000 mL | Freq: Once | INTRAMUSCULAR | Status: AC | PRN
  Administered 2022-02-23: 75 mL via INTRAVENOUS

## 2022-02-23 NOTE — ED Triage Notes (Addendum)
Patient referred to ED after a CT scan showed diverticulitis with a possible  microperforation. Imaging was ordered by PCP to evaluate abdominal pain and diarrhea that started approximately four days ago. Patient alert and in no apparent distress at this time.

## 2022-02-23 NOTE — ED Provider Triage Note (Signed)
Emergency Medicine Provider Triage Evaluation Note  Julie Silva , a 89 y.o. female  was evaluated in triage.  Pt complains of diverticulitis with microperforation.  Patient here with daughters who provide history.  Per patient daughter, the patient has had abdominal pain, nausea, vomiting and diarrhea for the past unknown amount of time.  Patient was seen by PCP today, had labs drawn which showed leukocytosis.  Patient had CT scan done due to left lower quadrant pain which showed diverticulitis with microperforation.  Patient was sent here for further management.  Patient complaining of abdominal pain, nausea, vomiting, diarrhea.  Patient daughter reports patient has to catheterize to urinate secondary to vaginal surgery back in the 1990s.  Patient denies fevers.  Review of Systems  Positive:  Negative:   Physical Exam  There were no vitals taken for this visit. Gen:   Awake, no distress   Resp:  Normal effort  MSK:   Moves extremities without difficulty  Other:  LLQ TTP  Medical Decision Making  Medically screening exam initiated at 5:35 PM.  Appropriate orders placed.  Samanda S Busbin was informed that the remainder of the evaluation will be completed by another provider, this initial triage assessment does not replace that evaluation, and the importance of remaining in the ED until their evaluation is complete.     Azucena Cecil, PA-C 02/23/22 1736

## 2022-02-23 NOTE — Progress Notes (Addendum)
Wai Minotti T. Carrell Rahmani, MD, [place] at [facility] [place], [address]  Phone: [phone]  FAX: [phone]  Julie Silva - 89 y.o. female  MRN [id number]  Date of Birth: 28-Apr-1927  Date: 02/23/2022  PCP: Janith Lima, MD  Referral: Janith Lima, MD  Chief Complaint  Patient presents with   Diarrhea    Recently on Augmentin and Prednisone Daughter concerned for partial obstruction   Fever   Abdominal Pain    Tenderness in Belly   Hip Pain    Left-Had terrible cramp on hip yesterday and now in a lot of pain   Subjective:   [place] Julie Silva is a 89 y.o. very pleasant female patient with Body mass index is 20.28 kg/m. who presents with the following:  The patient presents with acute lower quadrant abdominal pain that has been present now for 4 days.  She has generally been unwell, and she just left hospice care 2 weeks ago.  She does have chronic COPD, but she is not O2 dependent right now.  Abdominal pain and diarrhea. Fever up to 101.5 last night.  Also tachycardic to about 110.   Has had diarrhea for several days.  Had been on Augmentin and then started to get diarrhea - given Augmentin and prednisone from pulmonology.  She does not use oxygen everyday.  After going off of Augmentin, still was not breathing as well compared to when she was on prednisone.   Now with lower abdominal tenderness.  Tenderness has been there for about four days.  Then all of the sudden lower abd is sore.   Not eating as much, drinking just a little.   Has tried compazine and zofran, and these do not help.   Also with chronic MAI.  1990's had a major bladder repair, and it will bleed some.    While on Augmentin, diarrhrea everyday.  Takes fiber everyday. Seemed as if everything more or less slowed down from the GI.  Little pieces of pus in the stool.  History of diverticulitis.   Review of Systems is noted in the HPI, as  appropriate  Patient Active Problem List   Diagnosis Date Noted   Protein calorie malnutrition ([place]) 11/08/2021   Nausea in adult 08/17/2021   Encounter for palliative care involving management of pain 07/21/2021   Chronic respiratory failure with hypoxia ([place]) 06/24/2021   Malignant cachexia ([place]) 06/22/2021   Pressure injury of sacral region, stage 1 06/22/2021   Pulmonary cachexia due to COPD ([place]) 06/22/2021   Iron deficiency anemia 04/14/2021   Bronchiectasis ([place]) 04/13/2021   A-fib ([place]) 04/13/2021   Atherosclerosis of aorta ([place]) 07/31/2020   Pseudogout involving multiple joints 12/11/2018   Abnormal CT scan, chest 10/06/2015   Intraductal papillary adenocarcinoma of female breast 04/29/2015   Abnormal chest x-ray with multiple lung nodules 0000000   Diastolic dysfunction [id number]   Second degree AV block 03/25/2014   Gastroesophageal reflux disease with esophagitis 07/31/2013   Dysphagia 07/31/2013   Essential hypertension, benign 05/07/2013   Hyperlipidemia with target LDL less than 130 05/07/2013   SVT (supraventricular tachycardia) 04/16/2013   Urinary retention 02/03/2013   Osteopenia 02/03/2013    Past Medical History:  Diagnosis Date   Anemia    Aortic atherosclerosis (HCC)    Arthritis    Osteoporosis, neck stiffness   Atrial fibrillation ([place])    Bleeds easily [facility])    "father was a hemophiliac" -  pt not being bothered in recent years.   CAD (coronary artery disease)    Calcium deposit in bursa of knee 2017   Cancer ([place])    skin cancer "basal cell".   Chicken pox    Cyst, Baker's knee    left knee- tx. Cortisone injection 05-05-15 in office- "improved pain relief and swelling left ankle and knee"   Diverticulitis    Diverticulosis    Dysrhythmia    Dr. McAlhany-cardiology   GERD (gastroesophageal reflux disease)    Glaucoma of both eyes    Gout    Hearing loss of both ears    Bilateral ears- hearing aids used- right ear hearing betther than  left.   Hiatal hernia    History of blood transfusion 1957   "lots; most related to my periods"-last 1968 -s/p Hysterectomy   Migraines    "years ago" (02/04/2013)   Motion sickness    back seat - cars, sudden movements   PAF (paroxysmal atrial fibrillation) (HCC)    Pancreatic cyst    Pelvic floor dysfunction    PONV (postoperative nausea and vomiting)    Risk for falls    "wobbley" per daughter   Self-catheterizes urinary bladder    "daily- retained urine and chronic UTI"   SVT (supraventricular tachycardia)    Dr. Darnell Level. Taylor-follows "loop recorder left chest"   Swallowing difficulty    freq occ. mostley liquids   Urine incontinence    UTI (urinary tract infection)    Chronic Urinary tract infections- tx Macrobid daily.   Vertigo    Wears hearing aid in both ears     Past Surgical History:  Procedure Laterality Date   APPENDECTOMY  01/03/1946   BLADDER SURGERY  01/03/1993   Repair-    BREAST BIOPSY Bilateral    "both were fine"   CATARACT EXTRACTION W/ INTRAOCULAR LENS IMPLANT Left    CATARACT EXTRACTION W/PHACO Right 10/16/2018   Procedure: CATARACT EXTRACTION PHACO AND INTRAOCULAR LENS PLACEMENT ([place])  RIGHT MiLoop diabetes 01:05.8  21.0%  13.77;  Surgeon: Birder Robson, MD;  Location: [facility];  Service: Ophthalmology;  Laterality: Right;  BS tends to drop in AM and would like early surgery so she can get home to eat, please   CHOLECYSTECTOMY  01/04/2012   COMBINED HYSTERECTOMY ABDOMINAL W/ A&P REPAIR / OOPHORECTOMY     DILATION AND CURETTAGE OF UTERUS     EP IMPLANTABLE DEVICE N/A 06/19/2014   Procedure: Loop Recorder Insertion;  Surgeon: Evans Lance, MD;  Location: [facility];  Service: Cardiovascular;  Laterality: N/A;   EXCISION OF BREAST BIOPSY Right 05/13/2015   Procedure: RIGHT BREAST EXCISIONAL BIOPSY x2;  Surgeon: Armandina Gemma, MD;  Location: [facility];  Service: General;  Laterality: Right;   PACEMAKER IMPLANT  02/2020   TONSILLECTOMY   01/04/1944   TOTAL ABDOMINAL HYSTERECTOMY     VAGINAL HYSTERECTOMY  01/03/1966   vaginal mesh      Family History  Problem Relation Age of Onset   Stroke Father    CVA Father    Ovarian cancer Mother    Cancer - Other Sister        Breast   Breast cancer Sister    Cancer - Other Brother        Throat   Cancer - Other Sister        Throat   Brain cancer Sister    Cancer - Other Other        Brain  Breast cancer Other     Social History   Social History Narrative   Lives alone in a one story home.  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      Objective:   BP (!) 100/42   Pulse 90   Temp 98.3 F (36.8 C) (Temporal)   Ht 5' 4"$  (1.626 m)   Wt 118 lb 2 oz (53.6 kg)   SpO2 96%   BMI 20.28 kg/m   GEN: No acute distress; alert,appropriate. PULM: Breathing comfortably in no respiratory distress PSYCH: Normally interactive.  CV: RRR, no m/g/r  PULM: Normal respiratory rate, no accessory muscle use. No wheezes, crackles, modest rhonchi at the bases ABD: S-mildly rigid in the lower quadrants, notably tender in the right and left lower quadrants of the abdomen, ND, + BS, No rebound, No HSM   Laboratory and Imaging Data: CT Abdomen Pelvis W Contrast  Result Date: 02/23/2022 CLINICAL DATA:  Acute abdominal pain nonlocalized lower abdomen. Diarrhea. Weakness EXAM: CT ABDOMEN AND PELVIS WITH CONTRAST TECHNIQUE: Multidetector CT imaging of the abdomen and pelvis was performed using the standard protocol following bolus administration of intravenous contrast. RADIATION DOSE REDUCTION: This exam was performed according to the departmental dose-optimization program which includes automated exposure control, adjustment of the mA and/or kV according to patient size and/or use of iterative reconstruction technique. CONTRAST:  3m OMNIPAQUE IOHEXOL 300 MG/ML  SOLN COMPARISON:  CT 08/07/2021 and older FINDINGS: Lower chest: As seen on the prior examination there areas of nodularity with  opacity along the right lower lobe. Please correlate with prior workup. This would have a differential including aggressive process. This is similar to the recent chest CT of 12/15/2021. Stable leadless pacemaker again noted along the anterior margin of the right ventricle. Hepatobiliary: Previous cholecystectomy. Minimal ectasia of the biliary tree. No space-occupying liver lesion. Pancreas: Small cysts again seen along the midbody of the pancreas inferiorly, unchanged from previous measuring up to 12 mm. These appear slightly smaller today. Spleen: Splenic granulomas. Adrenals/Urinary Tract: Adrenal glands are preserved. Mild bilateral renal atrophy with stable Bosniak 1 bilateral small renal cysts. No specific imaging follow-up. The ureters have normal course and caliber extending down to the bladder. Stomach/Bowel: Colonic diverticulosis identified. There is wall thickening and stranding along the descending colon consistent with an area of diverticulitis. On sagittal image 90 of series 6 there are some tiny bolus of air immediately adjacent to the wall of the bowel in this location with confluence stranding. This could represent a microperforation. This area of ill-defined fluid on axial image 56 of series 2 measures 2.3 by 1.5 cm. More proximally the colon is nondilated. Small hiatal hernia. The small bowel is nondilated. Vascular/Lymphatic: Normal caliber aorta and IVC. Scattered vascular calcifications identified. There are areas of stenosis suggested along the origin of the right renal artery. Please correlate for any level of hypertension. Reproductive: Uterus itself is absent. No adnexal mass. As seen previously there is again elongation of the vaginal canal extending to the presacral region. Again seen on coronal and sagittal images. Sagittal image 61 of series 6. History of vaginal pexy. Again the etiology of the luminal gas and fluid within this structure is uncertain. There are some adjacent loops of  small bowel and some mild stranding. Other: No abdominal wall hernia or abnormality. No abdominopelvic ascites. Musculoskeletal: Degenerative changes seen of the spine and pelvis. IMPRESSION: Colonic diverticulosis with an area of inflammatory stranding and wall thickening along the mid descending colon consistent with an  area of diverticulitis. Few bubbles of air immediately adjacent to the bowel wall posteriorly in this location could represent microperforation with early phlegmonous change. No widespread free air or obstruction. Recommend follow-up to confirm clearance. Stable changes of vaginal pexy with gas and fluid in the vagina extending up to the presacral region with some stranding. Please correlate with any prior workup. Stable cystic lesions along the midbody of the pancreas. In light of the patient's age simple follow up in 2 years could be performed. Masslike opacities in the right lower lobe of the lung as on prior CT of the chest from December 2023. Electronically Signed   By: Jill Side M.D.   On: 02/23/2022 15:19     Assessment and Plan:     ICD-10-CM   1. Lower abdominal pain  R10.30 CT Abdomen Pelvis W Contrast    Basic metabolic panel    CBC with Differential/Platelet    Hepatic function panel    Lipase    CANCELED: Basic metabolic panel    CANCELED: CBC with Differential/Platelet    CANCELED: Hepatic function panel    CANCELED: Lipase    2. Diarrhea, unspecified type  R19.7 CT Abdomen Pelvis W Contrast    3. Weakness  R53.1 CT Abdomen Pelvis W Contrast    Basic metabolic panel    CBC with Differential/Platelet    Hepatic function panel    Lipase    CANCELED: Basic metabolic panel    CANCELED: CBC with Differential/Platelet    CANCELED: Hepatic function panel    CANCELED: Lipase     I am predominantly worried about her acute abdominal pain in the right and left lower quadrants.  Multiple etiologies are possible including bowel obstruction or diverticulitis.  Other  etiologies are also certainly possible, and this deserves a dedicated CT of the abdomen and pelvis with contrast to evaluate.  Diarrhea seems to be most likely secondary to Augmentin.  This has improved somewhat since she has been off of antibiotics.  She is fairly weak and does not feel good in general.  Addendum: 02/23/22 3:47 PM  At the time of this dictation, the patient CT scan has returned.  She has acute diverticulitis, and also looks like she has a small perforation.  Given all the above, 89 years old, white count 14,000 with what appears to be a small perforation, I think that she needs a higher level of care.  I called and talked with the patient's daughter, and she is going to take her to [facility] emergency room.  I have called the [facility] emergency room providers, and let them know that she is on the way.  Medication Management during today's office visit: No orders of the defined types were placed in this encounter.  Medications Discontinued During This Encounter  Medication Reason   amoxicillin-clavulanate (AUGMENTIN) 400-57 MG/5ML suspension Completed Course   predniSONE (DELTASONE) 20 MG tablet Completed Course   gabapentin (NEURONTIN) 123XX123 MG capsule Duplicate    Orders placed today for conditions managed today: Orders Placed This Encounter  Procedures   CT Abdomen Pelvis W Contrast   Basic metabolic panel   CBC with Differential/Platelet   Hepatic function panel   Lipase    Disposition: No follow-ups on file.  [employer] One speech-to-text software was used for transcription in this dictation.  Possible transcriptional errors can occur using Editor, commissioning.   Signed,  Maud Deed. Reneka Nebergall, MD   Outpatient Encounter Medications as of 02/23/2022  Medication Sig  acetaminophen (TYLENOL) 500 MG tablet Take 500 mg by mouth 2 (two) times daily.   bimatoprost (LUMIGAN) 0.01 % SOLN Place 1 drop into both eyes at bedtime.   Catheters MISC Replace catheter twice  daily.   cefdinir (OMNICEF) 250 MG/5ML suspension Take 250 mg by mouth daily.   dorzolamide-timolol (COSOPT) 22.3-6.8 MG/ML ophthalmic solution Place 1 drop into both eyes 2 (two) times daily.   dronabinol (MARINOL) 2.5 MG capsule Take 1 capsule (2.5 mg total) by mouth 2 (two) times daily before lunch and supper.   ELIQUIS 2.5 MG TABS tablet TAKE 1 TABLET TWICE A DAY   estradiol (ESTRACE) 0.1 MG/GM vaginal cream Place 1 Applicatorful vaginally at bedtime.   gabapentin (NEURONTIN) 100 MG capsule Take 300 mg by mouth 2 (two) times daily.   guaifenesin (ROBITUSSIN) 100 MG/5ML syrup Take 10 mLs by mouth 3 (three) times daily as needed for cough.   Melatonin 1 MG CHEW Chew 1 tablet by mouth as needed.   omeprazole (PRILOSEC) 20 MG capsule TAKE 1 CAPSULE BY MOUTH EVERY DAY   ondansetron (ZOFRAN-ODT) 4 MG disintegrating tablet Take 1 tablet (4 mg total) by mouth every 8 (eight) hours as needed.   prochlorperazine (COMPAZINE) 10 MG tablet Take 10 mg by mouth every 4 (four) hours as needed.   sodium chloride HYPERTONIC 3 % nebulizer solution Take by nebulization 2 (two) times daily as needed for other.   Wheat Dextrin (BENEFIBER PO) Take 1 mg by mouth daily.   [DISCONTINUED] amoxicillin-clavulanate (AUGMENTIN) 400-57 MG/5ML suspension Take 10 mLs (800 mg total) by mouth 2 (two) times daily.   [DISCONTINUED] gabapentin (NEURONTIN) 100 MG capsule Take 3 capsules (300 mg total) by mouth 2 (two) times daily. TAKE 1 CAPSULE BY MOUTH EVERY MORNING AND TAKE 2 CAPSULES AT NIGHT   [DISCONTINUED] predniSONE (DELTASONE) 20 MG tablet Take 53m for 5 days   No facility-administered encounter medications on file as of 02/23/2022.

## 2022-02-24 ENCOUNTER — Encounter (HOSPITAL_COMMUNITY): Payer: Self-pay | Admitting: Family Medicine

## 2022-02-24 ENCOUNTER — Emergency Department (HOSPITAL_COMMUNITY): Payer: Medicare Other

## 2022-02-24 ENCOUNTER — Other Ambulatory Visit: Payer: Self-pay

## 2022-02-24 ENCOUNTER — Observation Stay (HOSPITAL_COMMUNITY): Payer: Medicare Other

## 2022-02-24 DIAGNOSIS — M1189 Other specified crystal arthropathies, multiple sites: Secondary | ICD-10-CM

## 2022-02-24 DIAGNOSIS — I480 Paroxysmal atrial fibrillation: Secondary | ICD-10-CM

## 2022-02-24 DIAGNOSIS — R109 Unspecified abdominal pain: Secondary | ICD-10-CM | POA: Diagnosis not present

## 2022-02-24 DIAGNOSIS — R059 Cough, unspecified: Secondary | ICD-10-CM | POA: Diagnosis not present

## 2022-02-24 DIAGNOSIS — J479 Bronchiectasis, uncomplicated: Secondary | ICD-10-CM

## 2022-02-24 DIAGNOSIS — M25552 Pain in left hip: Secondary | ICD-10-CM | POA: Diagnosis not present

## 2022-02-24 DIAGNOSIS — E440 Moderate protein-calorie malnutrition: Secondary | ICD-10-CM | POA: Diagnosis not present

## 2022-02-24 DIAGNOSIS — K5720 Diverticulitis of large intestine with perforation and abscess without bleeding: Secondary | ICD-10-CM

## 2022-02-24 DIAGNOSIS — N1831 Chronic kidney disease, stage 3a: Secondary | ICD-10-CM

## 2022-02-24 DIAGNOSIS — I4891 Unspecified atrial fibrillation: Secondary | ICD-10-CM | POA: Diagnosis not present

## 2022-02-24 DIAGNOSIS — I10 Essential (primary) hypertension: Secondary | ICD-10-CM | POA: Diagnosis not present

## 2022-02-24 DIAGNOSIS — I495 Sick sinus syndrome: Secondary | ICD-10-CM | POA: Diagnosis not present

## 2022-02-24 DIAGNOSIS — R079 Chest pain, unspecified: Secondary | ICD-10-CM | POA: Diagnosis not present

## 2022-02-24 DIAGNOSIS — M25551 Pain in right hip: Secondary | ICD-10-CM | POA: Diagnosis not present

## 2022-02-24 DIAGNOSIS — K5792 Diverticulitis of intestine, part unspecified, without perforation or abscess without bleeding: Secondary | ICD-10-CM | POA: Diagnosis present

## 2022-02-24 LAB — CBC
HCT: 33 % — ABNORMAL LOW (ref 36.0–46.0)
Hemoglobin: 10.8 g/dL — ABNORMAL LOW (ref 12.0–15.0)
MCH: 29.7 pg (ref 26.0–34.0)
MCHC: 32.7 g/dL (ref 30.0–36.0)
MCV: 90.7 fL (ref 80.0–100.0)
Platelets: 277 10*3/uL (ref 150–400)
RBC: 3.64 MIL/uL — ABNORMAL LOW (ref 3.87–5.11)
RDW: 13.3 % (ref 11.5–15.5)
WBC: 10.5 10*3/uL (ref 4.0–10.5)
nRBC: 0 % (ref 0.0–0.2)

## 2022-02-24 LAB — COMPREHENSIVE METABOLIC PANEL
ALT: 16 U/L (ref 0–44)
AST: 19 U/L (ref 15–41)
Albumin: 2.7 g/dL — ABNORMAL LOW (ref 3.5–5.0)
Alkaline Phosphatase: 95 U/L (ref 38–126)
Anion gap: 14 (ref 5–15)
BUN: 30 mg/dL — ABNORMAL HIGH (ref 8–23)
CO2: 19 mmol/L — ABNORMAL LOW (ref 22–32)
Calcium: 8.6 mg/dL — ABNORMAL LOW (ref 8.9–10.3)
Chloride: 102 mmol/L (ref 98–111)
Creatinine, Ser: 1.13 mg/dL — ABNORMAL HIGH (ref 0.44–1.00)
GFR, Estimated: 46 mL/min — ABNORMAL LOW (ref >60)
Glucose, Bld: 100 mg/dL — ABNORMAL HIGH (ref 70–99)
Potassium: 3.6 mmol/L (ref 3.5–5.1)
Sodium: 135 mmol/L (ref 135–145)
Total Bilirubin: 0.9 mg/dL (ref 0.3–1.2)
Total Protein: 6.9 g/dL (ref 6.5–8.1)

## 2022-02-24 LAB — APTT
aPTT: 48 seconds — ABNORMAL HIGH (ref 24–36)
aPTT: 81 seconds — ABNORMAL HIGH (ref 24–36)

## 2022-02-24 LAB — HEPARIN LEVEL (UNFRACTIONATED): Heparin Unfractionated: >1.1 IU/mL — ABNORMAL HIGH (ref 0.30–0.70)

## 2022-02-24 LAB — PROTIME-INR
INR: 1.1 (ref 0.8–1.2)
Prothrombin Time: 13.7 seconds (ref 11.4–15.2)

## 2022-02-24 LAB — MAGNESIUM: Magnesium: 2.1 mg/dL (ref 1.7–2.4)

## 2022-02-24 MED ORDER — HYDRALAZINE HCL 20 MG/ML IJ SOLN: 5.0000 mg | INTRAMUSCULAR | Status: DC | PRN

## 2022-02-24 MED ORDER — HEPARIN SODIUM (PORCINE) 5000 UNIT/ML IJ SOLN: 5000.0000 [IU] | Freq: Three times a day (TID) | INTRAMUSCULAR | Status: DC

## 2022-02-24 MED ORDER — APIXABAN 2.5 MG PO TABS: 2.5000 mg | ORAL_TABLET | Freq: Two times a day (BID) | ORAL | Status: DC

## 2022-02-24 MED ORDER — PIPERACILLIN-TAZOBACTAM 3.375 G IVPB 30 MIN
3.3750 g | Freq: Once | INTRAVENOUS | Status: AC
  Administered 2022-02-24: 3.375 g via INTRAVENOUS
  Filled 2022-02-24: qty 50

## 2022-02-24 MED ORDER — ONDANSETRON HCL 4 MG PO TABS: 4.0000 mg | ORAL_TABLET | Freq: Four times a day (QID) | ORAL | Status: DC | PRN

## 2022-02-24 MED ORDER — MORPHINE SULFATE (PF) 2 MG/ML IV SOLN: 1.0000 mg | INTRAVENOUS | Status: DC | PRN

## 2022-02-24 MED ORDER — ONDANSETRON HCL 4 MG/2ML IJ SOLN: 4.0000 mg | Freq: Four times a day (QID) | INTRAMUSCULAR | Status: DC | PRN

## 2022-02-24 MED ORDER — PANTOPRAZOLE SODIUM 40 MG PO TBEC
40.0000 mg | DELAYED_RELEASE_TABLET | Freq: Every day | ORAL | Status: DC
  Administered 2022-02-25 – 2022-02-28 (×4): 40 mg via ORAL
  Filled 2022-02-24 (×4): qty 1

## 2022-02-24 MED ORDER — IPRATROPIUM-ALBUTEROL 0.5-2.5 (3) MG/3ML IN SOLN: 3.0000 mL | Freq: Two times a day (BID) | RESPIRATORY_TRACT | Status: DC | PRN

## 2022-02-24 MED ORDER — ACETAMINOPHEN 325 MG PO TABS: 650.0000 mg | ORAL_TABLET | Freq: Four times a day (QID) | ORAL | Status: DC | PRN

## 2022-02-24 MED ORDER — PIPERACILLIN-TAZOBACTAM 3.375 G IVPB
3.3750 g | Freq: Three times a day (TID) | INTRAVENOUS | Status: DC
  Administered 2022-02-24 – 2022-02-27 (×9): 3.375 g via INTRAVENOUS
  Filled 2022-02-24 (×10): qty 50

## 2022-02-24 MED ORDER — GUAIFENESIN 100 MG/5ML PO LIQD
10.0000 mL | Freq: Three times a day (TID) | ORAL | Status: DC
  Administered 2022-02-24 – 2022-02-28 (×11): 10 mL via ORAL
  Filled 2022-02-24 (×12): qty 15

## 2022-02-24 MED ORDER — GUAIFENESIN 100 MG/5ML PO LIQD: 5.0000 mL | ORAL | Status: DC | PRN

## 2022-02-24 MED ORDER — DORZOLAMIDE HCL-TIMOLOL MAL 2-0.5 % OP SOLN
1.0000 [drp] | Freq: Two times a day (BID) | OPHTHALMIC | Status: DC
  Administered 2022-02-24 – 2022-02-28 (×9): 1 [drp] via OPHTHALMIC
  Filled 2022-02-24 (×2): qty 10

## 2022-02-24 MED ORDER — SODIUM CHLORIDE 3 % IN NEBU
4.0000 mL | INHALATION_SOLUTION | Freq: Every day | RESPIRATORY_TRACT | Status: DC
  Administered 2022-02-24 – 2022-02-25 (×2): 4 mL via RESPIRATORY_TRACT
  Filled 2022-02-24 (×2): qty 4

## 2022-02-24 MED ORDER — ACETAMINOPHEN 650 MG RE SUPP: 650.0000 mg | Freq: Four times a day (QID) | RECTAL | Status: DC | PRN

## 2022-02-24 MED ORDER — GABAPENTIN 300 MG PO CAPS
300.0000 mg | ORAL_CAPSULE | Freq: Two times a day (BID) | ORAL | Status: DC
  Administered 2022-02-24 – 2022-02-28 (×9): 300 mg via ORAL
  Filled 2022-02-24 (×9): qty 1

## 2022-02-24 MED ORDER — DRONABINOL 2.5 MG PO CAPS
5.0000 mg | ORAL_CAPSULE | Freq: Two times a day (BID) | ORAL | Status: DC
  Administered 2022-02-24 – 2022-02-27 (×7): 5 mg via ORAL
  Filled 2022-02-24 (×7): qty 2

## 2022-02-24 MED ORDER — LATANOPROST 0.005 % OP SOLN
1.0000 [drp] | Freq: Every day | OPHTHALMIC | Status: DC
  Administered 2022-02-24 – 2022-02-27 (×4): 1 [drp] via OPHTHALMIC
  Filled 2022-02-24: qty 2.5

## 2022-02-24 MED ORDER — SENNOSIDES-DOCUSATE SODIUM 8.6-50 MG PO TABS: 1.0000 | ORAL_TABLET | Freq: Every evening | ORAL | Status: DC | PRN

## 2022-02-24 MED ORDER — OXYCODONE HCL 5 MG PO TABS
5.0000 mg | ORAL_TABLET | ORAL | Status: DC | PRN
  Filled 2022-02-24: qty 1

## 2022-02-24 MED ORDER — HEPARIN (PORCINE) 25000 UT/250ML-% IV SOLN
650.0000 [IU]/h | INTRAVENOUS | Status: AC
  Administered 2022-02-24 – 2022-02-27 (×3): 650 [IU]/h via INTRAVENOUS
  Filled 2022-02-24 (×3): qty 250

## 2022-02-24 MED ORDER — ALBUTEROL SULFATE (2.5 MG/3ML) 0.083% IN NEBU: 2.5000 mg | INHALATION_SOLUTION | RESPIRATORY_TRACT | Status: DC | PRN

## 2022-02-24 MED ORDER — PROCHLORPERAZINE EDISYLATE 10 MG/2ML IJ SOLN
2.5000 mg | INTRAMUSCULAR | Status: DC | PRN
  Administered 2022-02-26: 2.5 mg via INTRAVENOUS
  Filled 2022-02-24: qty 2

## 2022-02-24 MED ORDER — DRONABINOL 2.5 MG PO CAPS: 5.0000 mg | ORAL_CAPSULE | Freq: Two times a day (BID) | ORAL | Status: DC

## 2022-02-24 MED ORDER — ACETAMINOPHEN 10 MG/ML IV SOLN
1000.0000 mg | Freq: Once | INTRAVENOUS | Status: AC
  Administered 2022-02-24: 1000 mg via INTRAVENOUS
  Filled 2022-02-24: qty 100

## 2022-02-24 MED ORDER — ACETAMINOPHEN 325 MG PO TABS
650.0000 mg | ORAL_TABLET | Freq: Four times a day (QID) | ORAL | Status: DC | PRN
  Administered 2022-02-24 (×2): 650 mg via ORAL
  Filled 2022-02-24 (×2): qty 2

## 2022-02-24 MED ORDER — LACTATED RINGERS IV SOLN: INTRAVENOUS | Status: AC

## 2022-02-24 NOTE — Progress Notes (Signed)
ANTICOAGULATION CONSULT NOTE - Initial Consult  Pharmacy Consult for Heparin Indication: atrial fibrillation  Allergies  Allergen Reactions   Codeine Other (See Comments)    Passed out   Colchicine Diarrhea    Patient Measurements:    Vital Signs: BP: 125/62 (02/22 1537) Pulse Rate: 78 (02/22 1537)  Labs: Recent Labs    02/23/22 1153 02/24/22 0540 02/24/22 0617 02/24/22 1950  HGB 11.6* 10.8*  --   --   HCT 36.1 33.0*  --   --   PLT 317.0 277  --   --   APTT  --   --  48* 81*  LABPROT  --   --  13.7  --   INR  --   --  1.1  --   HEPARINUNFRC  --   --  >1.10*  --   CREATININE 0.99 1.13*  --   --      Estimated Creatinine Clearance: 26.9 mL/min (A) (by C-G formula based on SCr of 1.13 mg/dL (H)).   Medical History: Past Medical History:  Diagnosis Date   Anemia    Aortic atherosclerosis (HCC)    Arthritis    Osteoporosis, neck stiffness   Atrial fibrillation ([place])    Bleeds easily [facility])    "father was a hemophiliac" - pt not being bothered in recent years.   CAD (coronary artery disease)    Calcium deposit in bursa of knee 2017   Cancer ([place])    skin cancer "basal cell".   Chicken pox    Cyst, Baker's knee    left knee- tx. Cortisone injection 05-05-15 in office- "improved pain relief and swelling left ankle and knee"   Diverticulitis    Diverticulosis    Dysrhythmia    Dr. McAlhany-cardiology   GERD (gastroesophageal reflux disease)    Glaucoma of both eyes    Gout    Hearing loss of both ears    Bilateral ears- hearing aids used- right ear hearing betther than left.   Hiatal hernia    History of blood transfusion 1957   "lots; most related to my periods"-last 1968 -s/p Hysterectomy   Migraines    "years ago" (02/04/2013)   Motion sickness    back seat - cars, sudden movements   PAF (paroxysmal atrial fibrillation) (HCC)    Pancreatic cyst    Pelvic floor dysfunction    PONV (postoperative nausea and vomiting)    Risk for falls    "wobbley" per  daughter   Self-catheterizes urinary bladder    "daily- retained urine and chronic UTI"   SVT (supraventricular tachycardia)    Dr. Darnell Level. [place]-follows "loop recorder left chest"   Swallowing difficulty    freq occ. mostley liquids   Urine incontinence    UTI (urinary tract infection)    Chronic Urinary tract infections- tx Macrobid daily.   Vertigo    Wears hearing aid in both ears     Medications:  Medications Prior to Admission  Medication Sig Dispense Refill Last Dose   acetaminophen (TYLENOL) 500 MG tablet Take 500 mg by mouth 2 (two) times daily.   Past Week   bimatoprost (LUMIGAN) 0.01 % SOLN Place 1 drop into both eyes at bedtime.   Past Week   cefdinir (OMNICEF) 250 MG/5ML suspension Take 250 mg by mouth daily.   02/22/2022   dorzolamide-timolol (COSOPT) 22.3-6.8 MG/ML ophthalmic solution Place 1 drop into both eyes 2 (two) times daily.   Past Week   dronabinol (MARINOL) 5 MG capsule Take  5 mg by mouth 2 (two) times daily before a meal.   Past Week   ELIQUIS 2.5 MG TABS tablet TAKE 1 TABLET TWICE A DAY (Patient taking differently: Take 2.5 mg by mouth 2 (two) times daily.) 180 tablet 0 02/22/2022 at am   estradiol (ESTRACE) 0.1 MG/GM vaginal cream Place 1 Applicatorful vaginally every other day.   Past Week   gabapentin (NEURONTIN) 100 MG capsule Take 300 mg by mouth 2 (two) times daily.   Past Week   guaifenesin (ROBITUSSIN) 100 MG/5ML syrup Take 100 mg by mouth 3 (three) times daily.   Past Week   Melatonin 1 MG CHEW Chew 1 tablet by mouth as needed (sleep).   Past Month   omeprazole (PRILOSEC) 20 MG capsule TAKE 1 CAPSULE BY MOUTH EVERY DAY (Patient taking differently: Take 20 mg by mouth daily. TAKE 1 CAPSULE BY MOUTH EVERY DAY) 90 capsule 1 Past Week   ondansetron (ZOFRAN-ODT) 4 MG disintegrating tablet Take 1 tablet (4 mg total) by mouth every 8 (eight) hours as needed. (Patient taking differently: Take 4 mg by mouth every 8 (eight) hours as needed for nausea or vomiting.) 20  tablet 0 unknown   prochlorperazine (COMPAZINE) 10 MG tablet Take 10 mg by mouth every 4 (four) hours as needed for nausea or vomiting.   unknown   sodium chloride HYPERTONIC 3 % nebulizer solution Take by nebulization 2 (two) times daily as needed for other. 750 mL 12 unknown   Wheat Dextrin (BENEFIBER PO) Take 5 mLs by mouth 2 (two) times daily.   Past Week   Catheters MISC Replace catheter twice daily. 180 each 1    dronabinol (MARINOL) 2.5 MG capsule Take 1 capsule (2.5 mg total) by mouth 2 (two) times daily before lunch and supper. (Patient not taking: Reported on 02/24/2022) 180 capsule 1 Not Taking    Assessment: 89 y.o. female admitted with diverticulitis, h/o Afib and Eliquis on hold, for heparin  PTT came back therapeutic this PM at 56. We will cont with the current rate and check a confirm in AM.   Goal of Therapy:  aPTT 66-102 sec Heparin level 0.3-0.7 units/ml Monitor platelets by anticoagulation protocol: Yes   Plan:  Cont heparin 650 units/hr Heparin level and PTT daily  Onnie Boer, PharmD, [place], AAHIVP, CPP Infectious Disease Pharmacist 02/24/2022 8:48 PM

## 2022-02-24 NOTE — Progress Notes (Signed)
ANTICOAGULATION CONSULT NOTE - Initial Consult  Pharmacy Consult for Heparin Indication: atrial fibrillation  Allergies  Allergen Reactions   Codeine Other (See Comments)    Passed out   Colchicine Diarrhea    Patient Measurements:    Vital Signs: Temp: 97.8 F (36.6 C) (02/22 0500) Temp Source: Oral (02/22 0500) BP: 141/61 (02/22 0500) Pulse Rate: 84 (02/22 0500)  Labs: Recent Labs    02/23/22 1153 02/24/22 0540  HGB 11.6* 10.8*  HCT 36.1 33.0*  PLT 317.0 277  CREATININE 0.99  --     Estimated Creatinine Clearance: 30.7 mL/min (by C-G formula based on SCr of 0.99 mg/dL).   Medical History: Past Medical History:  Diagnosis Date   Anemia    Aortic atherosclerosis (HCC)    Arthritis    Osteoporosis, neck stiffness   Atrial fibrillation ([place])    Bleeds easily [facility])    "father was a hemophiliac" - pt not being bothered in recent years.   CAD (coronary artery disease)    Calcium deposit in bursa of knee 2017   Cancer ([place])    skin cancer "basal cell".   Chicken pox    Cyst, Baker's knee    left knee- tx. Cortisone injection 05-05-15 in office- "improved pain relief and swelling left ankle and knee"   Diverticulitis    Diverticulosis    Dysrhythmia    Dr. McAlhany-cardiology   GERD (gastroesophageal reflux disease)    Glaucoma of both eyes    Gout    Hearing loss of both ears    Bilateral ears- hearing aids used- right ear hearing betther than left.   Hiatal hernia    History of blood transfusion 1957   "lots; most related to my periods"-last 1968 -s/p Hysterectomy   Migraines    "years ago" (02/04/2013)   Motion sickness    back seat - cars, sudden movements   PAF (paroxysmal atrial fibrillation) (HCC)    Pancreatic cyst    Pelvic floor dysfunction    PONV (postoperative nausea and vomiting)    Risk for falls    "wobbley" per daughter   Self-catheterizes urinary bladder    "daily- retained urine and chronic UTI"   SVT (supraventricular tachycardia)     Dr. Darnell Level. [place]-follows "loop recorder left chest"   Swallowing difficulty    freq occ. mostley liquids   Urine incontinence    UTI (urinary tract infection)    Chronic Urinary tract infections- tx Macrobid daily.   Vertigo    Wears hearing aid in both ears     Medications:  Medications Prior to Admission  Medication Sig Dispense Refill Last Dose   acetaminophen (TYLENOL) 500 MG tablet Take 500 mg by mouth 2 (two) times daily.      bimatoprost (LUMIGAN) 0.01 % SOLN Place 1 drop into both eyes at bedtime.      Catheters MISC Replace catheter twice daily. 180 each 1    cefdinir (OMNICEF) 250 MG/5ML suspension Take 250 mg by mouth daily.      dorzolamide-timolol (COSOPT) 22.3-6.8 MG/ML ophthalmic solution Place 1 drop into both eyes 2 (two) times daily.      dronabinol (MARINOL) 2.5 MG capsule Take 1 capsule (2.5 mg total) by mouth 2 (two) times daily before lunch and supper. 180 capsule 1    ELIQUIS 2.5 MG TABS tablet TAKE 1 TABLET TWICE A DAY 180 tablet 0    estradiol (ESTRACE) 0.1 MG/GM vaginal cream Place 1 Applicatorful vaginally at bedtime.  gabapentin (NEURONTIN) 100 MG capsule Take 300 mg by mouth 2 (two) times daily.      guaifenesin (ROBITUSSIN) 100 MG/5ML syrup Take 10 mLs by mouth 3 (three) times daily as needed for cough.      Melatonin 1 MG CHEW Chew 1 tablet by mouth as needed.      omeprazole (PRILOSEC) 20 MG capsule TAKE 1 CAPSULE BY MOUTH EVERY DAY 90 capsule 1    ondansetron (ZOFRAN-ODT) 4 MG disintegrating tablet Take 1 tablet (4 mg total) by mouth every 8 (eight) hours as needed. 20 tablet 0    prochlorperazine (COMPAZINE) 10 MG tablet Take 10 mg by mouth every 4 (four) hours as needed.      sodium chloride HYPERTONIC 3 % nebulizer solution Take by nebulization 2 (two) times daily as needed for other. 750 mL 12    Wheat Dextrin (BENEFIBER PO) Take 1 mg by mouth daily.       Assessment: 89 y.o. female admitted with diverticulitis, h/o Afib and Eliquis on hold,  for heparin Goal of Therapy:  aPTT 66-102 sec Heparin level 0.3-0.7 units/ml Monitor platelets by anticoagulation protocol: Yes   Plan:  Start heparin 650 units/hr Check aPTT in 8 hours   Kemya Shed, [place] 02/24/2022,6:33 AM

## 2022-02-24 NOTE — H&P (Addendum)
PCP:   Janith Lima, MD   Chief Complaint:  Abdominal pain  HPI: This is a 89 year old female with past medical history of paroxysmal atrial fibrillation on chronic anticoagulation, bronchitic stasis, pseudogout, diverticulosis, CAD, she self-catheterizes,.  For the past 3 to 4 years him she has had fever and abdominal pain, worse in the left lower quadrant.  She endorses nausea, vomiting, diarrhea. It is unclear if she has blood in her stool as she had a vaginal surgery with mesh that eroded through the top of of her vagina.  As the patient is maintained on Eliquis, sometimes her secretions is bloody.  Due to this vaginal disfigurement, patient self caths. Today the patient saw her PCP because of the abdominal pain, she was sent for CAT scan which showed diverticulitis with microperforations.  She was sent to the ER.  History of admit patient's daughter who is present at bedside.  Review of Systems:  The patient denies vision loss, decreased hearing, hoarseness, chest pain, syncope, dyspnea on exertion, peripheral edema, balance deficits, hemoptysis, melena, hematochezia,  genital sores, muscle weakness, suspicious skin lesions, transient blindness,  unusual weight change,  enlarged lymph nodes, angioedema, and breast masses. Active: Fever, anorexia, abdominal pain, nausea, vomiting, diarrhea, indigestion, urinary retention, toe walking, depression abnormal bleeding  Past Medical History: Past Medical History:  Diagnosis Date   Anemia    Aortic atherosclerosis (HCC)    Arthritis    Osteoporosis, neck stiffness   Atrial fibrillation ([place])    Bleeds easily [facility])    "father was a hemophiliac" - pt not being bothered in recent years.   CAD (coronary artery disease)    Calcium deposit in bursa of knee 2017   Cancer ([place])    skin cancer "basal cell".   Chicken pox    Cyst, Baker's knee    left knee- tx. Cortisone injection 05-05-15 in office- "improved pain relief and swelling left ankle  and knee"   Diverticulitis    Diverticulosis    Dysrhythmia    Dr. McAlhany-cardiology   GERD (gastroesophageal reflux disease)    Glaucoma of both eyes    Gout    Hearing loss of both ears    Bilateral ears- hearing aids used- right ear hearing betther than left.   Hiatal hernia    History of blood transfusion 1957   "lots; most related to my periods"-last 1968 -s/p Hysterectomy   Migraines    "years ago" (02/04/2013)   Motion sickness    back seat - cars, sudden movements   PAF (paroxysmal atrial fibrillation) (HCC)    Pancreatic cyst    Pelvic floor dysfunction    PONV (postoperative nausea and vomiting)    Risk for falls    "wobbley" per daughter   Self-catheterizes urinary bladder    "daily- retained urine and chronic UTI"   SVT (supraventricular tachycardia)    Dr. Darnell Level. Taylor-follows "loop recorder left chest"   Swallowing difficulty    freq occ. mostley liquids   Urine incontinence    UTI (urinary tract infection)    Chronic Urinary tract infections- tx Macrobid daily.   Vertigo    Wears hearing aid in both ears    Past Surgical History:  Procedure Laterality Date   APPENDECTOMY  01/03/1946   BLADDER SURGERY  01/03/1993   Repair-    BREAST BIOPSY Bilateral    "both were fine"   CATARACT EXTRACTION W/ INTRAOCULAR LENS IMPLANT Left    CATARACT EXTRACTION W/PHACO Right 10/16/2018   Procedure:  CATARACT EXTRACTION PHACO AND INTRAOCULAR LENS PLACEMENT ([place])  RIGHT MiLoop diabetes 01:05.8  21.0%  13.77;  Surgeon: Birder Robson, MD;  Location: [facility];  Service: Ophthalmology;  Laterality: Right;  BS tends to drop in AM and would like early surgery so she can get home to eat, please   CHOLECYSTECTOMY  01/04/2012   COMBINED HYSTERECTOMY ABDOMINAL W/ A&P REPAIR / OOPHORECTOMY     DILATION AND CURETTAGE OF UTERUS     EP IMPLANTABLE DEVICE N/A 06/19/2014   Procedure: Loop Recorder Insertion;  Surgeon: Evans Lance, MD;  Location: [place] [facility];   Service: Cardiovascular;  Laterality: N/A;   EXCISION OF BREAST BIOPSY Right 05/13/2015   Procedure: RIGHT BREAST EXCISIONAL BIOPSY x2;  Surgeon: Armandina Gemma, MD;  Location: [facility];  Service: General;  Laterality: Right;   PACEMAKER IMPLANT  02/2020   TONSILLECTOMY  01/04/1944   TOTAL ABDOMINAL HYSTERECTOMY     VAGINAL HYSTERECTOMY  01/03/1966   vaginal mesh      Medications: Prior to Admission medications   Medication Sig Start Date End Date Taking? Authorizing Provider  acetaminophen (TYLENOL) 500 MG tablet Take 500 mg by mouth 2 (two) times daily.    [provider]  bimatoprost (LUMIGAN) 0.01 % SOLN Place 1 drop into both eyes at bedtime.    [provider]  Catheters MISC Replace catheter twice daily. 07/12/17   Janith Lima, MD  cefdinir (OMNICEF) 250 MG/5ML suspension Take 250 mg by mouth daily. 03/13/21   [provider]  dorzolamide-timolol (COSOPT) 22.3-6.8 MG/ML ophthalmic solution Place 1 drop into both eyes 2 (two) times daily.    [provider]  dronabinol (MARINOL) 2.5 MG capsule Take 1 capsule (2.5 mg total) by mouth 2 (two) times daily before lunch and supper. 10/06/21   Janith Lima, MD  ELIQUIS 2.5 MG TABS tablet TAKE 1 TABLET TWICE A DAY 04/23/19   Evans Lance, MD  estradiol (ESTRACE) 0.1 MG/GM vaginal cream Place 1 Applicatorful vaginally at bedtime.    [provider]  gabapentin (NEURONTIN) 100 MG capsule Take 300 mg by mouth 2 (two) times daily.    [provider]  guaifenesin (ROBITUSSIN) 100 MG/5ML syrup Take 10 mLs by mouth 3 (three) times daily as needed for cough.    [provider]  Melatonin 1 MG CHEW Chew 1 tablet by mouth as needed.    [provider]  omeprazole (PRILOSEC) 20 MG capsule TAKE 1 CAPSULE BY MOUTH EVERY DAY 10/26/21   Janith Lima, MD  ondansetron (ZOFRAN-ODT) 4 MG disintegrating tablet Take 1 tablet (4 mg total) by mouth every 8 (eight) hours as needed. 08/17/21    Evlyn Courier, PA-C  prochlorperazine (COMPAZINE) 10 MG tablet Take 10 mg by mouth every 4 (four) hours as needed. 01/19/22   [provider]  sodium chloride HYPERTONIC 3 % nebulizer solution Take by nebulization 2 (two) times daily as needed for other. 06/24/21   Martyn Ehrich, NP  Wheat Dextrin (BENEFIBER PO) Take 1 mg by mouth daily.    [provider]    Allergies:   Allergies  Allergen Reactions   Codeine Other (See Comments)    Passed out   Colchicine Diarrhea    Social History:  reports that she has never smoked. She has been exposed to tobacco smoke. She has never used smokeless tobacco. She reports that she does not drink alcohol and does not use drugs.  Family History: Family History  Problem Relation Age of Onset   Stroke Father    CVA Father    Ovarian cancer Mother    Cancer - Other Sister        Breast   Breast cancer Sister    Cancer - Other Brother        Throat   Cancer - Other Sister        Throat   Brain cancer Sister    Cancer - Other Other        Brain   Breast cancer Other     Physical Exam: Vitals:   02/23/22 2054 02/23/22 2239 02/23/22 2345  BP: (!) 142/72 (!) 152/76 108/62  Pulse: 91 (!) 103 93  Resp: 18 20 (!) 32  Temp: 98.8 F (37.1 C) 98.2 F (36.8 C)   TempSrc: Oral    SpO2: 98% 98% 100%    General:  Alert and oriented times three, very weak elderly appearing female Eyes: PERRLA, pink conjunctiva, no scleral icterus ENT: Moist oral mucosa, neck supple, no thyromegaly.  Flushed cheeks Lungs: clear to ascultation, no wheeze, no crackles, no use of accessory muscles Cardiovascular: regular rate and rhythm, no regurgitation, no gallops, no murmurs. No carotid bruits, no JVD Abdomen: soft, positive BS, positive TTP greatest in the LLQ, positive rebound, non-distended, no organomegaly, not an acute abdomen GU: not examined Neuro: CN II - XII grossly intact, sensation intact Musculoskeletal: strength 5/5 all  extremities, no clubbing, cyanosis or edema Skin: no rash, no subcutaneous crepitation, no decubitus Psych: appropriate patient  Labs on Admission:  Recent Labs    02/23/22 1153  NA 137  K 5.0  CL 102  CO2 26  GLUCOSE 112*  BUN 40*  CREATININE 0.99  CALCIUM 9.6   Recent Labs    02/23/22 1153  AST 18  ALT 14  ALKPHOS 93  BILITOT 0.5  PROT 7.7  ALBUMIN 3.7   Recent Labs    02/23/22 1153  LIPASE 37.0   Recent Labs    02/23/22 1153  WBC 14.2*  NEUTROABS 11.2*  HGB 11.6*  HCT 36.1  MCV 91.6  PLT 317.0     Radiological Exams on Admission: DG Chest Portable 1 View  Result Date: 02/24/2022 CLINICAL DATA:  Cough and abdominal pain EXAM: PORTABLE CHEST 1 VIEW COMPARISON:  10/15/2021 FINDINGS: Cardiac shadow is stable. Lead less pacemaker is again noted and stable. Chronic infiltrate is seen in the right base stable from the prior study. No new focal abnormality is seen. No bony changes are noted. IMPRESSION: Chronic somewhat nodular infiltrate in the right base stable from multiple previous exams. Electronically Signed   By: Inez Catalina M.D.   On: 02/24/2022 01:41   CT Abdomen Pelvis W Contrast  Result Date: 02/23/2022 CLINICAL DATA:  Acute abdominal pain nonlocalized lower abdomen. Diarrhea. Weakness EXAM: CT ABDOMEN AND PELVIS WITH CONTRAST TECHNIQUE: Multidetector CT imaging of the abdomen and pelvis was performed using the standard protocol following bolus administration of intravenous contrast. RADIATION DOSE REDUCTION: This exam was performed according to the departmental dose-optimization program which includes automated exposure control, adjustment of the mA and/or kV according to patient size and/or use of iterative reconstruction technique. CONTRAST:  62m OMNIPAQUE IOHEXOL 300 MG/ML  SOLN COMPARISON:  CT 08/07/2021 and older FINDINGS: Lower chest: As seen on the prior examination there areas of nodularity with opacity along the right lower lobe. Please correlate  with prior workup. This would have a differential including aggressive process. This is similar to  the recent chest CT of 12/15/2021. Stable leadless pacemaker again noted along the anterior margin of the right ventricle. Hepatobiliary: Previous cholecystectomy. Minimal ectasia of the biliary tree. No space-occupying liver lesion. Pancreas: Small cysts again seen along the midbody of the pancreas inferiorly, unchanged from previous measuring up to 12 mm. These appear slightly smaller today. Spleen: Splenic granulomas. Adrenals/Urinary Tract: Adrenal glands are preserved. Mild bilateral renal atrophy with stable Bosniak 1 bilateral small renal cysts. No specific imaging follow-up. The ureters have normal course and caliber extending down to the bladder. Stomach/Bowel: Colonic diverticulosis identified. There is wall thickening and stranding along the descending colon consistent with an area of diverticulitis. On sagittal image 90 of series 6 there are some tiny bolus of air immediately adjacent to the wall of the bowel in this location with confluence stranding. This could represent a microperforation. This area of ill-defined fluid on axial image 56 of series 2 measures 2.3 by 1.5 cm. More proximally the colon is nondilated. Small hiatal hernia. The small bowel is nondilated. Vascular/Lymphatic: Normal caliber aorta and IVC. Scattered vascular calcifications identified. There are areas of stenosis suggested along the origin of the right renal artery. Please correlate for any level of hypertension. Reproductive: Uterus itself is absent. No adnexal mass. As seen previously there is again elongation of the vaginal canal extending to the presacral region. Again seen on coronal and sagittal images. Sagittal image 61 of series 6. History of vaginal pexy. Again the etiology of the luminal gas and fluid within this structure is uncertain. There are some adjacent loops of small bowel and some mild stranding. Other: No  abdominal wall hernia or abnormality. No abdominopelvic ascites. Musculoskeletal: Degenerative changes seen of the spine and pelvis. IMPRESSION: Colonic diverticulosis with an area of inflammatory stranding and wall thickening along the mid descending colon consistent with an area of diverticulitis. Few bubbles of air immediately adjacent to the bowel wall posteriorly in this location could represent microperforation with early phlegmonous change. No widespread free air or obstruction. Recommend follow-up to confirm clearance. Stable changes of vaginal pexy with gas and fluid in the vagina extending up to the presacral region with some stranding. Please correlate with any prior workup. Stable cystic lesions along the midbody of the pancreas. In light of the patient's age simple follow up in 2 years could be performed. Masslike opacities in the right lower lobe of the lung as on prior CT of the chest from December 2023. Electronically Signed   By: Jill Side M.D.   On: 02/23/2022 15:19    Assessment/Plan Present on Admission:  Acute diverticulitis -N.p.o., IV fluid hydration -Surgeon Dr. Zenia Resides contacted by EDP -Blood cultures x 2 collected -Continue Zosyn, pharmacy to dose -Roxicodone PRN mild pain, morphine severe pain    A-fib ([place]) -Eliquis on hold.  Antibiotics will likely take care of infection however, will hold Eliquis and initiate heparin drip  -Metoprolol 12.5 mg as needed for atrial fibrillation  Urinary dysfunction -Self caths as needed -Patient maintained of cefdinir prophylactic for UTI.  This will be held as patient is on Zosyn   Protein calorie malnutrition ([place]) -Continue Marinol  Hip pain -Per daughter patient had a bad right hip cramp over the weekend.  She continues to be sore and this affects ambulation.  Patient has not fallen nor experience any trauma.  They have been treating it with Tylenol. -PT consult placed   Bronchiectstasis, underlying MAI -Continue Mucinex,  nebulizers in the evening, pulmonary toilet with flutter polyps  after nebs twice daily and hypertonic nebulizers in the a.m.   Pseudogout involving multiple joints  Kiwan [place] 02/24/2022, 1:56 AM

## 2022-02-24 NOTE — Evaluation (Signed)
Physical Therapy Evaluation Patient Details Name: Julie Silva MRN: [id number] DOB: 04-23-1927 Today's Date: 02/24/2022  History of Present Illness  89 yo female presents to [facility] from PCP on 2/21 with diverticulitis with possible microperforation. PMH includes PAF, bronchitic stasis, psuedogout, diverticulosis, CAD, self-catheterizes due to previous vaginal surgery, OP, OA, cholecystectomy, appendectomy.  Clinical Impression   Pt presents with generalized weakness, impaired balance requiring use of AD, abdominal and L hip pain, and decreased activity tolerance. Pt to benefit from acute PT to address deficits. Pt ambulated short room distance with close guard for safety, pt limited in tolerance by fatigue, pain, and MD arriving to room to see pt. PT anticipates pt is close to baseline, recommend continued family and caregiver support at home for mobility. PT to progress mobility as tolerated, and will continue to follow acutely.         Recommendations for follow up therapy are one component of a multi-disciplinary discharge planning process, led by the attending physician.  Recommendations may be updated based on patient status, additional functional criteria and insurance authorization.  Follow Up Recommendations No PT follow up      Assistance Recommended at Discharge PRN  Patient can return home with the following  A little help with walking and/or transfers;A little help with bathing/dressing/bathroom    Equipment Recommendations None recommended by PT  Recommendations for Other Services       Functional Status Assessment Patient has had a recent decline in their functional status and demonstrates the ability to make significant improvements in function in a reasonable and predictable amount of time.     Precautions / Restrictions Precautions Precautions: Fall Restrictions Weight Bearing Restrictions: No      Mobility  Bed Mobility Overal bed mobility: Needs Assistance Bed  Mobility: Supine to Sit     Supine to sit: HOB elevated, Min assist     General bed mobility comments: assist for trunk elevation, provided by daughter    Transfers Overall transfer level: Needs assistance Equipment used: Rolling walker (2 wheels) Transfers: Sit to/from Stand Sit to Stand: Min guard           General transfer comment: close guard for safety, STS x2 from EOB and toilet.    Ambulation/Gait Ambulation/Gait assistance: Min guard Gait Distance (Feet): 20 Feet Assistive device: Rolling walker (2 wheels) Gait Pattern/deviations: Step-through pattern, Decreased stride length, Trunk flexed, Antalgic Gait velocity: decr     General Gait Details: antalgic gait secondary to L hip, limited by tolerance and MD arriving to room so session cut short  Stairs            Wheelchair Mobility    Modified Rankin (Stroke Patients Only)       Balance Overall balance assessment: Needs assistance Sitting-balance support: No upper extremity supported, Feet supported Sitting balance-Leahy Scale: Fair     Standing balance support: Bilateral upper extremity supported, During functional activity, Reliant on assistive device for balance Standing balance-Leahy Scale: Poor                               Pertinent Vitals/Pain Pain Assessment Pain Assessment: Faces Faces Pain Scale: Hurts even more Pain Location: abdomen, L hip Pain Descriptors / Indicators: Sore, Discomfort Pain Intervention(s): Monitored during session, Repositioned, Limited activity within patient's tolerance    Home Living Family/patient expects to be discharged to:: Private residence Living Arrangements: Children;Other relatives Available Help at Discharge: Family;Personal care attendant;Available 24  hours/day (pt's 3 children, caregiver/sitter other times) Type of Home: House Home Access: Stairs to enter   [employer] of Steps: 1 threshold   Home Layout: One  level Home Equipment: Conservation officer, nature (2 wheels);Wheelchair - manual      Prior Function Prior Level of Function : Needs assist             Mobility Comments: uses RW in the house, uses w/c for appointments ADLs Comments: supervision for bathing, previously required much more assist from family and caregiver, hospice was involved but recently signed off     Hand Dominance   Dominant Hand: Right    Extremity/Trunk Assessment   Upper Extremity Assessment Upper Extremity Assessment: Defer to OT evaluation    Lower Extremity Assessment Lower Extremity Assessment: Generalized weakness    Cervical / Trunk Assessment Cervical / Trunk Assessment: Kyphotic  Communication   Communication: No difficulties  Cognition Arousal/Alertness: Awake/alert Behavior During Therapy: WFL for tasks assessed/performed Overall Cognitive Status: Within Functional Limits for tasks assessed                                          General Comments General comments (skin integrity, edema, etc.): nonbloody BM during session, MD aware    Exercises     Assessment/Plan    PT Assessment Patient needs continued PT services  PT Problem List Decreased strength;Decreased mobility;Decreased activity tolerance;Decreased balance;Decreased knowledge of use of DME;Pain;Decreased safety awareness       PT Treatment Interventions DME instruction;Therapeutic activities;Gait training;Therapeutic exercise;Patient/family education;Balance training;Stair training;Functional mobility training;Neuromuscular re-education    PT Goals (Current goals can be found in the Care Plan section)  Acute Rehab PT Goals Patient Stated Goal: home PT Goal Formulation: With patient/family Time For Goal Achievement: 03/10/22 Potential to Achieve Goals: Good    Frequency Min 3X/week     Co-evaluation               AM-PAC PT "6 Clicks" Mobility  Outcome Measure Help needed turning from your back to  your side while in a flat bed without using bedrails?: A Little Help needed moving from lying on your back to sitting on the side of a flat bed without using bedrails?: A Little Help needed moving to and from a bed to a chair (including a wheelchair)?: A Little Help needed standing up from a chair using your arms (e.g., wheelchair or bedside chair)?: A Little Help needed to walk in hospital room?: A Little Help needed climbing 3-5 steps with a railing? : A Little 6 Click Score: 18    End of Session   Activity Tolerance: Patient tolerated treatment well Patient left: in bed;with call bell/phone within reach;with family/visitor present;Other (comment) (MD in room) Nurse Communication: Mobility status PT Visit Diagnosis: Other abnormalities of gait and mobility (R26.89);Muscle weakness (generalized) (M62.81)    Time: [id number] PT Time Calculation (min) (ACUTE ONLY): 11 min   Charges:   PT Evaluation $PT Eval Low Complexity: 1 Low         Sakshi Sermons S, PT DPT Acute Rehabilitation Services Pager [phone]  Office [phone]   [place] E Ruffin Pyo 02/24/2022, 10:20 AM

## 2022-02-24 NOTE — Progress Notes (Signed)
Pharmacy Antibiotic Note  Julie Silva is a 89 y.o. female admitted on 02/23/2022 with  intra-abdominal infection .  Pharmacy has been consulted for Zosyn dosing. WBC 14.2. Renal function age appropriate.   Plan: Zosyn 3.375G IV q8h to be infused over 4 hours  Temp (24hrs), Avg:98.3 F (36.8 C), Min:98 F (36.7 C), Max:98.8 F (37.1 C)  Recent Labs  Lab 02/23/22 1153 02/23/22 1745  WBC 14.2*  --   CREATININE 0.99  --   LATICACIDVEN  --  1.4    Estimated Creatinine Clearance: 30.7 mL/min (by C-G formula based on SCr of 0.99 mg/dL).    Allergies  Allergen Reactions   Codeine Other (See Comments)    Passed out   Colchicine Diarrhea    Narda Bonds, PharmD, BCPS Clinical Pharmacist Phone: [phone]

## 2022-02-24 NOTE — ED Provider Notes (Signed)
[facility] Provider Note   CSN: [id number] Arrival date & time: 02/23/22  1708     History  Chief Complaint  Patient presents with   Abdominal Pain    Julie Silva is a 89 y.o. female.  HPI   Patient with medical history including CAD, SVT, MAI presenting from primary care office due to concerns of diverticulitis.  Per daughter who is at bedside states that over the last 4 days patient having worsening abdominal pain making her left lower quadrant, she has been having associated soft stools, with fevers, she had a slight cough which is nonproductive, no associated nausea or vomiting.  Denies any bloody stools.  She states that patient was on 5 days of Augmentin as well as prednisone due to a flareup of atelectasis started on by her pulmonologist, she finished on the 15th, daughter states while the patient was on antibiotics she was having worsening diarrhea but after she completed the course of diarrhea resolved.  She states that is when her abdominal pain began and got worse.  Patient is currently a DNR.  Reviewed patient's chart was seen by her primary care doctor, CT imaging was obtained shows amatory stranding and wall thickening along the mid descending colon consistent with areas of diverticulitis, air bubbles noted concern for possible microperforation.  Patient had lab work obtained that time showed a white count of 14.2 hemoglobin 11.6, hepatic panel unremarkable lipase unremarkable, BUN 40 at baseline is as well as GFR which is 49-  Home Medications Prior to Admission medications   Medication Sig Start Date End Date Taking? Authorizing Provider  acetaminophen (TYLENOL) 500 MG tablet Take 500 mg by mouth 2 (two) times daily.    [provider]  bimatoprost (LUMIGAN) 0.01 % SOLN Place 1 drop into both eyes at bedtime.    [provider]  Catheters MISC Replace catheter twice daily. 07/12/17   Janith Lima, MD   cefdinir (OMNICEF) 250 MG/5ML suspension Take 250 mg by mouth daily. 03/13/21   [provider]  dorzolamide-timolol (COSOPT) 22.3-6.8 MG/ML ophthalmic solution Place 1 drop into both eyes 2 (two) times daily.    [provider]  dronabinol (MARINOL) 2.5 MG capsule Take 1 capsule (2.5 mg total) by mouth 2 (two) times daily before lunch and supper. 10/06/21   Janith Lima, MD  ELIQUIS 2.5 MG TABS tablet TAKE 1 TABLET TWICE A DAY 04/23/19   Evans Lance, MD  estradiol (ESTRACE) 0.1 MG/GM vaginal cream Place 1 Applicatorful vaginally at bedtime.    [provider]  gabapentin (NEURONTIN) 100 MG capsule Take 300 mg by mouth 2 (two) times daily.    [provider]  guaifenesin (ROBITUSSIN) 100 MG/5ML syrup Take 10 mLs by mouth 3 (three) times daily as needed for cough.    [provider]  Melatonin 1 MG CHEW Chew 1 tablet by mouth as needed.    [provider]  omeprazole (PRILOSEC) 20 MG capsule TAKE 1 CAPSULE BY MOUTH EVERY DAY 10/26/21   Janith Lima, MD  ondansetron (ZOFRAN-ODT) 4 MG disintegrating tablet Take 1 tablet (4 mg total) by mouth every 8 (eight) hours as needed. 08/17/21   Evlyn Courier, PA-C  prochlorperazine (COMPAZINE) 10 MG tablet Take 10 mg by mouth every 4 (four) hours as needed. 01/19/22   [provider]  sodium chloride HYPERTONIC 3 % nebulizer solution Take by nebulization 2 (two) times daily as needed for other. 06/24/21  Martyn Ehrich, NP  Wheat Dextrin (BENEFIBER PO) Take 1 mg by mouth daily.    [provider]      Allergies    Codeine and Colchicine    Review of Systems   Review of Systems  Physical Exam Updated Vital Signs BP 108/62   Pulse 93   Temp 98.2 F (36.8 C)   Resp (!) 32   SpO2 100%  Physical Exam Vitals and nursing note reviewed.  Constitutional:      General: She is not in acute distress.    Appearance: She is not ill-appearing.  HENT:     Head: Normocephalic and  atraumatic.     Nose: No congestion.  Eyes:     Conjunctiva/sclera: Conjunctivae normal.  Cardiovascular:     Rate and Rhythm: Normal rate and regular rhythm.     Pulses: Normal pulses.     Heart sounds: No murmur heard.    No friction rub. No gallop.  Pulmonary:     Effort: No respiratory distress.     Breath sounds: Rales present. No wheezing or rhonchi.     Comments: No respiratory distress nontachypneic nonhypoxic, she has right lower Rales present no rhonchi noted. Abdominal:     Palpations: Abdomen is soft.     Tenderness: There is abdominal tenderness. There is no right CVA tenderness or left CVA tenderness.     Comments: Abdomen nondistended, soft, noted tenderness in her left lower quadrant without guarding or tense or peritoneal sign.  Musculoskeletal:     Right lower leg: No edema.     Left lower leg: No edema.  Skin:    General: Skin is warm and dry.  Neurological:     Mental Status: She is alert.  Psychiatric:        Mood and Affect: Mood normal.     ED Results / Procedures / Treatments   Labs (all labs ordered are listed, but only abnormal results are displayed) Labs Reviewed  CULTURE, BLOOD (ROUTINE X 2)  CULTURE, BLOOD (ROUTINE X 2)  LACTIC ACID, PLASMA    EKG None  Radiology DG Chest Portable 1 View  Result Date: 02/24/2022 CLINICAL DATA:  Cough and abdominal pain EXAM: PORTABLE CHEST 1 VIEW COMPARISON:  10/15/2021 FINDINGS: Cardiac shadow is stable. Lead less pacemaker is again noted and stable. Chronic infiltrate is seen in the right base stable from the prior study. No new focal abnormality is seen. No bony changes are noted. IMPRESSION: Chronic somewhat nodular infiltrate in the right base stable from multiple previous exams. Electronically Signed   By: Inez Catalina M.D.   On: 02/24/2022 01:41   CT Abdomen Pelvis W Contrast  Result Date: 02/23/2022 CLINICAL DATA:  Acute abdominal pain nonlocalized lower abdomen. Diarrhea. Weakness EXAM: CT ABDOMEN  AND PELVIS WITH CONTRAST TECHNIQUE: Multidetector CT imaging of the abdomen and pelvis was performed using the standard protocol following bolus administration of intravenous contrast. RADIATION DOSE REDUCTION: This exam was performed according to the departmental dose-optimization program which includes automated exposure control, adjustment of the mA and/or kV according to patient size and/or use of iterative reconstruction technique. CONTRAST:  31m OMNIPAQUE IOHEXOL 300 MG/ML  SOLN COMPARISON:  CT 08/07/2021 and older FINDINGS: Lower chest: As seen on the prior examination there areas of nodularity with opacity along the right lower lobe. Please correlate with prior workup. This would have a differential including aggressive process. This is similar to the recent chest CT of 12/15/2021. Stable leadless pacemaker again noted  along the anterior margin of the right ventricle. Hepatobiliary: Previous cholecystectomy. Minimal ectasia of the biliary tree. No space-occupying liver lesion. Pancreas: Small cysts again seen along the midbody of the pancreas inferiorly, unchanged from previous measuring up to 12 mm. These appear slightly smaller today. Spleen: Splenic granulomas. Adrenals/Urinary Tract: Adrenal glands are preserved. Mild bilateral renal atrophy with stable Bosniak 1 bilateral small renal cysts. No specific imaging follow-up. The ureters have normal course and caliber extending down to the bladder. Stomach/Bowel: Colonic diverticulosis identified. There is wall thickening and stranding along the descending colon consistent with an area of diverticulitis. On sagittal image 90 of series 6 there are some tiny bolus of air immediately adjacent to the wall of the bowel in this location with confluence stranding. This could represent a microperforation. This area of ill-defined fluid on axial image 56 of series 2 measures 2.3 by 1.5 cm. More proximally the colon is nondilated. Small hiatal hernia. The small bowel  is nondilated. Vascular/Lymphatic: Normal caliber aorta and IVC. Scattered vascular calcifications identified. There are areas of stenosis suggested along the origin of the right renal artery. Please correlate for any level of hypertension. Reproductive: Uterus itself is absent. No adnexal mass. As seen previously there is again elongation of the vaginal canal extending to the presacral region. Again seen on coronal and sagittal images. Sagittal image 61 of series 6. History of vaginal pexy. Again the etiology of the luminal gas and fluid within this structure is uncertain. There are some adjacent loops of small bowel and some mild stranding. Other: No abdominal wall hernia or abnormality. No abdominopelvic ascites. Musculoskeletal: Degenerative changes seen of the spine and pelvis. IMPRESSION: Colonic diverticulosis with an area of inflammatory stranding and wall thickening along the mid descending colon consistent with an area of diverticulitis. Few bubbles of air immediately adjacent to the bowel wall posteriorly in this location could represent microperforation with early phlegmonous change. No widespread free air or obstruction. Recommend follow-up to confirm clearance. Stable changes of vaginal pexy with gas and fluid in the vagina extending up to the presacral region with some stranding. Please correlate with any prior workup. Stable cystic lesions along the midbody of the pancreas. In light of the patient's age simple follow up in 2 years could be performed. Masslike opacities in the right lower lobe of the lung as on prior CT of the chest from December 2023. Electronically Signed   By: Jill Side M.D.   On: 02/23/2022 15:19    Procedures Procedures    Medications Ordered in ED Medications  acetaminophen (OFIRMEV) IV 1,000 mg (1,000 mg Intravenous New Bag/Given 02/24/22 0156)  piperacillin-tazobactam (ZOSYN) IVPB 3.375 g (0 g Intravenous Stopped 02/24/22 0152)    ED Course/ Medical Decision  Making/ A&P                             Medical Decision Making Amount and/or Complexity of Data Reviewed Radiology: ordered.  Risk Prescription drug management. Decision regarding hospitalization.   This patient presents to the ED for concern of abdominal pain, this involves an extensive number of treatment options, and is a complaint that carries with it a high risk of complications and morbidity.  The differential diagnosis includes diverticulitis, bowel obstruction, volvulus    Additional history obtained:  Additional history obtained from daughter at bedside External records from outside source obtained and reviewed including primary care note   Co morbidities that complicate the patient evaluation  MAI  Social Determinants of Health:  Geriatric    Lab Tests:  I Ordered, and personally interpreted labs.  The pertinent results include: Lactic 1.4 blood cultures pending   Imaging Studies ordered:  I ordered imaging studies including chest x-ray I independently visualized and interpreted imaging which showed negative acute findings I agree with the radiologist interpretation   Cardiac Monitoring:  The patient was maintained on a cardiac monitor.  I personally viewed and interpreted the cardiac monitored which showed an underlying rhythm of: N/A   Medicines ordered and prescription drug management:  I ordered medication including antibiotics, pain medication I have reviewed the patients home medicines and have made adjustments as needed  Critical Interventions:  N/A   Reevaluation:  Presents with abdominal pain, reviewed patient's chart concern for bowel perforation, will consult with general surgery for further evaluation  Update patient is recommendation agreed this plan will consult hospitalist team for admission  Consultations Obtained:  I requested consultation with the Dr. Zenia Resides general surgery,  and discussed lab and imaging findings as well  as pertinent plan - they recommend: Hospital admission, will consult patient the morning Dr. Juliet Rude will admit the patient     Test Considered:  N/a    Rule out I have low suspicion for liver or gallbladder abnormality as she has no right upper quadrant tenderness, liver enzymes, alk phos, T bili all within normal limits.  Low suspicion for pancreatitis as lipase is within normal limits.  Low suspicion for ruptured stomach ulcer as she has no peritoneal sign present on exam.  Suspicion for volvulus, bowel obstruction, kidney stone, pyelonephritis low at this time CT imaging is needed for these findings.    Dispostion and problem list  After consideration of the diagnostic results and the patients response to treatment, I feel that the patent would benefit from admission.  Diverticulitis-started on antibiotics, patient will need further observation from consultation by general surgery.            Final Clinical Impression(s) / ED Diagnoses Final diagnoses:  Diverticulitis of large intestine with perforation without bleeding    Rx / DC Orders ED Discharge Orders     None         Marcello Fennel, PA-C 02/24/22 0202    Fatima Blank, MD 02/24/22 [phone]

## 2022-02-24 NOTE — Consult Note (Signed)
Julie Silva April 21, 1927  [id number].    Requesting MD: Dr. Addison Lank Chief Complaint/Reason for Consult: diverticulitis  HPI:  Julie Silva is a 89 yo female who presented to the ED with several days of worsening abdominal pain. The pain is primarily in the lower abdomen. She has a lot of chronic GI issues including diarrhea, and her diarrhea has worsened recently. She has also had nausea, vomiting and low grade fevers. Unclear if she has had blood in her stools, as she has chronic mesh erosion into the vaginal cuff and often has bloody vaginal secretions. WBC is normal. A CT scan showed diverticulitis of the sigmoid colon with a contained microperforation. General surgery was consulted.  Prior abdominal surgeries including a cholecystectomy, appendectomy, hysterectomy, oophorectomy and vaginal mesh placement. It has been many years since her last colonoscopy.  ROS: Review of Systems  Constitutional:  Positive for fever and malaise/fatigue.  Gastrointestinal:  Positive for abdominal pain, diarrhea, nausea and vomiting.    Family History  Problem Relation Age of Onset   Stroke Father    CVA Father    Ovarian cancer Mother    Cancer - Other Sister        Breast   Breast cancer Sister    Cancer - Other Brother        Throat   Cancer - Other Sister        Throat   Brain cancer Sister    Cancer - Other Other        Brain   Breast cancer Other     Past Medical History:  Diagnosis Date   Anemia    Aortic atherosclerosis ([place])    Arthritis    Osteoporosis, neck stiffness   Atrial fibrillation ([place])    Bleeds easily [facility])    "father was a hemophiliac" - pt not being bothered in recent years.   CAD (coronary artery disease)    Calcium deposit in bursa of knee 2017   Cancer ([place])    skin cancer "basal cell".   Chicken pox    Cyst, Baker's knee    left knee- tx. Cortisone injection 05-05-15 in office- "improved pain relief and swelling left ankle and knee"   Diverticulitis     Diverticulosis    Dysrhythmia    Dr. McAlhany-cardiology   GERD (gastroesophageal reflux disease)    Glaucoma of both eyes    Gout    Hearing loss of both ears    Bilateral ears- hearing aids used- right ear hearing betther than left.   Hiatal hernia    History of blood transfusion 1957   "lots; most related to my periods"-last 1968 -s/p Hysterectomy   Migraines    "years ago" (02/04/2013)   Motion sickness    back seat - cars, sudden movements   PAF (paroxysmal atrial fibrillation) ([facility])    Pancreatic cyst    Pelvic floor dysfunction    PONV (postoperative nausea and vomiting)    Risk for falls    "wobbley" per daughter   Self-catheterizes urinary bladder    "daily- retained urine and chronic UTI"   SVT (supraventricular tachycardia)    Dr. Darnell Level. [place]-follows "loop recorder left chest"   Swallowing difficulty    freq occ. mostley liquids   Urine incontinence    UTI (urinary tract infection)    Chronic Urinary tract infections- tx Macrobid daily.   Vertigo    Wears hearing aid in both ears     Past Surgical History:  Procedure Laterality Date   APPENDECTOMY  01/03/1946   BLADDER SURGERY  01/03/1993   Repair-    BREAST BIOPSY Bilateral    "both were fine"   CATARACT EXTRACTION W/ INTRAOCULAR LENS IMPLANT Left    CATARACT EXTRACTION W/PHACO Right 10/16/2018   Procedure: CATARACT EXTRACTION PHACO AND INTRAOCULAR LENS PLACEMENT ([place])  RIGHT MiLoop diabetes 01:05.8  21.0%  13.77;  Surgeon: Birder Robson, MD;  Location: [facility];  Service: Ophthalmology;  Laterality: Right;  BS tends to drop in AM and would like early surgery so she can get home to eat, please   CHOLECYSTECTOMY  01/04/2012   COMBINED HYSTERECTOMY ABDOMINAL W/ A&P REPAIR / OOPHORECTOMY     DILATION AND CURETTAGE OF UTERUS     EP IMPLANTABLE DEVICE N/A 06/19/2014   Procedure: Loop Recorder Insertion;  Surgeon: Evans Lance, MD;  Location: [facility];  Service: Cardiovascular;   Laterality: N/A;   EXCISION OF BREAST BIOPSY Right 05/13/2015   Procedure: RIGHT BREAST EXCISIONAL BIOPSY x2;  Surgeon: Armandina Gemma, MD;  Location: [facility];  Service: General;  Laterality: Right;   PACEMAKER IMPLANT  02/2020   TONSILLECTOMY  01/04/1944   TOTAL ABDOMINAL HYSTERECTOMY     VAGINAL HYSTERECTOMY  01/03/1966   vaginal mesh      Social History:  reports that she has never smoked. She has been exposed to tobacco smoke. She has never used smokeless tobacco. She reports that she does not drink alcohol and does not use drugs.  Allergies:  Allergies  Allergen Reactions   Codeine Other (See Comments)    Passed out   Colchicine Diarrhea    Medications Prior to Admission  Medication Sig Dispense Refill   acetaminophen (TYLENOL) 500 MG tablet Take 500 mg by mouth 2 (two) times daily.     bimatoprost (LUMIGAN) 0.01 % SOLN Place 1 drop into both eyes at bedtime.     Catheters MISC Replace catheter twice daily. 180 each 1   cefdinir (OMNICEF) 250 MG/5ML suspension Take 250 mg by mouth daily.     dorzolamide-timolol (COSOPT) 22.3-6.8 MG/ML ophthalmic solution Place 1 drop into both eyes 2 (two) times daily.     dronabinol (MARINOL) 2.5 MG capsule Take 1 capsule (2.5 mg total) by mouth 2 (two) times daily before lunch and supper. 180 capsule 1   ELIQUIS 2.5 MG TABS tablet TAKE 1 TABLET TWICE A DAY 180 tablet 0   estradiol (ESTRACE) 0.1 MG/GM vaginal cream Place 1 Applicatorful vaginally at bedtime.     gabapentin (NEURONTIN) 100 MG capsule Take 300 mg by mouth 2 (two) times daily.     guaifenesin (ROBITUSSIN) 100 MG/5ML syrup Take 10 mLs by mouth 3 (three) times daily as needed for cough.     Melatonin 1 MG CHEW Chew 1 tablet by mouth as needed.     omeprazole (PRILOSEC) 20 MG capsule TAKE 1 CAPSULE BY MOUTH EVERY DAY 90 capsule 1   ondansetron (ZOFRAN-ODT) 4 MG disintegrating tablet Take 1 tablet (4 mg total) by mouth every 8 (eight) hours as needed. 20 tablet 0   prochlorperazine  (COMPAZINE) 10 MG tablet Take 10 mg by mouth every 4 (four) hours as needed.     sodium chloride HYPERTONIC 3 % nebulizer solution Take by nebulization 2 (two) times daily as needed for other. 750 mL 12   Wheat Dextrin (BENEFIBER PO) Take 1 mg by mouth daily.       Physical Exam: Blood pressure (!) 141/61, pulse 84, temperature  97.8 F (36.6 C), temperature source Oral, resp. rate 19, SpO2 97 %. General: resting comfortably, appears stated age, no apparent distress Neurological: alert and oriented, no focal deficits HEENT: normocephalic, atraumatic CV: extremities warm and well-perfused Respiratory: normal work of breathing on room air Abdomen: soft, nondistended, focally tender to palpation in the LLQ. Extremities: warm and well-perfused, no deformities, moving all extremities spontaneously Psychiatric: normal mood and affect Skin: warm and dry, no jaundice, no rashes or lesions   Results for orders placed or performed during the hospital encounter of 02/23/22 (from the past 48 hour(s))  Lactic acid, plasma     Status: None   Collection Time: 02/23/22  5:45 PM  Result Value Ref Range   Lactic Acid, Venous 1.4 0.5 - 1.9 mmol/L    Comment: Performed at [facility], [address], [place],  [address]  CBC     Status: Abnormal   Collection Time: 02/24/22  5:40 AM  Result Value Ref Range   WBC 10.5 4.0 - 10.5 K/uL   RBC 3.64 (L) 3.87 - 5.11 MIL/uL   Hemoglobin 10.8 (L) 12.0 - 15.0 g/dL   HCT 33.0 (L) 36.0 - 46.0 %   MCV 90.7 80.0 - 100.0 fL   MCH 29.7 26.0 - 34.0 pg   MCHC 32.7 30.0 - 36.0 g/dL   RDW 13.3 11.5 - 15.5 %   Platelets 277 150 - 400 K/uL   nRBC 0.0 0.0 - 0.2 %    Comment: Performed at [facility], [place] [address], [place], [place] [address]  Comprehensive metabolic panel     Status: Abnormal   Collection Time: 02/24/22  5:40 AM  Result Value Ref Range   Sodium 135 135 - 145 mmol/L   Potassium 3.6 3.5 - 5.1 mmol/L   Chloride 102 98 - 111 mmol/L    CO2 19 (L) 22 - 32 mmol/L   Glucose, Bld 100 (H) 70 - 99 mg/dL    Comment: Glucose reference range applies only to samples taken after fasting for at least 8 hours.   BUN 30 (H) 8 - 23 mg/dL   Creatinine, Ser 1.13 (H) 0.44 - 1.00 mg/dL   Calcium 8.6 (L) 8.9 - 10.3 mg/dL   Total Protein 6.9 6.5 - 8.1 g/dL   Albumin 2.7 (L) 3.5 - 5.0 g/dL   AST 19 15 - 41 U/L   ALT 16 0 - 44 U/L   Alkaline Phosphatase 95 38 - 126 U/L   Total Bilirubin 0.9 0.3 - 1.2 mg/dL   GFR, Estimated 46 (L) >60 mL/min    Comment: (NOTE) Calculated using the CKD-EPI Creatinine Equation (2021)    Anion gap 14 5 - 15    Comment: Performed at [facility], [place] [address] [place], [place] [address]  Magnesium     Status: None   Collection Time: 02/24/22  5:40 AM  Result Value Ref Range   Magnesium 2.1 1.7 - 2.4 mg/dL    Comment: Performed at [facility] [address] [place], [place] [address]   DG Chest Portable 1 View  Result Date: 02/24/2022 CLINICAL DATA:  Cough and abdominal pain EXAM: PORTABLE CHEST 1 VIEW COMPARISON:  10/15/2021 FINDINGS: Cardiac shadow is stable. Lead less pacemaker is again noted and stable. Chronic infiltrate is seen in the right base stable from the prior study. No new focal abnormality is seen. No bony changes are noted. IMPRESSION: Chronic somewhat nodular infiltrate in the right base  stable from multiple previous exams. Electronically Signed   By: Inez Catalina M.D.   On: 02/24/2022 01:41   CT Abdomen Pelvis W Contrast  Result Date: 02/23/2022 CLINICAL DATA:  Acute abdominal pain nonlocalized lower abdomen. Diarrhea. Weakness EXAM: CT ABDOMEN AND PELVIS WITH CONTRAST TECHNIQUE: Multidetector CT imaging of the abdomen and pelvis was performed using the standard protocol following bolus administration of intravenous contrast. RADIATION DOSE REDUCTION: This exam was performed according to the departmental dose-optimization program which includes automated exposure control,  adjustment of the mA and/or kV according to patient size and/or use of iterative reconstruction technique. CONTRAST:  2m OMNIPAQUE IOHEXOL 300 MG/ML  SOLN COMPARISON:  CT 08/07/2021 and older FINDINGS: Lower chest: As seen on the prior examination there areas of nodularity with opacity along the right lower lobe. Please correlate with prior workup. This would have a differential including aggressive process. This is similar to the recent chest CT of 12/15/2021. Stable leadless pacemaker again noted along the anterior margin of the right ventricle. Hepatobiliary: Previous cholecystectomy. Minimal ectasia of the biliary tree. No space-occupying liver lesion. Pancreas: Small cysts again seen along the midbody of the pancreas inferiorly, unchanged from previous measuring up to 12 mm. These appear slightly smaller today. Spleen: Splenic granulomas. Adrenals/Urinary Tract: Adrenal glands are preserved. Mild bilateral renal atrophy with stable Bosniak 1 bilateral small renal cysts. No specific imaging follow-up. The ureters have normal course and caliber extending down to the bladder. Stomach/Bowel: Colonic diverticulosis identified. There is wall thickening and stranding along the descending colon consistent with an area of diverticulitis. On sagittal image 90 of series 6 there are some tiny bolus of air immediately adjacent to the wall of the bowel in this location with confluence stranding. This could represent a microperforation. This area of ill-defined fluid on axial image 56 of series 2 measures 2.3 by 1.5 cm. More proximally the colon is nondilated. Small hiatal hernia. The small bowel is nondilated. Vascular/Lymphatic: Normal caliber aorta and IVC. Scattered vascular calcifications identified. There are areas of stenosis suggested along the origin of the right renal artery. Please correlate for any level of hypertension. Reproductive: Uterus itself is absent. No adnexal mass. As seen previously there is again  elongation of the vaginal canal extending to the presacral region. Again seen on coronal and sagittal images. Sagittal image 61 of series 6. History of vaginal pexy. Again the etiology of the luminal gas and fluid within this structure is uncertain. There are some adjacent loops of small bowel and some mild stranding. Other: No abdominal wall hernia or abnormality. No abdominopelvic ascites. Musculoskeletal: Degenerative changes seen of the spine and pelvis. IMPRESSION: Colonic diverticulosis with an area of inflammatory stranding and wall thickening along the mid descending colon consistent with an area of diverticulitis. Few bubbles of air immediately adjacent to the bowel wall posteriorly in this location could represent microperforation with early phlegmonous change. No widespread free air or obstruction. Recommend follow-up to confirm clearance. Stable changes of vaginal pexy with gas and fluid in the vagina extending up to the presacral region with some stranding. Please correlate with any prior workup. Stable cystic lesions along the midbody of the pancreas. In light of the patient's age simple follow up in 2 years could be performed. Masslike opacities in the right lower lobe of the lung as on prior CT of the chest from December 2023. Electronically Signed   By: AJill SideM.D.   On: 02/23/2022 15:19      Assessment/Plan 89yo  female presenting with acute LLQ abdominal pain and imaging evidence of diverticulitis with a microperforation.  I personally reviewed her labs, imaging and notes. - Continue IV antibiotics - Bowel rest, ok for sips of clear liquids today - Serial exams - Surgery will follow. Anticipate patient will improve with medical management. No indication for acute surgical intervention.   Michaelle Birks, MD [facility] Surgery General, Hepatobiliary and Pancreatic Surgery 02/24/22 6:42 AM

## 2022-02-24 NOTE — Care Management Obs Status (Cosign Needed)
[facility] NOTIFICATION   Patient Details  Name: Julie Silva MRN: [id number] Date of Birth: [birth year]   Medicare Observation Status Notification Given:  Yes    Curlene Labrum, RN 02/24/2022, 3:04 PM

## 2022-02-24 NOTE — ED Notes (Signed)
ED TO INPATIENT HANDOFF REPORT  ED Nurse Name and Phone #: [phone] Julie Pope, RN   S Name/Age/Gender Julie Silva 89 y.o. female Room/Bed: 027C/027C  Code Status   Code Status: Full Code  Home/SNF/Other Admission Patient oriented to: self, place, time, and situation Is this baseline? Yes   Triage Complete: Triage complete  Chief Complaint Acute diverticulitis [K57.92]  Triage Note Patient referred to ED after a CT scan showed diverticulitis with a possible  microperforation. Imaging was ordered by PCP to evaluate abdominal pain and diarrhea that started approximately four days ago. Patient alert and in no apparent distress at this time.   Allergies Allergies  Allergen Reactions   Codeine Other (See Comments)    Passed out   Colchicine Diarrhea    Level of Care/Admitting Diagnosis ED Disposition     ED Disposition  Admit   Condition  --   [place]: [facility] [[id number]]  Level of Care: Med-Surg [16]  May place patient in observation at [facility] or [facility] if equivalent level of care is available:: Yes  Covid Evaluation: Confirmed COVID Negative  Diagnosis: Acute diverticulitis [id number]  Admitting Physician: [place], [place]  Attending Physician: Quintella Baton [[id number]]          B Medical/Surgery History Past Medical History:  Diagnosis Date   Anemia    Aortic atherosclerosis ([place])    Arthritis    Osteoporosis, neck stiffness   Atrial fibrillation ([place])    Bleeds easily [facility])    "father was a hemophiliac" - pt not being bothered in recent years.   CAD (coronary artery disease)    Calcium deposit in bursa of knee 2017   Cancer ([place])    skin cancer "basal cell".   Chicken pox    Cyst, Baker's knee    left knee- tx. Cortisone injection 05-05-15 in office- "improved pain relief and swelling left ankle and knee"   Diverticulitis    Diverticulosis    Dysrhythmia    Dr. McAlhany-cardiology   GERD  (gastroesophageal reflux disease)    Glaucoma of both eyes    Gout    Hearing loss of both ears    Bilateral ears- hearing aids used- right ear hearing betther than left.   Hiatal hernia    History of blood transfusion 1957   "lots; most related to my periods"-last 1968 -s/p Hysterectomy   Migraines    "years ago" (02/04/2013)   Motion sickness    back seat - cars, sudden movements   PAF (paroxysmal atrial fibrillation) (HCC)    Pancreatic cyst    Pelvic floor dysfunction    PONV (postoperative nausea and vomiting)    Risk for falls    "wobbley" per daughter   Self-catheterizes urinary bladder    "daily- retained urine and chronic UTI"   SVT (supraventricular tachycardia)    Dr. Darnell Level. [place]-follows "loop recorder left chest"   Swallowing difficulty    freq occ. mostley liquids   Urine incontinence    UTI (urinary tract infection)    Chronic Urinary tract infections- tx Macrobid daily.   Vertigo    Wears hearing aid in both ears    Past Surgical History:  Procedure Laterality Date   APPENDECTOMY  01/03/1946   BLADDER SURGERY  01/03/1993   Repair-    BREAST BIOPSY Bilateral    "both were fine"   CATARACT EXTRACTION W/ INTRAOCULAR LENS IMPLANT Left    CATARACT EXTRACTION W/PHACO Right 10/16/2018  Procedure: CATARACT EXTRACTION PHACO AND INTRAOCULAR LENS PLACEMENT ([place])  RIGHT MiLoop diabetes 01:05.8  21.0%  13.77;  Surgeon: Birder Robson, MD;  Location: [facility];  Service: Ophthalmology;  Laterality: Right;  BS tends to drop in AM and would like early surgery so she can get home to eat, please   CHOLECYSTECTOMY  01/04/2012   COMBINED HYSTERECTOMY ABDOMINAL W/ A&P REPAIR / OOPHORECTOMY     DILATION AND CURETTAGE OF UTERUS     EP IMPLANTABLE DEVICE N/A 06/19/2014   Procedure: Loop Recorder Insertion;  Surgeon: Evans Lance, MD;  Location: [place] [facility];  Service: Cardiovascular;  Laterality: N/A;   EXCISION OF BREAST BIOPSY Right 05/13/2015   Procedure:  RIGHT BREAST EXCISIONAL BIOPSY x2;  Surgeon: Armandina Gemma, MD;  Location: [facility];  Service: General;  Laterality: Right;   PACEMAKER IMPLANT  02/2020   TONSILLECTOMY  01/04/1944   TOTAL ABDOMINAL HYSTERECTOMY     VAGINAL HYSTERECTOMY  01/03/1966   vaginal mesh       A IV Location/Drains/Wounds Patient Lines/Drains/Airways Status     Active Line/Drains/Airways     Name Placement date Placement time Site Days   Peripheral IV 02/24/22 20 G 1" Anterior;Right Forearm 02/24/22  0120  Forearm  less than 1   Incision (Closed) 05/13/15 Breast Right 05/13/15  0902  -- 2479   Incision (Closed) 10/16/18 Eye 10/16/18  0817  -- 1227            Intake/Output Last 24 hours No intake or output data in the 24 hours ending 02/24/22 0358  Labs/Imaging Results for orders placed or performed during the hospital encounter of 02/23/22 (from the past 48 hour(s))  Lactic acid, plasma     Status: None   Collection Time: 02/23/22  5:45 PM  Result Value Ref Range   Lactic Acid, Venous 1.4 0.5 - 1.9 mmol/L    Comment: Performed at [facility], [place] [address], [place], [place] [address]   DG Chest Portable 1 View  Result Date: 02/24/2022 CLINICAL DATA:  Cough and abdominal pain EXAM: PORTABLE CHEST 1 VIEW COMPARISON:  10/15/2021 FINDINGS: Cardiac shadow is stable. Lead less pacemaker is again noted and stable. Chronic infiltrate is seen in the right base stable from the prior study. No new focal abnormality is seen. No bony changes are noted. IMPRESSION: Chronic somewhat nodular infiltrate in the right base stable from multiple previous exams. Electronically Signed   By: Inez Catalina M.D.   On: 02/24/2022 01:41   CT Abdomen Pelvis W Contrast  Result Date: 02/23/2022 CLINICAL DATA:  Acute abdominal pain nonlocalized lower abdomen. Diarrhea. Weakness EXAM: CT ABDOMEN AND PELVIS WITH CONTRAST TECHNIQUE: Multidetector CT imaging of the abdomen and pelvis was performed using the standard protocol  following bolus administration of intravenous contrast. RADIATION DOSE REDUCTION: This exam was performed according to the departmental dose-optimization program which includes automated exposure control, adjustment of the mA and/or kV according to patient size and/or use of iterative reconstruction technique. CONTRAST:  82m OMNIPAQUE IOHEXOL 300 MG/ML  SOLN COMPARISON:  CT 08/07/2021 and older FINDINGS: Lower chest: As seen on the prior examination there areas of nodularity with opacity along the right lower lobe. Please correlate with prior workup. This would have a differential including aggressive process. This is similar to the recent chest CT of 12/15/2021. Stable leadless pacemaker again noted along the anterior margin of the right ventricle. Hepatobiliary: Previous cholecystectomy. Minimal ectasia of the biliary tree. No space-occupying liver lesion.  Pancreas: Small cysts again seen along the midbody of the pancreas inferiorly, unchanged from previous measuring up to 12 mm. These appear slightly smaller today. Spleen: Splenic granulomas. Adrenals/Urinary Tract: Adrenal glands are preserved. Mild bilateral renal atrophy with stable Bosniak 1 bilateral small renal cysts. No specific imaging follow-up. The ureters have normal course and caliber extending down to the bladder. Stomach/Bowel: Colonic diverticulosis identified. There is wall thickening and stranding along the descending colon consistent with an area of diverticulitis. On sagittal image 90 of series 6 there are some tiny bolus of air immediately adjacent to the wall of the bowel in this location with confluence stranding. This could represent a microperforation. This area of ill-defined fluid on axial image 56 of series 2 measures 2.3 by 1.5 cm. More proximally the colon is nondilated. Small hiatal hernia. The small bowel is nondilated. Vascular/Lymphatic: Normal caliber aorta and IVC. Scattered vascular calcifications identified. There are areas  of stenosis suggested along the origin of the right renal artery. Please correlate for any level of hypertension. Reproductive: Uterus itself is absent. No adnexal mass. As seen previously there is again elongation of the vaginal canal extending to the presacral region. Again seen on coronal and sagittal images. Sagittal image 61 of series 6. History of vaginal pexy. Again the etiology of the luminal gas and fluid within this structure is uncertain. There are some adjacent loops of small bowel and some mild stranding. Other: No abdominal wall hernia or abnormality. No abdominopelvic ascites. Musculoskeletal: Degenerative changes seen of the spine and pelvis. IMPRESSION: Colonic diverticulosis with an area of inflammatory stranding and wall thickening along the mid descending colon consistent with an area of diverticulitis. Few bubbles of air immediately adjacent to the bowel wall posteriorly in this location could represent microperforation with early phlegmonous change. No widespread free air or obstruction. Recommend follow-up to confirm clearance. Stable changes of vaginal pexy with gas and fluid in the vagina extending up to the presacral region with some stranding. Please correlate with any prior workup. Stable cystic lesions along the midbody of the pancreas. In light of the patient's age simple follow up in 2 years could be performed. Masslike opacities in the right lower lobe of the lung as on prior CT of the chest from December 2023. Electronically Signed   By: Jill Side M.D.   On: 02/23/2022 15:19    Pending Labs Unresulted Labs (From admission, onward)     Start     Ordered   02/24/22 0500  Comprehensive metabolic panel  Tomorrow morning,   R        02/24/22 0354   02/24/22 0500  Magnesium  Tomorrow morning,   R        02/24/22 0354   02/24/22 0353  CBC  (heparin)  Once,   R       Comments: Baseline for heparin therapy IF NOT ALREADY DRAWN.  Notify MD if PLT < 100 K.    02/24/22 0354    02/23/22 1736  Blood culture (routine x 2)  BLOOD CULTURE X 2,   R (with STAT occurrences)      02/23/22 1735            Vitals/Pain Today's Vitals   02/24/22 0225 02/24/22 0315 02/24/22 0330 02/24/22 0346  BP:    (!) 120/57  Pulse:  89 88 86  Resp:  (!) 22 (!) 23 20  Temp:    98 F (36.7 C)  TempSrc:      SpO2:  95% 96% 96%  PainSc: Asleep       Isolation Precautions No active isolations  Medications Medications  dronabinol (MARINOL) capsule 5 mg (has no administration in time range)  gabapentin (NEURONTIN) capsule 300 mg (has no administration in time range)  latanoprost (XALATAN) 0.005 % ophthalmic solution 1 drop (has no administration in time range)  dorzolamide-timolol (COSOPT) 2-0.5 % ophthalmic solution 1 drop (has no administration in time range)  hydrALAZINE (APRESOLINE) injection 5 mg (has no administration in time range)  lactated ringers infusion (has no administration in time range)  guaiFENesin (ROBITUSSIN) 100 MG/5ML liquid 5 mL (has no administration in time range)  ipratropium-albuterol (DUONEB) 0.5-2.5 (3) MG/3ML nebulizer solution 3 mL (has no administration in time range)  albuterol (PROVENTIL) (2.5 MG/3ML) 0.083% nebulizer solution 2.5 mg (has no administration in time range)  acetaminophen (TYLENOL) tablet 650 mg (has no administration in time range)    Or  acetaminophen (TYLENOL) suppository 650 mg (has no administration in time range)  senna-docusate (Senokot-S) tablet 1 tablet (has no administration in time range)  ondansetron (ZOFRAN) tablet 4 mg (has no administration in time range)    Or  ondansetron (ZOFRAN) injection 4 mg (has no administration in time range)  acetaminophen (OFIRMEV) IV 1,000 mg (0 mg Intravenous Stopped 02/24/22 0350)  piperacillin-tazobactam (ZOSYN) IVPB 3.375 g (0 g Intravenous Stopped 02/24/22 0152)    Mobility manual wheelchair     Focused Assessments Gastrointestinal complications.    R Recommendations: See  Admitting Provider Note  Report given to:   Additional Notes: Very, very pleasant 44F coming from family house as children provide majority of care. Was hospice care for FTT, but has since put on ~40lbs and told no longer an appropriate candidate. Had an outpatient CT abdominal series completed for ongoing pain, abdominal rigidity, and abnormal bowel movements to r/o SBO. Resulted with diverticulitis and microperforation. Alert, oriented and presents at her baseline. Has chronic left hip pain. Has received first Zosyn dose, and 1 run of IV tylenol. Dressed down and resting comfortably. Vitals all normal and stable.

## 2022-02-24 NOTE — Progress Notes (Addendum)
PROGRESS NOTE  Julie Silva [id number] DOB: 04/29/1927   PCP: Janith Lima, MD  Patient is from: Home  DOA: 02/23/2022 LOS: 0  Chief complaints Chief Complaint  Patient presents with   Abdominal Pain     Brief Narrative / Interim history: 89 year old F with PMH of paroxysmal A-fib on Eliquis, SSS with PPM, bronchiectasis/MAI, diverticulosis, CAD, pseudogout, recurrent UTI and urinary retention in the setting of pelvic floor weakness/dysfunction with self-catheterization presented to PCP with LLQ pain, nausea, vomiting and diarrhea.  She had leukocytosis.  CT abdomen and pelvis concerning for diverticulitis with microperforation, and she was directed to the hospital for further care.  Started on IV Zosyn.  General surgery consulted. Patient also has cramping right hip pain.  No history of fall or trauma.    Subjective: Seen and examined earlier this morning.  No major events overnight of this morning.  Patient's daughter at bedside.  Reports improvement in abdominal pain.  Pain is mainly in LLQ but radiates to RLQ.  No nausea or vomiting.  Had a bowel movement.  No melena or hematochezia.  Continues to endorse left hip pain that she describes as cramping.   Objective: Vitals:   02/24/22 0330 02/24/22 0346 02/24/22 0500 02/24/22 0916  BP:  (!) 120/57 (!) 141/61   Pulse: 88 86 84   Resp: (!) 23 20 19   $ Temp:  98 F (36.7 C) 97.8 F (36.6 C)   TempSrc:   Oral   SpO2: 96% 96% 97% 96%    Examination:  GENERAL: No apparent distress.  Nontoxic. HEENT: MMM.  Vision grossly intact.  Hard of hearing. NECK: Supple.  No apparent JVD.  RESP:  No IWOB.  Fair aeration bilaterally. CVS:  RRR. Heart sounds normal.  ABD/GI/GU: BS+. Abd soft.  TTP in LLQ>> RLQ.  No rebound.  No guarding. MSK/EXT:  Moves extremities. No apparent deformity.  Pain with active left hip flexion.  TTP over left trochanteric bursa. SKIN: no apparent skin lesion or wound NEURO: Awake, alert and oriented  appropriately.  No apparent focal neuro deficit. PSYCH: Calm. Normal affect.   Procedures:  None  Microbiology summarized: Blood cultures NGTD  Assessment and plan: Principal Problem:   Acute diverticulitis Active Problems:   Hyperlipidemia with target LDL less than 130   Dysphagia   Second degree AV block   Pseudogout involving multiple joints   Bronchiectasis (HCC)   Paroxysmal atrial fibrillation (HCC)   Chronic respiratory failure with hypoxia (HCC)   Chronic kidney disease, stage 3a ([place])  Acute diverticulitis with microperforation: Symptoms include N/V/D/abdominal pain.  TTP across lower abdomen more on LLQ.  No rebound or guarding.  CT abdomen and pelvis as above.  Seems like first diverticulitis episode in the last 12 months. -General surgery following-appreciate recs mentations-sips of clears, serial exams, IV antibiotics and pain control   Paroxysmal A-fib: Rate controlled.  On Eliquis for anticoagulation.  Not on rate or rhythm control at home. -Continue holding Eliquis -Continue IV heparin -Continue metoprolol as needed. -Optimize electrolytes   Pelvic floor dysfunction/urinary retention/recurrent UTI-patient does self-catheterization.  CT abdomen and pelvis with stable changes of vaginal pexy with gas and fluid in the vagina extending up to the presacral region with some stranding. -Self caths as needed -Hold home prophylactic cefdinir while on IV Zosyn.   Left hip pain: Exam suspicious for left trochanteric bursitis.  No fall or trauma. -Will obtain left hip x-ray to exclude fracture -Offered orthopedic surgery consult for steroid injection  but daughter wants to hold off this for now -Pain control with as needed Tylenol, oxycodone and IV morphine. -Continue gabapentin  CKD-3A: Creatinine slightly up. Recent Labs    06/25/21 1536 08/17/21 1219 02/23/22 1153 02/24/22 0540  BUN 40* 31* 40* 30*  CREATININE 0.94 0.78 0.99 1.13*  -Continue gentle IV  fluid. -Avoid or minimize sedating medications  Normocytic anemia: Likely chronic.  Stable. Recent Labs    03/30/21 1446 06/25/21 1536 08/17/21 1219 09/03/21 1504 02/23/22 1153 02/24/22 0540  HGB 11.4* 10.6* 11.9* 12.5 11.6* 10.8*  -Continue monitoring  SSS s/p PPM: Followed at [facility].  EKG with sinus rhythm.  Non-anion gap metabolic acidosis: Mild. -Continue monitoring.  Bronchiectasis, underlying MAI -Continue mucolytic's, nebs, pulmonary toilet and hypertonic saline   Pseudogout involving multiple joints  Addendum Possible RLL mass: Seems stable compared to CT chest in 12/2021. Stable cystic lesion of pancreas -Outpatient follow-up.     There is no height or weight on file to calculate BMI.           DVT prophylaxis:  On full dose anticoagulation for A-fib.  Code Status: Full code Family Communication: Updated patient's daughter at bedside Level of care: Med-Surg Status is: Observation The patient will require care spanning > 2 midnights and should be moved to inpatient because: Acute diverticulitis with microperforation   Final disposition: Likely home once medically stable Consultants:  General surgery  55 minutes with more than 50% spent in reviewing records, counseling patient/family and coordinating care.   Sch Meds:  Scheduled Meds:  dorzolamide-timolol  1 drop Both Eyes BID   dronabinol  5 mg Oral BID AC   gabapentin  300 mg Oral BID   latanoprost  1 drop Both Eyes QHS   sodium chloride HYPERTONIC  4 mL Nebulization Daily   Continuous Infusions:  heparin     lactated ringers 75 mL/hr at 02/24/22 0537   piperacillin-tazobactam (ZOSYN)  IV 3.375 g (02/24/22 0945)   PRN Meds:.acetaminophen **OR** acetaminophen, albuterol, guaiFENesin, hydrALAZINE, ipratropium-albuterol, morphine injection, oxyCODONE, prochlorperazine, senna-docusate  Antimicrobials: Anti-infectives (From admission, onward)    Start     Dose/Rate Route Frequency Ordered  Stop   02/24/22 1000  piperacillin-tazobactam (ZOSYN) IVPB 3.375 g        3.375 g 12.5 mL/hr over 240 Minutes Intravenous Every 8 hours 02/24/22 0457     02/24/22 0115  piperacillin-tazobactam (ZOSYN) IVPB 3.375 g        3.375 g 100 mL/hr over 30 Minutes Intravenous  Once 02/24/22 0102 02/24/22 0152        I have personally reviewed the following labs and images: CBC: Recent Labs  Lab 02/23/22 1153 02/24/22 0540  WBC 14.2* 10.5  NEUTROABS 11.2*  --   HGB 11.6* 10.8*  HCT 36.1 33.0*  MCV 91.6 90.7  PLT 317.0 277   BMP &GFR Recent Labs  Lab 02/23/22 1153 02/24/22 0540  NA 137 135  K 5.0 3.6  CL 102 102  CO2 26 19*  GLUCOSE 112* 100*  BUN 40* 30*  CREATININE 0.99 1.13*  CALCIUM 9.6 8.6*  MG  --  2.1   Estimated Creatinine Clearance: 26.9 mL/min (A) (by C-G formula based on SCr of 1.13 mg/dL (H)). Liver & Pancreas: Recent Labs  Lab 02/23/22 1153 02/24/22 0540  AST 18 19  ALT 14 16  ALKPHOS 93 95  BILITOT 0.5 0.9  PROT 7.7 6.9  ALBUMIN 3.7 2.7*   Recent Labs  Lab 02/23/22 1153  LIPASE 37.0  No results for input(s): "AMMONIA" in the last 168 hours. Diabetic: No results for input(s): "HGBA1C" in the last 72 hours. No results for input(s): "GLUCAP" in the last 168 hours. Cardiac Enzymes: No results for input(s): "CKTOTAL", "CKMB", "CKMBINDEX", "TROPONINI" in the last 168 hours. Recent Labs    03/30/21 1446 06/25/21 1536  PROBNP 162.0* 138.0*   Coagulation Profile: Recent Labs  Lab 02/24/22 0617  INR 1.1   Thyroid Function Tests: No results for input(s): "TSH", "T4TOTAL", "FREET4", "T3FREE", "THYROIDAB" in the last 72 hours. Lipid Profile: No results for input(s): "CHOL", "HDL", "LDLCALC", "TRIG", "CHOLHDL", "LDLDIRECT" in the last 72 hours. Anemia Panel: No results for input(s): "VITAMINB12", "FOLATE", "FERRITIN", "TIBC", "IRON", "RETICCTPCT" in the last 72 hours. Urine analysis:    Component Value Date/Time   COLORURINE YELLOW 08/17/2021  1315   APPEARANCEUR HAZY (A) 08/17/2021 1315   LABSPEC 1.015 08/17/2021 1315   PHURINE 8.0 08/17/2021 1315   GLUCOSEU NEGATIVE 08/17/2021 1315   GLUCOSEU NEGATIVE 07/12/2021 1700   HGBUR NEGATIVE 08/17/2021 1315   BILIRUBINUR NEGATIVE 08/17/2021 1315   BILIRUBINUR negative 02/26/2021 1132   KETONESUR NEGATIVE 08/17/2021 1315   PROTEINUR NEGATIVE 08/17/2021 1315   UROBILINOGEN 0.2 07/12/2021 1700   NITRITE NEGATIVE 08/17/2021 1315   LEUKOCYTESUR SMALL (A) 08/17/2021 1315   Sepsis Labs: Invalid input(s): "PROCALCITONIN", "LACTICIDVEN"  Microbiology: Recent Results (from the past 240 hour(s))  Blood culture (routine x 2)     Status: None (Preliminary result)   Collection Time: 02/23/22  5:36 PM   Specimen: BLOOD  Result Value Ref Range Status   Specimen Description BLOOD RIGHT ANTECUBITAL  Final   Special Requests   Final    BOTTLES DRAWN AEROBIC AND ANAEROBIC Blood Culture results may not be optimal due to an inadequate volume of blood received in culture bottles   Culture   Final    NO GROWTH < 12 HOURS Performed at [facility], [place] [address] [place], [place] [address]    Report Status PENDING  Incomplete    Radiology Studies: DG Chest Portable 1 View  Result Date: 02/24/2022 CLINICAL DATA:  Cough and abdominal pain EXAM: PORTABLE CHEST 1 VIEW COMPARISON:  10/15/2021 FINDINGS: Cardiac shadow is stable. Lead less pacemaker is again noted and stable. Chronic infiltrate is seen in the right base stable from the prior study. No new focal abnormality is seen. No bony changes are noted. IMPRESSION: Chronic somewhat nodular infiltrate in the right base stable from multiple previous exams. Electronically Signed   By: Inez Catalina M.D.   On: 02/24/2022 01:41   CT Abdomen Pelvis W Contrast  Result Date: 02/23/2022 CLINICAL DATA:  Acute abdominal pain nonlocalized lower abdomen. Diarrhea. Weakness EXAM: CT ABDOMEN AND PELVIS WITH CONTRAST TECHNIQUE: Multidetector CT imaging of  the abdomen and pelvis was performed using the standard protocol following bolus administration of intravenous contrast. RADIATION DOSE REDUCTION: This exam was performed according to the departmental dose-optimization program which includes automated exposure control, adjustment of the mA and/or kV according to patient size and/or use of iterative reconstruction technique. CONTRAST:  35m OMNIPAQUE IOHEXOL 300 MG/ML  SOLN COMPARISON:  CT 08/07/2021 and older FINDINGS: Lower chest: As seen on the prior examination there areas of nodularity with opacity along the right lower lobe. Please correlate with prior workup. This would have a differential including aggressive process. This is similar to the recent chest CT of 12/15/2021. Stable leadless pacemaker again noted along the anterior margin of the right ventricle. Hepatobiliary: Previous cholecystectomy. Minimal  ectasia of the biliary tree. No space-occupying liver lesion. Pancreas: Small cysts again seen along the midbody of the pancreas inferiorly, unchanged from previous measuring up to 12 mm. These appear slightly smaller today. Spleen: Splenic granulomas. Adrenals/Urinary Tract: Adrenal glands are preserved. Mild bilateral renal atrophy with stable Bosniak 1 bilateral small renal cysts. No specific imaging follow-up. The ureters have normal course and caliber extending down to the bladder. Stomach/Bowel: Colonic diverticulosis identified. There is wall thickening and stranding along the descending colon consistent with an area of diverticulitis. On sagittal image 90 of series 6 there are some tiny bolus of air immediately adjacent to the wall of the bowel in this location with confluence stranding. This could represent a microperforation. This area of ill-defined fluid on axial image 56 of series 2 measures 2.3 by 1.5 cm. More proximally the colon is nondilated. Small hiatal hernia. The small bowel is nondilated. Vascular/Lymphatic: Normal caliber aorta and  IVC. Scattered vascular calcifications identified. There are areas of stenosis suggested along the origin of the right renal artery. Please correlate for any level of hypertension. Reproductive: Uterus itself is absent. No adnexal mass. As seen previously there is again elongation of the vaginal canal extending to the presacral region. Again seen on coronal and sagittal images. Sagittal image 61 of series 6. History of vaginal pexy. Again the etiology of the luminal gas and fluid within this structure is uncertain. There are some adjacent loops of small bowel and some mild stranding. Other: No abdominal wall hernia or abnormality. No abdominopelvic ascites. Musculoskeletal: Degenerative changes seen of the spine and pelvis. IMPRESSION: Colonic diverticulosis with an area of inflammatory stranding and wall thickening along the mid descending colon consistent with an area of diverticulitis. Few bubbles of air immediately adjacent to the bowel wall posteriorly in this location could represent microperforation with early phlegmonous change. No widespread free air or obstruction. Recommend follow-up to confirm clearance. Stable changes of vaginal pexy with gas and fluid in the vagina extending up to the presacral region with some stranding. Please correlate with any prior workup. Stable cystic lesions along the midbody of the pancreas. In light of the patient's age simple follow up in 2 years could be performed. Masslike opacities in the right lower lobe of the lung as on prior CT of the chest from December 2023. Electronically Signed   By: Jill Side M.D.   On: 02/23/2022 15:19      Willetta York T. [place]  If 7PM-7AM, please contact night-coverage [email or web address] 02/24/2022, 11:10 AM

## 2022-02-25 DIAGNOSIS — K5792 Diverticulitis of intestine, part unspecified, without perforation or abscess without bleeding: Secondary | ICD-10-CM | POA: Diagnosis present

## 2022-02-25 DIAGNOSIS — H9193 Unspecified hearing loss, bilateral: Secondary | ICD-10-CM | POA: Diagnosis present

## 2022-02-25 DIAGNOSIS — Z66 Do not resuscitate: Secondary | ICD-10-CM | POA: Diagnosis present

## 2022-02-25 DIAGNOSIS — J479 Bronchiectasis, uncomplicated: Secondary | ICD-10-CM | POA: Diagnosis present

## 2022-02-25 DIAGNOSIS — N179 Acute kidney failure, unspecified: Secondary | ICD-10-CM | POA: Diagnosis present

## 2022-02-25 DIAGNOSIS — M810 Age-related osteoporosis without current pathological fracture: Secondary | ICD-10-CM | POA: Diagnosis present

## 2022-02-25 DIAGNOSIS — I10 Essential (primary) hypertension: Secondary | ICD-10-CM | POA: Diagnosis not present

## 2022-02-25 DIAGNOSIS — M9905 Segmental and somatic dysfunction of pelvic region: Secondary | ICD-10-CM | POA: Diagnosis not present

## 2022-02-25 DIAGNOSIS — D631 Anemia in chronic kidney disease: Secondary | ICD-10-CM | POA: Diagnosis present

## 2022-02-25 DIAGNOSIS — E43 Unspecified severe protein-calorie malnutrition: Secondary | ICD-10-CM | POA: Diagnosis present

## 2022-02-25 DIAGNOSIS — E872 Acidosis, unspecified: Secondary | ICD-10-CM | POA: Diagnosis present

## 2022-02-25 DIAGNOSIS — N1831 Chronic kidney disease, stage 3a: Secondary | ICD-10-CM | POA: Diagnosis present

## 2022-02-25 DIAGNOSIS — J9611 Chronic respiratory failure with hypoxia: Secondary | ICD-10-CM | POA: Diagnosis present

## 2022-02-25 DIAGNOSIS — I441 Atrioventricular block, second degree: Secondary | ICD-10-CM | POA: Diagnosis present

## 2022-02-25 DIAGNOSIS — K5720 Diverticulitis of large intestine with perforation and abscess without bleeding: Secondary | ICD-10-CM | POA: Diagnosis present

## 2022-02-25 DIAGNOSIS — N390 Urinary tract infection, site not specified: Secondary | ICD-10-CM | POA: Diagnosis present

## 2022-02-25 DIAGNOSIS — K862 Cyst of pancreas: Secondary | ICD-10-CM | POA: Diagnosis present

## 2022-02-25 DIAGNOSIS — E785 Hyperlipidemia, unspecified: Secondary | ICD-10-CM | POA: Diagnosis present

## 2022-02-25 DIAGNOSIS — M25551 Pain in right hip: Secondary | ICD-10-CM | POA: Diagnosis present

## 2022-02-25 DIAGNOSIS — M7062 Trochanteric bursitis, left hip: Secondary | ICD-10-CM | POA: Diagnosis present

## 2022-02-25 DIAGNOSIS — E876 Hypokalemia: Secondary | ICD-10-CM | POA: Diagnosis not present

## 2022-02-25 DIAGNOSIS — I480 Paroxysmal atrial fibrillation: Secondary | ICD-10-CM | POA: Diagnosis present

## 2022-02-25 DIAGNOSIS — I2510 Atherosclerotic heart disease of native coronary artery without angina pectoris: Secondary | ICD-10-CM | POA: Diagnosis present

## 2022-02-25 DIAGNOSIS — I495 Sick sinus syndrome: Secondary | ICD-10-CM | POA: Diagnosis present

## 2022-02-25 DIAGNOSIS — A310 Pulmonary mycobacterial infection: Secondary | ICD-10-CM | POA: Diagnosis present

## 2022-02-25 DIAGNOSIS — M1189 Other specified crystal arthropathies, multiple sites: Secondary | ICD-10-CM | POA: Diagnosis not present

## 2022-02-25 DIAGNOSIS — M1120 Other chondrocalcinosis, unspecified site: Secondary | ICD-10-CM | POA: Diagnosis present

## 2022-02-25 LAB — RENAL FUNCTION PANEL
Albumin: 2.5 g/dL — ABNORMAL LOW (ref 3.5–5.0)
Anion gap: 19 — ABNORMAL HIGH (ref 5–15)
BUN: 31 mg/dL — ABNORMAL HIGH (ref 8–23)
CO2: 15 mmol/L — ABNORMAL LOW (ref 22–32)
Calcium: 8.4 mg/dL — ABNORMAL LOW (ref 8.9–10.3)
Chloride: 107 mmol/L (ref 98–111)
Creatinine, Ser: 1.29 mg/dL — ABNORMAL HIGH (ref 0.44–1.00)
GFR, Estimated: 39 mL/min — ABNORMAL LOW (ref >60)
Glucose, Bld: 65 mg/dL — ABNORMAL LOW (ref 70–99)
Phosphorus: 4 mg/dL (ref 2.5–4.6)
Potassium: 3.9 mmol/L (ref 3.5–5.1)
Sodium: 141 mmol/L (ref 135–145)

## 2022-02-25 LAB — RETICULOCYTES
Immature Retic Fract: 14.4 % (ref 2.3–15.9)
RBC.: 3.56 MIL/uL — ABNORMAL LOW (ref 3.87–5.11)
Retic Count, Absolute: 38.4 10*3/uL (ref 19.0–186.0)
Retic Ct Pct: 1.1 % (ref 0.4–3.1)

## 2022-02-25 LAB — IRON AND TIBC
Iron: 53 ug/dL (ref 28–170)
Saturation Ratios: 18 % (ref 10.4–31.8)
TIBC: 293 ug/dL (ref 250–450)
UIBC: 240 ug/dL

## 2022-02-25 LAB — VITAMIN B12: Vitamin B-12: 1154 pg/mL — ABNORMAL HIGH (ref 180–914)

## 2022-02-25 LAB — MAGNESIUM: Magnesium: 2.3 mg/dL (ref 1.7–2.4)

## 2022-02-25 LAB — CK: Total CK: 45 U/L (ref 38–234)

## 2022-02-25 LAB — APTT: aPTT: 74 seconds — ABNORMAL HIGH (ref 24–36)

## 2022-02-25 LAB — FERRITIN: Ferritin: 161 ng/mL (ref 11–307)

## 2022-02-25 LAB — HEPARIN LEVEL (UNFRACTIONATED): Heparin Unfractionated: 0.69 IU/mL (ref 0.30–0.70)

## 2022-02-25 LAB — FOLATE: Folate: >40 ng/mL (ref >5.9)

## 2022-02-25 MED ORDER — SODIUM CHLORIDE 3 % IN NEBU
4.0000 mL | INHALATION_SOLUTION | Freq: Two times a day (BID) | RESPIRATORY_TRACT | Status: DC
  Administered 2022-02-25 – 2022-02-28 (×6): 4 mL via RESPIRATORY_TRACT
  Filled 2022-02-25 (×7): qty 4

## 2022-02-25 MED ORDER — SODIUM BICARBONATE 650 MG PO TABS
650.0000 mg | ORAL_TABLET | Freq: Three times a day (TID) | ORAL | Status: DC
  Administered 2022-02-25 – 2022-02-28 (×10): 650 mg via ORAL
  Filled 2022-02-25 (×10): qty 1

## 2022-02-25 MED ORDER — ACETAMINOPHEN 500 MG PO TABS
1000.0000 mg | ORAL_TABLET | Freq: Four times a day (QID) | ORAL | Status: DC | PRN
  Administered 2022-02-25 – 2022-02-26 (×5): 1000 mg via ORAL
  Filled 2022-02-25 (×5): qty 2

## 2022-02-25 NOTE — Progress Notes (Signed)
PROGRESS NOTE  Julie Silva [id number] DOB: 15-Dec-1927   PCP: Janith Lima, MD  Patient is from: Home  DOA: 02/23/2022 LOS: 0  Chief complaints Chief Complaint  Patient presents with   Abdominal Pain     Brief Narrative / Interim history: 89 year old F with PMH of paroxysmal A-fib on Eliquis, SSS with PPM, bronchiectasis/MAI, diverticulosis, CAD, pseudogout, recurrent UTI and urinary retention in the setting of pelvic floor weakness/dysfunction with self-catheterization presented to PCP with LLQ pain, nausea, vomiting and diarrhea.  She had leukocytosis.  CT abdomen and pelvis concerning for diverticulitis with microperforation, and she was directed to the hospital for further care.  Started on IV Zosyn.  General surgery consulted. Patient also has cramping right hip pain.  No history of fall or trauma.  X-ray without acute finding.  Patient seems to be improving from diverticulitis and right hip pain standpoint.  General surgery following.    Subjective: Seen and examined earlier this morning.  No major events overnight of this morning.  Reports an episode of watery diarrhea last night.  Denies blood in stool.  Right hip pain improved.  Denies nausea or vomiting.  Objective: Vitals:   02/24/22 1537 02/24/22 2050 02/25/22 0621 02/25/22 0828  BP: 125/62 130/68 138/64 (!) 146/78  Pulse: 78 77 (!) 102 (!) 108  Resp: '16 16 17   '$ Temp:  98 F (36.7 C) 97.6 F (36.4 C) 98.3 F (36.8 C)  TempSrc:  Oral Oral   SpO2: 97% 98% 98% 99%    Examination:  GENERAL: No apparent distress.  Nontoxic. HEENT: MMM.  Vision grossly intact.  Diminished hearing. NECK: Supple.  No apparent JVD.  RESP:  No IWOB.  Fair aeration bilaterally. CVS:  RRR. Heart sounds normal.  ABD/GI/GU: BS+. Abd soft.  Some TTP over LLQ.  No rebound or guarding. MSK/EXT:   No apparent deformity. Moves extremities. No edema.  SKIN: no apparent skin lesion or wound NEURO: Awake and alert. Oriented  appropriately.  No apparent focal neuro deficit. PSYCH: Calm. Normal affect.   Procedures:  None  Microbiology summarized: Blood cultures NGTD  Assessment and plan: Principal Problem:   Acute diverticulitis Active Problems:   Hyperlipidemia with target LDL less than 130   Dysphagia   Second degree AV block   Pseudogout involving multiple joints   Bronchiectasis (HCC)   Paroxysmal atrial fibrillation (HCC)   Chronic respiratory failure with hypoxia (HCC)   Chronic kidney disease, stage 3a ([place])  Acute diverticulitis with microperforation: Symptoms include N/V/D/abdominal pain.  TTP across lower abdomen more on LLQ.  No rebound or guarding.  CT abdomen and pelvis as above.  Seems like first diverticulitis episode in the last 12 months. -General surgery following-appreciate recs-CLD, Zosyn -Encouraged oral fluid   Paroxysmal A-fib: Rate controlled.  On Eliquis for anticoagulation.  Not on rate or rhythm control at home. -Continue holding Eliquis -Continue IV heparin -Continue metoprolol as needed. -Optimize electrolytes   Pelvic floor dysfunction/urinary retention/recurrent UTI-patient does self-catheterization.  CT abdomen and pelvis with stable changes of vaginal pexy with gas and fluid in the vagina extending up to the presacral region with some stranding. -Self caths as needed -Hold home prophylactic cefdinir while on IV Zosyn.   Left hip pain: Exam suspicious for left trochanteric bursitis.  No fall or trauma.  X-ray without significant finding.  Pain improving. -Offered orthopedic surgery consult for steroid injection but daughter wants to hold off this for now -Pain control with as needed Tylenol, oxycodone and  IV morphine. -Continue gabapentin  AKI on CKD-3A: Could be due to poor p.o. intake and/or IV Zosyn.  No urinary obstruction on imaging.  CK normal. Recent Labs    06/25/21 1536 08/17/21 1219 02/23/22 1153 02/24/22 0540 02/25/22 0701  BUN 40* 31* 40* 30* 31*   CREATININE 0.94 0.78 0.99 1.13* 1.29*  -Encourage oral hydration -Recheck in the morning  Normocytic anemia: Likely chronic.  Anemia panel within normal. Recent Labs    03/30/21 1446 06/25/21 1536 08/17/21 1219 09/03/21 1504 02/23/22 1153 02/24/22 0540  HGB 11.4* 10.6* 11.9* 12.5 11.6* 10.8*  -Continue monitoring  SSS s/p PPM: [facility] at [facility].  EKG with sinus rhythm.  Non-anion gap metabolic acidosis:  -Start p.o. sodium bicarbonate  Bronchiectasis, underlying MAI -Continue mucolytic's, nebs, pulmonary toilet and hypertonic saline   Pseudogout involving multiple joints: Stable.  Possible RLL mass: Seems stable compared to CT chest in 12/2021. Stable cystic lesion of pancreas -Outpatient follow-up.  Discussed with patient and patient's daughter.     There is no height or weight on file to calculate BMI.           DVT prophylaxis:  On full dose anticoagulation for A-fib.  Code Status: Full code Family Communication: Updated patient's daughter at bedside Level of care: Med-Surg Status is: Observation The patient will require care spanning > 2 midnights and should be moved to inpatient because: Acute diverticulitis with microperforation   Final disposition: Likely home once medically stable Consultants:  General surgery  55 minutes with more than 50% spent in reviewing records, counseling patient/family and coordinating care.   Sch Meds:  Scheduled Meds:  dorzolamide-timolol  1 drop Both Eyes BID   dronabinol  5 mg Oral BID AC   gabapentin  300 mg Oral BID   guaiFENesin  10 mL Oral TID   latanoprost  1 drop Both Eyes QHS   pantoprazole  40 mg Oral Daily   sodium bicarbonate  650 mg Oral TID   sodium chloride HYPERTONIC  4 mL Nebulization Daily   Continuous Infusions:  heparin 650 Units/hr (02/24/22 2334)   piperacillin-tazobactam (ZOSYN)  IV 3.375 g (02/25/22 0644)   PRN Meds:.acetaminophen **OR** [DISCONTINUED] acetaminophen, albuterol,  hydrALAZINE, ipratropium-albuterol, morphine injection, oxyCODONE, prochlorperazine, senna-docusate  Antimicrobials: Anti-infectives (From admission, onward)    Start     Dose/Rate Route Frequency Ordered Stop   02/24/22 1000  piperacillin-tazobactam (ZOSYN) IVPB 3.375 g        3.375 g 12.5 mL/hr over 240 Minutes Intravenous Every 8 hours 02/24/22 0457     02/24/22 0115  piperacillin-tazobactam (ZOSYN) IVPB 3.375 g        3.375 g 100 mL/hr over 30 Minutes Intravenous  Once 02/24/22 0102 02/24/22 0152        I have personally reviewed the following labs and images: CBC: Recent Labs  Lab 02/23/22 1153 02/24/22 0540  WBC 14.2* 10.5  NEUTROABS 11.2*  --   HGB 11.6* 10.8*  HCT 36.1 33.0*  MCV 91.6 90.7  PLT 317.0 277   BMP &GFR Recent Labs  Lab 02/23/22 1153 02/24/22 0540 02/25/22 0701  NA 137 135 141  K 5.0 3.6 3.9  CL 102 102 107  CO2 26 19* 15*  GLUCOSE 112* 100* 65*  BUN 40* 30* 31*  CREATININE 0.99 1.13* 1.29*  CALCIUM 9.6 8.6* 8.4*  MG  --  2.1 2.3  PHOS  --   --  4.0   Estimated Creatinine Clearance: 23.5 mL/min (A) (by C-G formula based on  SCr of 1.29 mg/dL (H)). Liver & Pancreas: Recent Labs  Lab 02/23/22 1153 02/24/22 0540 02/25/22 0701  AST 18 19  --   ALT 14 16  --   ALKPHOS 93 95  --   BILITOT 0.5 0.9  --   PROT 7.7 6.9  --   ALBUMIN 3.7 2.7* 2.5*   Recent Labs  Lab 02/23/22 1153  LIPASE 37.0   No results for input(s): "AMMONIA" in the last 168 hours. Diabetic: No results for input(s): "HGBA1C" in the last 72 hours. No results for input(s): "GLUCAP" in the last 168 hours. Cardiac Enzymes: Recent Labs  Lab 02/25/22 0701  CKTOTAL 45   Recent Labs    03/30/21 1446 06/25/21 1536  PROBNP 162.0* 138.0*   Coagulation Profile: Recent Labs  Lab 02/24/22 0617  INR 1.1   Thyroid Function Tests: No results for input(s): "TSH", "T4TOTAL", "FREET4", "T3FREE", "THYROIDAB" in the last 72 hours. Lipid Profile: No results for input(s):  "CHOL", "HDL", "LDLCALC", "TRIG", "CHOLHDL", "LDLDIRECT" in the last 72 hours. Anemia Panel: Recent Labs    02/24/22 0540 02/25/22 0631 02/25/22 0701  VITAMINB12  --   --  1,154*  FOLATE  --  >40.0  --   FERRITIN  --   --  161  TIBC  --   --  293  IRON  --   --  53  RETICCTPCT 1.1  --   --    Urine analysis:    Component Value Date/Time   COLORURINE YELLOW 08/17/2021 1315   APPEARANCEUR HAZY (A) 08/17/2021 1315   LABSPEC 1.015 08/17/2021 1315   PHURINE 8.0 08/17/2021 1315   GLUCOSEU NEGATIVE 08/17/2021 1315   GLUCOSEU NEGATIVE 07/12/2021 1700   HGBUR NEGATIVE 08/17/2021 1315   BILIRUBINUR NEGATIVE 08/17/2021 1315   BILIRUBINUR negative 02/26/2021 [place] 08/17/2021 1315   PROTEINUR NEGATIVE 08/17/2021 1315   UROBILINOGEN 0.2 07/12/2021 1700   NITRITE NEGATIVE 08/17/2021 1315   LEUKOCYTESUR SMALL (A) 08/17/2021 1315   Sepsis Labs: Invalid input(s): "PROCALCITONIN", "LACTICIDVEN"  Microbiology: Recent Results (from the past 240 hour(s))  Blood culture (routine x 2)     Status: None (Preliminary result)   Collection Time: 02/23/22  5:36 PM   Specimen: BLOOD  Result Value Ref Range Status   Specimen Description BLOOD RIGHT ANTECUBITAL  Final   Special Requests   Final    BOTTLES DRAWN AEROBIC AND ANAEROBIC Blood Culture results may not be optimal due to an inadequate volume of blood received in culture bottles   Culture   Final    NO GROWTH 2 DAYS Performed at [facility], [place] [address] [place], [place] [address]    Report Status PENDING  Incomplete  Blood culture (routine x 2)     Status: None (Preliminary result)   Collection Time: 02/24/22  1:20 AM   Specimen: BLOOD  Result Value Ref Range Status   Specimen Description BLOOD RIGHT ANTECUBITAL  Final   Special Requests   Final    BOTTLES DRAWN AEROBIC AND ANAEROBIC Blood Culture results may not be optimal due to an inadequate volume of blood received in culture bottles   Culture   Final     NO GROWTH 1 DAY Performed at [facility], [place] [address] [place], [place] [address]    Report Status PENDING  Incomplete    Radiology Studies: DG HIP UNILAT WITH PELVIS 2-3 VIEWS LEFT  Result Date: 02/24/2022 CLINICAL DATA:  Left hip pain.  No  known trauma. EXAM: DG HIP (WITH OR WITHOUT PELVIS) 2-3V LEFT COMPARISON:  CT abdomen and pelvis 02/23/2022 FINDINGS: There is diffuse decreased bone mineralization. Contrast is seen within the urinary bladder from IV contrast administered for yesterday's CT. There is also oral contrast seen within the ascending colon and proximal sigmoid colon. High-grade sigmoid diverticulosis is again noted. Vascular phleboliths overlie the pelvis. Mild-to-moderate bilateral femoroacetabular, mild pubic symphysis, and mild bilateral sacroiliac joint space narrowing and subchondral sclerosis. Normal morphology of the left femoral head-neck junction without CAM-type bump deformity. Mild superolateral left acetabular degenerative osteophytosis. No acute fracture or dislocation.  Moderate vascular calcifications. IMPRESSION: 1. No acute fracture. 2. Mild-to-moderate bilateral femoroacetabular osteoarthritis. Electronically Signed   By: Yvonne Kendall M.D.   On: 02/24/2022 12:14      Teandra Harlan T. [place]  If 7PM-7AM, please contact night-coverage [email or web address] 02/25/2022, 11:25 AM

## 2022-02-25 NOTE — Progress Notes (Signed)
[facility] Progress Note     Subjective: CC:   Pts daughter at bedside. Patient is very [place]. She reports some cramping gas pain, but denies severe pain at rest. Denies nausea vomiting. Asking for Pedialyte. Had some non-bloody diarrhea yesterday  Objective: Vital signs in last 24 hours: Temp:  [97.6 F (36.4 C)-98 F (36.7 C)] 97.6 F (36.4 C) (02/23 0621) Pulse Rate:  [77-102] 102 (02/23 0621) Resp:  [16-17] 17 (02/23 0621) BP: (125-138)/(62-68) 138/64 (02/23 0621) SpO2:  [96 %-98 %] 98 % (02/23 0621) Last BM Date : 02/24/22  Intake/Output from previous day: 02/22 0701 - 02/23 0700 In: 156.4 [I.V.:56.4; IV Piggyback:100] Out: -  Intake/Output this shift: No intake/output data recorded.  PE: Gen:  Alert, NAD, pleasant Card:  Regular rate and rhythm, pedal pulses 2+ BL Pulm:  Normal effort, clear to auscultation bilaterally Abd: Soft, mild distention, TTP LLQ and RLQ with mild guarding, no peritonitis  Skin: warm and dry, no rashes  Psych: A&Ox3   Lab Results:  Recent Labs    02/23/22 1153 02/24/22 0540  WBC 14.2* 10.5  HGB 11.6* 10.8*  HCT 36.1 33.0*  PLT 317.0 277   BMET Recent Labs    02/24/22 0540 02/25/22 0701  NA 135 141  K 3.6 3.9  CL 102 107  CO2 19* 15*  GLUCOSE 100* 65*  BUN 30* 31*  CREATININE 1.13* 1.29*  CALCIUM 8.6* 8.4*   PT/INR Recent Labs    02/24/22 0617  LABPROT 13.7  INR 1.1   CMP     Component Value Date/Time   NA 141 02/25/2022 0701   NA 142 02/23/2018 1525   K 3.9 02/25/2022 0701   CL 107 02/25/2022 0701   CO2 15 (L) 02/25/2022 0701   GLUCOSE 65 (L) 02/25/2022 0701   BUN 31 (H) 02/25/2022 0701   BUN 17 02/23/2018 1525   CREATININE 1.29 (H) 02/25/2022 0701   CALCIUM 8.4 (L) 02/25/2022 0701   PROT 6.9 02/24/2022 0540   ALBUMIN 2.5 (L) 02/25/2022 0701   AST 19 02/24/2022 0540   ALT 16 02/24/2022 0540   ALKPHOS 95 02/24/2022 0540   BILITOT 0.9 02/24/2022 0540   GFRNONAA 39 (L) 02/25/2022 0701    GFRAA 70 02/23/2018 1525   Lipase     Component Value Date/Time   LIPASE 37.0 02/23/2022 1153       Studies/Results: DG HIP UNILAT WITH PELVIS 2-3 VIEWS LEFT  Result Date: 02/24/2022 CLINICAL DATA:  Left hip pain.  No known trauma. EXAM: DG HIP (WITH OR WITHOUT PELVIS) 2-3V LEFT COMPARISON:  CT abdomen and pelvis 02/23/2022 FINDINGS: There is diffuse decreased bone mineralization. Contrast is seen within the urinary bladder from IV contrast administered for yesterday's CT. There is also oral contrast seen within the ascending colon and proximal sigmoid colon. High-grade sigmoid diverticulosis is again noted. Vascular phleboliths overlie the pelvis. Mild-to-moderate bilateral femoroacetabular, mild pubic symphysis, and mild bilateral sacroiliac joint space narrowing and subchondral sclerosis. Normal morphology of the left femoral head-neck junction without CAM-type bump deformity. Mild superolateral left acetabular degenerative osteophytosis. No acute fracture or dislocation.  Moderate vascular calcifications. IMPRESSION: 1. No acute fracture. 2. Mild-to-moderate bilateral femoroacetabular osteoarthritis. Electronically Signed   By: Yvonne Kendall M.D.   On: 02/24/2022 12:14   DG Chest Portable 1 View  Result Date: 02/24/2022 CLINICAL DATA:  Cough and abdominal pain EXAM: PORTABLE CHEST 1 VIEW COMPARISON:  10/15/2021 FINDINGS: Cardiac shadow is stable. Lead less pacemaker is again noted and  stable. Chronic infiltrate is seen in the right base stable from the prior study. No new focal abnormality is seen. No bony changes are noted. IMPRESSION: Chronic somewhat nodular infiltrate in the right base stable from multiple previous exams. Electronically Signed   By: Inez Catalina M.D.   On: 02/24/2022 01:41   CT Abdomen Pelvis W Contrast  Result Date: 02/23/2022 CLINICAL DATA:  Acute abdominal pain nonlocalized lower abdomen. Diarrhea. Weakness EXAM: CT ABDOMEN AND PELVIS WITH CONTRAST TECHNIQUE:  Multidetector CT imaging of the abdomen and pelvis was performed using the standard protocol following bolus administration of intravenous contrast. RADIATION DOSE REDUCTION: This exam was performed according to the departmental dose-optimization program which includes automated exposure control, adjustment of the mA and/or kV according to patient size and/or use of iterative reconstruction technique. CONTRAST:  31m OMNIPAQUE IOHEXOL 300 MG/ML  SOLN COMPARISON:  CT 08/07/2021 and older FINDINGS: Lower chest: As seen on the prior examination there areas of nodularity with opacity along the right lower lobe. Please correlate with prior workup. This would have a differential including aggressive process. This is similar to the recent chest CT of 12/15/2021. Stable leadless pacemaker again noted along the anterior margin of the right ventricle. Hepatobiliary: Previous cholecystectomy. Minimal ectasia of the biliary tree. No space-occupying liver lesion. Pancreas: Small cysts again seen along the midbody of the pancreas inferiorly, unchanged from previous measuring up to 12 mm. These appear slightly smaller today. Spleen: Splenic granulomas. Adrenals/Urinary Tract: Adrenal glands are preserved. Mild bilateral renal atrophy with stable Bosniak 1 bilateral small renal cysts. No specific imaging follow-up. The ureters have normal course and caliber extending down to the bladder. Stomach/Bowel: Colonic diverticulosis identified. There is wall thickening and stranding along the descending colon consistent with an area of diverticulitis. On sagittal image 90 of series 6 there are some tiny bolus of air immediately adjacent to the wall of the bowel in this location with confluence stranding. This could represent a microperforation. This area of ill-defined fluid on axial image 56 of series 2 measures 2.3 by 1.5 cm. More proximally the colon is nondilated. Small hiatal hernia. The small bowel is nondilated. Vascular/Lymphatic:  Normal caliber aorta and IVC. Scattered vascular calcifications identified. There are areas of stenosis suggested along the origin of the right renal artery. Please correlate for any level of hypertension. Reproductive: Uterus itself is absent. No adnexal mass. As seen previously there is again elongation of the vaginal canal extending to the presacral region. Again seen on coronal and sagittal images. Sagittal image 61 of series 6. History of vaginal pexy. Again the etiology of the luminal gas and fluid within this structure is uncertain. There are some adjacent loops of small bowel and some mild stranding. Other: No abdominal wall hernia or abnormality. No abdominopelvic ascites. Musculoskeletal: Degenerative changes seen of the spine and pelvis. IMPRESSION: Colonic diverticulosis with an area of inflammatory stranding and wall thickening along the mid descending colon consistent with an area of diverticulitis. Few bubbles of air immediately adjacent to the bowel wall posteriorly in this location could represent microperforation with early phlegmonous change. No widespread free air or obstruction. Recommend follow-up to confirm clearance. Stable changes of vaginal pexy with gas and fluid in the vagina extending up to the presacral region with some stranding. Please correlate with any prior workup. Stable cystic lesions along the midbody of the pancreas. In light of the patient's age simple follow up in 2 years could be performed. Masslike opacities in the right lower lobe of  the lung as on prior CT of the chest from December 2023. Electronically Signed   By: Jill Side M.D.   On: 02/23/2022 15:19    Anti-infectives: Anti-infectives (From admission, onward)    Start     Dose/Rate Route Frequency Ordered Stop   02/24/22 1000  piperacillin-tazobactam (ZOSYN) IVPB 3.375 g        3.375 g 12.5 mL/hr over 240 Minutes Intravenous Every 8 hours 02/24/22 0457     02/24/22 0115  piperacillin-tazobactam (ZOSYN)  IVPB 3.375 g        3.375 g 100 mL/hr over 30 Minutes Intravenous  Once 02/24/22 0102 02/24/22 0152        Assessment/Plan  89 yo female presenting with acute LLQ abdominal pain and imaging evidence of diverticulitis with a microperforation   - afebrile, WBC 10.5 yesterday from 14.2, AM labs are pending  - Abd exam: still with significant lower abdominal tenderness - start CLD - if electrolytes are stable today it is ok to skip labs tomorrow and get labs on Sunday from a surgical standpoint. Would minimize blood draws as able in this patient.    FEN: CLD, IVF per primary ID: Zosyn VTE: hep gtt for history of a fib    LOS: 0 days   I reviewed nursing notes, hospitalist notes, last 24 h vitals and pain scores, last 48 h intake and output, last 24 h labs and trends, and last 24 h imaging results.  This care required straight-forward level of medical decision making.   Obie Dredge, PA-C [facility] Please see Amion for pager number during day hours 7:00am-4:30pm

## 2022-02-25 NOTE — Progress Notes (Signed)
Physical Therapy Treatment Patient Details Name: Julie Silva MRN: [id number] DOB: 1927-09-08 Today's Date: 02/25/2022   History of Present Illness 89 yo female presents to [facility] from PCP on 2/21 with diverticulitis with possible microperforation. PMH includes PAF, bronchitic stasis, psuedogout, diverticulosis, CAD, self-catheterizes due to previous vaginal surgery, OP, OA, cholecystectomy, appendectomy.    PT Comments    Pt reports less abdominal and L hip pain this date. Pt ambulatory in hallway with use of RW and close guard for safety, requires min cues for form/safety throughout. Pt's granddaughter present for session and supportive. Pt progressing well from a mobility standpoint.     Recommendations for follow up therapy are one component of a multi-disciplinary discharge planning process, led by the attending physician.  Recommendations may be updated based on patient status, additional functional criteria and insurance authorization.  Follow Up Recommendations  No PT follow up     Assistance Recommended at Discharge PRN  Patient can return home with the following A little help with walking and/or transfers;A little help with bathing/dressing/bathroom   Equipment Recommendations  None recommended by PT    Recommendations for Other Services       Precautions / Restrictions Precautions Precautions: Fall Restrictions Weight Bearing Restrictions: No     Mobility  Bed Mobility Overal bed mobility: Needs Assistance Bed Mobility: Supine to Sit           General bed mobility comments: sitting EOB upon PT arrival    Transfers Overall transfer level: Needs assistance Equipment used: Rolling walker (2 wheels) Transfers: Sit to/from Stand Sit to Stand: Min guard           General transfer comment: for safety, cues for hand placement. STS x2 from EOB and x1 from toilet.    Ambulation/Gait Ambulation/Gait assistance: Min guard Gait Distance (Feet): 190  Feet Assistive device: Rolling walker (2 wheels) Gait Pattern/deviations: Step-through pattern, Decreased stride length, Trunk flexed Gait velocity: decr     General Gait Details: cues for upright posture and placement in RW   Stairs             Wheelchair Mobility    Modified Rankin (Stroke Patients Only)       Balance Overall balance assessment: Needs assistance Sitting-balance support: No upper extremity supported, Feet supported Sitting balance-Leahy Scale: Fair     Standing balance support: Bilateral upper extremity supported, During functional activity, Reliant on assistive device for balance Standing balance-Leahy Scale: Poor                              Cognition Arousal/Alertness: Awake/alert Behavior During Therapy: WFL for tasks assessed/performed Overall Cognitive Status: Within Functional Limits for tasks assessed                                          Exercises      General Comments        Pertinent Vitals/Pain Pain Assessment Pain Assessment: Faces Faces Pain Scale: Hurts a little bit Pain Location: abdomen, L hip Pain Descriptors / Indicators: Sore, Discomfort Pain Intervention(s): Limited activity within patient's tolerance, Monitored during session, Repositioned    Home Living                          Prior Function  PT Goals (current goals can now be found in the care plan section) Acute Rehab PT Goals Patient Stated Goal: home PT Goal Formulation: With patient/family Time For Goal Achievement: 03/10/22 Potential to Achieve Goals: Good Progress towards PT goals: Progressing toward goals    Frequency    Min 3X/week      PT Plan Current plan remains appropriate    Co-evaluation              AM-PAC PT "6 Clicks" Mobility   Outcome Measure  Help needed turning from your back to your side while in a flat bed without using bedrails?: A Little Help needed moving  from lying on your back to sitting on the side of a flat bed without using bedrails?: A Little Help needed moving to and from a bed to a chair (including a wheelchair)?: A Little Help needed standing up from a chair using your arms (e.g., wheelchair or bedside chair)?: A Little Help needed to walk in hospital room?: A Little Help needed climbing 3-5 steps with a railing? : A Little 6 Click Score: 18    End of Session   Activity Tolerance: Patient tolerated treatment well Patient left: with call bell/phone within reach;with family/visitor present;in chair (family stays with pt 24/7) Nurse Communication: Mobility status PT Visit Diagnosis: Other abnormalities of gait and mobility (R26.89);Muscle weakness (generalized) (M62.81)     Time: [id number] PT Time Calculation (min) (ACUTE ONLY): 17 min  Charges:  $Gait Training: 8-22 mins                     Stacie Glaze, PT DPT Acute Rehabilitation Services Pager [phone]  Office [phone]    [place] 02/25/2022, 5:09 PM

## 2022-02-25 NOTE — Progress Notes (Signed)
ANTICOAGULATION CONSULT NOTE - Initial Consult  Pharmacy Consult for Heparin Indication: atrial fibrillation  Allergies  Allergen Reactions   Codeine Other (See Comments)    Passed out   Colchicine Diarrhea    Patient Measurements:    Vital Signs: Temp: 97.6 F (36.4 C) (02/23 0621) Temp Source: Oral (02/23 0621) BP: 138/64 (02/23 0621) Pulse Rate: 102 (02/23 0621)  Labs: Recent Labs    02/23/22 1153 02/24/22 0540 02/24/22 0617 02/24/22 1950 02/25/22 0701  HGB 11.6* 10.8*  --   --   --   HCT 36.1 33.0*  --   --   --   PLT 317.0 277  --   --   --   APTT  --   --  48* 81* 74*  LABPROT  --   --  13.7  --   --   INR  --   --  1.1  --   --   HEPARINUNFRC  --   --  >1.10*  --  0.69  CREATININE 0.99 1.13*  --   --  1.29*  CKTOTAL  --   --   --   --  45     Estimated Creatinine Clearance: 23.5 mL/min (A) (by C-G formula based on SCr of 1.29 mg/dL (H)).   Medical History: Past Medical History:  Diagnosis Date   Anemia    Aortic atherosclerosis (HCC)    Arthritis    Osteoporosis, neck stiffness   Atrial fibrillation ([place])    Bleeds easily [facility])    "father was a hemophiliac" - pt not being bothered in recent years.   CAD (coronary artery disease)    Calcium deposit in bursa of knee 2017   Cancer ([place])    skin cancer "basal cell".   Chicken pox    Cyst, Baker's knee    left knee- tx. Cortisone injection 05-05-15 in office- "improved pain relief and swelling left ankle and knee"   Diverticulitis    Diverticulosis    Dysrhythmia    Dr. McAlhany-cardiology   GERD (gastroesophageal reflux disease)    Glaucoma of both eyes    Gout    Hearing loss of both ears    Bilateral ears- hearing aids used- right ear hearing betther than left.   Hiatal hernia    History of blood transfusion 1957   "lots; most related to my periods"-last 1968 -s/p Hysterectomy   Migraines    "years ago" (02/04/2013)   Motion sickness    back seat - cars, sudden movements   PAF (paroxysmal  atrial fibrillation) (HCC)    Pancreatic cyst    Pelvic floor dysfunction    PONV (postoperative nausea and vomiting)    Risk for falls    "wobbley" per daughter   Self-catheterizes urinary bladder    "daily- retained urine and chronic UTI"   SVT (supraventricular tachycardia)    Dr. Darnell Level. Taylor-follows "loop recorder left chest"   Swallowing difficulty    freq occ. mostley liquids   Urine incontinence    UTI (urinary tract infection)    Chronic Urinary tract infections- tx Macrobid daily.   Vertigo    Wears hearing aid in both ears     Medications:  Medications Prior to Admission  Medication Sig Dispense Refill Last Dose   acetaminophen (TYLENOL) 500 MG tablet Take 500 mg by mouth 2 (two) times daily.   Past Week   bimatoprost (LUMIGAN) 0.01 % SOLN Place 1 drop into both eyes at bedtime.   Past  Week   cefdinir (OMNICEF) 250 MG/5ML suspension Take 250 mg by mouth daily.   02/22/2022   dorzolamide-timolol (COSOPT) 22.3-6.8 MG/ML ophthalmic solution Place 1 drop into both eyes 2 (two) times daily.   Past Week   dronabinol (MARINOL) 5 MG capsule Take 5 mg by mouth 2 (two) times daily before a meal.   Past Week   ELIQUIS 2.5 MG TABS tablet TAKE 1 TABLET TWICE A DAY (Patient taking differently: Take 2.5 mg by mouth 2 (two) times daily.) 180 tablet 0 02/22/2022 at am   estradiol (ESTRACE) 0.1 MG/GM vaginal cream Place 1 Applicatorful vaginally every other day.   Past Week   gabapentin (NEURONTIN) 100 MG capsule Take 300 mg by mouth 2 (two) times daily.   Past Week   guaifenesin (ROBITUSSIN) 100 MG/5ML syrup Take 100 mg by mouth 3 (three) times daily.   Past Week   Melatonin 1 MG CHEW Chew 1 tablet by mouth as needed (sleep).   Past Month   omeprazole (PRILOSEC) 20 MG capsule TAKE 1 CAPSULE BY MOUTH EVERY DAY (Patient taking differently: Take 20 mg by mouth daily. TAKE 1 CAPSULE BY MOUTH EVERY DAY) 90 capsule 1 Past Week   ondansetron (ZOFRAN-ODT) 4 MG disintegrating tablet Take 1 tablet (4 mg  total) by mouth every 8 (eight) hours as needed. (Patient taking differently: Take 4 mg by mouth every 8 (eight) hours as needed for nausea or vomiting.) 20 tablet 0 unknown   prochlorperazine (COMPAZINE) 10 MG tablet Take 10 mg by mouth every 4 (four) hours as needed for nausea or vomiting.   unknown   sodium chloride HYPERTONIC 3 % nebulizer solution Take by nebulization 2 (two) times daily as needed for other. 750 mL 12 unknown   Wheat Dextrin (BENEFIBER PO) Take 5 mLs by mouth 2 (two) times daily.   Past Week   Catheters MISC Replace catheter twice daily. 180 each 1    dronabinol (MARINOL) 2.5 MG capsule Take 1 capsule (2.5 mg total) by mouth 2 (two) times daily before lunch and supper. (Patient not taking: Reported on 02/24/2022) 180 capsule 1 Not Taking    Assessment: 89 y.o. female admitted with diverticulitis, h/o Afib and Eliquis on hold, for heparin  PTT came back therapeutic again this AM at 89. Heparin level still not correlating with APTT at 0.69, indicating continued effect of DOAC on HL. We will cont with the current rate and check a confirm in AM.   Goal of Therapy:  aPTT 66-102 sec Heparin level 0.3-0.7 units/ml Monitor platelets by anticoagulation protocol: Yes   Plan:  Cont heparin 650 units/hr Heparin level and PTT daily  Jacqulene Huntley A. Levada Dy, PharmD, BCPS, FNKF Clinical Pharmacist [place] Please utilize Amion for appropriate phone number to reach the unit pharmacist ([place])  02/25/2022 8:13 AM

## 2022-02-26 DIAGNOSIS — K5792 Diverticulitis of intestine, part unspecified, without perforation or abscess without bleeding: Secondary | ICD-10-CM | POA: Diagnosis not present

## 2022-02-26 DIAGNOSIS — N179 Acute kidney failure, unspecified: Secondary | ICD-10-CM | POA: Diagnosis not present

## 2022-02-26 DIAGNOSIS — E876 Hypokalemia: Secondary | ICD-10-CM

## 2022-02-26 LAB — RENAL FUNCTION PANEL
Albumin: 2.6 g/dL — ABNORMAL LOW (ref 3.5–5.0)
Anion gap: 15 (ref 5–15)
BUN: 25 mg/dL — ABNORMAL HIGH (ref 8–23)
CO2: 19 mmol/L — ABNORMAL LOW (ref 22–32)
Calcium: 8.2 mg/dL — ABNORMAL LOW (ref 8.9–10.3)
Chloride: 104 mmol/L (ref 98–111)
Creatinine, Ser: 1.37 mg/dL — ABNORMAL HIGH (ref 0.44–1.00)
GFR, Estimated: 36 mL/min — ABNORMAL LOW (ref >60)
Glucose, Bld: 75 mg/dL (ref 70–99)
Phosphorus: 3.1 mg/dL (ref 2.5–4.6)
Potassium: 3.3 mmol/L — ABNORMAL LOW (ref 3.5–5.1)
Sodium: 138 mmol/L (ref 135–145)

## 2022-02-26 LAB — CBC
HCT: 31.1 % — ABNORMAL LOW (ref 36.0–46.0)
Hemoglobin: 9.9 g/dL — ABNORMAL LOW (ref 12.0–15.0)
MCH: 29.2 pg (ref 26.0–34.0)
MCHC: 31.8 g/dL (ref 30.0–36.0)
MCV: 91.7 fL (ref 80.0–100.0)
Platelets: 258 10*3/uL (ref 150–400)
RBC: 3.39 MIL/uL — ABNORMAL LOW (ref 3.87–5.11)
RDW: 13.4 % (ref 11.5–15.5)
WBC: 8 10*3/uL (ref 4.0–10.5)
nRBC: 0 % (ref 0.0–0.2)

## 2022-02-26 LAB — HEPARIN LEVEL (UNFRACTIONATED): Heparin Unfractionated: 0.46 IU/mL (ref 0.30–0.70)

## 2022-02-26 LAB — APTT: aPTT: 114 seconds — ABNORMAL HIGH (ref 24–36)

## 2022-02-26 LAB — MAGNESIUM: Magnesium: 2 mg/dL (ref 1.7–2.4)

## 2022-02-26 MED ORDER — SODIUM CHLORIDE 0.45 % IV SOLN: INTRAVENOUS | Status: DC

## 2022-02-26 MED ORDER — ENSURE ENLIVE PO LIQD
237.0000 mL | Freq: Two times a day (BID) | ORAL | Status: DC
  Administered 2022-02-26 – 2022-02-27 (×2): 237 mL via ORAL

## 2022-02-26 MED ORDER — POTASSIUM CHLORIDE CRYS ER 20 MEQ PO TBCR
40.0000 meq | EXTENDED_RELEASE_TABLET | Freq: Once | ORAL | Status: AC
  Administered 2022-02-26: 40 meq via ORAL
  Filled 2022-02-26: qty 2

## 2022-02-26 NOTE — Progress Notes (Signed)
PROGRESS NOTE  Julie Silva [id number] DOB: Jan 03, 1928   PCP: Janith Lima, MD  Patient is from: Home  DOA: 02/23/2022 LOS: 1  Chief complaints Chief Complaint  Patient presents with   Abdominal Pain     Brief Narrative / Interim history: 89 year old F with PMH of paroxysmal A-fib on Eliquis, SSS with PPM, bronchiectasis/MAI, diverticulosis, CAD, pseudogout, recurrent UTI and urinary retention in the setting of pelvic floor weakness/dysfunction with self-catheterization presented to PCP with LLQ pain, nausea, vomiting and diarrhea.  She had leukocytosis.  CT abdomen and pelvis concerning for diverticulitis with microperforation, and she was directed to the hospital for further care.  Started on IV Zosyn.  General surgery consulted. Patient also has cramping right hip pain.  No history of fall or trauma.  X-ray without acute finding.  Subjective: Patient sitting up in the chair.  Daughter is at the bedside.  Patient mentions that her abdominal symptoms have improved.  She did have nausea this morning requiring medications but denies any vomiting.  No nausea currently.  Did have loose bowel movement this morning.  Objective: Vitals:   02/25/22 0828 02/25/22 2148 02/26/22 0514 02/26/22 0743  BP: (!) 146/78 (!) 134/46 119/72   Pulse: (!) 108 67 73   Resp:  17 17   Temp: 98.3 F (36.8 C) 98.4 F (36.9 C) 98.3 F (36.8 C)   TempSrc:      SpO2: 99% 97% 98% 98%    Examination:  General appearance: Awake alert.  In no distress Resp: Clear to auscultation bilaterally.  Normal effort Cardio: S1-S2 is normal regular.  No S3-S4.  No rubs murmurs or bruit GI: Abdomen is soft.  Tender in the left lower quadrant without any rebound rigidity or guarding.  No masses organomegaly.  Bowel sounds present but sluggish. Extremities: No edema.  Full range of motion of lower extremities. Neurologic: Alert and oriented x3.  No focal neurological deficits.    Assessment and plan:  Acute  diverticulitis with microperforation CT with evidence for diverticulitis with microperforation Patient started on Zosyn.  General surgery was consulted.  Seems to be doing better than at the time of admission.  Symptoms are improving.  Still has nausea. To be advanced to full liquid diet today.  Mobilize.     Paroxysmal A-fib Rate controlled.  Prior to admission she was on Eliquis for anticoagulation.  Not on rate or rhythm control at home. -Continue holding Eliquis -Continue IV heparin -Continue metoprolol as needed. -Optimize electrolytes   Pelvic floor dysfunction/urinary retention/recurrent UTI Patient does self-catheterization.  CT abdomen and pelvis with stable changes of vaginal pexy with gas and fluid in the vagina extending up to the presacral region with some stranding. -Self caths as needed -Hold home prophylactic cefdinir while on IV Zosyn.   Left hip pain Exam was suspicious for left trochanteric bursitis.  No fall or trauma.  X-ray without significant finding.  Pain improving. Offered orthopedic surgery consult for steroid injection but daughter wants to hold off this for now Pain control with as needed Tylenol, oxycodone and IV morphine. Continue gabapentin Stable for the most part.  AKI on CKD-3A/normal anion gap metabolic acidosis Creatinine continues to rise.  Will initiate IV fluids.  Recheck labs tomorrow.  Monitor urine output.  Avoid nephrotoxic agents.  Continue sodium bicarbonate.  Levels have improved this morning.  Hypokalemia Will be repleted.  Magnesium is 2.0.  Normocytic anemia Likely chronic.  Anemia panel within normal. No evidence of overt bleeding.  SSS s/p PPM Followed at [facility].  EKG with sinus rhythm.  Bronchiectasis, underlying MAI -Continue mucolytic's, nebs, pulmonary toilet and hypertonic saline Abnormal right lower lobe findings noted on CT scan.  Seems to be stable compared to CT chest in December 2023.  Will defer to outpatient  providers.   Pseudogout involving multiple joints Stable.  Stable cystic lesion of pancreas -Outpatient follow-up.  Patient and daughter were notified of these findings by previous attending.   DVT prophylaxis: On full dose anticoagulation for A-fib. Code Status: Full code Family Communication: Updated patient's daughter at bedside Disposition: Hopefully return home when improved.  Mobilize.   Consultants:  General surgery   Sch Meds:  Scheduled Meds:  dorzolamide-timolol  1 drop Both Eyes BID   dronabinol  5 mg Oral BID AC   feeding supplement  237 mL Oral BID BM   gabapentin  300 mg Oral BID   guaiFENesin  10 mL Oral TID   latanoprost  1 drop Both Eyes QHS   pantoprazole  40 mg Oral Daily   sodium bicarbonate  650 mg Oral TID   sodium chloride HYPERTONIC  4 mL Nebulization BID   Continuous Infusions:  heparin 650 Units/hr (02/25/22 2313)   piperacillin-tazobactam (ZOSYN)  IV 3.375 g (02/26/22 0547)   PRN Meds:.acetaminophen **OR** [DISCONTINUED] acetaminophen, albuterol, hydrALAZINE, ipratropium-albuterol, morphine injection, oxyCODONE, prochlorperazine, senna-docusate  Antimicrobials: Anti-infectives (From admission, onward)    Start     Dose/Rate Route Frequency Ordered Stop   02/24/22 1000  piperacillin-tazobactam (ZOSYN) IVPB 3.375 g        3.375 g 12.5 mL/hr over 240 Minutes Intravenous Every 8 hours 02/24/22 0457     02/24/22 0115  piperacillin-tazobactam (ZOSYN) IVPB 3.375 g        3.375 g 100 mL/hr over 30 Minutes Intravenous  Once 02/24/22 0102 02/24/22 0152       CBC: Recent Labs  Lab 02/23/22 1153 02/24/22 0540 02/26/22 0642  WBC 14.2* 10.5 8.0  NEUTROABS 11.2*  --   --   HGB 11.6* 10.8* 9.9*  HCT 36.1 33.0* 31.1*  MCV 91.6 90.7 91.7  PLT 317.0 277 258    BMP &GFR Recent Labs  Lab 02/23/22 1153 02/24/22 0540 02/25/22 0701 02/26/22 0642  NA 137 135 141 138  K 5.0 3.6 3.9 3.3*  CL 102 102 107 104  CO2 26 19* 15* 19*  GLUCOSE 112*  100* 65* 75  BUN 40* 30* 31* 25*  CREATININE 0.99 1.13* 1.29* 1.37*  CALCIUM 9.6 8.6* 8.4* 8.2*  MG  --  2.1 2.3 2.0  PHOS  --   --  4.0 3.1    Estimated Creatinine Clearance: 22.2 mL/min (A) (by C-G formula based on SCr of 1.37 mg/dL (H)).  Liver & Pancreas: Recent Labs  Lab 02/23/22 1153 02/24/22 0540 02/25/22 0701 02/26/22 0642  AST 18 19  --   --   ALT 14 16  --   --   ALKPHOS 93 95  --   --   BILITOT 0.5 0.9  --   --   PROT 7.7 6.9  --   --   ALBUMIN 3.7 2.7* 2.5* 2.6*    Recent Labs  Lab 02/23/22 1153  LIPASE 37.0    Cardiac Enzymes: Recent Labs  Lab 02/25/22 0701  CKTOTAL 45    Recent Labs    03/30/21 1446 06/25/21 1536  PROBNP 162.0* 138.0*    Coagulation Profile: Recent Labs  Lab 02/24/22 0617  INR 1.1  Anemia Panel: Recent Labs    02/24/22 0540 02/25/22 0631 02/25/22 0701  VITAMINB12  --   --  1,154*  FOLATE  --  >40.0  --   FERRITIN  --   --  161  TIBC  --   --  293  IRON  --   --  53  RETICCTPCT 1.1  --   --      Microbiology: Recent Results (from the past 240 hour(s))  Blood culture (routine x 2)     Status: None (Preliminary result)   Collection Time: 02/23/22  5:36 PM   Specimen: BLOOD  Result Value Ref Range Status   Specimen Description BLOOD RIGHT ANTECUBITAL  Final   Special Requests   Final    BOTTLES DRAWN AEROBIC AND ANAEROBIC Blood Culture results may not be optimal due to an inadequate volume of blood received in culture bottles   Culture   Final    NO GROWTH 3 DAYS Performed at [facility], [place] [address], [place], [place] [address]    Report Status PENDING  Incomplete  Blood culture (routine x 2)     Status: None (Preliminary result)   Collection Time: 02/24/22  1:20 AM   Specimen: BLOOD  Result Value Ref Range Status   Specimen Description BLOOD RIGHT ANTECUBITAL  Final   Special Requests   Final    BOTTLES DRAWN AEROBIC AND ANAEROBIC Blood Culture results may not be optimal due to an inadequate  volume of blood received in culture bottles   Culture   Final    NO GROWTH 2 DAYS Performed at [facility], [place] [address], [place], [place] [address]    Report Status PENDING  Incomplete    Radiology Studies: No results found.   [place]  [facility]  If 7PM-7AM, please contact night-coverage [email or web address] 02/26/2022, 11:01 AM

## 2022-02-26 NOTE — Progress Notes (Signed)
[facility] Progress Note     Subjective: CC:  Patient feels her abdominal pain is improving. She is tolerating CLD. Multiple loose stools last 24h. Has been mobilizing in the hallway. Denies nausea.   Pts daughter at bedside.   Objective: Vital signs in last 24 hours: Temp:  [98.3 F (36.8 C)-98.4 F (36.9 C)] 98.3 F (36.8 C) (02/24 0514) Pulse Rate:  [67-73] 73 (02/24 0514) Resp:  [17] 17 (02/24 0514) BP: (119-134)/(46-72) 119/72 (02/24 0514) SpO2:  [97 %-98 %] 98 % (02/24 0743) Last BM Date : 02/24/22  Intake/Output from previous day: 02/23 0701 - 02/24 0700 In: 120 [P.O.:120] Out: -  Intake/Output this shift: No intake/output data recorded.  PE: Gen:  Alert, NAD, pleasant Card:  Regular rate and rhythm, pedal pulses 2+ BL Pulm:  Normal effort, clear to auscultation bilaterally Abd: Soft, mild distention, TTP LLQ without guarding - improved compared to yesterday Skin: warm and dry, no rashes  Psych: A&Ox3   Lab Results:  Recent Labs    02/24/22 0540 02/26/22 0642  WBC 10.5 8.0  HGB 10.8* 9.9*  HCT 33.0* 31.1*  PLT 277 258   BMET Recent Labs    02/25/22 0701 02/26/22 0642  NA 141 138  K 3.9 3.3*  CL 107 104  CO2 15* 19*  GLUCOSE 65* 75  BUN 31* 25*  CREATININE 1.29* 1.37*  CALCIUM 8.4* 8.2*   PT/INR Recent Labs    02/24/22 0617  LABPROT 13.7  INR 1.1   CMP     Component Value Date/Time   NA 138 02/26/2022 0642   NA 142 02/23/2018 1525   K 3.3 (L) 02/26/2022 0642   CL 104 02/26/2022 0642   CO2 19 (L) 02/26/2022 0642   GLUCOSE 75 02/26/2022 0642   BUN 25 (H) 02/26/2022 0642   BUN 17 02/23/2018 1525   CREATININE 1.37 (H) 02/26/2022 0642   CALCIUM 8.2 (L) 02/26/2022 0642   PROT 6.9 02/24/2022 0540   ALBUMIN 2.6 (L) 02/26/2022 0642   AST 19 02/24/2022 0540   ALT 16 02/24/2022 0540   ALKPHOS 95 02/24/2022 0540   BILITOT 0.9 02/24/2022 0540   GFRNONAA 36 (L) 02/26/2022 0642   GFRAA 70 02/23/2018 1525   Lipase      Component Value Date/Time   LIPASE 37.0 02/23/2022 1153       Studies/Results: DG HIP UNILAT WITH PELVIS 2-3 VIEWS LEFT  Result Date: 02/24/2022 CLINICAL DATA:  Left hip pain.  No known trauma. EXAM: DG HIP (WITH OR WITHOUT PELVIS) 2-3V LEFT COMPARISON:  CT abdomen and pelvis 02/23/2022 FINDINGS: There is diffuse decreased bone mineralization. Contrast is seen within the urinary bladder from IV contrast administered for yesterday's CT. There is also oral contrast seen within the ascending colon and proximal sigmoid colon. High-grade sigmoid diverticulosis is again noted. Vascular phleboliths overlie the pelvis. Mild-to-moderate bilateral femoroacetabular, mild pubic symphysis, and mild bilateral sacroiliac joint space narrowing and subchondral sclerosis. Normal morphology of the left femoral head-neck junction without CAM-type bump deformity. Mild superolateral left acetabular degenerative osteophytosis. No acute fracture or dislocation.  Moderate vascular calcifications. IMPRESSION: 1. No acute fracture. 2. Mild-to-moderate bilateral femoroacetabular osteoarthritis. Electronically Signed   By: Yvonne Kendall M.D.   On: 02/24/2022 12:14    Anti-infectives: Anti-infectives (From admission, onward)    Start     Dose/Rate Route Frequency Ordered Stop   02/24/22 1000  piperacillin-tazobactam (ZOSYN) IVPB 3.375 g        3.375 g 12.5 mL/hr  over 240 Minutes Intravenous Every 8 hours 02/24/22 0457     02/24/22 0115  piperacillin-tazobactam (ZOSYN) IVPB 3.375 g        3.375 g 100 mL/hr over 30 Minutes Intravenous  Once 02/24/22 0102 02/24/22 0152        Assessment/Plan  89 yo female presenting with acute LLQ abdominal pain and imaging evidence of diverticulitis with a microperforation   - afebrile, WBC 8.0  - Abd exam improving, no peritonitis  -  advance to FLD, then advance as tolerated to soft.  - no emergent surgical needs   FEN: FLD, ADAT SOFT.  IVF per primary ID: Zosyn VTE: hep gtt  for history of a fib    LOS: 1 day   I reviewed nursing notes, hospitalist notes, last 24 h vitals and pain scores, last 48 h intake and output, last 24 h labs and trends, and last 24 h imaging results.  This care required straight-forward level of medical decision making.   Obie Dredge, PA-C [facility] Please see Amion for pager number during day hours 7:00am-4:30pm

## 2022-02-26 NOTE — Progress Notes (Signed)
ANTICOAGULATION CONSULT NOTE - Follow Up Consult  Pharmacy Consult for Heparin Indication: atrial fibrillation  Allergies  Allergen Reactions   Codeine Other (See Comments)    Passed out   Colchicine Diarrhea    Patient Measurements:    Vital Signs: Temp: 98.3 F (36.8 C) (02/24 0514) BP: 119/72 (02/24 0514) Pulse Rate: 73 (02/24 0514)  Labs: Recent Labs    02/23/22 1153 02/24/22 0540 02/24/22 0617 02/24/22 0617 02/24/22 1950 02/25/22 0701 02/26/22 0642  HGB 11.6* 10.8*  --   --   --   --  9.9*  HCT 36.1 33.0*  --   --   --   --  31.1*  PLT 317.0 277  --   --   --   --  258  APTT  --   --  48*   < > 81* 74* 114*  LABPROT  --   --  13.7  --   --   --   --   INR  --   --  1.1  --   --   --   --   HEPARINUNFRC  --   --  >1.10*  --   --  0.69 0.46  CREATININE 0.99 1.13*  --   --   --  1.29* 1.37*  CKTOTAL  --   --   --   --   --  45  --    < > = values in this interval not displayed.    Estimated Creatinine Clearance: 22.2 mL/min (A) (by C-G formula based on SCr of 1.37 mg/dL (H)).   Medications:  Infusions:   heparin 650 Units/hr (02/25/22 2313)   piperacillin-tazobactam (ZOSYN)  IV 3.375 g (02/26/22 0547)    Assessment: 89 y.o. female admitted with diverticulitis, h/o Afib on Eliquis. Eliquis on hold and will continue heparin. Per med rec, last dose of Eliquis 2/20 AM. APTT and heparin levels correlating. Will transition to dosing heparin using heparin levels. Heparin level today is therapeutic at 0.46 on 650 units/hr. Hgb 9.9 and platelets 258. No line issues or signs/symptoms of bleeding noted.  Goal of Therapy:  Heparin level 0.3-0.7 units/ml aPTT 66-102 seconds Monitor platelets by anticoagulation protocol: Yes   Plan:  Continue heparin 650 units/hr. Daily CBC, heparin level. Monitor for signs/symptoms of bleeding.  Jeneen Rinks, Pharm.D PGY1 Pharmacy Resident 02/26/2022 10:28 AM

## 2022-02-27 DIAGNOSIS — K5792 Diverticulitis of intestine, part unspecified, without perforation or abscess without bleeding: Secondary | ICD-10-CM | POA: Diagnosis not present

## 2022-02-27 DIAGNOSIS — N179 Acute kidney failure, unspecified: Secondary | ICD-10-CM | POA: Diagnosis not present

## 2022-02-27 DIAGNOSIS — I480 Paroxysmal atrial fibrillation: Secondary | ICD-10-CM | POA: Diagnosis not present

## 2022-02-27 LAB — CBC
HCT: 30 % — ABNORMAL LOW (ref 36.0–46.0)
Hemoglobin: 9.6 g/dL — ABNORMAL LOW (ref 12.0–15.0)
MCH: 29.4 pg (ref 26.0–34.0)
MCHC: 32 g/dL (ref 30.0–36.0)
MCV: 91.7 fL (ref 80.0–100.0)
Platelets: 244 10*3/uL (ref 150–400)
RBC: 3.27 MIL/uL — ABNORMAL LOW (ref 3.87–5.11)
RDW: 13.4 % (ref 11.5–15.5)
WBC: 5.9 10*3/uL (ref 4.0–10.5)
nRBC: 0 % (ref 0.0–0.2)

## 2022-02-27 LAB — BASIC METABOLIC PANEL
Anion gap: 15 (ref 5–15)
BUN: 16 mg/dL (ref 8–23)
CO2: 21 mmol/L — ABNORMAL LOW (ref 22–32)
Calcium: 8.4 mg/dL — ABNORMAL LOW (ref 8.9–10.3)
Chloride: 102 mmol/L (ref 98–111)
Creatinine, Ser: 1.08 mg/dL — ABNORMAL HIGH (ref 0.44–1.00)
GFR, Estimated: 48 mL/min — ABNORMAL LOW (ref >60)
Glucose, Bld: 82 mg/dL (ref 70–99)
Potassium: 3.8 mmol/L (ref 3.5–5.1)
Sodium: 138 mmol/L (ref 135–145)

## 2022-02-27 LAB — HEPARIN LEVEL (UNFRACTIONATED): Heparin Unfractionated: 0.37 IU/mL (ref 0.30–0.70)

## 2022-02-27 MED ORDER — APIXABAN 2.5 MG PO TABS
2.5000 mg | ORAL_TABLET | Freq: Two times a day (BID) | ORAL | Status: DC
  Administered 2022-02-27 – 2022-02-28 (×2): 2.5 mg via ORAL
  Filled 2022-02-27 (×2): qty 1

## 2022-02-27 MED ORDER — AMOXICILLIN-POT CLAVULANATE 400-57 MG/5ML PO SUSR
800.0000 mg | Freq: Two times a day (BID) | ORAL | Status: DC
  Administered 2022-02-27 (×2): 800 mg via ORAL
  Filled 2022-02-27 (×4): qty 10

## 2022-02-27 NOTE — Progress Notes (Signed)
PROGRESS NOTE  Julie Silva [id number] DOB: 1927/10/24   PCP: Janith Lima, MD  Patient is from: Home  DOA: 02/23/2022 LOS: 2   Brief Narrative / Interim history: 89 year old F with PMH of paroxysmal A-fib on Eliquis, SSS with PPM, bronchiectasis/MAI, diverticulosis, CAD, pseudogout, recurrent UTI and urinary retention in the setting of pelvic floor weakness/dysfunction with self-catheterization presented to PCP with LLQ pain, nausea, vomiting and diarrhea.  She had leukocytosis.  CT abdomen and pelvis concerning for diverticulitis with microperforation, and she was directed to the hospital for further care.  Started on IV Zosyn.  General surgery consulted. Patient also has cramping right hip pain.  No history of fall or trauma.  X-ray without acute finding.  Subjective: Patient lying on the bed.  States that she had a very good day yesterday.  Slept well last night.  Having bowel movements.  No further abdominal pain.  No nausea or vomiting.  Her daughter is at the bedside.  Tolerating her soft diet.  Objective: Vitals:   02/26/22 2216 02/27/22 0607 02/27/22 0745 02/27/22 0907  BP: (!) 131/45 (!) 144/66 (!) 140/69   Pulse: 71 76 73 73  Resp: '17 17 18 18  '$ Temp: 98.3 F (36.8 C) 98.2 F (36.8 C) 98.1 F (36.7 C)   TempSrc: Oral Oral Oral   SpO2: 100% 98% 97% 97%    Examination:  General appearance: Awake alert.  In no distress Resp: Clear to auscultation bilaterally.  Normal effort Cardio: S1-S2 is normal regular.  No S3-S4.  No rubs murmurs or bruit GI: Abdomen is soft.  Nontender nondistended.  Bowel sounds are present normal.  No masses organomegaly Extremities: No edema.  Full range of motion of lower extremities.   Assessment and plan:  Acute diverticulitis with microperforation CT with evidence for diverticulitis with microperforation Patient started on Zosyn.  General surgery was consulted.   Patient is improving.  Remains afebrile.  WBC is normal.   Tolerating soft diet.  Cleared by general surgery.  Will first transition her to oral medications today.  If she tolerates them well without difficulty then she could potentially go home tomorrow.  Changed to Augmentin.  Patient requesting liquid formulation.  Hopefully will have that in formulary in the [place].     Paroxysmal A-fib Rate controlled.  Prior to admission she was on Eliquis for anticoagulation.  Not on rate or rhythm control at home. Eliquis was held in case patient required surgery.  She was transitioned to heparin.  Now that she is improving and does not appear to require surgical intervention we will transition back to Eliquis today.  AKI on CKD-3A/normal anion gap metabolic acidosis Rising creatinine was noted yesterday peaking at 1.37.  She was given IV fluids with improvement in renal function today.  She does self catheterize herself as discussed below. Bicarbonate level has improved.  Continue sodium bicarbonate.    Pelvic floor dysfunction/urinary retention/recurrent UTI Patient does self-catheterization.  CT abdomen and pelvis with stable changes of vaginal pexy with gas and fluid in the vagina extending up to the presacral region with some stranding. -Self caths as needed -Hold home prophylactic cefdinir while on antibiotics for the diverticulitis.   Left hip pain Exam was suspicious for left trochanteric bursitis.  No fall or trauma.  X-ray without significant finding.  Pain improving. Offered orthopedic surgery consult for steroid injection but daughter wants to hold off this for now Pain control with as needed Tylenol, oxycodone and IV morphine. Continue  gabapentin Stable for the most part.  Hypokalemia Supplemented.  Magnesium was 2.0.    Normocytic anemia Likely chronic.  Anemia panel within normal. No evidence of overt bleeding.  SSS s/p PPM Followed at [facility].  EKG with sinus rhythm.  Bronchiectasis, underlying MAI -Continue mucolytic's, nebs,  pulmonary toilet and hypertonic saline Abnormal right lower lobe findings noted on CT scan.  Seems to be stable compared to CT chest in December 2023.  Will defer to outpatient providers.   Pseudogout involving multiple joints Stable.  Stable cystic lesion of pancreas Outpatient follow-up.  Patient and daughter were notified of these findings by previous attending.   DVT prophylaxis: On full dose anticoagulation for A-fib. Code Status: Full code Family Communication: Updated patient's daughter at bedside Disposition: Transition to oral antibiotics today.  Resume Eliquis.  Hopefully discharge tomorrow if she remains stable.   Consultants:  General surgery   Sch Meds:  Scheduled Meds:  dorzolamide-timolol  1 drop Both Eyes BID   dronabinol  5 mg Oral BID AC   feeding supplement  237 mL Oral BID BM   gabapentin  300 mg Oral BID   guaiFENesin  10 mL Oral TID   latanoprost  1 drop Both Eyes QHS   pantoprazole  40 mg Oral Daily   sodium bicarbonate  650 mg Oral TID   sodium chloride HYPERTONIC  4 mL Nebulization BID   Continuous Infusions:  sodium chloride Stopped (02/26/22 1319)   heparin 650 Units/hr (02/26/22 1509)   piperacillin-tazobactam (ZOSYN)  IV 3.375 g (02/27/22 0551)   PRN Meds:.acetaminophen **OR** [DISCONTINUED] acetaminophen, albuterol, hydrALAZINE, ipratropium-albuterol, morphine injection, oxyCODONE, prochlorperazine, senna-docusate  Antimicrobials: Anti-infectives (From admission, onward)    Start     Dose/Rate Route Frequency Ordered Stop   02/24/22 1000  piperacillin-tazobactam (ZOSYN) IVPB 3.375 g        3.375 g 12.5 mL/hr over 240 Minutes Intravenous Every 8 hours 02/24/22 0457     02/24/22 0115  piperacillin-tazobactam (ZOSYN) IVPB 3.375 g        3.375 g 100 mL/hr over 30 Minutes Intravenous  Once 02/24/22 0102 02/24/22 0152       CBC: Recent Labs  Lab 02/23/22 1153 02/24/22 0540 02/26/22 0642 02/27/22 0450  WBC 14.2* 10.5 8.0 5.9   NEUTROABS 11.2*  --   --   --   HGB 11.6* 10.8* 9.9* 9.6*  HCT 36.1 33.0* 31.1* 30.0*  MCV 91.6 90.7 91.7 91.7  PLT 317.0 277 258 244    BMP &GFR Recent Labs  Lab 02/23/22 1153 02/24/22 0540 02/25/22 0701 02/26/22 0642 02/27/22 0450  NA 137 135 141 138 138  K 5.0 3.6 3.9 3.3* 3.8  CL 102 102 107 104 102  CO2 26 19* 15* 19* 21*  GLUCOSE 112* 100* 65* 75 82  BUN 40* 30* 31* 25* 16  CREATININE 0.99 1.13* 1.29* 1.37* 1.08*  CALCIUM 9.6 8.6* 8.4* 8.2* 8.4*  MG  --  2.1 2.3 2.0  --   PHOS  --   --  4.0 3.1  --     Estimated Creatinine Clearance: 28.1 mL/min (A) (by C-G formula based on SCr of 1.08 mg/dL (H)).  Liver & Pancreas: Recent Labs  Lab 02/23/22 1153 02/24/22 0540 02/25/22 0701 02/26/22 0642  AST 18 19  --   --   ALT 14 16  --   --   ALKPHOS 93 95  --   --   BILITOT 0.5 0.9  --   --  PROT 7.7 6.9  --   --   ALBUMIN 3.7 2.7* 2.5* 2.6*    Recent Labs  Lab 02/23/22 1153  LIPASE 37.0    Cardiac Enzymes: Recent Labs  Lab 02/25/22 0701  CKTOTAL 45    Recent Labs    03/30/21 1446 06/25/21 1536  PROBNP 162.0* 138.0*    Coagulation Profile: Recent Labs  Lab 02/24/22 0617  INR 1.1    Anemia Panel: Recent Labs    02/25/22 0631 02/25/22 0701  VITAMINB12  --  1,154*  FOLATE >40.0  --   FERRITIN  --  161  TIBC  --  293  IRON  --  53     Microbiology: Recent Results (from the past 240 hour(s))  Blood culture (routine x 2)     Status: None (Preliminary result)   Collection Time: 02/23/22  5:36 PM   Specimen: BLOOD  Result Value Ref Range Status   Specimen Description BLOOD RIGHT ANTECUBITAL  Final   Special Requests   Final    BOTTLES DRAWN AEROBIC AND ANAEROBIC Blood Culture results may not be optimal due to an inadequate volume of blood received in culture bottles   Culture   Final    NO GROWTH 4 DAYS Performed at [facility] [address] [place], [place] [address]    Report Status PENDING  Incomplete  Blood culture  (routine x 2)     Status: None (Preliminary result)   Collection Time: 02/24/22  1:20 AM   Specimen: BLOOD  Result Value Ref Range Status   Specimen Description BLOOD RIGHT ANTECUBITAL  Final   Special Requests   Final    BOTTLES DRAWN AEROBIC AND ANAEROBIC Blood Culture results may not be optimal due to an inadequate volume of blood received in culture bottles   Culture   Final    NO GROWTH 3 DAYS Performed at [facility], [place] [address], [place],  [address]    Report Status PENDING  Incomplete    Radiology Studies: No results found.   Chili  [facility]  If 7PM-7AM, please contact night-coverage [email or web address] 02/27/2022, 9:53 AM

## 2022-02-27 NOTE — Progress Notes (Signed)
Pharmacy Antibiotic Note  Julie Silva is a 89 y.o. female admitted on 02/23/2022 with  intra-abdominal infection .  Pharmacy has been consulted for Zosyn dosing.  SCr 1.08. CrCl 28 ml/min. WBC 5.9 and afebrile. Imaging from 2/21 shows diverticulitis with microperforation.   Plan: Continue Zosyn 3.375G IV q8h to be infused over 4 hours  Follow for length of therapy    Temp (24hrs), Avg:97.9 F (36.6 C), Min:97.2 F (36.2 C), Max:98.3 F (36.8 C)  Recent Labs  Lab 02/23/22 1153 02/23/22 1745 02/24/22 0540 02/25/22 0701 02/26/22 0642 02/27/22 0450  WBC 14.2*  --  10.5  --  8.0 5.9  CREATININE 0.99  --  1.13* 1.29* 1.37* 1.08*  LATICACIDVEN  --  1.4  --   --   --   --     Estimated Creatinine Clearance: 28.1 mL/min (A) (by C-G formula based on SCr of 1.08 mg/dL (H)).    Allergies  Allergen Reactions   Codeine Other (See Comments)    Passed out   Colchicine Diarrhea    Antimicrobials this admission: Zosyn 2/22 >>    Dose adjustments this admission:  Microbiology results: 2/21 BCx: No growth 2/22 BCx: No growth   Thank you for allowing pharmacy to be a part of this patient's care.  Jeneen Rinks, Pharm.D PGY1 Pharmacy Resident 02/27/2022 7:33 AM

## 2022-02-27 NOTE — Progress Notes (Signed)
ANTICOAGULATION CONSULT NOTE - Follow Up Consult  Pharmacy Consult for Heparin Indication: atrial fibrillation  Allergies  Allergen Reactions   Codeine Other (See Comments)    Passed out   Colchicine Diarrhea    Patient Measurements:    Vital Signs: Temp: 98.2 F (36.8 C) (02/25 0607) Temp Source: Oral (02/25 0607) BP: 144/66 (02/25 0607) Pulse Rate: 76 (02/25 0607)  Labs: Recent Labs    02/24/22 1950 02/25/22 0701 02/26/22 0642 02/27/22 0450  HGB  --   --  9.9* 9.6*  HCT  --   --  31.1* 30.0*  PLT  --   --  258 244  APTT 81* 74* 114*  --   HEPARINUNFRC  --  0.69 0.46 0.37  CREATININE  --  1.29* 1.37* 1.08*  CKTOTAL  --  45  --   --     Estimated Creatinine Clearance: 28.1 mL/min (A) (by C-G formula based on SCr of 1.08 mg/dL (H)).  Medications:  Infusions:   sodium chloride Stopped (02/26/22 1319)   heparin 650 Units/hr (02/26/22 1509)   piperacillin-tazobactam (ZOSYN)  IV 3.375 g (02/27/22 0551)   Assessment: 89 y.o. female admitted with diverticulitis, h/o Afib on Eliquis. Eliquis on hold and will continue heparin. Per med rec, last dose of Eliquis 2/20 AM. APTT and heparin levels correlating. Will transition to dosing heparin using heparin levels. Heparin level today is therapeutic at 0.37 on 650 units/hr. Hgb 9.6 and platelets 244. No line issues or signs/symptoms of bleeding noted.  Goal of Therapy:  Heparin level 0.3-0.7 units/ml aPTT 66-102 seconds Monitor platelets by anticoagulation protocol: Yes   Plan:  Continue heparin 650 units/hr. Daily CBC, heparin level. Monitor for signs/symptoms of bleeding.  Jeneen Rinks, Pharm.D PGY1 Pharmacy Resident 02/27/2022 7:22 AM

## 2022-02-27 NOTE — Progress Notes (Signed)
Mobility Specialist - Progress Note   02/27/22 0951  Mobility  Activity Ambulated with assistance in hallway  Level of Assistance Contact guard assist, steadying assist  Assistive Device Front wheel walker  Distance Ambulated (ft) 140 ft  Activity Response Tolerated well  Mobility Referral Yes  $Mobility charge 1 Mobility   Pt was received in bed and agreeable to mobility. Pt with no complaints this session. Pt was returned to bed with all needs met.  Franki Monte  Mobility Specialist Please contact via Solicitor or Rehab office at [phone]

## 2022-02-27 NOTE — Progress Notes (Signed)
[facility] Progress Note     Subjective: CC:  Reports feeling much better- denies abd pain. Feels BMs are slowing down. Tolerating PO - eating small snacks which is her baseline. Denies nausea or vomiting.  Pts daughter at bedside.   Objective: Vital signs in last 24 hours: Temp:  [97.2 F (36.2 C)-98.3 F (36.8 C)] 98.1 F (36.7 C) (02/25 0745) Pulse Rate:  [71-83] 73 (02/25 0907) Resp:  [16-18] 18 (02/25 0907) BP: (131-144)/(45-69) 140/69 (02/25 0745) SpO2:  [97 %-100 %] 97 % (02/25 0907) Last BM Date : 02/26/22  Intake/Output from previous day: 02/24 0701 - 02/25 0700 In: 508.2 [I.V.:258.2; IV Piggyback:250] Out: -  Intake/Output this shift: No intake/output data recorded.  PE: Gen:  Alert, NAD, pleasant Card:  Regular rate and rhythm, pedal pulses 2+ BL Pulm:  Normal effort, clear to auscultation bilaterally Abd: Soft, mild distention, +BS non-tender to palpation, no hernias. Skin: warm and dry, no rashes  Psych: A&Ox3   Lab Results:  Recent Labs    02/26/22 0642 02/27/22 0450  WBC 8.0 5.9  HGB 9.9* 9.6*  HCT 31.1* 30.0*  PLT 258 244   BMET Recent Labs    02/26/22 0642 02/27/22 0450  NA 138 138  K 3.3* 3.8  CL 104 102  CO2 19* 21*  GLUCOSE 75 82  BUN 25* 16  CREATININE 1.37* 1.08*  CALCIUM 8.2* 8.4*   PT/INR No results for input(s): "LABPROT", "INR" in the last 72 hours.  CMP     Component Value Date/Time   NA 138 02/27/2022 0450   NA 142 02/23/2018 1525   K 3.8 02/27/2022 0450   CL 102 02/27/2022 0450   CO2 21 (L) 02/27/2022 0450   GLUCOSE 82 02/27/2022 0450   BUN 16 02/27/2022 0450   BUN 17 02/23/2018 1525   CREATININE 1.08 (H) 02/27/2022 0450   CALCIUM 8.4 (L) 02/27/2022 0450   PROT 6.9 02/24/2022 0540   ALBUMIN 2.6 (L) 02/26/2022 0642   AST 19 02/24/2022 0540   ALT 16 02/24/2022 0540   ALKPHOS 95 02/24/2022 0540   BILITOT 0.9 02/24/2022 0540   GFRNONAA 48 (L) 02/27/2022 0450   GFRAA 70 02/23/2018 1525   Lipase      Component Value Date/Time   LIPASE 37.0 02/23/2022 1153       Studies/Results: No results found.  Anti-infectives: Anti-infectives (From admission, onward)    Start     Dose/Rate Route Frequency Ordered Stop   02/24/22 1000  piperacillin-tazobactam (ZOSYN) IVPB 3.375 g        3.375 g 12.5 mL/hr over 240 Minutes Intravenous Every 8 hours 02/24/22 0457     02/24/22 0115  piperacillin-tazobactam (ZOSYN) IVPB 3.375 g        3.375 g 100 mL/hr over 30 Minutes Intravenous  Once 02/24/22 0102 02/24/22 0152        Assessment/Plan  89 yo female presenting with acute LLQ abdominal pain and imaging evidence of diverticulitis with a microperforation   - afebrile, WBC  WNL - Abd exam improving, no peritonitis  -  continue SOFT diet - no emergent surgical needs. From a surgery perspective she is stable for discharge. Would complete a total of 10-14 days augmentin (per daughter patient has trouble swallowing large pills so would prefer a liquid or crushed med). Typically we recommend colonoscopy to r/o malignancy but, given her age, unclear if this would be indicated. Will discuss with MD.  FEN: SOFt, protein shakes ID: Zosyn VTE: hep  gtt for history of a fib; to place back on DOAC if recommended by medicine    LOS: 2 days   I reviewed nursing notes, hospitalist notes, last 24 h vitals and pain scores, last 48 h intake and output, last 24 h labs and trends, and last 24 h imaging results.  This care required straight-forward level of medical decision making.   Obie Dredge, PA-C [facility] Please see Amion for pager number during day hours 7:00am-4:30pm

## 2022-02-28 ENCOUNTER — Other Ambulatory Visit (HOSPITAL_COMMUNITY): Payer: Self-pay

## 2022-02-28 ENCOUNTER — Encounter: Payer: Self-pay | Admitting: Oncology

## 2022-02-28 DIAGNOSIS — K5792 Diverticulitis of intestine, part unspecified, without perforation or abscess without bleeding: Secondary | ICD-10-CM | POA: Diagnosis not present

## 2022-02-28 LAB — BASIC METABOLIC PANEL
Anion gap: 12 (ref 5–15)
BUN: 8 mg/dL (ref 8–23)
CO2: 22 mmol/L (ref 22–32)
Calcium: 8.4 mg/dL — ABNORMAL LOW (ref 8.9–10.3)
Chloride: 104 mmol/L (ref 98–111)
Creatinine, Ser: 0.96 mg/dL (ref 0.44–1.00)
GFR, Estimated: 56 mL/min — ABNORMAL LOW (ref >60)
Glucose, Bld: 97 mg/dL (ref 70–99)
Potassium: 3.5 mmol/L (ref 3.5–5.1)
Sodium: 138 mmol/L (ref 135–145)

## 2022-02-28 LAB — CBC
HCT: 31.5 % — ABNORMAL LOW (ref 36.0–46.0)
Hemoglobin: 9.9 g/dL — ABNORMAL LOW (ref 12.0–15.0)
MCH: 29.2 pg (ref 26.0–34.0)
MCHC: 31.4 g/dL (ref 30.0–36.0)
MCV: 92.9 fL (ref 80.0–100.0)
Platelets: 247 10*3/uL (ref 150–400)
RBC: 3.39 MIL/uL — ABNORMAL LOW (ref 3.87–5.11)
RDW: 13.4 % (ref 11.5–15.5)
WBC: 7 10*3/uL (ref 4.0–10.5)
nRBC: 0 % (ref 0.0–0.2)

## 2022-02-28 LAB — CULTURE, BLOOD (ROUTINE X 2): Culture: NO GROWTH

## 2022-02-28 MED ORDER — AMOXICILLIN-POT CLAVULANATE 400-57 MG/5ML PO SUSR
800.0000 mg | Freq: Two times a day (BID) | ORAL | 0 refills | Status: AC
  Filled 2022-02-28: qty 200, 10d supply, fill #0

## 2022-02-28 MED ORDER — SODIUM BICARBONATE 650 MG PO TABS
650.0000 mg | ORAL_TABLET | Freq: Three times a day (TID) | ORAL | 0 refills | Status: AC
  Filled 2022-02-28: qty 6, 2d supply, fill #0

## 2022-02-28 MED ORDER — ONDANSETRON 4 MG PO TBDP
4.0000 mg | ORAL_TABLET | Freq: Three times a day (TID) | ORAL | 0 refills | Status: AC | PRN
  Filled 2022-02-28: qty 30, 10d supply, fill #0

## 2022-02-28 NOTE — Progress Notes (Signed)
[facility] Progress Note     Subjective: CC:  Reports feeling much better than when admitted, and now denies any abd pain. Feels BMs have slowed down. Tolerating PO - eating small snacks which is her baseline. Denies nausea or vomiting.  Pts daughter at bedside (one of her 4)  Objective: Vital signs in last 24 hours: Temp:  [98.1 F (36.7 C)-98.2 F (36.8 C)] 98.1 F (36.7 C) (02/26 0808) Pulse Rate:  [73-81] 81 (02/26 0847) Resp:  [15-18] 18 (02/26 0847) BP: (146-153)/(53-62) 146/62 (02/26 0808) SpO2:  [94 %-98 %] 94 % (02/26 0847) Last BM Date : 02/27/22  Intake/Output from previous day: No intake/output data recorded. Intake/Output this shift: No intake/output data recorded.  PE: Gen:  Alert, NAD, pleasant Pulm:  Normal effort Abd: Soft, nontender, nondistended Skin: warm and dry  Lab Results:  Recent Labs    02/27/22 0450 02/28/22 0739  WBC 5.9 7.0  HGB 9.6* 9.9*  HCT 30.0* 31.5*  PLT 244 247   BMET Recent Labs    02/27/22 0450 02/28/22 0739  NA 138 138  K 3.8 3.5  CL 102 104  CO2 21* 22  GLUCOSE 82 97  BUN 16 8  CREATININE 1.08* 0.96  CALCIUM 8.4* 8.4*   PT/INR No results for input(s): "LABPROT", "INR" in the last 72 hours.  CMP     Component Value Date/Time   NA 138 02/28/2022 0739   NA 142 02/23/2018 1525   K 3.5 02/28/2022 0739   CL 104 02/28/2022 0739   CO2 22 02/28/2022 0739   GLUCOSE 97 02/28/2022 0739   BUN 8 02/28/2022 0739   BUN 17 02/23/2018 1525   CREATININE 0.96 02/28/2022 0739   CALCIUM 8.4 (L) 02/28/2022 0739   PROT 6.9 02/24/2022 0540   ALBUMIN 2.6 (L) 02/26/2022 0642   AST 19 02/24/2022 0540   ALT 16 02/24/2022 0540   ALKPHOS 95 02/24/2022 0540   BILITOT 0.9 02/24/2022 0540   GFRNONAA 56 (L) 02/28/2022 0739   GFRAA 70 02/23/2018 1525   Lipase     Component Value Date/Time   LIPASE 37.0 02/23/2022 1153       Studies/Results: No results found.  Anti-infectives: Anti-infectives (From  admission, onward)    Start     Dose/Rate Route Frequency Ordered Stop   02/28/22 0000  amoxicillin-clavulanate (AUGMENTIN) 400-57 MG/5ML suspension        800 mg Oral Every 12 hours 02/28/22 0739 03/10/22 2359   02/27/22 1200  amoxicillin-clavulanate (AUGMENTIN) 400-57 MG/5ML suspension 800 mg       Note to Pharmacy: Patient requesting liquid formulation due to dysphagia   800 mg Oral Every 12 hours 02/27/22 0958     02/24/22 1000  piperacillin-tazobactam (ZOSYN) IVPB 3.375 g  Status:  Discontinued        3.375 g 12.5 mL/hr over 240 Minutes Intravenous Every 8 hours 02/24/22 0457 02/27/22 0958   02/24/22 0115  piperacillin-tazobactam (ZOSYN) IVPB 3.375 g        3.375 g 100 mL/hr over 30 Minutes Intravenous  Once 02/24/22 0102 02/24/22 0152        Assessment/Plan  89 yo female presenting with acute LLQ abdominal pain and imaging evidence of diverticulitis with a microperforation   - afebrile, WBC  WNL - Abdominal exam benign, reassuring  -  continue SOFT diet - From a surgery perspective she is stable for discharge. Would complete a total of 10-14 days augmentin (per daughter patient has trouble swallowing large pills  so would prefer a liquid or crushed med). Would recommend colonoscopy to r/o malignancy as outpatient (~6-8 wks out)  FEN: SOFt, protein shakes ID: Zosyn VTE: hep gtt for history of a fib; to place back on DOAC if recommended by medicine  Our service will remain available if questions/concerns arise-please reach out to [place].    LOS: 3 days   I reviewed nursing notes, hospitalist notes, last 24 h vitals and pain scores, last 48 h intake and output, last 24 h labs and trends, and last 24 h imaging results.  I spent a total of 35 minutes in both face-to-face and non-face-to-face activities, excluding procedures performed, for this visit on the date of this encounter.  Check [email or web address] for General Surgery coverage night/weekend/holidays  Page if acute issues. No secure  chat available for me given surgeries/clinic/off post call which would lead to a delay in care.  Nadeen Landau, MD [facility], [facility]

## 2022-02-28 NOTE — Discharge Summary (Signed)
Physician Discharge Summary  Julie Silva [id number] DOB: [birth year] DOA: 02/23/2022  PCP: Janith Lima, MD  Admit date: 02/23/2022 Discharge date: 02/28/2022  Admitted From: Home Disposition: Home  Recommendations for Outpatient Follow-up:  Follow up with PCP in 1-2 weeks Ple. ase obtain BMP/CBC in one week your next doctors visit.  Patient has been given about 10 days of oral Augmentin to complete total 14-day course.   Discharge Condition: Stable CODE STATUS: Full code Diet recommendation: Slowly advance diet as tolerated at home  Brief/Interim Summary: 89 year old F with PMH of paroxysmal A-fib on Eliquis, SSS with PPM, bronchiectasis/MAI, diverticulosis, CAD, pseudogout, recurrent UTI and urinary retention in the setting of pelvic floor weakness/dysfunction with self-catheterization presented to PCP with LLQ pain, nausea, vomiting and diarrhea.  She had leukocytosis.  CT abdomen and pelvis concerning for diverticulitis with microperforation, and she was directed to the hospital for further care.  Started on IV Zosyn.  General surgery consulted. Patient also has cramping right hip pain.  No history of fall or trauma.  X-ray without acute finding.   Acute diverticulitis with microperforation CT with evidence for diverticulitis with microperforation Patient started on Zosyn.  General surgery was consulted.  Recommended conservative management, tolerating orals today.  Will transition to p.o. Augmentin to complete total 2-week course.   Paroxysmal A-fib Rate controlled.  Resume home Eliquis   AKI on CKD-3A/normal anion gap metabolic acidosis Creatinine appears to be stable at baseline of 0.9.  Can repeat blood work with PCP next 1-2 weeks   Pelvic floor dysfunction/urinary retention/recurrent UTI Patient does self-catheterization.  Home prophylactic cefdinir can eventually be restarted, discussed with PCP   Left hip pain Follow-up outpatient orthopedic.  Suspect trochanter  bursitis   Hypokalemia Stable   Normocytic anemia Likely chronic.  Anemia panel within normal. No evidence of overt bleeding.   SSS s/p PPM Followed at [facility].  EKG with sinus rhythm.   Bronchiectasis, underlying MAI -Continue mucolytic's, nebs, pulmonary toilet and hypertonic saline Abnormal right lower lobe findings noted on CT scan.  Seems to be stable compared to CT chest in December 2023.  Will defer to outpatient providers.   Pseudogout involving multiple joints Stable.   Stable cystic lesion of pancreas Outpatient follow-up.  Patient and daughter were notified of these findings by previous attending.        Discharge Diagnoses:  Principal Problem:   Acute diverticulitis Active Problems:   AKI (acute kidney injury) ([place])   Hyperlipidemia with target LDL less than 130   Dysphagia   Second degree AV block   Pseudogout involving multiple joints   Bronchiectasis (HCC)   Paroxysmal atrial fibrillation (HCC)   Chronic respiratory failure with hypoxia (HCC)   Chronic kidney disease, stage 3a ([place])   Acute diverticulitis of intestine      Consultations: General surgery  Subjective: Feels great, tolerating oral.  Remains afebrile.  Wishes to go home Daughter at bedside  Discharge Exam: Vitals:   02/28/22 0808 02/28/22 0847  BP: (!) 146/62   Pulse: 73 81  Resp: 15 18  Temp: 98.1 F (36.7 C)   SpO2: 97% 94%   Vitals:   02/27/22 2103 02/28/22 0515 02/28/22 0808 02/28/22 0847  BP: (!) 153/53 (!) 152/61 (!) 146/62   Pulse: 80 74 73 81  Resp: '17 17 15 18  '$ Temp: 98.2 F (36.8 C) 98.1 F (36.7 C) 98.1 F (36.7 C)   TempSrc: Oral Oral Oral   SpO2: 98% 97% 97% 94%  General: Pt is alert, awake, not in acute distress Cardiovascular: RRR, S1/S2 +, no rubs, no gallops Respiratory: CTA bilaterally, no wheezing, no rhonchi Abdominal: Soft, NT, ND, bowel sounds + Extremities: no edema, no cyanosis  Discharge Instructions   Allergies as of 02/28/2022        Reactions   Codeine Other (See Comments)   Passed out   Colchicine Diarrhea        Medication List     STOP taking these medications    cefdinir 250 MG/5ML suspension Commonly known as: OMNICEF       TAKE these medications    acetaminophen 500 MG tablet Commonly known as: TYLENOL Take 500 mg by mouth 2 (two) times daily.   amoxicillin-clavulanate 400-57 MG/5ML suspension Commonly known as: AUGMENTIN Take 10 mLs (800 mg total) by mouth every 12 (twelve) hours for 10 days.   BENEFIBER PO Take 5 mLs by mouth 2 (two) times daily.   bimatoprost 0.01 % Soln Commonly known as: LUMIGAN Place 1 drop into both eyes at bedtime.   Catheters Misc Replace catheter twice daily.   dorzolamide-timolol 2-0.5 % ophthalmic solution Commonly known as: COSOPT Place 1 drop into both eyes 2 (two) times daily.   dronabinol 5 MG capsule Commonly known as: MARINOL Take 5 mg by mouth 2 (two) times daily before a meal. What changed: Another medication with the same name was removed. Continue taking this medication, and follow the directions you see here.   Eliquis 2.5 MG Tabs tablet Generic drug: apixaban TAKE 1 TABLET TWICE A DAY What changed: how much to take   estradiol 0.1 MG/GM vaginal cream Commonly known as: ESTRACE Place 1 Applicatorful vaginally every other day.   gabapentin 100 MG capsule Commonly known as: NEURONTIN Take 300 mg by mouth 2 (two) times daily.   guaifenesin 100 MG/5ML syrup Commonly known as: ROBITUSSIN Take 100 mg by mouth 3 (three) times daily.   Melatonin 1 MG Chew Chew 1 tablet by mouth as needed (sleep).   omeprazole 20 MG capsule Commonly known as: PRILOSEC TAKE 1 CAPSULE BY MOUTH EVERY DAY What changed:  how much to take additional instructions   ondansetron 4 MG disintegrating tablet Commonly known as: ZOFRAN-ODT Take 1 tablet (4 mg total) by mouth every 8 (eight) hours as needed for nausea or vomiting.   prochlorperazine 10 MG  tablet Commonly known as: COMPAZINE Take 10 mg by mouth every 4 (four) hours as needed for nausea or vomiting.   sodium bicarbonate 650 MG tablet Take 1 tablet (650 mg total) by mouth 3 (three) times daily for 2 days.   sodium chloride HYPERTONIC 3 % nebulizer solution Take by nebulization 2 (two) times daily as needed for other.        Follow-up Information     Janith Lima, MD Follow up in 1 week(s).   Specialty: Internal Medicine Contact information: [place] [address] [phone]         Janith Lima, MD Follow up in 1 week(s).   Specialty: Internal Medicine Contact information: [place] [address] [phone]                Allergies  Allergen Reactions   Codeine Other (See Comments)    Passed out   Colchicine Diarrhea    You were cared for by a hospitalist during your hospital stay. If you have any questions about your discharge medications or the care you received while you  were in the hospital after you are discharged, you can call the unit and asked to speak with the hospitalist on call if the hospitalist that took care of you is not available. Once you are discharged, your primary care physician will handle any further medical issues. Please note that no refills for any discharge medications will be authorized once you are discharged, as it is imperative that you return to your primary care physician (or establish a relationship with a primary care physician if you do not have one) for your aftercare needs so that they can reassess your need for medications and monitor your lab values.   Procedures/Studies: DG HIP UNILAT WITH PELVIS 2-3 VIEWS LEFT  Result Date: 02/24/2022 CLINICAL DATA:  Left hip pain.  No known trauma. EXAM: DG HIP (WITH OR WITHOUT PELVIS) 2-3V LEFT COMPARISON:  CT abdomen and pelvis 02/23/2022 FINDINGS: There is diffuse decreased bone mineralization. Contrast is seen within the urinary  bladder from IV contrast administered for yesterday's CT. There is also oral contrast seen within the ascending colon and proximal sigmoid colon. High-grade sigmoid diverticulosis is again noted. Vascular phleboliths overlie the pelvis. Mild-to-moderate bilateral femoroacetabular, mild pubic symphysis, and mild bilateral sacroiliac joint space narrowing and subchondral sclerosis. Normal morphology of the left femoral head-neck junction without CAM-type bump deformity. Mild superolateral left acetabular degenerative osteophytosis. No acute fracture or dislocation.  Moderate vascular calcifications. IMPRESSION: 1. No acute fracture. 2. Mild-to-moderate bilateral femoroacetabular osteoarthritis. Electronically Signed   By: Yvonne Kendall M.D.   On: 02/24/2022 12:14   DG Chest Portable 1 View  Result Date: 02/24/2022 CLINICAL DATA:  Cough and abdominal pain EXAM: PORTABLE CHEST 1 VIEW COMPARISON:  10/15/2021 FINDINGS: Cardiac shadow is stable. Lead less pacemaker is again noted and stable. Chronic infiltrate is seen in the right base stable from the prior study. No new focal abnormality is seen. No bony changes are noted. IMPRESSION: Chronic somewhat nodular infiltrate in the right base stable from multiple previous exams. Electronically Signed   By: Inez Catalina M.D.   On: 02/24/2022 01:41   CT Abdomen Pelvis W Contrast  Result Date: 02/23/2022 CLINICAL DATA:  Acute abdominal pain nonlocalized lower abdomen. Diarrhea. Weakness EXAM: CT ABDOMEN AND PELVIS WITH CONTRAST TECHNIQUE: Multidetector CT imaging of the abdomen and pelvis was performed using the standard protocol following bolus administration of intravenous contrast. RADIATION DOSE REDUCTION: This exam was performed according to the departmental dose-optimization program which includes automated exposure control, adjustment of the mA and/or kV according to patient size and/or use of iterative reconstruction technique. CONTRAST:  51m OMNIPAQUE IOHEXOL  300 MG/ML  SOLN COMPARISON:  CT 08/07/2021 and older FINDINGS: Lower chest: As seen on the prior examination there areas of nodularity with opacity along the right lower lobe. Please correlate with prior workup. This would have a differential including aggressive process. This is similar to the recent chest CT of 12/15/2021. Stable leadless pacemaker again noted along the anterior margin of the right ventricle. Hepatobiliary: Previous cholecystectomy. Minimal ectasia of the biliary tree. No space-occupying liver lesion. Pancreas: Small cysts again seen along the midbody of the pancreas inferiorly, unchanged from previous measuring up to 12 mm. These appear slightly smaller today. Spleen: Splenic granulomas. Adrenals/Urinary Tract: Adrenal glands are preserved. Mild bilateral renal atrophy with stable Bosniak 1 bilateral small renal cysts. No specific imaging follow-up. The ureters have normal course and caliber extending down to the bladder. Stomach/Bowel: Colonic diverticulosis identified. There is wall thickening and stranding along the descending colon consistent  with an area of diverticulitis. On sagittal image 90 of series 6 there are some tiny bolus of air immediately adjacent to the wall of the bowel in this location with confluence stranding. This could represent a microperforation. This area of ill-defined fluid on axial image 56 of series 2 measures 2.3 by 1.5 cm. More proximally the colon is nondilated. Small hiatal hernia. The small bowel is nondilated. Vascular/Lymphatic: Normal caliber aorta and IVC. Scattered vascular calcifications identified. There are areas of stenosis suggested along the origin of the right renal artery. Please correlate for any level of hypertension. Reproductive: Uterus itself is absent. No adnexal mass. As seen previously there is again elongation of the vaginal canal extending to the presacral region. Again seen on coronal and sagittal images. Sagittal image 61 of series 6.  History of vaginal pexy. Again the etiology of the luminal gas and fluid within this structure is uncertain. There are some adjacent loops of small bowel and some mild stranding. Other: No abdominal wall hernia or abnormality. No abdominopelvic ascites. Musculoskeletal: Degenerative changes seen of the spine and pelvis. IMPRESSION: Colonic diverticulosis with an area of inflammatory stranding and wall thickening along the mid descending colon consistent with an area of diverticulitis. Few bubbles of air immediately adjacent to the bowel wall posteriorly in this location could represent microperforation with early phlegmonous change. No widespread free air or obstruction. Recommend follow-up to confirm clearance. Stable changes of vaginal pexy with gas and fluid in the vagina extending up to the presacral region with some stranding. Please correlate with any prior workup. Stable cystic lesions along the midbody of the pancreas. In light of the patient's age simple follow up in 2 years could be performed. Masslike opacities in the right lower lobe of the lung as on prior CT of the chest from December 2023. Electronically Signed   By: Jill Side M.D.   On: 02/23/2022 15:19     The results of significant diagnostics from this hospitalization (including imaging, microbiology, ancillary and laboratory) are listed below for reference.     Microbiology: Recent Results (from the past 240 hour(s))  Blood culture (routine x 2)     Status: None   Collection Time: 02/23/22  5:36 PM   Specimen: BLOOD  Result Value Ref Range Status   Specimen Description BLOOD RIGHT ANTECUBITAL  Final   Special Requests   Final    BOTTLES DRAWN AEROBIC AND ANAEROBIC Blood Culture results may not be optimal due to an inadequate volume of blood received in culture bottles   Culture   Final    NO GROWTH 5 DAYS Performed at [facility], [place] [address] [place], [place] [address]    Report Status 02/28/2022 FINAL  Final   Blood culture (routine x 2)     Status: None (Preliminary result)   Collection Time: 02/24/22  1:20 AM   Specimen: BLOOD  Result Value Ref Range Status   Specimen Description BLOOD RIGHT ANTECUBITAL  Final   Special Requests   Final    BOTTLES DRAWN AEROBIC AND ANAEROBIC Blood Culture results may not be optimal due to an inadequate volume of blood received in culture bottles   Culture   Final    NO GROWTH 4 DAYS Performed at [facility], [place] [address] [place], [place] [address]    Report Status PENDING  Incomplete     Labs: BNP (last 3 results) No results for input(s): "BNP" in the last 8760 hours. Basic Metabolic Panel:  Recent Labs  Lab 02/24/22 0540 02/25/22 0701 02/26/22 0642 02/27/22 0450 02/28/22 0739  NA 135 141 138 138 138  K 3.6 3.9 3.3* 3.8 3.5  CL 102 107 104 102 104  CO2 19* 15* 19* 21* 22  GLUCOSE 100* 65* 75 82 97  BUN 30* 31* 25* 16 8  CREATININE 1.13* 1.29* 1.37* 1.08* 0.96  CALCIUM 8.6* 8.4* 8.2* 8.4* 8.4*  MG 2.1 2.3 2.0  --   --   PHOS  --  4.0 3.1  --   --    Liver Function Tests: Recent Labs  Lab 02/23/22 1153 02/24/22 0540 02/25/22 0701 02/26/22 0642  AST 18 19  --   --   ALT 14 16  --   --   ALKPHOS 93 95  --   --   BILITOT 0.5 0.9  --   --   PROT 7.7 6.9  --   --   ALBUMIN 3.7 2.7* 2.5* 2.6*   Recent Labs  Lab 02/23/22 1153  LIPASE 37.0   No results for input(s): "AMMONIA" in the last 168 hours. CBC: Recent Labs  Lab 02/23/22 1153 02/24/22 0540 02/26/22 0642 02/27/22 0450 02/28/22 0739  WBC 14.2* 10.5 8.0 5.9 7.0  NEUTROABS 11.2*  --   --   --   --   HGB 11.6* 10.8* 9.9* 9.6* 9.9*  HCT 36.1 33.0* 31.1* 30.0* 31.5*  MCV 91.6 90.7 91.7 91.7 92.9  PLT 317.0 277 258 244 247   Cardiac Enzymes: Recent Labs  Lab 02/25/22 0701  CKTOTAL 45   BNP: Invalid input(s): "POCBNP" CBG: No results for input(s): "GLUCAP" in the last 168 hours. D-Dimer No results for input(s): "DDIMER" in the last 72 hours. Hgb A1c No  results for input(s): "HGBA1C" in the last 72 hours. Lipid Profile No results for input(s): "CHOL", "HDL", "LDLCALC", "TRIG", "CHOLHDL", "LDLDIRECT" in the last 72 hours. Thyroid function studies No results for input(s): "TSH", "T4TOTAL", "T3FREE", "THYROIDAB" in the last 72 hours.  Invalid input(s): "FREET3" Anemia work up No results for input(s): "VITAMINB12", "FOLATE", "FERRITIN", "TIBC", "IRON", "RETICCTPCT" in the last 72 hours. Urinalysis    Component Value Date/Time   COLORURINE YELLOW 08/17/2021 1315   APPEARANCEUR HAZY (A) 08/17/2021 1315   LABSPEC 1.015 08/17/2021 1315   PHURINE 8.0 08/17/2021 1315   GLUCOSEU NEGATIVE 08/17/2021 1315   GLUCOSEU NEGATIVE 07/12/2021 1700   HGBUR NEGATIVE 08/17/2021 1315   BILIRUBINUR NEGATIVE 08/17/2021 1315   BILIRUBINUR negative 02/26/2021 1132   KETONESUR NEGATIVE 08/17/2021 1315   PROTEINUR NEGATIVE 08/17/2021 1315   UROBILINOGEN 0.2 07/12/2021 1700   NITRITE NEGATIVE 08/17/2021 1315   LEUKOCYTESUR SMALL (A) 08/17/2021 1315   Sepsis Labs Recent Labs  Lab 02/24/22 0540 02/26/22 0642 02/27/22 0450 02/28/22 0739  WBC 10.5 8.0 5.9 7.0   Microbiology Recent Results (from the past 240 hour(s))  Blood culture (routine x 2)     Status: None   Collection Time: 02/23/22  5:36 PM   Specimen: BLOOD  Result Value Ref Range Status   Specimen Description BLOOD RIGHT ANTECUBITAL  Final   Special Requests   Final    BOTTLES DRAWN AEROBIC AND ANAEROBIC Blood Culture results may not be optimal due to an inadequate volume of blood received in culture bottles   Culture   Final    NO GROWTH 5 DAYS Performed at [facility], [place] [address], [place], [place] [address]    Report Status 02/28/2022 FINAL  Final  Blood culture (routine  x 2)     Status: None (Preliminary result)   Collection Time: 02/24/22  1:20 AM   Specimen: BLOOD  Result Value Ref Range Status   Specimen Description BLOOD RIGHT ANTECUBITAL  Final   Special Requests    Final    BOTTLES DRAWN AEROBIC AND ANAEROBIC Blood Culture results may not be optimal due to an inadequate volume of blood received in culture bottles   Culture   Final    NO GROWTH 4 DAYS Performed at [facility], [place] [address], [place], [place] [address]    Report Status PENDING  Incomplete     Time coordinating discharge:  I have spent 35 minutes face to face with the patient and on the ward discussing the patients care, assessment, plan and disposition with other care givers. >50% of the time was devoted counseling the patient about the risks and benefits of treatment/Discharge disposition and coordinating care.   SIGNED:   Damita Lack, MD  [facility] 02/28/2022, 10:50 AM   If 7PM-7AM, please contact night-coverage

## 2022-02-28 NOTE — Plan of Care (Signed)
  Problem: Education: Goal: Knowledge of General Education information will improve Description: Including pain rating scale, medication(s)/side effects and non-pharmacologic comfort measures Outcome: Adequate for Discharge   Problem: Health Behavior/Discharge Planning: Goal: Ability to manage health-related needs will improve Outcome: Adequate for Discharge   Problem: Clinical Measurements: Goal: Ability to maintain clinical measurements within normal limits will improve Outcome: Adequate for Discharge Goal: Will remain free from infection Outcome: Adequate for Discharge Goal: Diagnostic test results will improve Outcome: Adequate for Discharge Goal: Respiratory complications will improve Outcome: Adequate for Discharge Goal: Cardiovascular complication will be avoided Outcome: Adequate for Discharge   Problem: Activity: Goal: Risk for activity intolerance will decrease Outcome: Adequate for Discharge   Problem: Nutrition: Goal: Adequate nutrition will be maintained Outcome: Adequate for Discharge   Problem: Coping: Goal: Level of anxiety will decrease Outcome: Adequate for Discharge   Problem: Elimination: Goal: Will not experience complications related to bowel motility Outcome: Adequate for Discharge Goal: Will not experience complications related to urinary retention Outcome: Adequate for Discharge   Problem: Pain Managment: Goal: General experience of comfort will improve Outcome: Adequate for Discharge   Problem: Safety: Goal: Ability to remain free from injury will improve Outcome: Adequate for Discharge   Problem: Skin Integrity: Goal: Risk for impaired skin integrity will decrease Outcome: Adequate for Discharge   Problem: Acute Rehab PT Goals(only PT should resolve) Goal: Pt Will Go Supine/Side To Sit Outcome: Adequate for Discharge Goal: Patient Will Transfer Sit To/From Stand Outcome: Adequate for Discharge Goal: Pt Will Transfer Bed To Chair/Chair  To Bed Outcome: Adequate for Discharge Goal: Pt Will Ambulate Outcome: Adequate for Discharge Goal: Pt Will Go Up/Down Stairs Outcome: Adequate for Discharge Goal: Pt/caregiver will Perform Home Exercise Program Outcome: Adequate for Discharge

## 2022-02-28 NOTE — Progress Notes (Signed)
Physical Therapy Treatment Patient Details Name: Julie Silva MRN: [id number] DOB: 19-Dec-1927 Today's Date: 02/28/2022   History of Present Illness 89 yo female presents to [facility] from PCP on 2/21 with diverticulitis with possible microperforation. PMH includes PAF, bronchitic stasis, psuedogout, diverticulosis, CAD, self-catheterizes due to previous vaginal surgery, OP, OA, cholecystectomy, appendectomy.    PT Comments    Pt mobilizing in hallway with RW well, activity tolerance continues to improve. Pt required no physical assist for mobility this date, anticipate pt is close to or at mobility baseline at this time.    Recommendations for follow up therapy are one component of a multi-disciplinary discharge planning process, led by the attending physician.  Recommendations may be updated based on patient status, additional functional criteria and insurance authorization.  Follow Up Recommendations  No PT follow up     Assistance Recommended at Discharge Set up Supervision/Assistance  Patient can return home with the following A little help with walking and/or transfers;A little help with bathing/dressing/bathroom   Equipment Recommendations  None recommended by PT    Recommendations for Other Services       Precautions / Restrictions Precautions Precautions: Fall Restrictions Weight Bearing Restrictions: No     Mobility  Bed Mobility Overal bed mobility: Needs Assistance             General bed mobility comments: OOB with daughter    Transfers Overall transfer level: Needs assistance Equipment used: Rolling walker (2 wheels) Transfers: Sit to/from Stand Sit to Stand: Supervision           General transfer comment: safety, STS x3, from EOB and toilet x2    Ambulation/Gait Ambulation/Gait assistance: Supervision Gait Distance (Feet): 200 Feet Assistive device: Rolling walker (2 wheels) Gait Pattern/deviations: Step-through pattern, Decreased stride length,  Trunk flexed Gait velocity: slightly decr but improving     General Gait Details: cues for upright posture and placement in RW, good speed   Stairs             Wheelchair Mobility    Modified Rankin (Stroke Patients Only)       Balance Overall balance assessment: Needs assistance Sitting-balance support: No upper extremity supported, Feet supported Sitting balance-Leahy Scale: Fair     Standing balance support: Bilateral upper extremity supported, During functional activity, Reliant on assistive device for balance Standing balance-Leahy Scale: Poor                              Cognition Arousal/Alertness: Awake/alert Behavior During Therapy: WFL for tasks assessed/performed Overall Cognitive Status: Within Functional Limits for tasks assessed                                          Exercises      General Comments        Pertinent Vitals/Pain Pain Assessment Pain Assessment: Faces Faces Pain Scale: Hurts a little bit Pain Location: L hip Pain Descriptors / Indicators: Sore, Discomfort Pain Intervention(s): Limited activity within patient's tolerance    Home Living                          Prior Function            PT Goals (current goals can now be found in the care plan section) Acute Rehab PT Goals  Patient Stated Goal: home PT Goal Formulation: With patient/family Time For Goal Achievement: 03/10/22 Potential to Achieve Goals: Good Progress towards PT goals: Progressing toward goals    Frequency    Min 3X/week      PT Plan Current plan remains appropriate    Co-evaluation              AM-PAC PT "6 Clicks" Mobility   Outcome Measure  Help needed turning from your back to your side while in a flat bed without using bedrails?: A Little Help needed moving from lying on your back to sitting on the side of a flat bed without using bedrails?: A Little Help needed moving to and from a bed to a  chair (including a wheelchair)?: A Little Help needed standing up from a chair using your arms (e.g., wheelchair or bedside chair)?: A Little Help needed to walk in hospital room?: A Little Help needed climbing 3-5 steps with a railing? : A Little 6 Click Score: 18    End of Session   Activity Tolerance: Patient tolerated treatment well Patient left: with call bell/phone within reach;with family/visitor present;in bed Nurse Communication: Mobility status PT Visit Diagnosis: Other abnormalities of gait and mobility (R26.89);Muscle weakness (generalized) (M62.81)     [id number]  PT Time Calculation (min) (ACUTE ONLY): 21 min  Charges:  $Therapeutic Activity: 8-22 mins                     Stacie Glaze, PT DPT Acute Rehabilitation Services Pager [phone]  Office [phone]    Roxine Caddy E Ruffin Pyo 02/28/2022, 11:41 AM

## 2022-02-28 NOTE — Progress Notes (Signed)
Explained discharge instructions to patient. Reviewed follow up appointment and next medication administration times. Also reviewed education. Patient verbalized having an understanding for instructions given. All belongings are in the patient's possession to include TOC meds. IV was removed. No other needs verbalized. Transported downstairs for discharge.

## 2022-02-28 NOTE — Progress Notes (Signed)
Patient discharged home with family, no questions and concerns at this time.

## 2022-02-28 NOTE — Care Management Important Message (Signed)
Important Message  Patient Details  Name: Julie Silva MRN: [id number] Date of Birth: 1927/02/11   Medicare Important Message Given:  Yes  Patient left prior to IM delivery will mail copy to the patient home address.   Winefred Hillesheim 02/28/2022, 3:13 PM

## 2022-03-01 ENCOUNTER — Telehealth: Payer: Self-pay | Admitting: *Deleted

## 2022-03-01 ENCOUNTER — Other Ambulatory Visit: Payer: Self-pay | Admitting: Internal Medicine

## 2022-03-01 ENCOUNTER — Encounter: Payer: Self-pay | Admitting: *Deleted

## 2022-03-01 LAB — CULTURE, BLOOD (ROUTINE X 2): Culture: NO GROWTH

## 2022-03-01 NOTE — Transitions of Care (Post Inpatient/ED Visit) (Signed)
   03/01/2022  Name: Julie Silva MRN: [id number] DOB: 1927/12/03  Today's TOC FU Call Status: Today's TOC FU Call Status:: Unsuccessul Call (1st Attempt) Unsuccessful Call (1st Attempt) Date: 03/01/22  Attempted to reach the patient regarding the most recent Inpatient visit; initially successfully connected with daughter/ caregiver who requested call back in 10 minutes; re-attempted call as requested and got voice mail-- left voice message requesting call back  Follow Up Plan: Additional outreach attempts will be made to reach the patient to complete the Transitions of Care (Post Inpatient/ED visit) call.   Oneta Rack, RN, BSN, CCRN Alumnus RN CM Care Coordination/ Transition of [place] Management [phone]: direct office

## 2022-03-02 ENCOUNTER — Telehealth: Payer: Self-pay

## 2022-03-02 ENCOUNTER — Encounter: Payer: Self-pay | Admitting: *Deleted

## 2022-03-02 ENCOUNTER — Telehealth: Payer: Self-pay | Admitting: *Deleted

## 2022-03-02 NOTE — Telephone Encounter (Signed)
Key: [id number]

## 2022-03-02 NOTE — Transitions of Care (Post Inpatient/ED Visit) (Signed)
   03/02/2022  Name: Julie Silva MRN: [id number] DOB: July 21, 1927  Today's TOC FU Call Status: Today's TOC FU Call Status:: Unsuccessful Call (2nd Attempt) Unsuccessful Call (2nd Attempt) Date: 03/02/22  Attempted to reach the patient regarding the most recent Inpatient visit; left voice message requesting call back  Follow Up Plan: Additional outreach attempts will be made to reach the patient to complete the Transitions of Care (Post Inpatient visit) call.   Oneta Rack, RN, BSN, CCRN Alumnus RN CM Care Coordination/ Transition of [place] Management [phone]: direct office

## 2022-03-03 ENCOUNTER — Telehealth: Payer: Self-pay | Admitting: *Deleted

## 2022-03-03 ENCOUNTER — Encounter: Payer: Self-pay | Admitting: *Deleted

## 2022-03-03 NOTE — Transitions of Care (Post Inpatient/ED Visit) (Signed)
03/03/2022  Name: Julie Silva MRN: [id number] DOB: August 24, 1927  Today's TOC FU Call Status: Today's TOC FU Call Status:: Successful TOC FU Call Competed (verified HIPAA identifiers x 2 with patients daughter/ caregiver [name], on [facility] DPR) TOC FU Call Complete Date: 03/03/22  Transition Care Management Follow-up Telephone Call Date of Discharge: 02/28/22 Discharge [facility]) Type of Discharge: Inpatient Admission Primary Inpatient Discharge Diagnosis:: acute diverticulitis with micrperforation; AKI How have you been since you were released from the hospital?: Better (per caregiver/ daughter: "she is much better; I am a Dietitian and take care of all of her needs with her dementia and daily care.  She can do a lot on her own, but we have sitters that come in and help too- she is never by herself") Any questions or concerns?: No  Items Reviewed: Did you receive and understand the discharge instructions provided?: Yes (thoroughly reviewed with caregiver/ daughter today who verbalizes excellent understanding of same) Medications obtained and verified?: Yes (Medications Reviewed) (caregiver/ daughter manages all aspects of medication administration; confirms she obtained and is taking all medications as instructed post- recent hospital discharge; denies medication questions/ concerns; declines full medication review- is at work) Any new allergies since your discharge?: No Dietary orders reviewed?: Yes Type of Diet Ordered:: "healthy, low salt" Do you have support at home?: Yes People in Home: child(ren), adult Name of Support/Comfort Primary Source: patient technically resides alone, but has caregivers present x 24/ 7- both family members and private duty sitters; daughter reports patient is independent in most aspects of self-care  Home Care and Equipment/Supplies: [place] Ordered?: No Any new equipment or medical supplies ordered?: No  Functional  Questionnaire: Do you need assistance with bathing/showering or dressing?: Yes Do you need assistance with meal preparation?: Yes Do you need assistance with eating?: No Do you have difficulty maintaining continence: No Do you need assistance with getting out of bed/getting out of a chair/moving?: Yes Do you have difficulty managing or taking your medications?: Yes (caregiver/ daughter manages all aspects of medication administration)  Folllow up appointments reviewed: PCP Follow-up appointment confirmed?: Yes Date of PCP follow-up appointment?: 03/16/22 Follow-up Provider: PCP Dr. Ronnald Ramp Specialist [facility] Follow-up appointment confirmed?: No (not indicated per review of hospital discharging provider notes) Do you need transportation to your follow-up appointment?: No Do you understand care options if your condition(s) worsen?: Yes-patient verbalized understanding  SDOH Interventions Today    Flowsheet Row Most Recent Value  SDOH Interventions   Food Insecurity Interventions Intervention Not Indicated  Transportation Interventions Intervention Not Indicated  [caregiver/ daughter provides transportation,  other family members assists as indicated/ needed]      TOC Interventions Today    Flowsheet Row Most Recent Value  TOC Interventions   TOC Interventions Discussed/Reviewed TOC Interventions Discussed  [provided my direct contact information should questions/ concerns/ needs arise post-TOC call]      Interventions Today    Flowsheet Row Most Recent Value  Chronic Disease   Chronic disease during today's visit Other  [diverticulitis/ AKI/ dementia]  General Interventions   General Interventions Discussed/Reviewed General Interventions Discussed, Doctor Visits  Doctor Visits Discussed/Reviewed Doctor Visits Discussed, PCP  PCP/Specialist Visits Compliance with follow-up visit  Nutrition Interventions   Nutrition Discussed/Reviewed Nutrition Discussed, Fluid intake  Pharmacy  Interventions   Pharmacy Dicussed/Reviewed Pharmacy Topics Discussed      Oneta Rack, RN, BSN, CCRN Alumnus RN CM Care Coordination/ Transition of [place] Management [phone]:  direct office

## 2022-03-03 NOTE — Telephone Encounter (Signed)
Pharmacy Patient Advocate Encounter  Received notification from Medicare Part D that the request for prior authorization for Dronabinol has been denied due to .    Please be advised we currently do not have a Pharmacist to review denials, therefore you will need to process appeals accordingly as needed. Thanks for your support at this time.   You may call [phone] or fax [phone], to appeal.

## 2022-03-08 ENCOUNTER — Encounter: Payer: Self-pay | Admitting: Nurse Practitioner

## 2022-03-08 ENCOUNTER — Telehealth: Payer: Self-pay | Admitting: Internal Medicine

## 2022-03-08 ENCOUNTER — Other Ambulatory Visit: Payer: Self-pay | Admitting: Internal Medicine

## 2022-03-08 ENCOUNTER — Ambulatory Visit (INDEPENDENT_AMBULATORY_CARE_PROVIDER_SITE_OTHER): Payer: Medicare Other | Admitting: Nurse Practitioner

## 2022-03-08 ENCOUNTER — Other Ambulatory Visit (INDEPENDENT_AMBULATORY_CARE_PROVIDER_SITE_OTHER): Payer: Medicare Other

## 2022-03-08 ENCOUNTER — Encounter: Payer: Self-pay | Admitting: Internal Medicine

## 2022-03-08 VITALS — BP 130/60 | HR 87 | Ht 64.0 in | Wt 114.0 lb

## 2022-03-08 DIAGNOSIS — K5720 Diverticulitis of large intestine with perforation and abscess without bleeding: Secondary | ICD-10-CM

## 2022-03-08 DIAGNOSIS — R112 Nausea with vomiting, unspecified: Secondary | ICD-10-CM

## 2022-03-08 DIAGNOSIS — R1032 Left lower quadrant pain: Secondary | ICD-10-CM

## 2022-03-08 DIAGNOSIS — R64 Cachexia: Secondary | ICD-10-CM

## 2022-03-08 DIAGNOSIS — J449 Chronic obstructive pulmonary disease, unspecified: Secondary | ICD-10-CM

## 2022-03-08 LAB — COMPREHENSIVE METABOLIC PANEL
ALT: 13 U/L (ref 0–35)
AST: 16 U/L (ref 0–37)
Albumin: 3.6 g/dL (ref 3.5–5.2)
Alkaline Phosphatase: 90 U/L (ref 39–117)
BUN: 31 mg/dL — ABNORMAL HIGH (ref 6–23)
CO2: 29 mEq/L (ref 19–32)
Calcium: 9.9 mg/dL (ref 8.4–10.5)
Chloride: 101 mEq/L (ref 96–112)
Creatinine, Ser: 0.84 mg/dL (ref 0.40–1.20)
GFR: 60.19 mL/min (ref >60.00)
Glucose, Bld: 98 mg/dL (ref 70–99)
Potassium: 4.3 mEq/L (ref 3.5–5.1)
Sodium: 139 mEq/L (ref 135–145)
Total Bilirubin: 0.2 mg/dL (ref 0.2–1.2)
Total Protein: 7.7 g/dL (ref 6.0–8.3)

## 2022-03-08 LAB — CBC WITH DIFFERENTIAL/PLATELET
Basophils Absolute: 0 10*3/uL (ref 0.0–0.1)
Basophils Relative: 0.3 % (ref 0.0–3.0)
Eosinophils Absolute: 0.2 10*3/uL (ref 0.0–0.7)
Eosinophils Relative: 3.3 % (ref 0.0–5.0)
HCT: 34.4 % — ABNORMAL LOW (ref 36.0–46.0)
Hemoglobin: 11.4 g/dL — ABNORMAL LOW (ref 12.0–15.0)
Lymphocytes Relative: 21.6 % (ref 12.0–46.0)
Lymphs Abs: 1.5 10*3/uL (ref 0.7–4.0)
MCHC: 33.1 g/dL (ref 30.0–36.0)
MCV: 90.8 fl (ref 78.0–100.0)
Monocytes Absolute: 1 10*3/uL (ref 0.1–1.0)
Monocytes Relative: 13.5 % — ABNORMAL HIGH (ref 3.0–12.0)
Neutro Abs: 4.3 10*3/uL (ref 1.4–7.7)
Neutrophils Relative %: 61.3 % (ref 43.0–77.0)
Platelets: 483 10*3/uL — ABNORMAL HIGH (ref 150.0–400.0)
RBC: 3.79 Mil/uL — ABNORMAL LOW (ref 3.87–5.11)
RDW: 14.2 % (ref 11.5–15.5)
WBC: 7.1 10*3/uL (ref 4.0–10.5)

## 2022-03-08 LAB — LIPASE: Lipase: 44 U/L (ref 11.0–59.0)

## 2022-03-08 MED ORDER — DRONABINOL 5 MG PO CAPS: ORAL_CAPSULE | ORAL | 1 refills | Status: DC

## 2022-03-08 NOTE — Patient Instructions (Signed)
Your provider has requested that you go to the basement level for lab work before leaving today. Press "B" on the elevator. The lab is located at the first door on the left as you exit the elevator.  Continue Zofran 4 mg.  Go to the ED if you develop severe abdominal pain.  You have been scheduled for a CT scan of the abdomen and pelvis at [facility], 1st floor Radiology. You are scheduled on 03/09/22 at 2 pm. You should arrive 2 hours prior to your appointment time for registration(Arrive at 12 pm).  Make sure that you have nothing to eat or drink after 10 am.   If you have any questions regarding your exam or if you need to reschedule, you may call [facility] at [phone] between the hours of 8:00 am and 5:00 pm, Monday-Friday.   Due to recent changes in healthcare laws, you may see the results of your imaging and laboratory studies on MyChart before your provider has had a chance to review them.  We understand that in some cases there may be results that are confusing or concerning to you. Not all laboratory results come back in the same time frame and the provider may be waiting for multiple results in order to interpret others.  Please give [place] 48 hours in order for your provider to thoroughly review all the results before contacting the office for clarification of your results.   Thank you for trusting me with your gastrointestinal care!   Carl Best, CRNP

## 2022-03-08 NOTE — Telephone Encounter (Signed)
Daughter states pt was recently in 61 with micro perf from diverticulitis. States her mother was doing better but yesterday started c/o her stomach not feeling well, pants were tighter and she was bloated. Daughter requests pt be seen. Pt scheduled to see Carl Best NP today at 2:30pm. Daughter aware of appt.

## 2022-03-08 NOTE — Telephone Encounter (Signed)
PT's daughter is calling to get an appointment for her mother. She was recently discharged from the hospital for diverticulitis and she is currently having some stomach discomfort. She is looking to find out if there is a way they can have some options for relief or maybe finding an appointment for a FU. Please advise.

## 2022-03-08 NOTE — Progress Notes (Signed)
03/08/2022 MEGANN BARTUNEK [id number] [birth year]   Chief Complaint: Diverticulitis, hospital follow up  History of Present Illness: Julie Silva is a 89 year old female with a past medical history of atrial fibrillation on Eliquis, sick sinus syndrome s/p pacemaker placement, bronchiectasis with underlying MAI, CKD stage IIIA, chronic UTIs, pancreatic cysts, choledocholithiasis status post ERCP 04/2009, small hiatal hernia, GERD, intermittent overflow diarrhea and diverticulosis. She is known by Dr. Hilarie Fredrickson.  She developed lower abdominal pain with night time fevers for a few days then saw her PCP who ordered labs and an abdominal/pelvic CT.  Labs 02/23/2022 showed a WBC count of 14.2.  Hemoglobin 11.6 (down from Hg 12.07 September 2021).  Normal electrolytes.  BUN 40.  Creatinine 0.99.  CTAP at [facility] imaging identified diverticulitis of the mid descending colon with evidence of a microperforation.  She was sent to [facility] for admission.  She was admitted to the hospital 02/23/2022 due to having nausea, vomiting, diarrhea and LLQ pain.  CTAP identified acute diverticulitis with microperforation. She was started on Zosyn IV and she was evaluated by general surgery who recommended conservative management.  She was placed on IV fluids and bowel rest.  Her clinical status stabilized and she was discharged home 02/28/2022 on Augmentin 875 mg twice daily to complete a 2-week course, last dose will be tomorrow.   She presents today for further follow up.  She is accompanied by her daughter Julie Silva.  She stated her lower abdominal pain completely abated prior to being discharged home.  As she was getting dressed prior to being discharged home 02/28/2022, she felt a little soreness where the waistline of her pants rested on the upper abdomen but she didn't think much about it. Her upper abdominal discomfort persisted and she had a few episodes of nausea and vomiting. She had a  temp of 99 F yesterday and vomited 4 to 5 times.  No coffee-ground, black or frank red hematemesis.  She took Zofran at 6:30 PM yesterday without any further vomiting since then.  He ate yogurt, stewed potatoes, cooked carrots and Jell-O salad and drink boost thus far today.  She is eating small bits of food every hour or 2.  No further diarrhea.  She passed a solid brown stool yesterday today she passed a small formed ball of stool.  No rectal bleeding or black stools.  She remains on Eliquis for A-fib.  She had at least 1 colonoscopy in her lifetime which was at least 20 years ago, results unknown.  No known family history of colorectal cancer.  No GERD symptoms.  She remains on Omeprazole 20 mg daily.  Mother with history of stomach cancer.  Her daughter stated the patient also has intermittent nausea with vomiting which occurs sporadically for few months prior to her hospitalization 02/2022.  Weight stable on Marinol twice daily.     Latest Ref Rng & Units 02/28/2022    7:39 AM 02/27/2022    4:50 AM 02/26/2022    6:42 AM  CBC  WBC 4.0 - 10.5 K/uL 7.0  5.9  8.0   Hemoglobin 12.0 - 15.0 g/dL 9.9  9.6  9.9   Hematocrit 36.0 - 46.0 % 31.5  30.0  31.1   Platelets 150 - 400 K/uL 247  244  258        Latest Ref Rng & Units 02/28/2022    7:39 AM 02/27/2022    4:50 AM 02/26/2022    6:42 AM  CMP  Glucose 70 - 99 mg/dL 97  82  75   BUN 8 - 23 mg/dL '8  16  25   '$ Creatinine 0.44 - 1.00 mg/dL 0.96  1.08  1.37   Sodium 135 - 145 mmol/L 138  138  138   Potassium 3.5 - 5.1 mmol/L 3.5  3.8  3.3   Chloride 98 - 111 mmol/L 104  102  104   CO2 22 - 32 mmol/L '22  21  19   '$ Calcium 8.9 - 10.3 mg/dL 8.4  8.4  8.2     CTAP 02/23/2022: FINDINGS: Lower chest: As seen on the prior examination there areas of nodularity with opacity along the right lower lobe. Please correlate with prior workup. This would have a differential including aggressive process. This is similar to the recent chest CT of 12/15/2021. Stable  leadless pacemaker again noted along the anterior margin of the right ventricle.   Hepatobiliary: Previous cholecystectomy. Minimal ectasia of the biliary tree. No space-occupying liver lesion.   Pancreas: Small cysts again seen along the midbody of the pancreas inferiorly, unchanged from previous measuring up to 12 mm. These appear slightly smaller today.   Spleen: Splenic granulomas.   Adrenals/Urinary Tract: Adrenal glands are preserved. Mild bilateral renal atrophy with stable Bosniak 1 bilateral small renal cysts. No specific imaging follow-up. The ureters have normal course and caliber extending down to the bladder.   Stomach/Bowel: Colonic diverticulosis identified. There is wall thickening and stranding along the descending colon consistent with an area of diverticulitis. On sagittal image 90 of series 6 there are some tiny bolus of air immediately adjacent to the wall of the bowel in this location with confluence stranding. This could represent a microperforation. This area of ill-defined fluid on axial image 56 of series 2 measures 2.3 by 1.5 cm. More proximally the colon is nondilated. Small hiatal hernia. The small bowel is nondilated.   Vascular/Lymphatic: Normal caliber aorta and IVC. Scattered vascular calcifications identified. There are areas of stenosis suggested along the origin of the right renal artery. Please correlate for any level of hypertension.   Reproductive: Uterus itself is absent. No adnexal mass. As seen previously there is again elongation of the vaginal canal extending to the presacral region. Again seen on coronal and sagittal images. Sagittal image 61 of series 6. History of vaginal pexy. Again the etiology of the luminal gas and fluid within this structure is uncertain. There are some adjacent loops of small bowel and some mild stranding.   Other: No abdominal wall hernia or abnormality. No abdominopelvic ascites.   Musculoskeletal:  Degenerative changes seen of the spine and pelvis.   IMPRESSION: Colonic diverticulosis with an area of inflammatory stranding and wall thickening along the mid descending colon consistent with an area of diverticulitis. Few bubbles of air immediately adjacent to the bowel wall posteriorly in this location could represent microperforation with early phlegmonous change. No widespread free air or obstruction. Recommend follow-up to confirm clearance.   Stable changes of vaginal pexy with gas and fluid in the vagina extending up to the presacral region with some stranding. Please correlate with any prior workup.   Stable cystic lesions along the midbody of the pancreas. In light of the patient's age simple follow up in 2 years could be performed.   Masslike opacities in the right lower lobe of the lung as on prior CT of the chest from December 2023.   Current Outpatient Medications on File Prior to Visit  Medication  Sig Dispense Refill   acetaminophen (TYLENOL) 500 MG tablet Take 500 mg by mouth 2 (two) times daily.     amoxicillin-clavulanate (AUGMENTIN) 400-57 MG/5ML suspension Take 10 mLs (800 mg total) by mouth every 12 (twelve) hours for 10 days. 200 mL 0   bimatoprost (LUMIGAN) 0.01 % SOLN Place 1 drop into both eyes at bedtime.     Catheters MISC Replace catheter twice daily. 180 each 1   dorzolamide-timolol (COSOPT) 22.3-6.8 MG/ML ophthalmic solution Place 1 drop into both eyes 2 (two) times daily.     dronabinol (MARINOL) 5 MG capsule TAKE 1 CAPSULE BY MOUTH TWICE A DAY BEFORE MEALS FOR APPETITE (100 X 2.5 MG ON OCT 5) 60 capsule 1   ELIQUIS 2.5 MG TABS tablet TAKE 1 TABLET TWICE A DAY (Patient taking differently: Take 2.5 mg by mouth 2 (two) times daily.) 180 tablet 0   estradiol (ESTRACE) 0.1 MG/GM vaginal cream Place 1 Applicatorful vaginally every other day.     gabapentin (NEURONTIN) 100 MG capsule Take 300 mg by mouth 2 (two) times daily.     guaifenesin (ROBITUSSIN) 100  MG/5ML syrup Take 100 mg by mouth 3 (three) times daily.     Melatonin 1 MG CHEW Chew 1 tablet by mouth as needed (sleep).     omeprazole (PRILOSEC) 20 MG capsule TAKE 1 CAPSULE BY MOUTH EVERY DAY (Patient taking differently: Take 20 mg by mouth daily. TAKE 1 CAPSULE BY MOUTH EVERY DAY) 90 capsule 1   ondansetron (ZOFRAN-ODT) 4 MG disintegrating tablet Take 1 tablet (4 mg total) by mouth every 8 (eight) hours as needed for nausea or vomiting. 30 tablet 0   prochlorperazine (COMPAZINE) 10 MG tablet Take 10 mg by mouth every 4 (four) hours as needed for nausea or vomiting.     sodium chloride HYPERTONIC 3 % nebulizer solution Take by nebulization 2 (two) times daily as needed for other. 750 mL 12   Wheat Dextrin (BENEFIBER PO) Take 5 mLs by mouth 2 (two) times daily. (Patient not taking: Reported on 03/08/2022)     No current facility-administered medications on file prior to visit.   Allergies  Allergen Reactions   Codeine Other (See Comments)    Passed out   Colchicine Diarrhea   Current Medications, Allergies, Past Medical History, Past Surgical History, Family History and Social History were reviewed in [employer] record.  Review of Systems:   Constitutional: Negative for fever, sweats, chills or weight loss.  Respiratory: Negative for shortness of breath.   Cardiovascular: Negative for chest pain, palpitations and leg swelling.  Gastrointestinal: See HPI.  Musculoskeletal: Negative for back pain or muscle aches.  Neurological: Negative for dizziness, headaches or paresthesias.    Physical Exam BP 130/60   Pulse 87   Ht '5\' 4"'$  (1.626 m)   Wt 114 lb (51.7 kg)   SpO2 93%   BMI 19.57 kg/m   Wt Readings from Last 3 Encounters:  03/08/22 114 lb (51.7 kg)  02/28/22 118 lb 2 oz (53.6 kg)  02/23/22 118 lb 2 oz (53.6 kg)    General: 89 year old female who ambulates with the assistance of a walker in no acute distress. Head: Normocephalic and atraumatic. Eyes:  No scleral icterus. Conjunctiva pink . Ears: Normal auditory acuity. Mouth: Upper and lower dentures.  No ulcers or lesions.  Lungs: Clear throughout to auscultation. Heart: Regular rate and rhythm, no murmur. Abdomen: Soft, nondistended.  Moderate epigastric tenderness which extends to above the umbilicus, moderate LLQ tenderness  without rebound or guarding but patient verbally moaned out loud when I pressed on this area.  Negative shake tenderness.  No masses or hepatomegaly. Normal bowel sounds x 4 quadrants.  Rectal: Deferred. Musculoskeletal: Symmetrical with no gross deformities. Extremities: No edema. Neurological: Alert oriented x 4. No focal deficits.  Psychological: Alert and cooperative. Normal mood and affect  Assessment and Recommendations:  89 year old female admitted to the hospital 2/21 - 02/18/2022 with fevers, N/V/D and LLQ abdominal pain.  WBC 14.2.  [facility]AP at [facility] identified diverticulitis to the mid descending colon with evidence of a microperforation treated conservatively with IV antibiotics, surgery was not indicated.  Her LLQ pain abated and she was discharged home 02/28/2022 with a course of Augmentin to complete a total of 14 days.  She denies having any further LLQ pain, however, on exam today she had moderate LLQ pain without rebound or guarding.  No further diarrhea. -CBC, CMP and lipase level -CTAP with oral and IV contrast scheduled 03/09/2022 -Patient was instructed to go to the emergency room if she develops severe abdominal pain -Push fluids, diet as tolerated  Intermittent N/V for the past few months with central upper abdominal pain which started 02/28/2022.  No history of a bowel blockage. -CT and labs as ordered above, further recommendation to be determined after results reviewed -Zofran 4 mg ODT every 8 hours as needed -Continue Omeprazole 20 mg p.o. daily  Normocytic Anemia. Hg 11.6 -> 9.9. No overt GI bleeding. -CBC as ordered above   A-fib on  Eliquis  Stable cystic lesion of pancreas   Addendum: 89 year old with recent diverticulitis.  Known significant diverticulosis. CT scan reviewed which shows improving inflammation though possibly not quite yet resolved.  Imaging difficult to tell if there is a small pocket of gas versus diverticulum. Agree with continued oral antibiotics Lab results show improving white count Hopefully the upper GI symptoms are more antibiotic associated and will resolve after she is done with antibiotics No further recommendations at this time Follow-up as recommended

## 2022-03-09 ENCOUNTER — Ambulatory Visit (HOSPITAL_COMMUNITY)
Admission: RE | Admit: 2022-03-09 | Discharge: 2022-03-09 | Disposition: A | Discharge status: Home / Self Care | Payer: Medicare Other | Source: Ambulatory Visit | Attending: Nurse Practitioner | Admitting: Nurse Practitioner

## 2022-03-09 DIAGNOSIS — R1032 Left lower quadrant pain: Secondary | ICD-10-CM

## 2022-03-09 DIAGNOSIS — R112 Nausea with vomiting, unspecified: Secondary | ICD-10-CM | POA: Diagnosis not present

## 2022-03-09 DIAGNOSIS — K5720 Diverticulitis of large intestine with perforation and abscess without bleeding: Secondary | ICD-10-CM

## 2022-03-09 MED ORDER — SODIUM CHLORIDE (PF) 0.9 % IJ SOLN
INTRAMUSCULAR | Status: AC
  Filled 2022-03-09: qty 50

## 2022-03-09 MED ORDER — IOHEXOL 300 MG/ML  SOLN
80.0000 mL | Freq: Once | INTRAMUSCULAR | Status: AC | PRN
  Administered 2022-03-09: 80 mL via INTRAVENOUS

## 2022-03-09 MED ORDER — IOHEXOL 9 MG/ML PO SOLN
500.0000 mL | ORAL | Status: AC
  Administered 2022-03-09: 500 mL via ORAL

## 2022-03-16 ENCOUNTER — Encounter: Payer: Self-pay | Admitting: Internal Medicine

## 2022-03-16 ENCOUNTER — Ambulatory Visit (INDEPENDENT_AMBULATORY_CARE_PROVIDER_SITE_OTHER): Payer: Medicare Other | Admitting: Internal Medicine

## 2022-03-16 VITALS — BP 136/64 | HR 86 | Temp 98.2°F | Ht 64.0 in | Wt 117.0 lb

## 2022-03-16 DIAGNOSIS — D6869 Other thrombophilia: Secondary | ICD-10-CM | POA: Diagnosis not present

## 2022-03-16 DIAGNOSIS — I480 Paroxysmal atrial fibrillation: Secondary | ICD-10-CM

## 2022-03-16 DIAGNOSIS — Z515 Encounter for palliative care: Secondary | ICD-10-CM | POA: Diagnosis not present

## 2022-03-16 MED ORDER — APIXABAN 2.5 MG PO TABS: 2.5000 mg | ORAL_TABLET | Freq: Two times a day (BID) | ORAL | 1 refills | Status: DC

## 2022-03-16 MED ORDER — GABAPENTIN 100 MG PO CAPS: 300.0000 mg | ORAL_CAPSULE | Freq: Two times a day (BID) | ORAL | 1 refills | Status: DC

## 2022-03-16 NOTE — Progress Notes (Unsigned)
Subjective:  Patient ID: Julie Silva, female    DOB: 1927-01-29  Age: 89 y.o. MRN: [id number]  CC: No chief complaint on file.   HPI Julie Silva presents for ***  Outpatient Medications Prior to Visit  Medication Sig Dispense Refill   acetaminophen (TYLENOL) 500 MG tablet Take 500 mg by mouth 2 (two) times daily.     bimatoprost (LUMIGAN) 0.01 % SOLN Place 1 drop into both eyes at bedtime.     Catheters MISC Replace catheter twice daily. 180 each 1   dorzolamide-timolol (COSOPT) 22.3-6.8 MG/ML ophthalmic solution Place 1 drop into both eyes 2 (two) times daily.     dronabinol (MARINOL) 5 MG capsule TAKE 1 CAPSULE BY MOUTH TWICE A DAY BEFORE MEALS FOR APPETITE (100 X 2.5 MG ON OCT 5) 60 capsule 1   estradiol (ESTRACE) 0.1 MG/GM vaginal cream Place 1 Applicatorful vaginally every other day.     guaifenesin (ROBITUSSIN) 100 MG/5ML syrup Take 100 mg by mouth 3 (three) times daily.     Melatonin 1 MG CHEW Chew 1 tablet by mouth as needed (sleep).     omeprazole (PRILOSEC) 20 MG capsule TAKE 1 CAPSULE BY MOUTH EVERY DAY (Patient taking differently: Take 20 mg by mouth daily. TAKE 1 CAPSULE BY MOUTH EVERY DAY) 90 capsule 1   ondansetron (ZOFRAN-ODT) 4 MG disintegrating tablet Take 1 tablet (4 mg total) by mouth every 8 (eight) hours as needed for nausea or vomiting. 30 tablet 0   prochlorperazine (COMPAZINE) 10 MG tablet Take 10 mg by mouth every 4 (four) hours as needed for nausea or vomiting.     sodium chloride HYPERTONIC 3 % nebulizer solution Take by nebulization 2 (two) times daily as needed for other. 750 mL 12   Wheat Dextrin (BENEFIBER PO) Take 5 mLs by mouth 2 (two) times daily.     ELIQUIS 2.5 MG TABS tablet TAKE 1 TABLET TWICE A DAY (Patient taking differently: Take 2.5 mg by mouth 2 (two) times daily.) 180 tablet 0   gabapentin (NEURONTIN) 100 MG capsule Take 300 mg by mouth 2 (two) times daily.     No facility-administered medications prior to visit.    ROS Review of  Systems  Objective:  BP 136/64 (BP Location: Right Arm, Patient Position: Sitting, Cuff Size: Normal)   Pulse 86   Temp 98.2 F (36.8 C) (Oral)   Ht '5\' 4"'$  (1.626 m)   Wt 117 lb (53.1 kg)   SpO2 94%   BMI 20.08 kg/m   BP Readings from Last 3 Encounters:  03/16/22 136/64  03/08/22 130/60  02/28/22 (!) 146/62    Wt Readings from Last 3 Encounters:  03/16/22 117 lb (53.1 kg)  03/08/22 114 lb (51.7 kg)  02/28/22 118 lb 2 oz (53.6 kg)    Physical Exam  Lab Results  Component Value Date   WBC 7.1 03/08/2022   HGB 11.4 (L) 03/08/2022   HCT 34.4 (L) 03/08/2022   PLT 483.0 (H) 03/08/2022   GLUCOSE 98 03/08/2022   CHOL 168 04/04/2016   TRIG 105.0 04/04/2016   HDL 58.10 04/04/2016   LDLCALC 89 04/04/2016   ALT 13 03/08/2022   AST 16 03/08/2022   NA 139 03/08/2022   K 4.3 03/08/2022   CL 101 03/08/2022   CREATININE 0.84 03/08/2022   BUN 31 (H) 03/08/2022   CO2 29 03/08/2022   TSH 4.81 03/30/2021   INR 1.1 02/24/2022   HGBA1C 5.5 05/07/2013    CT Abdomen Pelvis  W Contrast  Result Date: 03/09/2022 CLINICAL DATA:  Nausea and vomiting, left lower quadrant abdominal pain. Recent diverticulitis with micro perforation. EXAM: CT ABDOMEN AND PELVIS WITH CONTRAST TECHNIQUE: Multidetector CT imaging of the abdomen and pelvis was performed using the standard protocol following bolus administration of intravenous contrast. RADIATION DOSE REDUCTION: This exam was performed according to the departmental dose-optimization program which includes automated exposure control, adjustment of the mA and/or kV according to patient size and/or use of iterative reconstruction technique. CONTRAST:  66m OMNIPAQUE IOHEXOL 300 MG/ML  SOLN COMPARISON:  02/23/2022 FINDINGS: Lower chest: Substantial clustered nodularity with some consolidation in the right lower lobe, possibilities including atypical pneumonia or local malignancy, correlate with any prior workup. Lead less right ventricular pacer noted.  Hepatobiliary: Cholecystectomy.  Otherwise unremarkable Pancreas: Small cystic lesions along the pancreatic parenchyma, largest exophytic extending caudad from the pancreatic body and measuring 1.8 by 1.1 cm on image 25 series 2, previously 1.5 by 1.1 cm by my measurements, although the difference may be based on orientation. Possibilities include intraductal papillary mucinous neoplasm or postinflammatory cystic lesion. Cystic lesions have been noted in the pancreas since at least 2011. Spleen: Punctate calcifications compatible with old granulomatous disease. Adrenals/Urinary Tract: Cortical thinning in both kidneys compatible with mild renal atrophy. Stable bilateral Bosniak category 1 renal cysts do not require further workup. Adrenal glands appear normal. Small cystocele extends just below the pubococcygeal line. Stomach/Bowel: Improved appearance of diverticulitis at the junction of the descending and the sigmoid colon. There continues to be a small less than 1 cc collection of gas along the colon wall in this vicinity on image 48 series 6 with a small amount of surrounding inflammatory stranding, this gas could represent a small residual extraluminal collection at the level of the previous presumed extraluminal gas, or may conceivably be within a diverticulum. Residuum from local micro perforation is favored but not certain. Vascular/Lymphatic: Atherosclerosis is present, including aortoiliac atherosclerotic disease. Reproductive: Vaginopexy with tubular vagina extending up to the presacral space at S1, and with mottled gas density and material within the somewhat narrow tubular canal. This is presumably debris and less likely to represent stool material in the setting of intra vaginal or colovaginal fistula, and no fistula is directly seen, although loops of small bowel to extend around the vagina. Correlate with any vaginal discharge. Other: No supplemental non-categorized findings. Musculoskeletal: Mild  left foraminal stenosis at L5-S1 due to disc bulge and facet arthropathy. IMPRESSION: 1. Improved appearance of diverticulitis at the junction of the descending and sigmoid colon. There continues to be a small less than 1 cc collection of gas along the colon wall in this vicinity with a small amount of surrounding inflammatory stranding, this could represent a small residual extraluminal collection at the level of the previous presumed extraluminal gas, or may conceivably be within a diverticulum. 2. Extensive but stable clustered nodularity with some consolidation in the right lower lobe, possibilities include atypical pneumonia or local malignancy, correlate with any prior workup. 3. Small cystic lesions along the pancreatic parenchyma, largest exophytic extending caudad from the pancreatic body and measuring 1.8 by 1.1 cm, previously 1.5 by 1.1 cm by my measurements (difference in size attributable to different orientation). Possibilities include intraductal papillary mucinous neoplasm or postinflammatory cystic lesion. Cystic lesions have been noted in the pancreas since at least 2011. In light of the patient's age, follow up pancreatic protocol imaging is considered optional, but if desired, consider pancreatic protocol CT or MRI in 2 years time.  4. Small cystocele extends just below the pubococcygeal line. 5. Stable vaginal debris and prior vaginopexy, correlate with any vaginal discharge. 6. Mild left foraminal stenosis at L5-S1 due to disc bulge and facet arthropathy. 7. Aortic atherosclerosis. Aortic Atherosclerosis (ICD10-I70.0). Electronically Signed   By: Van Clines M.D.   On: 03/09/2022 15:12    Assessment & Plan:  Paroxysmal atrial fibrillation (HCC) -     Apixaban; Take 1 tablet (2.5 mg total) by mouth 2 (two) times daily.  Dispense: 180 tablet; Refill: 1  Acquired thrombophilia (HCC) -     Apixaban; Take 1 tablet (2.5 mg total) by mouth 2 (two) times daily.  Dispense: 180 tablet;  Refill: 1  Encounter for palliative care involving management of pain -     Gabapentin; Take 3 capsules (300 mg total) by mouth 2 (two) times daily.  Dispense: 180 capsule; Refill: 1     Follow-up: No follow-ups on file.  Scarlette Calico, MD

## 2022-03-23 ENCOUNTER — Encounter (HOSPITAL_COMMUNITY): Payer: Self-pay

## 2022-03-23 ENCOUNTER — Emergency Department (HOSPITAL_COMMUNITY)
Admission: EM | Admit: 2022-03-23 | Discharge: 2022-03-24 | Disposition: A | Discharge status: Home / Self Care | Payer: Medicare Other | Attending: Emergency Medicine | Admitting: Emergency Medicine

## 2022-03-23 ENCOUNTER — Emergency Department (HOSPITAL_COMMUNITY): Payer: Medicare Other

## 2022-03-23 DIAGNOSIS — B9689 Other specified bacterial agents as the cause of diseases classified elsewhere: Secondary | ICD-10-CM | POA: Insufficient documentation

## 2022-03-23 DIAGNOSIS — R109 Unspecified abdominal pain: Secondary | ICD-10-CM | POA: Diagnosis present

## 2022-03-23 DIAGNOSIS — Z20822 Contact with and (suspected) exposure to covid-19: Secondary | ICD-10-CM | POA: Diagnosis not present

## 2022-03-23 DIAGNOSIS — Z7901 Long term (current) use of anticoagulants: Secondary | ICD-10-CM | POA: Diagnosis not present

## 2022-03-23 DIAGNOSIS — K5792 Diverticulitis of intestine, part unspecified, without perforation or abscess without bleeding: Secondary | ICD-10-CM | POA: Diagnosis not present

## 2022-03-23 DIAGNOSIS — N281 Cyst of kidney, acquired: Secondary | ICD-10-CM | POA: Diagnosis not present

## 2022-03-23 DIAGNOSIS — R509 Fever, unspecified: Secondary | ICD-10-CM

## 2022-03-23 DIAGNOSIS — J479 Bronchiectasis, uncomplicated: Secondary | ICD-10-CM | POA: Diagnosis not present

## 2022-03-23 DIAGNOSIS — R059 Cough, unspecified: Secondary | ICD-10-CM | POA: Diagnosis not present

## 2022-03-23 DIAGNOSIS — R918 Other nonspecific abnormal finding of lung field: Secondary | ICD-10-CM | POA: Diagnosis not present

## 2022-03-23 DIAGNOSIS — K5732 Diverticulitis of large intestine without perforation or abscess without bleeding: Secondary | ICD-10-CM | POA: Diagnosis not present

## 2022-03-23 DIAGNOSIS — N390 Urinary tract infection, site not specified: Secondary | ICD-10-CM

## 2022-03-23 DIAGNOSIS — K5730 Diverticulosis of large intestine without perforation or abscess without bleeding: Secondary | ICD-10-CM | POA: Diagnosis not present

## 2022-03-23 DIAGNOSIS — R1032 Left lower quadrant pain: Secondary | ICD-10-CM

## 2022-03-23 LAB — URINALYSIS, ROUTINE W REFLEX MICROSCOPIC
Bilirubin Urine: NEGATIVE
Glucose, UA: NEGATIVE mg/dL
Ketones, ur: NEGATIVE mg/dL
Nitrite: POSITIVE — AB
Protein, ur: NEGATIVE mg/dL
Specific Gravity, Urine: 1.019 (ref 1.005–1.030)
WBC, UA: >50 WBC/hpf (ref 0–5)
pH: 5 (ref 5.0–8.0)

## 2022-03-23 LAB — COMPREHENSIVE METABOLIC PANEL
ALT: 11 U/L (ref 0–44)
AST: 19 U/L (ref 15–41)
Albumin: 3 g/dL — ABNORMAL LOW (ref 3.5–5.0)
Alkaline Phosphatase: 90 U/L (ref 38–126)
Anion gap: 10 (ref 5–15)
BUN: 33 mg/dL — ABNORMAL HIGH (ref 8–23)
CO2: 22 mmol/L (ref 22–32)
Calcium: 8.8 mg/dL — ABNORMAL LOW (ref 8.9–10.3)
Chloride: 103 mmol/L (ref 98–111)
Creatinine, Ser: 0.93 mg/dL (ref 0.44–1.00)
GFR, Estimated: 58 mL/min — ABNORMAL LOW (ref >60)
Glucose, Bld: 115 mg/dL — ABNORMAL HIGH (ref 70–99)
Potassium: 4.3 mmol/L (ref 3.5–5.1)
Sodium: 135 mmol/L (ref 135–145)
Total Bilirubin: 0.6 mg/dL (ref 0.3–1.2)
Total Protein: 7.3 g/dL (ref 6.5–8.1)

## 2022-03-23 LAB — CBC WITH DIFFERENTIAL/PLATELET
Abs Immature Granulocytes: 0.08 10*3/uL — ABNORMAL HIGH (ref 0.00–0.07)
Basophils Absolute: 0 10*3/uL (ref 0.0–0.1)
Basophils Relative: 0 %
Eosinophils Absolute: 0.1 10*3/uL (ref 0.0–0.5)
Eosinophils Relative: 1 %
HCT: 34.4 % — ABNORMAL LOW (ref 36.0–46.0)
Hemoglobin: 10.9 g/dL — ABNORMAL LOW (ref 12.0–15.0)
Immature Granulocytes: 1 %
Lymphocytes Relative: 12 %
Lymphs Abs: 1.3 10*3/uL (ref 0.7–4.0)
MCH: 29.1 pg (ref 26.0–34.0)
MCHC: 31.7 g/dL (ref 30.0–36.0)
MCV: 92 fL (ref 80.0–100.0)
Monocytes Absolute: 1.2 10*3/uL — ABNORMAL HIGH (ref 0.1–1.0)
Monocytes Relative: 11 %
Neutro Abs: 8.3 10*3/uL — ABNORMAL HIGH (ref 1.7–7.7)
Neutrophils Relative %: 75 %
Platelets: 314 10*3/uL (ref 150–400)
RBC: 3.74 MIL/uL — ABNORMAL LOW (ref 3.87–5.11)
RDW: 14.5 % (ref 11.5–15.5)
WBC: 11 10*3/uL — ABNORMAL HIGH (ref 4.0–10.5)
nRBC: 0 % (ref 0.0–0.2)

## 2022-03-23 LAB — I-STAT CHEM 8, ED
BUN: 31 mg/dL — ABNORMAL HIGH (ref 8–23)
Calcium, Ion: 1.1 mmol/L — ABNORMAL LOW (ref 1.15–1.40)
Chloride: 106 mmol/L (ref 98–111)
Creatinine, Ser: 0.9 mg/dL (ref 0.44–1.00)
Glucose, Bld: 117 mg/dL — ABNORMAL HIGH (ref 70–99)
HCT: 32 % — ABNORMAL LOW (ref 36.0–46.0)
Hemoglobin: 10.9 g/dL — ABNORMAL LOW (ref 12.0–15.0)
Potassium: 4.4 mmol/L (ref 3.5–5.1)
Sodium: 138 mmol/L (ref 135–145)
TCO2: 21 mmol/L — ABNORMAL LOW (ref 22–32)

## 2022-03-23 LAB — RESP PANEL BY RT-PCR (RSV, FLU A&B, COVID)  RVPGX2
Influenza A by PCR: NEGATIVE
Influenza B by PCR: NEGATIVE
Resp Syncytial Virus by PCR: NEGATIVE
SARS Coronavirus 2 by RT PCR: NEGATIVE

## 2022-03-23 LAB — LIPASE, BLOOD: Lipase: 35 U/L (ref 11–51)

## 2022-03-23 NOTE — ED Triage Notes (Signed)
Pt bib EMS coming from home. Pt reports waking up feeling unwell. Around 1500 she developed fever (101.50f) and family gave 1g tylenol. Pt reports cough, intermittent dizziness and SOB. Family states pt has been c/o mild abdominal pain today.  Additionally pt self caths urine but has not been c/o urinary symptoms.

## 2022-03-23 NOTE — Telephone Encounter (Addendum)
Patient daughter is calling states patient has been having a fever of 101.3 and having chills, states there is regular BM and no complaints of pain but is looking for recommendations. Please advise. Daughter Carmel [place]

## 2022-03-23 NOTE — ED Notes (Signed)
Pt back from imaging at this time.

## 2022-03-23 NOTE — Telephone Encounter (Signed)
Spoke with pts daughter and she is aware and will take her mother to the ER to be evaluated.

## 2022-03-23 NOTE — ED Provider Notes (Signed)
[facility] Provider Note   CSN: [id number] Arrival date & time: 03/23/22  1833     History {Add pertinent medical, surgical, social history, OB history to HPI:1} Chief Complaint  Patient presents with   Fever    Julie Silva is a 89 y.o. female.  Today had fever of 101.79F headache taken axillary also abdominal pain recently treated for diverticulitis with last dose of antibiotics on the sixth       Home Medications Prior to Admission medications   Medication Sig Start Date End Date Taking? Authorizing Provider  acetaminophen (TYLENOL) 500 MG tablet Take 500 mg by mouth 2 (two) times daily.    [provider]  apixaban (ELIQUIS) 2.5 MG TABS tablet Take 1 tablet (2.5 mg total) by mouth 2 (two) times daily. 03/16/22   Janith Lima, MD  bimatoprost (LUMIGAN) 0.01 % SOLN Place 1 drop into both eyes at bedtime.    [provider]  Catheters MISC Replace catheter twice daily. 07/12/17   Janith Lima, MD  dorzolamide-timolol (COSOPT) 22.3-6.8 MG/ML ophthalmic solution Place 1 drop into both eyes 2 (two) times daily.    [provider]  dronabinol (MARINOL) 5 MG capsule TAKE 1 CAPSULE BY MOUTH TWICE A DAY BEFORE MEALS FOR APPETITE (100 X 2.5 MG ON OCT 5) 03/08/22   Janith Lima, MD  estradiol (ESTRACE) 0.1 MG/GM vaginal cream Place 1 Applicatorful vaginally every other day.    [provider]  gabapentin (NEURONTIN) 100 MG capsule Take 3 capsules (300 mg total) by mouth 2 (two) times daily. 03/16/22   Janith Lima, MD  guaifenesin (ROBITUSSIN) 100 MG/5ML syrup Take 100 mg by mouth 3 (three) times daily.    [provider]  Melatonin 1 MG CHEW Chew 1 tablet by mouth as needed (sleep).    [provider]  omeprazole (PRILOSEC) 20 MG capsule TAKE 1 CAPSULE BY MOUTH EVERY DAY Patient taking differently: Take 20 mg by mouth daily. TAKE 1 CAPSULE BY MOUTH EVERY DAY 10/26/21   Janith Lima, MD  ondansetron (ZOFRAN-ODT) 4 MG disintegrating tablet Take 1 tablet (4 mg total) by mouth every 8 (eight) hours as needed for nausea or vomiting. 02/28/22   Amin, Jeanella Flattery, MD  prochlorperazine (COMPAZINE) 10 MG tablet Take 10 mg by mouth every 4 (four) hours as needed for nausea or vomiting. 01/19/22   [provider]  sodium chloride HYPERTONIC 3 % nebulizer solution Take by nebulization 2 (two) times daily as needed for other. 06/24/21   Martyn Ehrich, NP  Wheat Dextrin (BENEFIBER PO) Take 5 mLs by mouth 2 (two) times daily.    [provider]      Allergies    Codeine and Colchicine    Review of Systems   Review of Systems  Physical Exam Updated Vital Signs BP 108/63   Pulse 87   Temp 98.5 F (36.9 C) (Oral)   Resp (!) 22   SpO2 96%  Physical Exam  ED Results / Procedures / Treatments   Labs (all labs ordered are listed, but only abnormal results are displayed) Labs Reviewed  COMPREHENSIVE METABOLIC PANEL - Abnormal; Notable for the following components:      Result Value   Glucose, Bld 115 (*)    BUN 33 (*)    Calcium 8.8 (*)    Albumin 3.0 (*)    GFR, Estimated 58 (*)    All other components within  normal limits  CBC WITH DIFFERENTIAL/PLATELET - Abnormal; Notable for the following components:   WBC 11.0 (*)    RBC 3.74 (*)    Hemoglobin 10.9 (*)    HCT 34.4 (*)    Neutro Abs 8.3 (*)    Monocytes Absolute 1.2 (*)    Abs Immature Granulocytes 0.08 (*)    All other components within normal limits  I-STAT CHEM 8, ED - Abnormal; Notable for the following components:   BUN 31 (*)    Glucose, Bld 117 (*)    Calcium, Ion 1.10 (*)    TCO2 21 (*)    Hemoglobin 10.9 (*)    HCT 32.0 (*)    All other components within normal limits  CULTURE, BLOOD (ROUTINE X 2)  RESP PANEL BY RT-PCR (RSV, FLU A&B, COVID)  [id number]  CULTURE, BLOOD (ROUTINE X 2)  LIPASE, BLOOD  URINALYSIS, ROUTINE W REFLEX MICROSCOPIC    EKG None  Radiology DG  Chest 2 View  Result Date: 03/23/2022 CLINICAL DATA:  Fever and cough. EXAM: CHEST - 2 VIEW COMPARISON:  Chest radiograph dated 02/24/2022. FINDINGS: Chronic right lung base infiltrate similar to the prior radiograph and CT of 12/15/2021. No new consolidation. There is no pleural effusion or pneumothorax. Stable cardiac silhouette. A pacer is again noted. No acute osseous pathology. IMPRESSION: 1. No acute cardiopulmonary process. 2. Chronic right lung base infiltrate. Electronically Signed   By: Anner Crete M.D.   On: 03/23/2022 19:27    Procedures Procedures  {Document cardiac monitor, telemetry assessment procedure when appropriate:1}  Medications Ordered in ED Medications - No data to display  ED Course/ Medical Decision Making/ A&P Clinical Course as of 03/23/22 2257  Wed Mar 23, 2022  1932 Chest x-ray shows chronic right lower lobe infiltrate [RP]    Clinical Course User Index [RP] Fransico Meadow, MD   {   Click here for ABCD2, HEART and other calculatorsREFRESH Note before signing :1}                          Medical Decision Making Amount and/or Complexity of Data Reviewed Labs: ordered. Radiology: ordered.   ***  {Document critical care time when appropriate:1} {Document review of labs and clinical decision tools ie heart score, Chads2Vasc2 etc:1}  {Document your independent review of radiology images, and any outside records:1} {Document your discussion with family members, caretakers, and with consultants:1} {Document social determinants of health affecting pt's care:1} {Document your decision making why or why not admission, treatments were needed:1} Final Clinical Impression(s) / ED Diagnoses Final diagnoses:  None    Rx / DC Orders ED Discharge Orders     None

## 2022-03-23 NOTE — Telephone Encounter (Signed)
Pyrtle pt, daughter calling stating her mother does not seem quite herself today. She had a temp of 101.3 and chills. She was recently in the hospital with diverticulitis and a microperforation. She has had a couple of formed stools today. She was seen by her PCP a week ago and states she had a small amount of discomfort in the LLQ upon exam. Daughter is concerned and wanted to know if she needs to be started on an antibiotic. Please advise as DOD.

## 2022-03-23 NOTE — ED Notes (Signed)
Patient transported to X-ray 

## 2022-03-23 NOTE — ED Notes (Signed)
Pt performed self-cath for urine sample.  Pt self-caths at home as well.

## 2022-03-24 ENCOUNTER — Emergency Department (HOSPITAL_COMMUNITY): Payer: Medicare Other

## 2022-03-24 ENCOUNTER — Other Ambulatory Visit: Payer: Self-pay

## 2022-03-24 DIAGNOSIS — J479 Bronchiectasis, uncomplicated: Secondary | ICD-10-CM | POA: Diagnosis not present

## 2022-03-24 DIAGNOSIS — K5730 Diverticulosis of large intestine without perforation or abscess without bleeding: Secondary | ICD-10-CM | POA: Diagnosis not present

## 2022-03-24 DIAGNOSIS — N281 Cyst of kidney, acquired: Secondary | ICD-10-CM | POA: Diagnosis not present

## 2022-03-24 DIAGNOSIS — N390 Urinary tract infection, site not specified: Secondary | ICD-10-CM | POA: Diagnosis not present

## 2022-03-24 DIAGNOSIS — R918 Other nonspecific abnormal finding of lung field: Secondary | ICD-10-CM | POA: Diagnosis not present

## 2022-03-24 MED ORDER — SODIUM CHLORIDE 0.9 % IV SOLN
1.0000 g | Freq: Once | INTRAVENOUS | Status: DC
  Filled 2022-03-24: qty 10

## 2022-03-24 MED ORDER — ACETAMINOPHEN 500 MG PO TABS
1000.0000 mg | ORAL_TABLET | ORAL | Status: AC
  Administered 2022-03-24: 1000 mg via ORAL
  Filled 2022-03-24: qty 2

## 2022-03-24 MED ORDER — METRONIDAZOLE 500 MG PO TABS
500.0000 mg | ORAL_TABLET | ORAL | Status: AC
  Administered 2022-03-24: 500 mg via ORAL
  Filled 2022-03-24: qty 1

## 2022-03-24 MED ORDER — CIPROFLOXACIN HCL 500 MG PO TABS: 500.0000 mg | ORAL_TABLET | Freq: Two times a day (BID) | ORAL | 0 refills | Status: AC

## 2022-03-24 MED ORDER — ONDANSETRON HCL 4 MG/2ML IJ SOLN
4.0000 mg | Freq: Once | INTRAMUSCULAR | Status: AC
  Administered 2022-03-24: 4 mg via INTRAVENOUS
  Filled 2022-03-24: qty 2

## 2022-03-24 MED ORDER — CIPROFLOXACIN HCL 500 MG PO TABS
500.0000 mg | ORAL_TABLET | ORAL | Status: AC
  Administered 2022-03-24: 500 mg via ORAL
  Filled 2022-03-24: qty 1

## 2022-03-24 MED ORDER — IOHEXOL 350 MG/ML SOLN
75.0000 mL | Freq: Once | INTRAVENOUS | Status: AC | PRN
  Administered 2022-03-24: 75 mL via INTRAVENOUS

## 2022-03-24 MED ORDER — METRONIDAZOLE 500 MG PO TABS: 500.0000 mg | ORAL_TABLET | Freq: Two times a day (BID) | ORAL | 0 refills | Status: AC

## 2022-03-24 NOTE — ED Notes (Signed)
Pt nauseous at this time; provider notified and aware.

## 2022-03-24 NOTE — ED Notes (Signed)
Pt/family provided with AVS.  Education complete; all questions answered.  Pt provided with Sprite for discharge due to ongoing nausea.  This RN explained return criteria to pt.  Pt leaving ED in stable condition at this time, via wheelchair with all belongings.  This RN took pt to ED entrance and assisted pt with getting into family's vehicle for discharge.

## 2022-03-24 NOTE — ED Notes (Signed)
Pt remains nauseous at this time; concerned with going home.  This RN told pt she could get more rest and give the medications time to kick in and we would re-assess to see if she feels better.

## 2022-03-24 NOTE — Discharge Instructions (Signed)
You were seen for your diverticulitis and possible urinary tract infection in the emergency department.   At home, please take the ciprofloxacin and Flagyl and we have prescribed you to treat your infection.    Follow-up with your primary doctor in 2-3 days regarding your visit.    Return immediately to the emergency department if you experience any of the following: Persistently high fevers, severe abdominal pain, or any other concerning symptoms.    Thank you for visiting our Emergency Department. It was a pleasure taking care of you today.

## 2022-03-24 NOTE — ED Notes (Signed)
Pt ambulatory to restroom and states she wants to try going home.  This RN to remove IV and prepare pt for discharge.

## 2022-03-24 NOTE — ED Notes (Signed)
Pt back from CT at this time 

## 2022-03-25 ENCOUNTER — Telehealth: Payer: Self-pay | Admitting: Nurse Practitioner

## 2022-03-25 NOTE — Telephone Encounter (Signed)
Patient's daughter is returning Steven's call

## 2022-03-25 NOTE — Telephone Encounter (Signed)
Left message for Pt daughter Rosiland Oz to call back

## 2022-03-25 NOTE — Telephone Encounter (Signed)
PT daughter wants doctor to review information from CT scan done yesterday. She wants to speak with someone about her symptoms. Please advise.

## 2022-03-28 LAB — CULTURE, BLOOD (ROUTINE X 2)
Culture: NO GROWTH
Culture: NO GROWTH
Special Requests: ADEQUATE

## 2022-03-28 NOTE — Telephone Encounter (Signed)
Dr. Hilarie Fredrickson, I called the patient's daughter [name]) and she verified her mother is not experiencing any abdominal pain today.  She will finish the 5-day course of Cipro and Flagyl tomorrow, I advised the patient to take Zofran 30 minutes prior to each dose.  Please review the CTAP that was done in the ED 3/20 which showed possible mild diverticulitis but no longer showed possible evidence of a microperforation.  She had an axillary temperature of 100.9F today and I advised daughter to contact PCP Dr. Ronnald Ramp to further review her chest CT and to rule out any non-GI etiology for her fever.  Her daughter agreed to contact Dr. Ronnald Ramp.  Her daughter will contact me tomorrow with further update.  Patient to go back to the ED if she develops high fever or severe abdominal pain.

## 2022-03-28 NOTE — Telephone Encounter (Signed)
Pt daughter [name] stated that her mom still has a temp and is experiencing nausea from the Medications: Pt was recently in the ED  and was diagnosed with Diverticulitis and UTI. Treated with Cipro and Flagyl for 5 days. [name] stated that pt was not nausea until starting receiving meds in the Hospital. Pt having Nausea and Vomiting from medication. Pt has this evening dose along with 2 doses tomorrow. Taking zofran with medication.  Pt questions if she she continue the antibiotics in regard to the N/V that pt is having and also concerned that she still has temp. Temp taking today 100.4 Auxiliary. Please review and advise

## 2022-03-29 ENCOUNTER — Telehealth: Payer: Self-pay | Admitting: Internal Medicine

## 2022-03-29 NOTE — Telephone Encounter (Signed)
Daughter called in stating that she think with these 2 medication isn't working pt she is still having a fever   metroNIDAZOLE (FLAGYL) 500 MG tablet   ciprofloxacin (CIPRO) 500 MG tablet   Pt was told from another Dr that they don't think the fever is coming from the diagnosis from Dr. Ronnald Ramp recommended,other Dr recommend maybe Dr Ronnald Ramp need to evaluate further  Pt always wants a phone call back about results on her urine lab   Best call back # [phone]

## 2022-03-31 ENCOUNTER — Telehealth: Payer: Self-pay | Admitting: *Deleted

## 2022-03-31 ENCOUNTER — Encounter: Payer: Self-pay | Admitting: Internal Medicine

## 2022-03-31 ENCOUNTER — Encounter: Payer: Self-pay | Admitting: Pulmonary Disease

## 2022-03-31 DIAGNOSIS — R509 Fever, unspecified: Secondary | ICD-10-CM

## 2022-03-31 MED ORDER — AMOXICILLIN-POT CLAVULANATE 250-62.5 MG/5ML PO SUSR: 875.0000 mg | Freq: Two times a day (BID) | ORAL | 0 refills | Status: AC

## 2022-03-31 NOTE — Telephone Encounter (Signed)
I called and discussed with Pat.  Her CT scan does not show any new infiltrates but shows chronic inflammatory changes.  UA in the ED was positive but she has chronic bacteria in her urine due to In-N-Out cath.  Urine culture was not sent  Since she is continuing to have fevers I have sent in a prescription for Augmentin and ordered a urine culture She will come to our clinic tomorrow to drop off a specimen for urine culture.

## 2022-03-31 NOTE — Telephone Encounter (Signed)
Mychart message converted to phone call:   Hi Dr. Vaughan Browner,  My mother was seen in the ED at [facility] on 3/20 due to fever and abdominal discomfort. Her GI doctor advised her to go to the ED for CT and eval.  (She had a Feb. 2024 hospitalization for diverticulitis and micro bowel perforation, treated with 4-5 days of bowel rest and IV Piperacillin.) The CT on 3/20 showed return of mild diverticulitis and possible beginnings of pneumonia. She was sent home on a 5-day course of Flagyl and Cipro - final dose was 3/26.    She's had a low-grade fever all week when not on Tylenol. Temp today was 100.4 at 7:15am. I spoke with the NP at the GI office, they suspect the fever is coming from something other than diverticulitis and recommended we contact you for lung evaluation.     I've requested a urine culture from her PCP but haven't heard back yet. She denies UTI symptoms.  She feels so bad. No appetite, weak, dizzy, and malaise.    I'm really concerned that we're coming up on the weekend and would like to keep her out of the ED if possible. We appreciate any direction you can provide.    Thank you,  Maureen Chatters and spoke with [name], she does have a cough, she is taking guaifenesin and doing the saline nebulizer solution.  She has mucous that varies from thin to thick.   Oxygen level has been 95-97%, sob is at her baseline.  She has had some confusion, but not anymore than usual.  She has had looser stool, but not liquid diarrhea, states this is under control.  Denies any congestion in chest or head.  Has post nasal drip.  She has had a fever all week even on the antibiotics.  The NP from the GI office recommended contacting us, they do not believe this is GI, there was something on her CT scan done in the ED that was of concern.  Dr. Vaughan Browner, please advise.  Thank you.

## 2022-04-20 DIAGNOSIS — H353132 Nonexudative age-related macular degeneration, bilateral, intermediate dry stage: Secondary | ICD-10-CM | POA: Diagnosis not present

## 2022-04-20 DIAGNOSIS — M3501 Sicca syndrome with keratoconjunctivitis: Secondary | ICD-10-CM | POA: Diagnosis not present

## 2022-04-20 DIAGNOSIS — H401132 Primary open-angle glaucoma, bilateral, moderate stage: Secondary | ICD-10-CM | POA: Diagnosis not present

## 2022-04-29 ENCOUNTER — Telehealth: Payer: Self-pay

## 2022-04-29 NOTE — Telephone Encounter (Signed)
PA initiated via Covermymeds; KEY: [id number]   [employer] was not able to process the request because the previous Prior Authorization Request was Denied, please submit an electronic Appeal Request. You can also contact the plan at [phone] or fax in request to [phone].  Medicare doesn't cover weight related, including meds for anorexia/weight loss.

## 2022-05-03 ENCOUNTER — Other Ambulatory Visit: Payer: Self-pay | Admitting: Internal Medicine

## 2022-05-03 DIAGNOSIS — R64 Cachexia: Secondary | ICD-10-CM

## 2022-05-03 DIAGNOSIS — J449 Chronic obstructive pulmonary disease, unspecified: Secondary | ICD-10-CM

## 2022-05-27 ENCOUNTER — Encounter: Payer: Self-pay | Admitting: Internal Medicine

## 2022-05-27 ENCOUNTER — Other Ambulatory Visit: Payer: Medicare Other

## 2022-05-27 DIAGNOSIS — R509 Fever, unspecified: Secondary | ICD-10-CM

## 2022-05-27 NOTE — Telephone Encounter (Signed)
Yes, she can bring a UA

## 2022-05-27 NOTE — Telephone Encounter (Signed)
Pt called stating she is on the way with the urine sample.

## 2022-05-28 LAB — URINE CULTURE: Result:: NO GROWTH

## 2022-06-01 NOTE — Telephone Encounter (Signed)
Alcide Evener NP/ Dr. Rhea Belton Pt Please review note from Pt. Please advise as DOD Spoke with [name] Pts daughter. Pt is having generalized abdominal pain. Tender. Diarrhea some last couple of days,  Low grade temp.  Urine culture negative.  History of Diverticulitis.  Please advise as DOD.

## 2022-06-02 NOTE — Telephone Encounter (Signed)
Spoke with patient's daughter regarding MD recommendations. She stated patient's symptoms are still about the same, however no fever today so far. Recent bowel movement was normal. We currently do not have any APP appointments available as of now, however there is a 7 day hold that will open for triage spots starting next week. She's aware that I will call her on Monday to see if we can schedule an appointment then or if a cancellation becomes available sooner; however in the meantime she needs to reach out to PCP office or be seen at ED for quicker evaluation. She asked that Dr. Rhea Belton be made aware since he is her primary doctor & if he recommends antibiotics or CT. Advised her that he is currently out of office. Will route to MD to review when he returns tomorrow. Again, advised on importance of sooner evaluation. Daughter verbalized all understanding.

## 2022-06-03 NOTE — Telephone Encounter (Signed)
Left detailed message for patient's daughter. An appointment is available for Monday, 6/3 at 1:30 pm with Jill Side, NP. Scheduled patient tentatively & advised daughter to call back and confirm appointment.

## 2022-06-03 NOTE — Telephone Encounter (Signed)
Patient to be seen Monday which I agree with

## 2022-06-06 ENCOUNTER — Ambulatory Visit: Payer: Medicare Other

## 2022-06-06 ENCOUNTER — Other Ambulatory Visit (INDEPENDENT_AMBULATORY_CARE_PROVIDER_SITE_OTHER): Payer: Medicare Other

## 2022-06-06 ENCOUNTER — Ambulatory Visit (INDEPENDENT_AMBULATORY_CARE_PROVIDER_SITE_OTHER): Payer: Medicare Other | Admitting: Nurse Practitioner

## 2022-06-06 ENCOUNTER — Encounter: Payer: Self-pay | Admitting: Nurse Practitioner

## 2022-06-06 VITALS — BP 122/50 | HR 88 | Ht 64.0 in | Wt 117.2 lb

## 2022-06-06 DIAGNOSIS — Z8719 Personal history of other diseases of the digestive system: Secondary | ICD-10-CM | POA: Diagnosis not present

## 2022-06-06 DIAGNOSIS — R197 Diarrhea, unspecified: Secondary | ICD-10-CM | POA: Diagnosis not present

## 2022-06-06 DIAGNOSIS — R1032 Left lower quadrant pain: Secondary | ICD-10-CM

## 2022-06-06 LAB — COMPREHENSIVE METABOLIC PANEL
ALT: 11 U/L (ref 0–35)
AST: 14 U/L (ref 0–37)
Albumin: 3.9 g/dL (ref 3.5–5.2)
Alkaline Phosphatase: 98 U/L (ref 39–117)
BUN: 39 mg/dL — ABNORMAL HIGH (ref 6–23)
CO2: 24 mEq/L (ref 19–32)
Calcium: 9.4 mg/dL (ref 8.4–10.5)
Chloride: 103 mEq/L (ref 96–112)
Creatinine, Ser: 0.85 mg/dL (ref 0.40–1.20)
GFR: 59.24 mL/min — ABNORMAL LOW (ref >60.00)
Glucose, Bld: 102 mg/dL — ABNORMAL HIGH (ref 70–99)
Potassium: 4.1 mEq/L (ref 3.5–5.1)
Sodium: 137 mEq/L (ref 135–145)
Total Bilirubin: 0.3 mg/dL (ref 0.2–1.2)
Total Protein: 8.2 g/dL (ref 6.0–8.3)

## 2022-06-06 LAB — CBC WITH DIFFERENTIAL/PLATELET
Basophils Absolute: 0 10*3/uL (ref 0.0–0.1)
Basophils Relative: 0.4 % (ref 0.0–3.0)
Eosinophils Absolute: 0.2 10*3/uL (ref 0.0–0.7)
Eosinophils Relative: 2.6 % (ref 0.0–5.0)
HCT: 36.1 % (ref 36.0–46.0)
Hemoglobin: 11.3 g/dL — ABNORMAL LOW (ref 12.0–15.0)
Lymphocytes Relative: 17.3 % (ref 12.0–46.0)
Lymphs Abs: 1.5 10*3/uL (ref 0.7–4.0)
MCHC: 31.3 g/dL (ref 30.0–36.0)
MCV: 91.3 fl (ref 78.0–100.0)
Monocytes Absolute: 0.8 10*3/uL (ref 0.1–1.0)
Monocytes Relative: 9.4 % (ref 3.0–12.0)
Neutro Abs: 6.2 10*3/uL (ref 1.4–7.7)
Neutrophils Relative %: 70.3 % (ref 43.0–77.0)
Platelets: 395 10*3/uL (ref 150.0–400.0)
RBC: 3.95 Mil/uL (ref 3.87–5.11)
RDW: 14.7 % (ref 11.5–15.5)
WBC: 8.8 10*3/uL (ref 4.0–10.5)

## 2022-06-06 NOTE — Progress Notes (Signed)
06/06/2022 Julie Silva [id number] May 03, 1927   Chief Complaint: LLQ pain  History of Present Illness: Julie Silva is a 89 year old female with a past medical history of atrial fibrillation on Eliquis, sick sinus syndrome s/p pacemaker placement, bronchiectasis with underlying MAI, CKD stage IIIA, recurrent UTIs on chronic Ceftin, pancreatic cysts, choledocholithiasis status post ERCP 04/2009, small hiatal hernia, GERD, intermittent overflow diarrhea and diverticulitis with a microperforation. She is known by Dr. Rhea Belton.   She presents today accompanied by her daughter for further evaluation regarding left lower quadrant abdominal pain which started 2 weeks ago with onset of nonbloody watery diarrhea for the past week.  Temp 99.6 - 100.8 intermittently at home over the past week.  She vomited nonbloody partially digested food x 1 episode within the past week. She stated her current abdominal pain is similar to the abdominal pain she experienced when she was admitted to the hospital 02/23/2022 with acute diverticulitis with a microperforation treated with IV antibiotics then Augmentin x 2 weeks.   She had at least 1 colonoscopy in her lifetime which was at least 20 years ago, results unknown. No known family history of colorectal cancer.       Latest Ref Rng & Units 03/23/2022    7:06 PM 03/23/2022    6:00 PM 03/08/2022    3:34 PM  CBC  WBC 4.0 - 10.5 K/uL  11.0  7.1   Hemoglobin 12.0 - 15.0 g/dL 19.1  47.8  29.5   Hematocrit 36.0 - 46.0 % 32.0  34.4  34.4   Platelets 150 - 400 K/uL  314  483.0        Latest Ref Rng & Units 03/23/2022    7:06 PM 03/23/2022    6:00 PM 03/08/2022    3:34 PM  CMP  Glucose 70 - 99 mg/dL 621  308  98   BUN 8 - 23 mg/dL 31  33  31   Creatinine 0.44 - 1.00 mg/dL 6.57  8.46  9.62   Sodium 135 - 145 mmol/L 138  135  139   Potassium 3.5 - 5.1 mmol/L 4.4  4.3  4.3   Chloride 98 - 111 mmol/L 106  103  101   CO2 22 - 32 mmol/L  22  29   Calcium 8.9 - 10.3 mg/dL   8.8  9.9   Total Protein 6.5 - 8.1 g/dL  7.3  7.7   Total Bilirubin 0.3 - 1.2 mg/dL  0.6  0.2   Alkaline Phos 38 - 126 U/L  90  90   AST 15 - 41 U/L  19  16   ALT 0 - 44 U/L  11  13     CTAP 03/24/2022: IMPRESSION: Extensive clustered nodular airspace disease in the right lower lobe is stable when compared to prior study, somewhat improved when compared to 06/10/2021. Differential considerations would include occlude infection or malignancy, but stability over time makes malignancy less likely. Stable clustered nodular opacities also seen in the superior segment of the left lower lobe, likely infectious/inflammatory.   Extensive left colonic diverticulosis. Slight stranding around the distal descending colon and proximal sigmoid colon suggesting early active diverticulitis.   Stable cystic areas within the pancreas. Differential includes IPMN or inflammatory cysts.  CTAP 03/09/2022: 1. Improved appearance of diverticulitis at the junction of the descending and sigmoid colon. There continues to be a small less than 1 cc collection of gas along the colon wall in this vicinity with a  small amount of surrounding inflammatory stranding, this could represent a small residual extraluminal collection at the level of the previous presumed extraluminal gas, or may conceivably be within a diverticulum. 2. Extensive but stable clustered nodularity with some consolidation in the right lower lobe, possibilities include atypical pneumonia or local malignancy, correlate with any prior workup. 3. Small cystic lesions along the pancreatic parenchyma, largest exophytic extending caudad from the pancreatic body and measuring 1.8 by 1.1 cm, previously 1.5 by 1.1 cm by my measurements (difference in size attributable to different orientation). Possibilities include intraductal papillary mucinous neoplasm or postinflammatory cystic lesion. Cystic lesions have been noted in the pancreas since at least  2011. In light of the patient's age, follow up pancreatic protocol imaging is considered optional, but if desired, consider pancreatic protocol CT or MRI in 2 years time. 4. Small cystocele extends just below the pubococcygeal line. 5. Stable vaginal debris and prior vaginopexy, correlate with any vaginal discharge. 6. Mild left foraminal stenosis at L5-S1 due to disc bulge and facet arthropathy. 7. Aortic atherosclerosis.  CTAP 02/23/2022: Colonic diverticulosis with an area of inflammatory stranding and wall thickening along the mid descending colon consistent with an area of diverticulitis. Few bubbles of air immediately adjacent to the bowel wall posteriorly in this location could represent microperforation with early phlegmonous change. No widespread free air or obstruction. Recommend follow-up to confirm clearance.   Stable changes of vaginal pexy with gas and fluid in the vagina extending up to the presacral region with some stranding. Please correlate with any prior workup.   Stable cystic lesions along the midbody of the pancreas. In light of the patient's age simple follow up in 2 years could be performed.   Masslike opacities in the right lower lobe of the lung as on prior CT of the chest from December 2023.  Current Outpatient Medications on File Prior to Visit  Medication Sig Dispense Refill   acetaminophen (TYLENOL) 500 MG tablet Take 500 mg by mouth 2 (two) times daily.     apixaban (ELIQUIS) 2.5 MG TABS tablet Take 1 tablet (2.5 mg total) by mouth 2 (two) times daily. 180 tablet 1   bimatoprost (LUMIGAN) 0.01 % SOLN Place 1 drop into both eyes at bedtime.     Catheters MISC Replace catheter twice daily. 180 each 1   cefdinir (OMNICEF) 250 MG/5ML suspension Take 250 mg by mouth daily.     dorzolamide-timolol (COSOPT) 22.3-6.8 MG/ML ophthalmic solution Place 1 drop into both eyes 2 (two) times daily.     dronabinol (MARINOL) 5 MG capsule TAKE 1 CAPSULE BY MOUTH TWICE  DAILY BEFORE A MEAL FOR  APPETITE 60 capsule 2   estradiol (ESTRACE) 0.1 MG/GM vaginal cream Place 1 Applicatorful vaginally every other day.     gabapentin (NEURONTIN) 100 MG capsule Take 3 capsules (300 mg total) by mouth 2 (two) times daily. 180 capsule 1   guaifenesin (ROBITUSSIN) 100 MG/5ML syrup Take 100 mg by mouth 3 (three) times daily.     Melatonin 1 MG CHEW Chew 1 tablet by mouth as needed (sleep).     Multiple Vitamins-Minerals (PRESERVISION AREDS 2) CAPS Take 1 tablet by mouth 2 (two) times daily.     omeprazole (PRILOSEC) 20 MG capsule TAKE 1 CAPSULE BY MOUTH EVERY DAY (Patient taking differently: Take 20 mg by mouth daily. TAKE 1 CAPSULE BY MOUTH EVERY DAY) 90 capsule 1   ondansetron (ZOFRAN-ODT) 4 MG disintegrating tablet Take 1 tablet (4 mg total) by mouth every 8 (  eight) hours as needed for nausea or vomiting. 30 tablet 0   prochlorperazine (COMPAZINE) 10 MG tablet Take 10 mg by mouth every 4 (four) hours as needed for nausea or vomiting.     sodium chloride HYPERTONIC 3 % nebulizer solution Take by nebulization 2 (two) times daily as needed for other. 750 mL 12   Wheat Dextrin (BENEFIBER PO) Take 5 mLs by mouth 2 (two) times daily.     No current facility-administered medications on file prior to visit.   Allergies  Allergen Reactions   Codeine Other (See Comments)    Passed out   Colchicine Diarrhea    Current Medications, Allergies, Past Medical History, Past Surgical History, Family History and Social History were reviewed in [employer] record.  Review of Systems:   Constitutional: Negative for fever, sweats, chills or weight loss.  Respiratory: Negative for shortness of breath.   Cardiovascular: Negative for chest pain, palpitations and leg swelling.  Gastrointestinal: See HPI.  Musculoskeletal: Negative for back pain or muscle aches.  Neurological: Negative for dizziness, headaches or paresthesias.    Physical Exam: Ht 5\' 4"  (1.626 m)    Wt 117 lb 4 oz (53.2 kg)   BMI 20.13 kg/m  Weight stable on Marinol twice daily Wt Readings from Last 3 Encounters:  06/06/22 117 lb 4 oz (53.2 kg)  03/16/22 117 lb (53.1 kg)  03/08/22 114 lb (51.7 kg)    General: 89 year old female in no acute distress. Head: Normocephalic and atraumatic. Eyes: No scleral icterus. Conjunctiva pink . Ears: Normal auditory acuity. Mouth: Upper and lower dentures.  No ulcers or lesions.  Lungs: Coarse crackles to the right mid lobe, decreased in the bases bilaterally. Heart: Regular rate and rhythm, no murmur. Abdomen: Soft, nondistended.  Moderate tenderness to the LLQ without rebound or guarding.  Mild central abdominal tenderness without rebound or guarding.  No masses or hepatomegaly. Normal bowel sounds x 4 quadrants.  Rectal: Deferred. Musculoskeletal: Symmetrical with no gross deformities. Extremities: No edema. Neurological: Alert oriented x 4. No focal deficits.  Psychological: Alert and cooperative. Normal mood and affect  Assessment and Recommendations:  89 year old female with recurrent left lower quadrant abdominal pain x 2 weeks and diarrhea x 1 week which are similar symptoms when diagnosed with diverticulitis and a suspected microperforation 02/2022. On chronic Cefdinir QD for chronic UTIs. -Diatherix C. difficile and stool culture -CBC, CMP -Stat CTAP to rule out diverticulitis, microperforation, abscess and colitis  -Florastor probiotic 1 p.o. twice daily -Stop fiber supplement for now -Fluids, bland diet -Patient go to the emergency room if abdominal pain worsens  Chronic normocytic anemia.  CKD a contributing factor. No overt GI bleeding.  History of A-fib on Eliquis

## 2022-06-06 NOTE — Progress Notes (Signed)
Addendum: Reviewed and agree with assessment and management plan. Jheremy Boger M, MD  

## 2022-06-06 NOTE — Patient Instructions (Signed)
Hold fiber supplement until further notice.  If you have any questions regarding your exam or if you need to reschedule, you may call [facility] at [phone] between the hours of 8:00 am and 5:00 pm, Monday-Friday.   Your provider has requested that you go to the basement level for lab work before leaving today. Press "B" on the elevator. The lab is located at the first door on the left as you exit the elevator.  Your provider has ordered "Diatherix" stool testing for you. You have received a kit from our office today containing all necessary supplies to complete this test. Please carefully read the stool collection instructions provided in the kit before opening the accompanying materials. In addition, be sure to place the label from the top left corner of the laboratory request sheet onto the "puritan opti-swab" tube that is supplied in the kit. This label should include your full name and date of birth. After completing the test, you should secure the purtian tube into the specimen biohazard bag. The laboratory request information sheet (including date and time of specimen collection) should be placed into the outside pocket of the specimen biohazard bag and returned to the [place] lab with 2 days of collection.   Florastor probiotic- 1 by mouth twice daily  Go to the ED if abdominal pain worsens.  Push fluids, bland diet.  Thank you for trusting me with your gastrointestinal care!   Alcide Evener, CRNP

## 2022-06-07 ENCOUNTER — Ambulatory Visit
Admission: RE | Admit: 2022-06-07 | Discharge: 2022-06-07 | Disposition: A | Discharge status: Home / Self Care | Payer: Medicare Other | Source: Ambulatory Visit | Attending: Nurse Practitioner | Admitting: Nurse Practitioner

## 2022-06-07 DIAGNOSIS — Z8719 Personal history of other diseases of the digestive system: Secondary | ICD-10-CM

## 2022-06-07 DIAGNOSIS — R1032 Left lower quadrant pain: Secondary | ICD-10-CM | POA: Diagnosis not present

## 2022-06-07 DIAGNOSIS — R197 Diarrhea, unspecified: Secondary | ICD-10-CM | POA: Diagnosis not present

## 2022-06-07 DIAGNOSIS — K5730 Diverticulosis of large intestine without perforation or abscess without bleeding: Secondary | ICD-10-CM | POA: Diagnosis not present

## 2022-06-07 MED ORDER — IOHEXOL 300 MG/ML  SOLN
75.0000 mL | Freq: Once | INTRAMUSCULAR | Status: AC | PRN
  Administered 2022-06-07: 75 mL via INTRAVENOUS

## 2022-06-08 ENCOUNTER — Telehealth: Payer: Self-pay | Admitting: Nurse Practitioner

## 2022-06-08 NOTE — Telephone Encounter (Signed)
Patient's daughter is returning your call 

## 2022-06-08 NOTE — Telephone Encounter (Signed)
Spoke with pt daughter [name].  Documented in result notes. Pat verbalized understanding with all questions answered.

## 2022-06-10 DIAGNOSIS — N952 Postmenopausal atrophic vaginitis: Secondary | ICD-10-CM | POA: Diagnosis not present

## 2022-06-10 DIAGNOSIS — N390 Urinary tract infection, site not specified: Secondary | ICD-10-CM | POA: Diagnosis not present

## 2022-06-10 DIAGNOSIS — T83711S Erosion of implanted vaginal mesh and other prosthetic materials to surrounding organ or tissue, sequela: Secondary | ICD-10-CM | POA: Diagnosis not present

## 2022-06-15 ENCOUNTER — Encounter: Payer: Self-pay | Admitting: Neurology

## 2022-06-15 ENCOUNTER — Encounter: Payer: Self-pay | Admitting: Oncology

## 2022-06-15 ENCOUNTER — Ambulatory Visit (INDEPENDENT_AMBULATORY_CARE_PROVIDER_SITE_OTHER): Payer: Medicare Other | Admitting: Neurology

## 2022-06-15 VITALS — BP 104/64 | HR 84 | Ht 64.0 in | Wt 119.0 lb

## 2022-06-15 DIAGNOSIS — Z515 Encounter for palliative care: Secondary | ICD-10-CM | POA: Diagnosis not present

## 2022-06-15 DIAGNOSIS — G44209 Tension-type headache, unspecified, not intractable: Secondary | ICD-10-CM | POA: Diagnosis not present

## 2022-06-15 DIAGNOSIS — R251 Tremor, unspecified: Secondary | ICD-10-CM

## 2022-06-15 MED ORDER — GABAPENTIN 300 MG PO CAPS: 300.0000 mg | ORAL_CAPSULE | Freq: Two times a day (BID) | ORAL | 3 refills | Status: DC

## 2022-06-15 NOTE — Progress Notes (Signed)
[facility]  PATIENT: Julie Silva DOB: 1927-02-11  REQUESTING CLINICIAN: Etta Grandchild, MD HISTORY FROM: Daughter  REASON FOR VISIT: Poor appetite, poor sleep, memory deficit, r/o dementia    HISTORICAL  CHIEF COMPLAINT:  Chief Complaint  Patient presents with   Follow-up    Rm 12, with daughter [name], f/u memory [facility], 2 ER visits for micro bowel perforation    INTERVAL HISTORY 06/15/2022:  Patient presents today for follow-up, she is accompanied by her daughter.  Last visit was 6 months ago.  Since then she has been doing ok.  She was seen in the ED twice for diverticulitis. She reports headaches has markedly improved since being on gabapentin 300 mg twice daily.  Sometimes she does not have occasional headaches but the frequency has improved.  Daughter reports that her memory is okay, sometimes patient is forgetful and lately daughter has noted confusion, patient believes that she owns a second house that look very similar to her current house with the same address.  Sometimes she asks to go to second home.  Daughter has been reorienting her but she said that sometimes it can cause agitation.  Sometime daughter will drive patient to her house and bring her back.  Denies any recent fall.  She also reports that her appetite has improved.  She eats small meals but it frequently.    INTERVAL HISTORY 12/09/2021:  Patient presents today for follow up, accompanied by her daughter [name].  Since last visit, we have started gabapentin for headache.  She reports that gabapentin has markedly improved her headaches.  Currently she is on gabapentin 100 mg in the morning and 200 mg at night, still continue to have occasional headaches but not as bad as before.  She reports that laying down in 1 position does cause her to have sharp pain on top and in the back of the head.  In terms of her memory she reported to be stable, does not have any new concerns.  She feels like her appetite  has increased a little bit she has gained some weight.   HISTORY OF PRESENT ILLNESS:  This is a 89 year old woman past medical history of atrial fibrillation, pelvic floor dysfunction with self-catheterization, history of multiple UTIs, history of poor oral intake and malnutrition, hearing loss, who is presenting for overall decline in the past year but worse in the past few months, and poor sleep.   In brief daughter reports patient have a history of multiple UTIs, history of poor p.o. intake and weight loss for many years.  Her symptoms have been getting worse for the past year and a half and acutely worse in the past 3 to 4 months after being admitted in the hospital for urinary tract infection.  Daughter reported that in February patient has rapid A-fib, which was controlled with amiodarone.  At that time also she was found to have a resistant UTI, admitted to hospital for IV antibiotic and discharged on cefdinir.  From there she also had lung infection, bronchiectasis and since discharge from the hospital her appetite has been very poor.  She is also very weak, a year ago she used to walk independently but now needs wheelchair or at least a walker.  During this time also she was noted to need supplemental oxygen, but her O2 requirement has lessened.  She still have the supplemental oxygen but she only needed as needed. In May, she also had another bout of UTI.   Daughter reports  that her sleep is poor, she goes to bed around 9/10 PM but will be restless at night, will toss and turn, sometimes will remove her oxygen machine.   She is also talking during her sleep and act out her dreams.  Some nights she will get up trying to leave the bedroom or some night, she does not know how to get to her own bedroom, thinking that the bedroom is upstairs where in fact there are no stairs in the house.  Due to her worsening dream, her mirtazapine was discontinued due to possible side effect REM sleep abnormal  behavior.  Currently she is not taking any appetite stimulant, in the past she tried mirtazapine and Megace. She was on mirtazapine for the past 4 years but lost of appetite is getting worse.   She did also try supplement like boost but currently not using it.    In terms of her memory, there is also concern of memory loss.  Patient lives with her 2 daughters who help assist her in her activities of daily living.  She currently is not driving, has not driven for the past 2 years.  She needs help with cooking, cleaning, bathing and toileting and also daughter is helping her bills.  She does not have any issue going to familiar places or difficult with names of family members. She also feels like her mood is low.     TBI:   No past history of TBI Stroke:    no past history of stroke Seizures:   no past history of seizures Sleep:   no history of sleep apnea.  Mood:   patient denies anxiety and depression  Functional status: Dependent in most ADLs and IADLs Patient lives with daughter.  Cooking: No Cleaning: No Shopping: No  Bathing: needs help  Toileting:  Patient  Driving: Not driving for the past 2 year  Bills: Daughter   Ever left the stove on by accident?: No Forget how to use items around the house?: No  Getting lost going to familiar places?: No  Forgetting loved ones names?: No  Word finding difficulty? Yes  Sleep: Poor, will get restless    OTHER MEDICAL CONDITIONS: recurrent UTIs, weight loss, Bronchiectasis, atrial fibrillation    REVIEW OF SYSTEMS: Full 14 system review of systems performed and negative with exception of: as noted in the HPI   ALLERGIES: Allergies  Allergen Reactions   Codeine Other (See Comments)    Passed out   Colchicine Diarrhea    HOME MEDICATIONS: Outpatient Medications Prior to Visit  Medication Sig Dispense Refill   acetaminophen (TYLENOL) 500 MG tablet Take 500 mg by mouth 2 (two) times daily.     apixaban (ELIQUIS) 2.5 MG TABS tablet  Take 1 tablet (2.5 mg total) by mouth 2 (two) times daily. 180 tablet 1   bimatoprost (LUMIGAN) 0.01 % SOLN Place 1 drop into both eyes at bedtime.     Catheters MISC Replace catheter twice daily. 180 each 1   cefdinir (OMNICEF) 250 MG/5ML suspension Take 250 mg by mouth daily.     dorzolamide-timolol (COSOPT) 22.3-6.8 MG/ML ophthalmic solution Place 1 drop into both eyes 2 (two) times daily.     dronabinol (MARINOL) 5 MG capsule TAKE 1 CAPSULE BY MOUTH TWICE DAILY BEFORE A MEAL FOR  APPETITE 60 capsule 2   estradiol (ESTRACE) 0.1 MG/GM vaginal cream Place 1 Applicatorful vaginally every other day.     guaifenesin (ROBITUSSIN) 100 MG/5ML syrup Take 200 mg by mouth  3 (three) times daily.     Melatonin 1 MG CHEW Chew 1 tablet by mouth as needed (sleep).     Multiple Vitamins-Minerals (PRESERVISION AREDS 2) CAPS Take 1 tablet by mouth 2 (two) times daily.     omeprazole (PRILOSEC) 20 MG capsule TAKE 1 CAPSULE BY MOUTH EVERY DAY (Patient taking differently: Take 20 mg by mouth daily. TAKE 1 CAPSULE BY MOUTH EVERY DAY) 90 capsule 1   ondansetron (ZOFRAN-ODT) 4 MG disintegrating tablet Take 1 tablet (4 mg total) by mouth every 8 (eight) hours as needed for nausea or vomiting. 30 tablet 0   Peppermint Oil (IBGARD PO) Take 2 tablets by mouth 2 (two) times daily.     prochlorperazine (COMPAZINE) 10 MG tablet Take 2.5 mg by mouth every 4 (four) hours as needed for nausea or vomiting.     sodium chloride HYPERTONIC 3 % nebulizer solution Take by nebulization 2 (two) times daily as needed for other. 750 mL 12   Wheat Dextrin (BENEFIBER PO) Take 5 mLs by mouth 2 (two) times daily.     gabapentin (NEURONTIN) 100 MG capsule Take 3 capsules (300 mg total) by mouth 2 (two) times daily. 180 capsule 1   No facility-administered medications prior to visit.    PAST MEDICAL HISTORY: Past Medical History:  Diagnosis Date   Anemia    Aortic atherosclerosis (HCC)    Arthritis    Osteoporosis, neck stiffness    Atrial fibrillation (HCC)    Bleeds easily [facility])    "father was a hemophiliac" - pt not being bothered in recent years.   CAD (coronary artery disease)    Calcium deposit in bursa of knee 2017   Cancer (HCC)    skin cancer "basal cell".   Chicken pox    Cyst, Baker's knee    left knee- tx. Cortisone injection 05-05-15 in office- "improved pain relief and swelling left ankle and knee"   Diverticulitis    Diverticulosis    Dysrhythmia    Dr. McAlhany-cardiology   GERD (gastroesophageal reflux disease)    Glaucoma of both eyes    Gout    Hearing loss of both ears    Bilateral ears- hearing aids used- right ear hearing betther than left.   Hiatal hernia    History of blood transfusion 1957   "lots; most related to my periods"-last 1968 -s/p Hysterectomy   Migraines    "years ago" (02/04/2013)   Motion sickness    back seat - cars, sudden movements   PAF (paroxysmal atrial fibrillation) (HCC)    Pancreatic cyst    Pelvic floor dysfunction    PONV (postoperative nausea and vomiting)    Risk for falls    "wobbley" per daughter   Self-catheterizes urinary bladder    "daily- retained urine and chronic UTI"   SVT (supraventricular tachycardia)    Dr. Reece Agar. Taylor-follows "loop recorder left chest"   Swallowing difficulty    freq occ. mostley liquids   Urine incontinence    UTI (urinary tract infection)    Chronic Urinary tract infections- tx Macrobid daily.   Vertigo    Wears hearing aid in both ears     PAST SURGICAL HISTORY: Past Surgical History:  Procedure Laterality Date   APPENDECTOMY  01/03/1946   BLADDER SURGERY  01/03/1993   Repair-    BREAST BIOPSY Bilateral    "both were fine"   CATARACT EXTRACTION W/ INTRAOCULAR LENS IMPLANT Left    CATARACT EXTRACTION W/PHACO Right 10/16/2018   Procedure:  CATARACT EXTRACTION PHACO AND INTRAOCULAR LENS PLACEMENT (IOC)  RIGHT MiLoop diabetes 01:05.8  21.0%  13.77;  Surgeon: Galen Manila, MD;  Location: [facility];   Service: Ophthalmology;  Laterality: Right;  BS tends to drop in AM and would like early surgery so she can get home to eat, please   CHOLECYSTECTOMY  01/04/2012   COMBINED HYSTERECTOMY ABDOMINAL W/ A&P REPAIR / OOPHORECTOMY     DILATION AND CURETTAGE OF UTERUS     EP IMPLANTABLE DEVICE N/A 06/19/2014   Procedure: Loop Recorder Insertion;  Surgeon: Marinus Maw, MD;  Location: [facility];  Service: Cardiovascular;  Laterality: N/A;   EXCISION OF BREAST BIOPSY Right 05/13/2015   Procedure: RIGHT BREAST EXCISIONAL BIOPSY x2;  Surgeon: Darnell Level, MD;  Location: [facility];  Service: General;  Laterality: Right;   PACEMAKER IMPLANT  02/2020   TONSILLECTOMY  01/04/1944   TOTAL ABDOMINAL HYSTERECTOMY     VAGINAL HYSTERECTOMY  01/03/1966   vaginal mesh      FAMILY HISTORY: Family History  Problem Relation Age of Onset   Stroke Father    CVA Father    Ovarian cancer Mother    Cancer - Other Sister        Breast   Breast cancer Sister    Cancer - Other Brother        Throat   Cancer - Other Sister        Throat   Brain cancer Sister    Cancer - Other Other        Brain   Breast cancer Other     SOCIAL HISTORY: Social History   Socioeconomic History   Marital status: Widowed    Spouse name: Not on file   Number of children: 4   Years of education: 12   Highest education level: Not on file  Occupational History   Occupation: Air cabin crew  Tobacco Use   Smoking status: Never    Passive exposure: Past   Smokeless tobacco: Never  Vaping Use   Vaping Use: Never used  Substance and Sexual Activity   Alcohol use: No    Alcohol/week: 0.0 standard drinks of alcohol   Drug use: No   Sexual activity: Never  Other Topics Concern   Not on file  Social History Narrative   Lives in her home an family stays with her.    Retired Diplomatic Services operational officer.     Has 4 daughters.    Right handed   Caffeine-none   Social Determinants of Health   Financial Resource Strain: Low  Risk  (10/26/2017)   Overall Financial Resource Strain (CARDIA)    Difficulty of Paying Living Expenses: Not hard at all  Food Insecurity: No Food Insecurity (03/03/2022)   Hunger Vital Sign    Worried About Running Out of Food in the Last Year: Never true    Ran Out of Food in the Last Year: Never true  Transportation Needs: No Transportation Needs (03/03/2022)   PRAPARE - Administrator, Civil Service (Medical): No    Lack of Transportation (Non-Medical): No  Physical Activity: Inactive (10/26/2017)   Exercise Vital Sign    Days of Exercise per Week: 0 days    Minutes of Exercise per Session: 0 min  Stress: No Stress Concern Present (10/26/2017)   [employer] of Occupational Health - Occupational Stress Questionnaire    Feeling of Stress : Not at all  Social Connections: Moderately Integrated (10/26/2017)   Social Connection and  Isolation Panel [NHANES]    Frequency of Communication with Friends and Family: More than three times a week    Frequency of Social Gatherings with Friends and Family: More than three times a week    Attends Religious Services: 1 to 4 times per year    Active Member of [employer] or Organizations: Yes    Attends Banker Meetings: 1 to 4 times per year    Marital Status: Widowed  Intimate Partner Violence: Not At Risk (02/24/2022)   Humiliation, Afraid, Rape, and Kick questionnaire    Fear of Current or Ex-Partner: No    Emotionally Abused: No    Physically Abused: No    Sexually Abused: No    PHYSICAL EXAM  GENERAL EXAM/CONSTITUTIONAL: Vitals:  Vitals:   06/15/22 1344  BP: 104/64  Pulse: 84  SpO2: 94%  Weight: 119 lb (54 kg)  Height: 5\' 4"  (1.626 m)    Body mass index is 20.43 kg/m. Wt Readings from Last 3 Encounters:  06/15/22 119 lb (54 kg)  06/06/22 117 lb 4 oz (53.2 kg)  03/16/22 117 lb (53.1 kg)   Patient is in no distress; well developed, nourished and groomed; neck is supple  MUSCULOSKELETAL: Gait,  strength, tone, movements noted in Neurologic exam below  NEUROLOGIC: MENTAL STATUS:     10/26/2017    3:27 PM  MMSE - Mini Mental State Exam  Not completed: Refused      06/15/2022    2:01 PM 12/09/2021    1:38 PM 06/07/2021    3:31 PM  Montreal Cognitive Assessment   Visuospatial/ Executive (0/5) 1 2 2   Naming (0/3) 2 2 2   Attention: Read list of digits (0/2) 2 2 2   Attention: Read list of letters (0/1) 1 1 0  Attention: Serial 7 subtraction starting at 100 (0/3) 1 3 0  Language: Repeat phrase (0/2) 0 2 2  Language : Fluency (0/1) 0 0 0  Abstraction (0/2) 1 1 2   Delayed Recall (0/5) 0 0 0  Orientation (0/6) 5 6 6   Total 13 19 16   Adjusted Score (based on education)  20   Able to spell PENCIL forward and backward  Very flat and blunted affect  CRANIAL NERVE:  2nd, 3rd, 4th, 6th - visual fields full to confrontation, extraocular muscles intact, no nystagmus 5th - facial sensation symmetric 7th - facial strength symmetric 8th - hearing intact 9th - palate elevates symmetrically, uvula midline 11th - shoulder shrug symmetric 12th - tongue protrusion midline  MOTOR:  Decrease bulk, full strength in the BUE, BLE  SENSORY:  normal and symmetric to light touch  COORDINATION:  finger-nose-finger, fine finger movements normal  GAIT/STATION:  In a wheelchair but can stand unassisted   DIAGNOSTIC DATA (LABS, IMAGING, TESTING) - I reviewed patient records, labs, notes, testing and imaging myself where available.  Lab Results  Component Value Date   WBC 8.8 06/06/2022   HGB 11.3 (L) 06/06/2022   HCT 36.1 06/06/2022   MCV 91.3 06/06/2022   PLT 395.0 06/06/2022      Component Value Date/Time   NA 137 06/06/2022 1441   NA 142 02/23/2018 1525   K 4.1 06/06/2022 1441   CL 103 06/06/2022 1441   CO2 24 06/06/2022 1441   GLUCOSE 102 (H) 06/06/2022 1441   BUN 39 (H) 06/06/2022 1441   BUN 17 02/23/2018 1525   CREATININE 0.85 06/06/2022 1441   CALCIUM 9.4 06/06/2022  1441   PROT 8.2 06/06/2022 1441  ALBUMIN 3.9 06/06/2022 1441   AST 14 06/06/2022 1441   ALT 11 06/06/2022 1441   ALKPHOS 98 06/06/2022 1441   BILITOT 0.3 06/06/2022 1441   GFRNONAA 58 (L) 03/23/2022 1800   GFRAA 70 02/23/2018 1525   Lab Results  Component Value Date   CHOL 168 04/04/2016   HDL 58.10 04/04/2016   LDLCALC 89 04/04/2016   TRIG 105.0 04/04/2016   CHOLHDL 3 04/04/2016   Lab Results  Component Value Date   HGBA1C 5.5 05/07/2013   Lab Results  Component Value Date   VITAMINB12 1,154 (H) 02/25/2022   Lab Results  Component Value Date   TSH 4.81 03/30/2021     ASSESSMENT AND PLAN  89 y.o. year old female with multiple medical conditions including atrial fibrillation, multiple UTIs, history of pelvic floor dysfunction requiring self-catheterization, poor oral Intake who is presenting for follow up for her poor oral intake, weight loss, sleep difficulty and memory problem and headaches.  In term of the poor oral intake  and weight loss, daughter has reported improvement since starting the Dronabinol. She eats small meals but frequent meal. She has gained weight.  Her headaches have improved on gabapentin, will continue Gabapentin 300 mg twice daily  For her memory deficit, patient likely has MCI vs. Mild Dementia, likely Alzheimer.  Daughter does not want to move forward with additional testing so we will continue her medications as is and I will see her in 1 year for follow up.     1. Tension-type headache, not intractable, unspecified chronicity pattern   2. Encounter for palliative care involving management of pain   3. Tremor        Patient Instructions  Continue with gabapentin 300 mg twice daily Continue your other medications Continue to follow with PCP Return in 1 year or sooner if worse  There are well-accepted and sensible ways to reduce risk for Alzheimers disease and other degenerative brain disorders .  Exercise Daily Walk A daily 20 minute  walk should be part of your routine. Disease related apathy can be a significant roadblock to exercise and the only way to overcome this is to make it a daily routine and perhaps have a reward at the end (something your loved one loves to eat or drink perhaps) or a personal trainer coming to the home can also be very useful. Most importantly, the patient is much more likely to exercise if the caregiver / spouse does it with him/her. In general a structured, repetitive schedule is best.  General Health: Any diseases which effect your body will effect your brain such as a pneumonia, urinary infection, blood clot, heart attack or stroke. Keep contact with your primary care doctor for regular follow ups.  Sleep. A good nights sleep is healthy for the brain. Seven hours is recommended. If you have insomnia or poor sleep habits we can give you some instructions. If you have sleep apnea wear your mask.  Diet: Eating a heart healthy diet is also a good idea; fish and poultry instead of red meat, nuts (mostly non-peanuts), vegetables, fruits, olive oil or canola oil (instead of butter), minimal salt (use other spices to flavor foods), whole grain rice, bread, cereal and pasta and wine in moderation.Research is now showing that the MIND diet, which is a combination of The Mediterranean diet and the DASH diet, is beneficial for cognitive processing and longevity. Information about this diet can be found in The MIND Diet, a book by Alonna Minium, MS,  RDN, and online at [employer].es  Finances, Power of [employer] and Advance Directives: You should consider putting legal safeguards in place with regard to financial and medical decision making. While the spouse always has power of attorney for medical and financial issues in the absence of any form, you should consider what you want in case the spouse / caregiver is no longer around or capable of making decisions.     No orders of the  defined types were placed in this encounter.   Meds ordered this encounter  Medications   gabapentin (NEURONTIN) 300 MG capsule    Sig: Take 1 capsule (300 mg total) by mouth 2 (two) times daily.    Dispense:  180 capsule    Refill:  3    Return in about 1 year (around 06/15/2023).   I have spent a total of 50 minutes dedicated to this patient today, preparing to see patient, performing a medically appropriate examination and evaluation, ordering tests and/or medications and procedures, and counseling and educating the patient/family/caregiver; independently interpreting result and communicating results to the family/patient/caregiver; and documenting clinical information in the electronic medical record.   Windell Norfolk, MD 06/15/2022, 4:25 PM  [facility] [address], [address] [place], [place] [address] [phone]

## 2022-06-15 NOTE — Patient Instructions (Signed)
Continue with gabapentin 300 mg twice daily Continue your other medications Continue to follow with PCP Return in 1 year or sooner if worse  There are well-accepted and sensible ways to reduce risk for Alzheimers disease and other degenerative brain disorders .  Exercise Daily Walk A daily 20 minute walk should be part of your routine. Disease related apathy can be a significant roadblock to exercise and the only way to overcome this is to make it a daily routine and perhaps have a reward at the end (something your loved one loves to eat or drink perhaps) or a personal trainer coming to the home can also be very useful. Most importantly, the patient is much more likely to exercise if the caregiver / spouse does it with him/her. In general a structured, repetitive schedule is best.  General Health: Any diseases which effect your body will effect your brain such as a pneumonia, urinary infection, blood clot, heart attack or stroke. Keep contact with your primary care doctor for regular follow ups.  Sleep. A good nights sleep is healthy for the brain. Seven hours is recommended. If you have insomnia or poor sleep habits we can give you some instructions. If you have sleep apnea wear your mask.  Diet: Eating a heart healthy diet is also a good idea; fish and poultry instead of red meat, nuts (mostly non-peanuts), vegetables, fruits, olive oil or canola oil (instead of butter), minimal salt (use other spices to flavor foods), whole grain rice, bread, cereal and pasta and wine in moderation.Research is now showing that the MIND diet, which is a combination of The Mediterranean diet and the DASH diet, is beneficial for cognitive processing and longevity. Information about this diet can be found in The MIND Diet, a book by Alonna Minium, MS, RDN, and online at [employer].es  Finances, Power of [address] and Advance Directives: You should consider putting legal safeguards in place  with regard to financial and medical decision making. While the spouse always has power of attorney for medical and financial issues in the absence of any form, you should consider what you want in case the spouse / caregiver is no longer around or capable of making decisions.

## 2022-07-19 ENCOUNTER — Other Ambulatory Visit: Payer: Self-pay | Admitting: Internal Medicine

## 2022-07-21 ENCOUNTER — Encounter: Payer: Self-pay | Admitting: Nurse Practitioner

## 2022-07-25 DIAGNOSIS — H353132 Nonexudative age-related macular degeneration, bilateral, intermediate dry stage: Secondary | ICD-10-CM | POA: Diagnosis not present

## 2022-07-25 DIAGNOSIS — H02052 Trichiasis without entropian right lower eyelid: Secondary | ICD-10-CM | POA: Diagnosis not present

## 2022-07-25 DIAGNOSIS — H401132 Primary open-angle glaucoma, bilateral, moderate stage: Secondary | ICD-10-CM | POA: Diagnosis not present

## 2022-07-25 DIAGNOSIS — M3501 Sicca syndrome with keratoconjunctivitis: Secondary | ICD-10-CM | POA: Diagnosis not present

## 2022-07-28 ENCOUNTER — Other Ambulatory Visit: Payer: Self-pay

## 2022-09-06 ENCOUNTER — Encounter: Payer: Self-pay | Admitting: Internal Medicine

## 2022-09-07 ENCOUNTER — Other Ambulatory Visit: Payer: Self-pay | Admitting: Internal Medicine

## 2022-09-07 DIAGNOSIS — R64 Cachexia: Secondary | ICD-10-CM

## 2022-09-07 DIAGNOSIS — J449 Chronic obstructive pulmonary disease, unspecified: Secondary | ICD-10-CM

## 2022-09-07 MED ORDER — DRONABINOL 5 MG PO CAPS: ORAL_CAPSULE | ORAL | 2 refills | Status: DC

## 2022-09-08 ENCOUNTER — Telehealth: Payer: Self-pay | Admitting: Pharmacy Technician

## 2022-09-08 ENCOUNTER — Other Ambulatory Visit: Payer: Self-pay | Admitting: Internal Medicine

## 2022-09-08 ENCOUNTER — Other Ambulatory Visit (HOSPITAL_COMMUNITY): Payer: Self-pay

## 2022-09-08 DIAGNOSIS — R64 Cachexia: Secondary | ICD-10-CM

## 2022-09-08 DIAGNOSIS — R110 Nausea: Secondary | ICD-10-CM

## 2022-09-08 DIAGNOSIS — J449 Chronic obstructive pulmonary disease, unspecified: Secondary | ICD-10-CM

## 2022-09-08 MED ORDER — DRONABINOL 2.5 MG PO CAPS: 2.5000 mg | ORAL_CAPSULE | Freq: Two times a day (BID) | ORAL | 1 refills | Status: DC

## 2022-09-08 NOTE — Telephone Encounter (Signed)
Pharmacy Patient Advocate Encounter   Received notification from CoverMyMeds that prior authorization for droNABinol 5MG  capsules is required/requested.   Insurance verification completed.   The patient is insured through  [employer]  .   Per test claim: PA required; PA submitted to [employer] via CoverMyMeds Key/confirmation #/[facility] Status is pending

## 2022-09-08 NOTE — Telephone Encounter (Signed)
Pharmacy Patient Advocate Encounter  Received notification from [employer] that Prior Authorization for dronabinol has been DENIED.  See denial reason below. No denial letter attached in CMM. Will attache denial letter to Media tab once received.   PA #/Case ID/Reference #: [id number]

## 2022-09-12 ENCOUNTER — Other Ambulatory Visit (HOSPITAL_COMMUNITY): Payer: Self-pay

## 2022-09-12 NOTE — Telephone Encounter (Signed)
Appeal has been approved Pharmacy Patient Advocate Encounter  Received notification from [employer]ET[employer] that Prior Authorization for Dronabinol has been APPROVED from 02/03/2022 to 09/09/2023. Ran test claim, Copay is $31.35. This test claim was processed through [facility]- copay amounts may vary at other pharmacies due to pharmacy/plan contracts, or as the patient moves through the different stages of their insurance plan.

## 2022-09-22 ENCOUNTER — Other Ambulatory Visit (HOSPITAL_COMMUNITY): Payer: Self-pay

## 2022-10-26 ENCOUNTER — Other Ambulatory Visit: Payer: Self-pay | Admitting: Internal Medicine

## 2022-10-26 DIAGNOSIS — R64 Cachexia: Secondary | ICD-10-CM

## 2022-10-26 DIAGNOSIS — J449 Chronic obstructive pulmonary disease, unspecified: Secondary | ICD-10-CM

## 2022-10-26 DIAGNOSIS — R110 Nausea: Secondary | ICD-10-CM

## 2022-10-26 MED ORDER — DRONABINOL 2.5 MG PO CAPS: 2.5000 mg | ORAL_CAPSULE | Freq: Two times a day (BID) | ORAL | 1 refills | Status: DC

## 2022-10-27 ENCOUNTER — Other Ambulatory Visit: Payer: Self-pay | Admitting: Internal Medicine

## 2022-10-27 DIAGNOSIS — J449 Chronic obstructive pulmonary disease, unspecified: Secondary | ICD-10-CM

## 2022-10-27 DIAGNOSIS — R64 Cachexia: Secondary | ICD-10-CM

## 2022-10-27 DIAGNOSIS — R110 Nausea: Secondary | ICD-10-CM

## 2022-10-27 MED ORDER — DRONABINOL 2.5 MG PO CAPS: 2.5000 mg | ORAL_CAPSULE | Freq: Two times a day (BID) | ORAL | 1 refills | Status: DC

## 2022-11-08 ENCOUNTER — Other Ambulatory Visit: Payer: Self-pay | Admitting: Internal Medicine

## 2022-11-08 DIAGNOSIS — I480 Paroxysmal atrial fibrillation: Secondary | ICD-10-CM

## 2022-11-08 DIAGNOSIS — D6869 Other thrombophilia: Secondary | ICD-10-CM

## 2022-11-11 ENCOUNTER — Other Ambulatory Visit: Payer: Self-pay | Admitting: Internal Medicine

## 2022-11-11 DIAGNOSIS — D6869 Other thrombophilia: Secondary | ICD-10-CM

## 2022-11-11 DIAGNOSIS — I480 Paroxysmal atrial fibrillation: Secondary | ICD-10-CM

## 2022-11-18 DIAGNOSIS — M3501 Sicca syndrome with keratoconjunctivitis: Secondary | ICD-10-CM | POA: Diagnosis not present

## 2022-11-18 DIAGNOSIS — H353132 Nonexudative age-related macular degeneration, bilateral, intermediate dry stage: Secondary | ICD-10-CM | POA: Diagnosis not present

## 2022-11-18 DIAGNOSIS — H353123 Nonexudative age-related macular degeneration, left eye, advanced atrophic without subfoveal involvement: Secondary | ICD-10-CM | POA: Diagnosis not present

## 2022-11-18 DIAGNOSIS — H02052 Trichiasis without entropian right lower eyelid: Secondary | ICD-10-CM | POA: Diagnosis not present

## 2022-11-18 DIAGNOSIS — H401132 Primary open-angle glaucoma, bilateral, moderate stage: Secondary | ICD-10-CM | POA: Diagnosis not present

## 2022-11-22 ENCOUNTER — Other Ambulatory Visit: Payer: Self-pay | Admitting: Internal Medicine

## 2022-11-22 DIAGNOSIS — D6869 Other thrombophilia: Secondary | ICD-10-CM

## 2022-11-22 DIAGNOSIS — I480 Paroxysmal atrial fibrillation: Secondary | ICD-10-CM

## 2022-11-23 ENCOUNTER — Encounter: Payer: Self-pay | Admitting: Internal Medicine

## 2022-12-25 DIAGNOSIS — Z23 Encounter for immunization: Secondary | ICD-10-CM | POA: Diagnosis not present

## 2022-12-26 ENCOUNTER — Ambulatory Visit: Payer: Medicare Other

## 2022-12-27 ENCOUNTER — Other Ambulatory Visit (HOSPITAL_COMMUNITY): Payer: Self-pay

## 2022-12-27 ENCOUNTER — Encounter: Payer: Self-pay | Admitting: Oncology

## 2022-12-27 MED ORDER — DRONABINOL 2.5 MG PO CAPS
2.5000 mg | ORAL_CAPSULE | Freq: Two times a day (BID) | ORAL | 1 refills | Status: DC
  Filled 2022-12-27: qty 14, 7d supply, fill #0
  Filled 2022-12-27: qty 32, 16d supply, fill #0
  Filled 2023-01-06: qty 60, 30d supply, fill #1

## 2023-01-03 ENCOUNTER — Other Ambulatory Visit: Payer: Self-pay | Admitting: Neurology

## 2023-01-06 ENCOUNTER — Other Ambulatory Visit (HOSPITAL_COMMUNITY): Payer: Self-pay

## 2023-01-06 ENCOUNTER — Other Ambulatory Visit: Payer: Self-pay | Admitting: Internal Medicine

## 2023-01-06 NOTE — Telephone Encounter (Signed)
 Copied from CRM [id number]. Topic: Clinical - Medication Refill >> Jan 06, 2023  4:34 PM Corin V wrote: Most Recent Primary Care Visit:  Provider: [facility]  Department: [facility]  Visit Type: LAB  Date: 05/27/2022  Medication: dronabinol  (MARINOL ) 2.5 MG capsule   Has the patient contacted their pharmacy? Yes (Agent: If no, request that the patient contact the pharmacy for the refill. If patient does not wish to contact the pharmacy document the reason why and proceed with request.) (Agent: If yes, when and what did the pharmacy advise?)  Is this the correct pharmacy for this prescription? Yes If no, delete pharmacy and type the correct one.  This is the patient's preferred pharmacy:   [facility]  [address] [place], [place] [address]  Has the prescription been filled recently? No  Is the patient out of the medication? Yes  Has the patient been seen for an appointment in the last year OR does the patient have an upcoming appointment? Yes  Can we respond through MyChart? No  Agent: Please be advised that Rx refills may take up to 3 business days. We ask that you follow-up with your pharmacy.

## 2023-01-07 ENCOUNTER — Other Ambulatory Visit (HOSPITAL_COMMUNITY): Payer: Self-pay

## 2023-01-09 ENCOUNTER — Other Ambulatory Visit (HOSPITAL_COMMUNITY): Payer: Self-pay

## 2023-01-09 ENCOUNTER — Other Ambulatory Visit: Payer: Self-pay | Admitting: Internal Medicine

## 2023-01-09 DIAGNOSIS — J449 Chronic obstructive pulmonary disease, unspecified: Secondary | ICD-10-CM

## 2023-01-09 DIAGNOSIS — R64 Cachexia: Secondary | ICD-10-CM

## 2023-01-09 MED ORDER — DRONABINOL 2.5 MG PO CAPS: 2.5000 mg | ORAL_CAPSULE | Freq: Two times a day (BID) | ORAL | 0 refills | Status: DC

## 2023-01-09 MED ORDER — DRONABINOL 2.5 MG PO CAPS
2.5000 mg | ORAL_CAPSULE | Freq: Two times a day (BID) | ORAL | 0 refills | Status: DC
  Filled 2023-01-09: qty 60, 30d supply, fill #0

## 2023-01-09 NOTE — Telephone Encounter (Signed)
 I am unable to change rx location, pt daughter needs rx [facility] [address]

## 2023-01-09 NOTE — Addendum Note (Signed)
 Addended by: Marinus Maw on: 01/09/2023 11:55 AM   Modules accepted: Orders

## 2023-01-09 NOTE — Addendum Note (Signed)
 Addended by: Marinus Maw on: 01/09/2023 11:53 AM   Modules accepted: Orders

## 2023-01-09 NOTE — Telephone Encounter (Signed)
 Copied from CRM [id number]. Topic: Clinical - Prescription Issue >> Jan 09, 2023 11:40 AM Burnard DEL wrote: Reason for CRM: patients daughter Bruna Pizza called in stating that medication medronabinol (MARINOL ) 2.5 MG capsule was suppose [id number] [place],  - [address]  Phone: [phone] Fax: [phone]

## 2023-01-18 ENCOUNTER — Ambulatory Visit (INDEPENDENT_AMBULATORY_CARE_PROVIDER_SITE_OTHER)
Admission: RE | Admit: 2023-01-18 | Discharge: 2023-01-18 | Disposition: A | Discharge status: Home / Self Care | Payer: Medicare Other | Source: Ambulatory Visit | Attending: Nurse Practitioner | Admitting: Nurse Practitioner

## 2023-01-18 ENCOUNTER — Ambulatory Visit (INDEPENDENT_AMBULATORY_CARE_PROVIDER_SITE_OTHER): Payer: Medicare Other | Admitting: Nurse Practitioner

## 2023-01-18 ENCOUNTER — Encounter: Payer: Self-pay | Admitting: Nurse Practitioner

## 2023-01-18 VITALS — BP 130/70 | HR 84 | Ht 64.0 in | Wt 127.4 lb

## 2023-01-18 DIAGNOSIS — R918 Other nonspecific abnormal finding of lung field: Secondary | ICD-10-CM | POA: Diagnosis not present

## 2023-01-18 DIAGNOSIS — R079 Chest pain, unspecified: Secondary | ICD-10-CM | POA: Diagnosis not present

## 2023-01-18 DIAGNOSIS — J189 Pneumonia, unspecified organism: Secondary | ICD-10-CM | POA: Diagnosis not present

## 2023-01-18 DIAGNOSIS — R0781 Pleurodynia: Secondary | ICD-10-CM | POA: Diagnosis not present

## 2023-01-18 DIAGNOSIS — R0789 Other chest pain: Secondary | ICD-10-CM | POA: Diagnosis not present

## 2023-01-18 DIAGNOSIS — J479 Bronchiectasis, uncomplicated: Secondary | ICD-10-CM

## 2023-01-18 MED ORDER — AMOXICILLIN-POT CLAVULANATE 400-57 MG/5ML PO SUSR: 875.0000 mg | Freq: Two times a day (BID) | ORAL | 0 refills | Status: AC

## 2023-01-18 MED ORDER — AMOXICILLIN-POT CLAVULANATE 875-125 MG PO TABS: 1.0000 | ORAL_TABLET | Freq: Two times a day (BID) | ORAL | 0 refills | Status: DC

## 2023-01-18 MED ORDER — LIDOCAINE 5 % EX PTCH: 1.0000 | MEDICATED_PATCH | CUTANEOUS | 0 refills | Status: DC

## 2023-01-18 NOTE — Patient Instructions (Addendum)
 Continue liquid mucinex  three times daily   Continue Hypertonic Neb to twice daily until symptoms improve  Continue Xopenex  Neb every 6 hours as needed for shortness of breath, cough, wheezing Continue Flutter valve to 2-3 times a day; use after your nebulizer High protein/calorie diet    We may consider some prednisone  depending on your x ray results  Lidocaine  patches to affected area - apply one every 24 hours and remove after 12 hours until symptoms improve  Acetaminophen  650 mg every 6-8 hours over the counter as needed for pain     Chest and rib x ray today.    Follow up in 3-4 weeks with Julie Silva or Julie Silva. If symptoms do not improve or worsen, please contact office for sooner follow up or seek emergency care.

## 2023-01-18 NOTE — Progress Notes (Signed)
01/18/2023 Addendum: CXR with increased density in RLL when compared to previous 03/2022 and possible effusion. Waiting final read from radiology; however given pt's symptoms of pleurisy and findings on imaging, will treat for CAP with empiric augmentin bid x 7 days. Advised Pt's daughter [name], [place], of imaging results and plan. Verbalized understanding. Close follow up and strict return precautions.

## 2023-01-18 NOTE — Progress Notes (Signed)
@Patient  ID: Julie Silva, female    DOB: 04/02/1927, 89 y.o.   MRN: [id number]  Chief Complaint  Patient presents with   Acute Visit    Pt c/o SOB    Referring provider: Etta Grandchild, MD  HPI: 89 year old female, never smoker followed for bronchiectasis, abnormal CT suspicious for MAI, and chronic respiratory failure on supplemental oxygen. She is a patient of Dr. Shirlee More and last seen on 02/11/2022. Past medical history significant for a fib on amiodarone and eliquis, second degree AV block s/p pacemaker, GERD, HLD, diastolic CHF, IDA.   TEST/EVENTS:  06/12/2021 HRCT chest: very extensive, now dense consolidation and clustered centrilobular nodularity throughout the right lower lobe. Cavitary lesion in RLL 2.9x2.1 cm; cavitary lesion in LLL 1.5x1.1 cm. Unchanged dense consolidation and fibrosis in RML. Trace right pleural effusion.  03/24/2022 CT chest: atherosclerosis/CAD. Clustered nodularity and consolidation in RLL, stable since prior study. Clustered nodular densities in LLL, stable. Biapical scarring. Btx in RML, stable.   07/26/2021: Virtual visit with Parrett,NP.  Seen approximately 1 month ago with increased symptoms with cough and congestion.  Treated with Augmentin for 10 days.  Feels like she keeps going downhill.  Cough and congestion have gotten somewhat better but she continues to feel very weak with low energy.  Very low appetite and has weight loss.  Has been started on Marinol by primary care but not seen any uptake in her appetite.  Trying to get at least 2 boosts a day in.  Very unhappy with her current eating situation as she does not want to eat every hour and she just does not have any appetite.  She is awaiting vest therapy.  Palliative care referral has been made but they have not yet met with palliative care.  Long discussion regarding goals of care and palliative care.  Upcoming CT chest in September.  Advised to use liquid Mucinex twice daily, hypertonic saline nebs,  Xopenex nebs and flutter valve for mucociliary clearance.  Begin vest therapy once obtained.  10/15/2021: OV with Chiffon Kittleson NP for acute visit.  Over the past 2 to 3 days, she has been coughing more.  Her sputum is overall unchanged from her baseline.  White to gray.  Does not feel like she had any increased production of sputum; just feels like she has to cough more often.  She does occasionally feel like mucus gets stuck in her chest and she has trouble getting it up.  Breathing is stable.  Her palliative care nurse practitioner noted that she had more crackles in her right lung.  Her daughter has noticed that her breathing has been a little more noisy, especially at night.  They have not noticed any significant wheezing.  She denies any fevers, chills, night sweats, hemoptysis.  She she is using her hypertonic saline neb in the morning and Xopenex neb in the evening.  She increase Mucinex to 3 times a day per the advice of the palliative NP.  She is not using her flutter valve on a consistent basis.  She did receive the vest therapy that was previously ordered but she was unable to tolerate this, even on the lowest setting.  Gave her a bad headache and felt like she had a lot of pressure in her head with it.  11/08/2021: OV with Parrett,NP. F/u for bronchiectatic flare. Feeling better. Getting stronger over last several weeks. Has a daily cough that is quite congested. Unable to tolerate vest. Using flutter 2-3 times  a day. Continue aggressive mucociliary clearance regimen. Decrease hypertonic nebs down to 1-2 times a day. Flu shot. CT chest as planned. Weight improving  02/11/2022: OV with Dr. Isaiah Serge. Recurrent UTIs and chronic abx use with cefdinir. Increased cough and congestion for the last few weeks. Check sputum cultures and AFB. Prescribe augmentin and prednisone.   01/19/2023: Today - acute Discussed the use of AI scribe software for clinical note transcription with the patient, who gave verbal consent to  proceed.  History of Present Illness   The patient, accompanied by her daughter, presented with a new onset of discomfort to her right side that began the previous day. Yesterday the pain felt like it radiated to her arm but this has since improved. she does feel it's a little bit better today. The discomfort was characterized as a sharp pain, particularly noticeable during attempts to take deep breaths, which were cut short due to the pain, and certain movements. It is sore when she presses on that side. The pain was localized to the right side and was still present at the time of the consultation, albeit less severe than initially.  The patient denied any increase in coughing or changes in sputum production. She reported using her inhaler or nebulizer 1-2 times a day, and has continued this regimen. No fevers, chills, hemoptysis, calf pain/swelling, palpitations, lightheadedness/dizziness. No low O2 levels.   She denied any recent increase in physical activity or any recent injuries/falls that could have contributed to the current discomfort.       Allergies  Allergen Reactions   Codeine Other (See Comments)    Passed out   Colchicine Diarrhea    Immunization History  Administered Date(s) Administered   Fluad Quad(high Dose 65+) 12/11/2018, 12/25/2019, 10/02/2020, 11/08/2021   Influenza, High Dose Seasonal PF 10/03/2013, 09/25/2014, 10/06/2015, 10/11/2016, 10/26/2017, 02/02/2021   Influenza-Unspecified 10/03/2012, 11/04/2019, 02/02/2021   PFIZER(Purple Top)SARS-COV-2 Vaccination 01/29/2019, 02/12/2019, 11/25/2019   Pneumococcal Conjugate-13 05/07/2013   Pneumococcal Polysaccharide-23 06/09/2010   Td 04/03/2000   Tdap 05/07/2013   Unspecified SARS-COV-2 Vaccination 01/29/2019, 02/19/2019   Zoster, Live 06/08/2011    Past Medical History:  Diagnosis Date   Anemia    Aortic atherosclerosis (HCC)    Arthritis    Osteoporosis, neck stiffness   Atrial fibrillation (HCC)    Bleeds  easily [facility])    "father was a hemophiliac" - pt not being bothered in recent years.   CAD (coronary artery disease)    Calcium deposit in bursa of knee 2017   Cancer (HCC)    skin cancer "basal cell".   Chicken pox    Cyst, Baker's knee    left knee- tx. Cortisone injection 05-05-15 in office- "improved pain relief and swelling left ankle and knee"   Diverticulitis    Diverticulosis    Dysrhythmia    Dr. McAlhany-cardiology   GERD (gastroesophageal reflux disease)    Glaucoma of both eyes    Gout    Hearing loss of both ears    Bilateral ears- hearing aids used- right ear hearing betther than left.   Hiatal hernia    History of blood transfusion 1957   "lots; most related to my periods"-last 1968 -s/p Hysterectomy   Migraines    "years ago" (02/04/2013)   Motion sickness    back seat - cars, sudden movements   PAF (paroxysmal atrial fibrillation) (HCC)    Pancreatic cyst    Pelvic floor dysfunction    PONV (postoperative nausea and vomiting)  Risk for falls    "wobbley" per daughter   Self-catheterizes urinary bladder    "daily- retained urine and chronic UTI"   SVT (supraventricular tachycardia) (HCC)    Dr. Reece Agar. Taylor-follows "loop recorder left chest"   Swallowing difficulty    freq occ. mostley liquids   Urine incontinence    UTI (urinary tract infection)    Chronic Urinary tract infections- tx Macrobid daily.   Vertigo    Wears hearing aid in both ears     Tobacco History: Social History   Tobacco Use  Smoking Status Never   Passive exposure: Past  Smokeless Tobacco Never   Counseling given: Not Answered   Outpatient Medications Prior to Visit  Medication Sig Dispense Refill   acetaminophen (TYLENOL) 500 MG tablet Take 500 mg by mouth 2 (two) times daily.     apixaban (ELIQUIS) 2.5 MG TABS tablet Take 1 tablet (2.5 mg total) by mouth 2 (two) times daily. 180 tablet 1   bimatoprost (LUMIGAN) 0.01 % SOLN Place 1 drop into both eyes at bedtime.     Catheters  MISC Replace catheter twice daily. 180 each 1   cefdinir (OMNICEF) 250 MG/5ML suspension Take 250 mg by mouth daily.     dorzolamide-timolol (COSOPT) 22.3-6.8 MG/ML ophthalmic solution Place 1 drop into both eyes 2 (two) times daily.     dronabinol (MARINOL) 2.5 MG capsule Take 1 capsule (2.5 mg total) by mouth 2 (two) times daily. 60 capsule 0   estradiol (ESTRACE) 0.1 MG/GM vaginal cream Place 1 Applicatorful vaginally every other day.     gabapentin (NEURONTIN) 300 MG capsule Take 1 capsule (300 mg total) by mouth 2 (two) times daily. 180 capsule 3   guaifenesin (ROBITUSSIN) 100 MG/5ML syrup Take 200 mg by mouth 3 (three) times daily.     Multiple Vitamins-Minerals (PRESERVISION AREDS 2) CAPS Take 1 tablet by mouth 2 (two) times daily.     omeprazole (PRILOSEC) 20 MG capsule TAKE 1 CAPSULE BY MOUTH EVERY DAY (Patient taking differently: Take 20 mg by mouth daily. TAKE 1 CAPSULE BY MOUTH EVERY DAY) 90 capsule 1   ondansetron (ZOFRAN-ODT) 4 MG disintegrating tablet Take 1 tablet (4 mg total) by mouth every 8 (eight) hours as needed for nausea or vomiting. 30 tablet 0   prochlorperazine (COMPAZINE) 10 MG tablet Take 2.5 mg by mouth every 4 (four) hours as needed for nausea or vomiting.     sodium chloride HYPERTONIC 3 % nebulizer solution INHALE 1 VIAL VIA NEBULIZER ONCE A DAY 750 mL 3   Wheat Dextrin (BENEFIBER PO) Take 5 mLs by mouth 2 (two) times daily.     Melatonin 1 MG CHEW Chew 1 tablet by mouth as needed (sleep).     Peppermint Oil (IBGARD PO) Take 2 tablets by mouth 2 (two) times daily.     No facility-administered medications prior to visit.     Review of Systems:   Constitutional: No weight loss or gain, night sweats, fevers, chills. +fatigue, lassitude (baseline) HEENT: No headaches, difficulty swallowing, tooth/dental problems, or sore throat. No sneezing, itching, ear ache, nasal congestion, or post nasal drip CV:  No chest pain, orthopnea, PND, swelling in lower extremities,  palpitations Resp: +shortness of breath with exertion (baseline); chronic cough. No excess mucus or change in color of mucus. No hemoptysis. No wheezing.  No chest wall deformity GI:  No heartburn, indigestion  MSK:  No joint pain or swelling. +right chest wall pain Neuro: No dizziness or lightheadedness.  Psych:  No depression or anxiety. Mood stable.     Physical Exam:  BP 130/70 (BP Location: Right Arm, Patient Position: Sitting)   Pulse 84   Ht 5\' 4"  (1.626 m)   Wt 127 lb 6.4 oz (57.8 kg)   SpO2 99%   BMI 21.87 kg/m   GEN: Pleasant, interactive; elderly, frail; in no acute distress. HEENT:  Normocephalic and atraumatic. PERRLA. Sclera white. Nasal turbinates pink, moist and patent bilaterally. No rhinorrhea present. Oropharynx pink and moist, without exudate or edema. No lesions, ulcerations, or postnasal drip.  NECK:  Supple w/ fair ROM. No JVD present. No lymphadenopathy.   CV: Irregular rhythm, rate controlled, no m/r/g, no peripheral edema. Pulses intact, +2 bilaterally. No cyanosis, pallor or clubbing. PULMONARY:  Unlabored, regular breathing. Diminished airflow bilaterally without wheezes/rales rhonchi. No accessory muscle use.  GI: BS present and normoactive. Soft, non-tender to palpation. No organomegaly or masses detected.  MSK: No erythema, warmth. Tenderness to right lateral chest wall upon palpation. No crepitus or bruising. No deformities or joint swelling noted. Muscle wasting Neuro: A/Ox3. No focal deficits noted.   Skin: Warm, no lesions or rashe Psych: Normal affect and behavior. Judgement and thought content appropriate.     Lab Results:  CBC    Component Value Date/Time   WBC 8.8 06/06/2022 1441   RBC 3.95 06/06/2022 1441   HGB 11.3 (L) 06/06/2022 1441   HGB 12.4 02/23/2018 1525   HCT 36.1 06/06/2022 1441   HCT 36.2 02/23/2018 1525   PLT 395.0 06/06/2022 1441   PLT 239 02/23/2018 1525   MCV 91.3 06/06/2022 1441   MCV 93 02/23/2018 1525   MCV 93  07/03/2012 1533   MCH 29.1 03/23/2022 1800   MCHC 31.3 06/06/2022 1441   RDW 14.7 06/06/2022 1441   RDW 12.4 02/23/2018 1525   RDW 13.1 07/03/2012 1533   LYMPHSABS 1.5 06/06/2022 1441   LYMPHSABS 1.7 02/23/2018 1525   LYMPHSABS 1.2 07/03/2012 1533   MONOABS 0.8 06/06/2022 1441   MONOABS 0.6 07/03/2012 1533   EOSABS 0.2 06/06/2022 1441   EOSABS 0.1 02/23/2018 1525   EOSABS 0.1 07/03/2012 1533   BASOSABS 0.0 06/06/2022 1441   BASOSABS 0.0 02/23/2018 1525   BASOSABS 0.1 07/03/2012 1533    BMET    Component Value Date/Time   NA 137 06/06/2022 1441   NA 142 02/23/2018 1525   K 4.1 06/06/2022 1441   CL 103 06/06/2022 1441   CO2 24 06/06/2022 1441   GLUCOSE 102 (H) 06/06/2022 1441   BUN 39 (H) 06/06/2022 1441   BUN 17 02/23/2018 1525   CREATININE 0.85 06/06/2022 1441   CALCIUM 9.4 06/06/2022 1441   GFRNONAA 58 (L) 03/23/2022 1800   GFRAA 70 02/23/2018 1525    BNP No results found for: "BNP"   Imaging:  DG Chest 2 View Result Date: 01/18/2023 CLINICAL DATA:  Right side pain EXAM: CHEST - 2 VIEW COMPARISON:  Rib series today.  Chest CT 03/24/2022 FINDINGS: Heart and mediastinal contours within normal limits. Airspace opacity in the right lower lobe concerning for pneumonia. Left lung clear. No visible effusions or pneumothorax. No acute bony abnormality. IMPRESSION: Airspace opacity in the right lower lobe concerning for pneumonia. Electronically Signed   By: Charlett Nose M.D.   On: 01/18/2023 17:55   DG Ribs Unilateral Right Result Date: 01/18/2023 CLINICAL DATA:  Right rib pain.  No known injury. EXAM: RIGHT RIBS - 2 VIEW COMPARISON:  Chest x-ray today.  Chest CT 03/24/2022. FINDINGS: No  visible displaced rib fracture. No pneumothorax. Patchy airspace disease in the right lower lobe could reflect atelectasis or pneumonia. Heart is normal size. IMPRESSION: No visible displaced rib fracture. Right lower lobe airspace opacity could reflect atelectasis or pneumonia. Electronically  Signed   By: Charlett Nose M.D.   On: 01/18/2023 17:55    Administration History     None           No data to display          No results found for: "NITRICOXIDE"      Assessment & Plan:     Right chest wall pain  Acute onset of sharp, right-sided chest pain radiating to the back and arm, exacerbated by deep breaths and certain movements. Tender to palpation. Differential diagnosis includes musculoskeletal pain, rib fracture, pleurisy, lung infection. Unlikely ACS given location and reproducible pain upon palpation. She is on chronic AC so low likelihood of PE and no other evidence of blood clot. The absence of systemic symptoms and reproducibility of pain with palpation suggest a musculoskeletal etiology; however, given age, frailty, and chronic lung disease, concern for infectious process remains. Will obtain CXR as well as rib x ray. Discussed potential muscle strain and treatment options including analgesic medications and lidocaine patches. Prednisone may be considered if no improvement or pending results.  - Order chest x-ray and rib x-ray - Recommend lidocaine patches  - Consider short course of prednisone if no improvement - Advise rest and use of OTC acetaminophen; avoid NSAIDs with chronic AC   Bronchiectasis No increased infectious symptoms. Cough appears to be at baseline. Continue current regimen. See above plan.   Fall Risk Uses a walker at home. No recent falls  - Continue use of walker - Use caution when mobilizing  - Remove fall hazards at home   Follow-up - Order stat x-ray - Call patient with results and further instructions.        I spent 35 minutes of dedicated to the care of this patient on the date of this encounter to include pre-visit review of records, face-to-face time with the patient discussing conditions above, post visit ordering of testing, clinical documentation with the electronic health record, making appropriate referrals as  documented, and communicating necessary findings to members of the patients care team.  Noemi Chapel, NP 01/19/2023  Pt aware and understands NP's role.

## 2023-01-19 ENCOUNTER — Other Ambulatory Visit (HOSPITAL_COMMUNITY): Payer: Self-pay

## 2023-01-19 ENCOUNTER — Encounter: Payer: Self-pay | Admitting: Nurse Practitioner

## 2023-01-19 ENCOUNTER — Telehealth: Payer: Self-pay

## 2023-01-19 NOTE — Telephone Encounter (Signed)
*  Pulm  Pharmacy Patient Advocate Encounter   Received notification from CoverMyMeds that prior authorization for Lidocaine 5% patches  is required/requested.  CMM Key: [employer] verification completed.   The patient is insured through [facility] .   This medication is typically not covered unless being prescribed for cancer related pain/pain due to shingles/fractures and other similar diagnosis. It seems that this is being recommended for chest pain due to pneumonia/coughing.   Patient may purchase 4% Lidocaine patches OTC

## 2023-01-30 ENCOUNTER — Encounter: Payer: Self-pay | Admitting: *Deleted

## 2023-01-30 NOTE — Telephone Encounter (Signed)
Sent this pt mychart msg letting her know can try OTC lidocaine patch

## 2023-02-10 IMAGING — CT CT CHEST W/O CM
1 series · 14 of 34 positions shown, 18 images · non-contrast
Comparison: 12/11/2018 chest radiograph.  07/31/2015 chest CT.

CLINICAL DATA: Abnormal outside chest radiograph with lung
opacities.

EXAM:
CT CHEST WITHOUT CONTRAST
TECHNIQUE: Multidetector CT imaging of the chest was performed following the
standard protocol without IV contrast.

[Series 2: chest w/(date) · axial · 0.58mm/px · z∈[-251,+15]mm · 14 of 157 slices shown, 18 images]
[im 12/157  mediastinal]
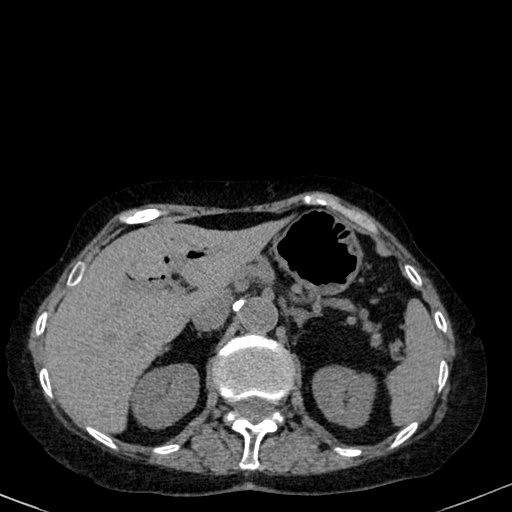
[im 12/157  lung]
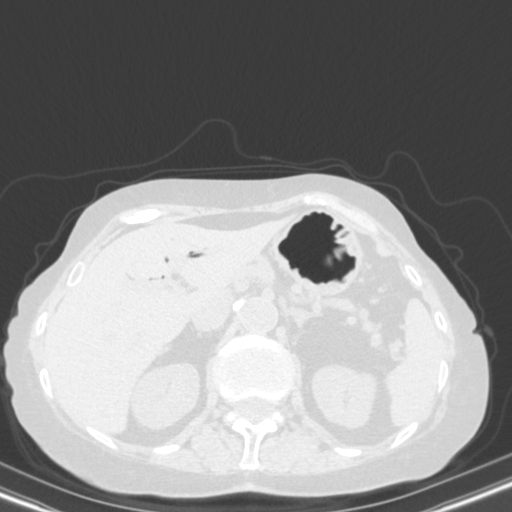
[im 24/157  lung]
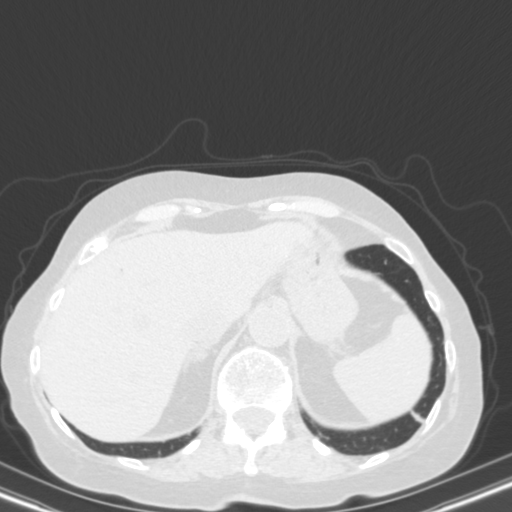
[im 32/157  lung]
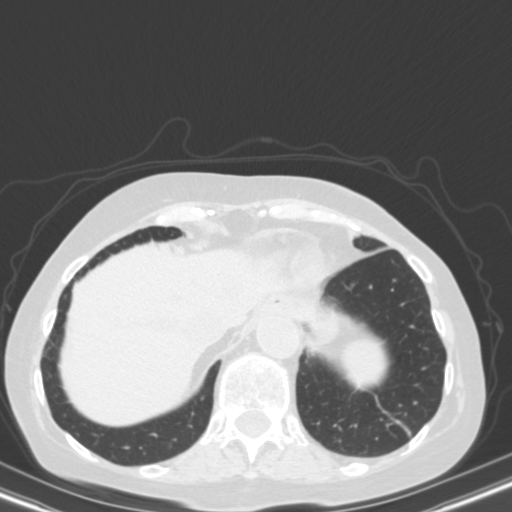
[im 47/157  lung]
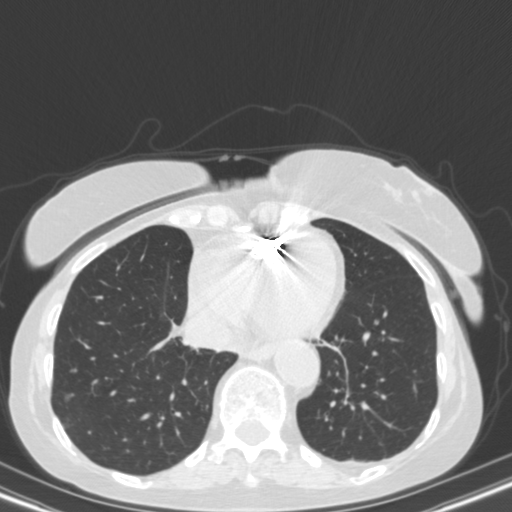
[im 58/157  mediastinal]
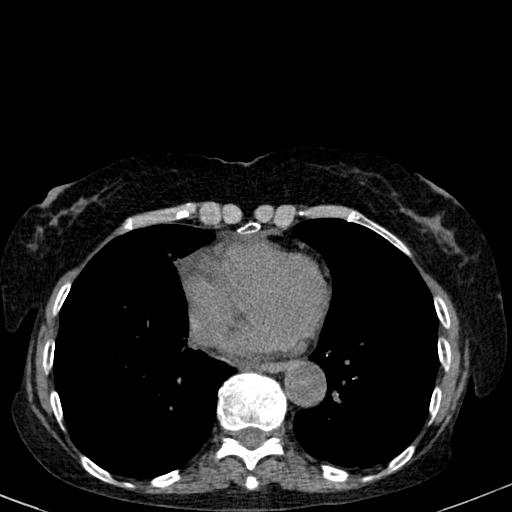
[im 58/157  lung]
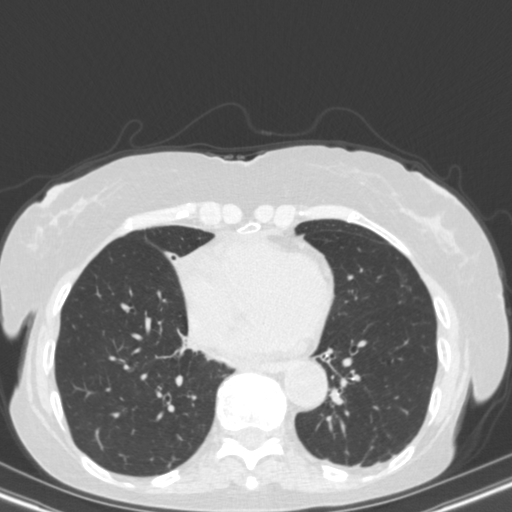
[im 64/157  lung]
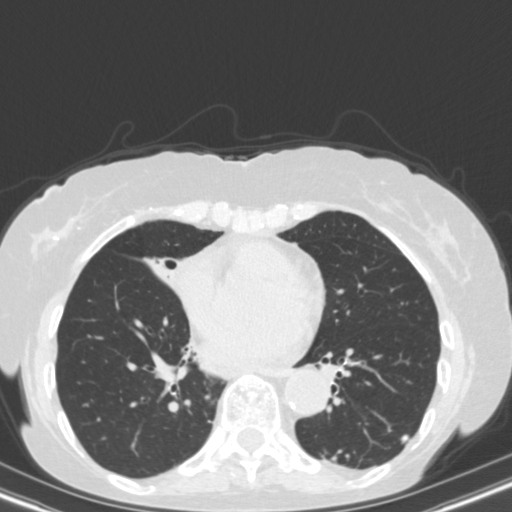
[im 75/157  lung]
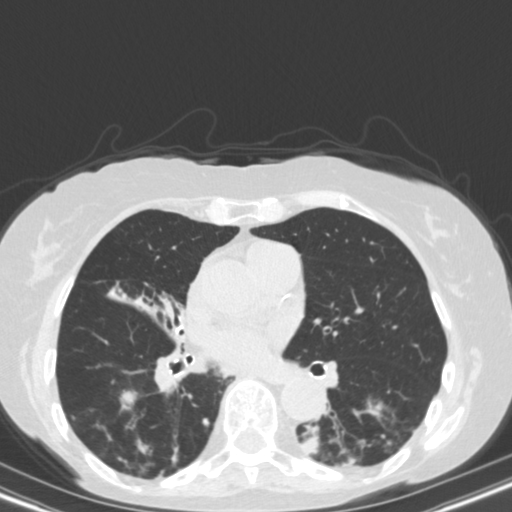
[im 84/157  lung]
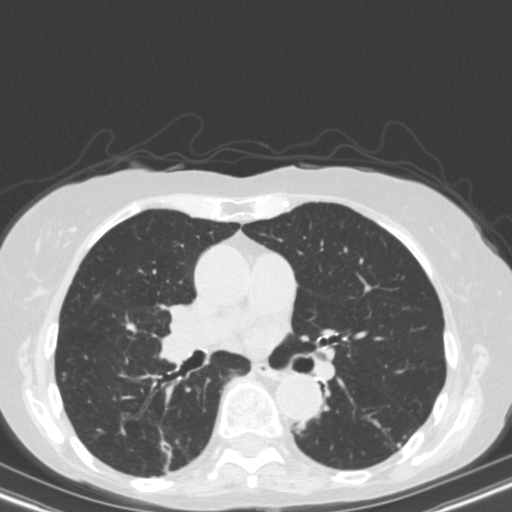
[im 93/157  mediastinal]
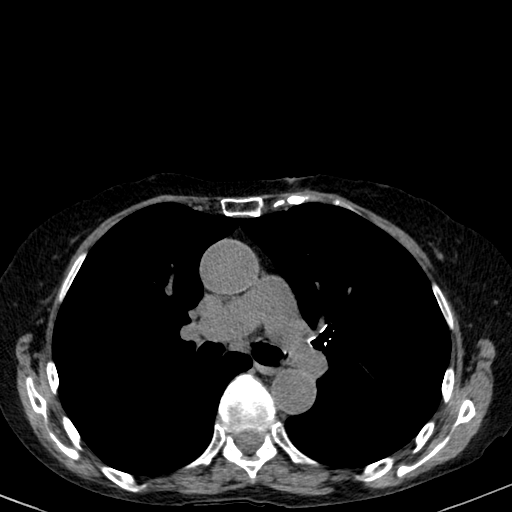
[im 93/157  lung]
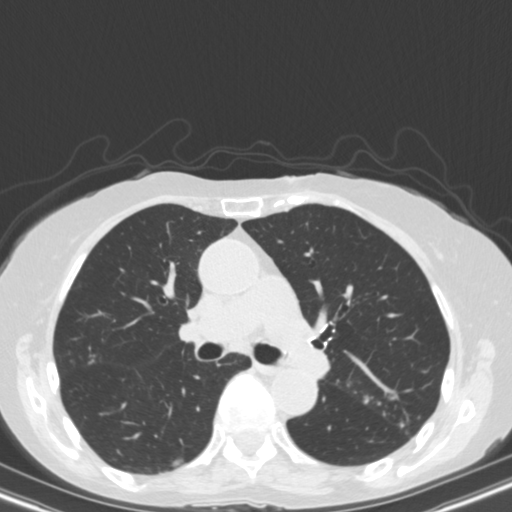
[im 99/157  lung]
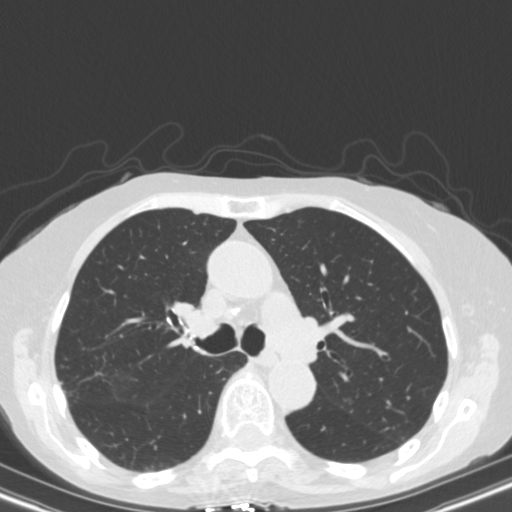
[im 116/157  lung]
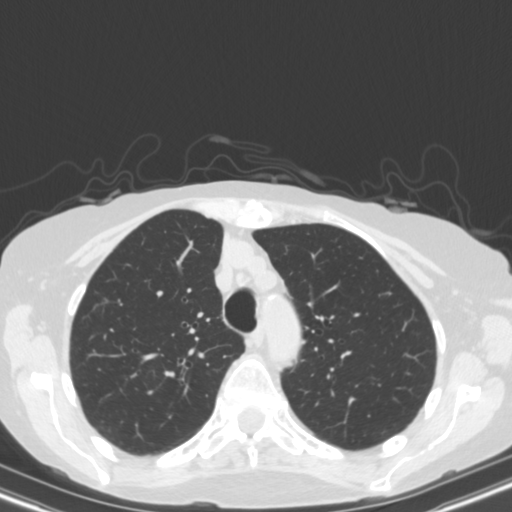
[im 125/157  lung]
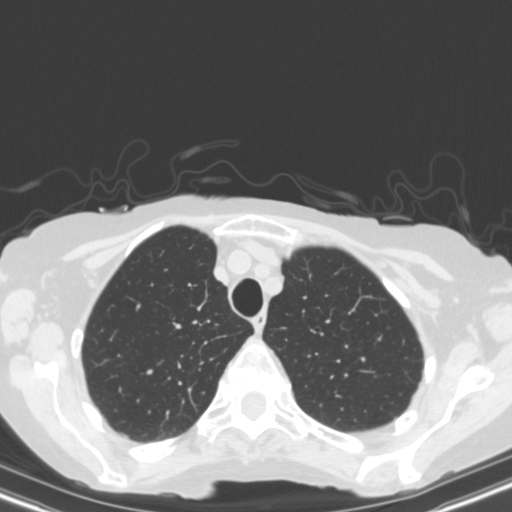
[im 133/157  mediastinal]
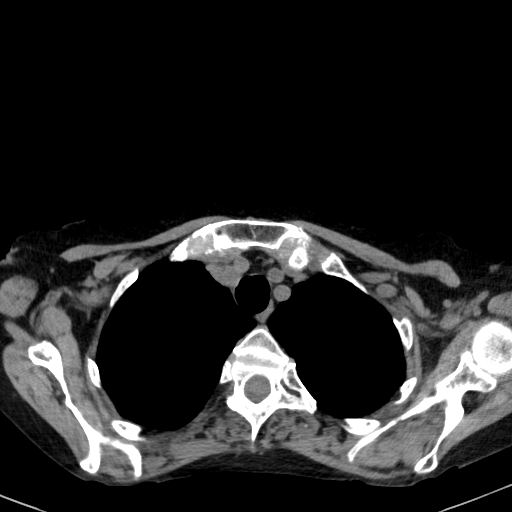
[im 133/157  lung]
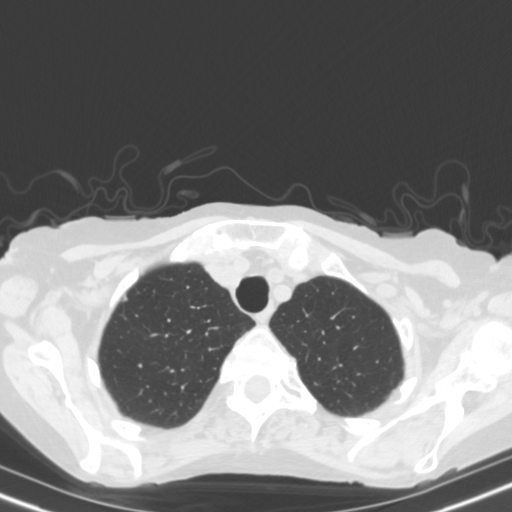
[im 145/157  lung]
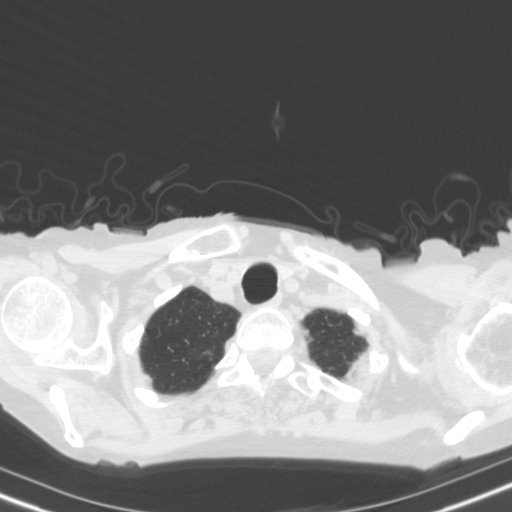

[14 of 34 positions shown; findings below may reference images not displayed]

FINDINGS: Cardiovascular: Normal heart size. No significant pericardial
effusion/thickening. Left anterior descending coronary
atherosclerosis. Lead less pacemaker in the right ventricle.
Atherosclerotic nonaneurysmal thoracic aorta. Normal caliber
pulmonary arteries.

Mediastinum/Nodes: No discrete thyroid nodules. Unremarkable
esophagus. No axillary adenopathy. Coarsely calcified nonenlarged
right paratracheal nodes are unchanged and compatible with prior
granulomatous disease. No pathologically enlarged mediastinal or
discrete hilar nodes on these noncontrast images.

Lungs/Pleura: No pneumothorax. No pleural effusion. Widespread
moderate cylindrical and varicoid bronchiectasis in both lungs, most
prominent in the right middle lobe, not substantially changed.
Associated moderate patchy tree-in-bud opacities and indistinct
peribronchovascular nodular foci of consolidation at the areas of
bronchiectasis, overall mildly improved since 07/31/2015 chest CT,
although with some waxing and waning change. For example a new
cavitary 2.1 cm right lower lobe peribronchovascular nodule (series
5/image 80). A right upper lobe 1.3 cm peribronchovascular nodular
focus of consolidation (series 5/image 56) is new, although the more
peripheral patchy tree-in-bud opacities have improved. Near complete
bandlike scarring in the right middle lobe is mildly worsened.

Upper abdomen: Cholecystectomy. Pneumobilia is unchanged and
compatible with presumed history of sphincterotomy. Granulomatous
splenic calcifications are unchanged.

Musculoskeletal: No aggressive appearing focal osseous lesions. Mild
thoracic spondylosis.
IMPRESSION: 1. Spectrum of findings most compatible with chronic atypical
mycobacterial infection (CEEJAY) with widespread moderate
bronchiectasis, patchy tree-in-bud opacities and indistinct nodular
peribronchovascular foci of consolidation. Findings are overall
improved since 07/31/2015 chest CT, although with some waxing and
waning change, including a new 2.1 cm cavitary right lower lobe
peribronchovascular nodule, favored to be due to CEEJAY. Follow-up
high-resolution chest CT suggested in 3-6 months.
2. One vessel coronary atherosclerosis.
3. Aortic Atherosclerosis ([id number].S).

## 2023-03-08 ENCOUNTER — Other Ambulatory Visit (HOSPITAL_COMMUNITY): Payer: Self-pay

## 2023-03-31 ENCOUNTER — Other Ambulatory Visit (HOSPITAL_COMMUNITY): Payer: Self-pay

## 2023-04-05 ENCOUNTER — Other Ambulatory Visit: Payer: Self-pay | Admitting: Internal Medicine

## 2023-04-05 DIAGNOSIS — R64 Cachexia: Secondary | ICD-10-CM

## 2023-04-05 DIAGNOSIS — J449 Chronic obstructive pulmonary disease, unspecified: Secondary | ICD-10-CM

## 2023-04-19 ENCOUNTER — Other Ambulatory Visit: Payer: Self-pay | Admitting: Internal Medicine

## 2023-04-19 NOTE — Telephone Encounter (Signed)
 Copied from CRM [id number]. Topic: Clinical - Medication Refill >> Apr 19, 2023  3:47 PM Rosamond Comes wrote: Patient daughter Deatra Face calling requesting medication refill  Most Recent Primary Care Visit:  Provider: [facility]  Department: [facility]  Visit Type: LAB  Date: 05/27/2022  Medication: cefdinir (OMNICEF) 250 MG/5ML suspension  Has the patient contacted their pharmacy? Yes (Agent: If no, request that the patient contact the pharmacy for the refill. If patient does not wish to contact the pharmacy document the reason why and proceed with request.) (Agent: If yes, when and what did the pharmacy advise?)  Is this the correct pharmacy for this prescription? Yes If no, delete pharmacy and type the correct one.  This is the patient's preferred pharmacy:   [phone], [place] - [address] [address] Phone: [phone] Fax: [phone]   Has the prescription been filled recently? Yes  Is the patient out of the medication? Yes  Has the patient been seen for an appointment in the last year OR does the patient have an upcoming appointment? Yes  Can we respond through MyChart? No  Agent: Please be advised that Rx refills may take up to 3 business days. We ask that you follow-up with your pharmacy.   Patient daughter Deatra Face [phone] ok to leave a detailed message.

## 2023-04-21 ENCOUNTER — Encounter: Payer: Self-pay | Admitting: Internal Medicine

## 2023-04-21 ENCOUNTER — Other Ambulatory Visit: Payer: Self-pay | Admitting: Internal Medicine

## 2023-04-21 NOTE — Telephone Encounter (Signed)
 Copied from CRM [id number]. Topic: Clinical - Medication Refill >> Apr 21, 2023 11:55 AM [name] C wrote: Most Recent Primary Care Visit:  Provider: [facility]  Department: [facility]  Visit Type: LAB  Date: 05/27/2022  Medication: cefdinir  (OMNICEF ) 250 MG/5ML suspension  Has the patient contacted their pharmacy? Yes (Agent: If no, request that the patient contact the pharmacy for the refill. If patient does not wish to contact the pharmacy document the reason why and proceed with request.) (Agent: If yes, when and what did the pharmacy advise?)  Is this the correct pharmacy for this prescription? Yes If no, delete pharmacy and type the correct one.  This is the patient's preferred pharmacy:   [phone], [place] - [address] [place] [address] [address] Phone: [phone] Fax: [phone]    Has the prescription been filled recently? No  Is the patient out of the medication? Yes  Has the patient been seen for an appointment in the last year OR does the patient have an upcoming appointment? Yes  Can we respond through MyChart? Yes  Agent: Please be advised that Rx refills may take up to 3 business days. We ask that you follow-up with your pharmacy.\  Patient's daughter Julie Silva (callback number [phone])rjoopwh again to follow up on medication. She stated that since it is a prophylactic antibiotic she is afraid that her mother will get a UTI if she goes too long without her medication and it is not maintained. She stated they have been trying to get this filled for days.

## 2023-04-21 NOTE — Telephone Encounter (Signed)
 There is already a pended medication for this medication, this is a follow up call.

## 2023-04-28 ENCOUNTER — Ambulatory Visit: Payer: Self-pay

## 2023-04-28 MED ORDER — CEFDINIR 250 MG/5ML PO SUSR: 250.0000 mg | Freq: Every day | ORAL | 1 refills | Status: DC

## 2023-04-28 NOTE — Telephone Encounter (Signed)
 Pharmacy has not received this RX, please re-send ASAP. Routing to DOD since PCP is out today

## 2023-04-28 NOTE — Telephone Encounter (Signed)
 Copied from CRM [id number]. Topic: Clinical - Prescription Issue >> Apr 28, 2023  3:26 PM Julie Silva wrote: Reason for CRM: Patient's daughter Julie Silva stated that patient has not received her medication cefdinir  (OMNICEF ) 250 MG/5ML suspension [[id number]]. Patient has not had this medication for two weeks and daughter is concerned that patient has developed an UTI. Patient is wondering why she have not gotten a response back  Spoke with patient's daughter; states she has been waiting two weeks for the cefdinir  to be called in.  This nurse called [facility] and spoke to MA.  Medication to be sent to pharmacy today.    Answer Assessment - Initial Assessment Questions 1. DRUG NAME: "What medicine do you need to have refilled?"     Cefdinir  2. REFILLS REMAINING: "How many refills are remaining?" (Note: The label on the medicine or pill bottle will show how many refills are remaining. If there are no refills remaining, then a renewal may be needed.)     none 3. EXPIRATION DATE: "What is the expiration date?" (Note: The label states when the prescription will expire, and thus can no longer be refilled.)     na 4. PRESCRIBING HCP: "Who prescribed it?" Reason: If prescribed by specialist, call should be referred to that group.     PCP; has been trying to get this med for 2 weeks 5. SYMPTOMS: "Do you have any symptoms?"     "Not yet" 6. PREGNANCY: "Is there any chance that you are pregnant?" "When was your last menstrual period?"     na  Protocols used: Medication Refill and Renewal Call-A-AH

## 2023-05-04 NOTE — Telephone Encounter (Signed)
 Medication was refilled.

## 2023-05-08 ENCOUNTER — Other Ambulatory Visit: Payer: Self-pay | Admitting: Internal Medicine

## 2023-05-08 DIAGNOSIS — J449 Chronic obstructive pulmonary disease, unspecified: Secondary | ICD-10-CM

## 2023-05-08 DIAGNOSIS — R64 Cachexia: Secondary | ICD-10-CM

## 2023-05-11 ENCOUNTER — Other Ambulatory Visit: Payer: Self-pay | Admitting: Internal Medicine

## 2023-05-11 ENCOUNTER — Encounter: Payer: Self-pay | Admitting: Internal Medicine

## 2023-05-11 DIAGNOSIS — J449 Chronic obstructive pulmonary disease, unspecified: Secondary | ICD-10-CM

## 2023-05-11 DIAGNOSIS — R64 Cachexia: Secondary | ICD-10-CM

## 2023-05-11 NOTE — Telephone Encounter (Signed)
 Copied from CRM [id number]. Topic: Clinical - Medication Question >> May 11, 2023 11:28 AM Alyse July wrote: Reason for CRM: Patient daughter would like an update on medication status as patient is out of medication and has been unable to eat as a result. Medication was called in on Monday and is still currently not available at the pharmacy. Please contact patient daughter to advise [phone]

## 2023-05-12 ENCOUNTER — Other Ambulatory Visit: Payer: Self-pay

## 2023-05-12 DIAGNOSIS — J449 Chronic obstructive pulmonary disease, unspecified: Secondary | ICD-10-CM

## 2023-05-12 DIAGNOSIS — R64 Cachexia: Secondary | ICD-10-CM

## 2023-05-12 MED ORDER — DRONABINOL 2.5 MG PO CAPS: 2.5000 mg | ORAL_CAPSULE | Freq: Two times a day (BID) | ORAL | 0 refills | Status: DC

## 2023-05-12 NOTE — Telephone Encounter (Signed)
 Copied from CRM [id number]. Topic: Clinical - Medication Question >> May 12, 2023 10:24 AM Orien Bird wrote: Reason for CRM: Patient daughter is wanting to know can her mother get her prescription sent in on today she has been out since Thursday please assist. Call back [phone]

## 2023-05-12 NOTE — Telephone Encounter (Signed)
I have not seen her in over a year

## 2023-05-15 ENCOUNTER — Telehealth: Payer: Self-pay

## 2023-05-15 NOTE — Telephone Encounter (Signed)
 Pharmacy Patient Advocate Encounter  Received notification from [facility] that Prior Authorization for Lidocaine  5% patches  has been CANCELLED due to previous denial- patient notified that 4% can be bought OTC   PA #/Case ID/Reference #: [id number]

## 2023-05-16 ENCOUNTER — Ambulatory Visit: Admitting: Internal Medicine

## 2023-05-17 ENCOUNTER — Encounter: Payer: Self-pay | Admitting: Emergency Medicine

## 2023-05-17 ENCOUNTER — Other Ambulatory Visit (HOSPITAL_COMMUNITY): Payer: Self-pay

## 2023-05-17 ENCOUNTER — Ambulatory Visit (INDEPENDENT_AMBULATORY_CARE_PROVIDER_SITE_OTHER): Admitting: Emergency Medicine

## 2023-05-17 VITALS — BP 144/78 | HR 96 | Temp 98.0°F | Ht 64.0 in | Wt 118.0 lb

## 2023-05-17 DIAGNOSIS — J449 Chronic obstructive pulmonary disease, unspecified: Secondary | ICD-10-CM | POA: Diagnosis not present

## 2023-05-17 DIAGNOSIS — D6869 Other thrombophilia: Secondary | ICD-10-CM | POA: Diagnosis not present

## 2023-05-17 DIAGNOSIS — I480 Paroxysmal atrial fibrillation: Secondary | ICD-10-CM | POA: Diagnosis not present

## 2023-05-17 DIAGNOSIS — R339 Retention of urine, unspecified: Secondary | ICD-10-CM | POA: Diagnosis not present

## 2023-05-17 DIAGNOSIS — R64 Cachexia: Secondary | ICD-10-CM | POA: Diagnosis not present

## 2023-05-17 MED ORDER — DRONABINOL 2.5 MG PO CAPS
2.5000 mg | ORAL_CAPSULE | Freq: Two times a day (BID) | ORAL | 0 refills | Status: DC
  Filled 2023-05-17: qty 8, 4d supply, fill #0

## 2023-05-17 MED ORDER — APIXABAN 2.5 MG PO TABS: 2.5000 mg | ORAL_TABLET | Freq: Two times a day (BID) | ORAL | 1 refills | Status: DC

## 2023-05-17 MED ORDER — CEFDINIR 250 MG/5ML PO SUSR: 250.0000 mg | Freq: Every day | ORAL | 1 refills | Status: DC

## 2023-05-17 NOTE — Progress Notes (Signed)
 Julie Silva 89 y.o.   Chief Complaint  Patient presents with   Medication Refill    Patient here for medication refills, for eliquis , dronabinol , cefdinir . LOV 03/16/2022    HISTORY OF PRESENT ILLNESS: This is a 89 y.o. female here for medication refill.  Patient of Dr. Sandra Crouch.  Has not had an office visit for over a year. Requesting new prescription for Eliquis , dronabinol  and cefdinir . Accompanied by daughter. No other complaints or medical concerns today.  HPI   Prior to Admission medications   Medication Sig Start Date End Date Taking? Authorizing Provider  acetaminophen  (TYLENOL ) 500 MG tablet Take 500 mg by mouth 2 (two) times daily.   Yes [provider]  apixaban  (ELIQUIS ) 2.5 MG TABS tablet Take 1 tablet (2.5 mg total) by mouth 2 (two) times daily. 03/16/22  Yes Arcadio Knuckles, MD  bimatoprost (LUMIGAN) 0.01 % SOLN Place 1 drop into both eyes at bedtime.   Yes [provider]  cefdinir  (OMNICEF ) 250 MG/5ML suspension Take 5 mLs (250 mg total) by mouth daily. 04/28/23  Yes Burns, Beckey Bourgeois, MD  dorzolamide -timolol  (COSOPT ) 22.3-6.8 MG/ML ophthalmic solution Place 1 drop into both eyes 2 (two) times daily.   Yes [provider]  dronabinol  (MARINOL ) 2.5 MG capsule Take 1 capsule (2.5 mg total) by mouth 2 (two) times daily. 05/12/23  Yes Arcadio Knuckles, MD  estradiol (ESTRACE) 0.1 MG/GM vaginal cream Place 1 Applicatorful vaginally every other day.   Yes [provider]  gabapentin  (NEURONTIN ) 300 MG capsule Take 1 capsule (300 mg total) by mouth 2 (two) times daily. 06/15/22 06/10/23 Yes Camara, Nancye Azure, MD  guaifenesin  (ROBITUSSIN) 100 MG/5ML syrup Take 200 mg by mouth 3 (three) times daily.   Yes [provider]  lidocaine  (LIDODERM ) 5 % Place 1 patch onto the skin daily. Remove & Discard patch within 12 hours or as directed by MD 01/18/23  Yes Cobb, Mariah Shines, NP  Multiple Vitamins-Minerals (PRESERVISION AREDS 2) CAPS Take 1 tablet  by mouth 2 (two) times daily.   Yes [provider]  omeprazole  (PRILOSEC) 20 MG capsule TAKE 1 CAPSULE BY MOUTH EVERY DAY Patient taking differently: Take 20 mg by mouth daily. TAKE 1 CAPSULE BY MOUTH EVERY DAY 10/26/21  Yes Arcadio Knuckles, MD  sodium chloride  HYPERTONIC 3 % nebulizer solution INHALE 1 VIAL VIA NEBULIZER ONCE A DAY 07/19/22  Yes Arcadio Knuckles, MD  traMADol  (ULTRAM ) 50 MG tablet Take 50 mg by mouth as needed for moderate pain (pain score 4-6). For kidney stones   Yes [provider]  Wheat Dextrin (BENEFIBER PO) Take 5 mLs by mouth 2 (two) times daily.   Yes [provider]  Catheters MISC Replace catheter twice daily. 07/12/17   Arcadio Knuckles, MD  ondansetron  (ZOFRAN -ODT) 4 MG disintegrating tablet Take 1 tablet (4 mg total) by mouth every 8 (eight) hours as needed for nausea or vomiting. Patient not taking: Reported on 05/17/2023 02/28/22   Maggie Schooner, MD  prochlorperazine  (COMPAZINE ) 10 MG tablet Take 2.5 mg by mouth every 4 (four) hours as needed for nausea or vomiting. Patient not taking: Reported on 05/17/2023 01/19/22   [provider]    Allergies  Allergen Reactions   Codeine Other (See Comments)    Passed out   Colchicine  Diarrhea    Patient Active Problem List   Diagnosis Date Noted   Acute diverticulitis of intestine 02/25/2022   Acute diverticulitis 02/24/2022   Chronic kidney  disease, stage 3a (HCC) 02/24/2022   Nausea in adult 08/17/2021   Encounter for palliative care involving management of pain 07/21/2021   Chronic respiratory failure with hypoxia (HCC) 06/24/2021   Malignant cachexia (HCC) 06/22/2021   Pulmonary cachexia due to COPD (HCC) 06/22/2021   Iron  deficiency anemia 04/14/2021   Bronchiectasis (HCC) 04/13/2021   Paroxysmal atrial fibrillation (HCC) 04/13/2021   Atherosclerosis of aorta (HCC) 07/31/2020   Pseudogout involving multiple joints 12/11/2018   Abnormal CT scan, chest 10/06/2015    Intraductal papillary adenocarcinoma of female breast 04/29/2015   Abnormal chest x-ray with multiple lung nodules 04/29/2015   Second degree AV block 03/25/2014   Gastroesophageal reflux disease with esophagitis 07/31/2013   Dysphagia 07/31/2013   Hyperlipidemia with target LDL less than 130 05/07/2013   SVT (supraventricular tachycardia) (HCC) 04/16/2013   AKI (acute kidney injury) (HCC) 02/03/2013   Urinary retention 02/03/2013   Osteopenia 02/03/2013    Past Medical History:  Diagnosis Date   Anemia    Aortic atherosclerosis (HCC)    Arthritis    Osteoporosis, neck stiffness   Atrial fibrillation (HCC)    Bleeds easily [facility])    "father was a hemophiliac" - pt not being bothered in recent years.   CAD (coronary artery disease)    Calcium deposit in bursa of knee 2017   Cancer (HCC)    skin cancer "basal cell".   Chicken pox    Cyst, Baker's knee    left knee- tx. Cortisone injection 05-05-15 in office- "improved pain relief and swelling left ankle and knee"   Diverticulitis    Diverticulosis    Dysrhythmia    Dr. McAlhany-cardiology   GERD (gastroesophageal reflux disease)    Glaucoma of both eyes    Gout    Hearing loss of both ears    Bilateral ears- hearing aids used- right ear hearing betther than left.   Hiatal hernia    History of blood transfusion 1957   "lots; most related to my periods"-last 1968 -s/p Hysterectomy   Migraines    "years ago" (02/04/2013)   Motion sickness    back seat - cars, sudden movements   PAF (paroxysmal atrial fibrillation) (HCC)    Pancreatic cyst    Pelvic floor dysfunction    PONV (postoperative nausea and vomiting)    Risk for falls    "wobbley" per daughter   Self-catheterizes urinary bladder    "daily- retained urine and chronic UTI"   SVT (supraventricular tachycardia) (HCC)    Dr. Crissie Dome. Taylor-follows "loop recorder left chest"   Swallowing difficulty    freq occ. mostley liquids   Urine incontinence    UTI (urinary tract  infection)    Chronic Urinary tract infections- tx Macrobid  daily.   Vertigo    Wears hearing aid in both ears     Past Surgical History:  Procedure Laterality Date   APPENDECTOMY  01/03/1946   BLADDER SURGERY  01/03/1993   Repair-    BREAST BIOPSY Bilateral    "both were fine"   CATARACT EXTRACTION W/ INTRAOCULAR LENS IMPLANT Left    CATARACT EXTRACTION W/PHACO Right 10/16/2018   Procedure: CATARACT EXTRACTION PHACO AND INTRAOCULAR LENS PLACEMENT (IOC)  RIGHT MiLoop diabetes 01:05.8  21.0%  13.77;  Surgeon: Clair Crews, MD;  Location: [facility];  Service: Ophthalmology;  Laterality: Right;  BS tends to drop in AM and would like early surgery so she can get home to eat, please   CHOLECYSTECTOMY  01/04/2012   COMBINED  HYSTERECTOMY ABDOMINAL W/ A&P REPAIR / OOPHORECTOMY     DILATION AND CURETTAGE OF UTERUS     EP IMPLANTABLE DEVICE N/A 06/19/2014   Procedure: Loop Recorder Insertion;  Surgeon: Tammie Fall, MD;  Location: [facility];  Service: Cardiovascular;  Laterality: N/A;   EXCISION OF BREAST BIOPSY Right 05/13/2015   Procedure: RIGHT BREAST EXCISIONAL BIOPSY x2;  Surgeon: Oralee Billow, MD;  Location: [facility];  Service: General;  Laterality: Right;   PACEMAKER IMPLANT  02/2020   TONSILLECTOMY  01/04/1944   TOTAL ABDOMINAL HYSTERECTOMY     VAGINAL HYSTERECTOMY  01/03/1966   vaginal mesh      Social History   Socioeconomic History   Marital status: Widowed    Spouse name: Not on file   Number of children: 4   Years of education: 12   Highest education level: Not on file  Occupational History   Occupation: Air cabin crew  Tobacco Use   Smoking status: Never    Passive exposure: Past   Smokeless tobacco: Never  Vaping Use   Vaping status: Never Used  Substance and Sexual Activity   Alcohol use: No    Alcohol/week: 0.0 standard drinks of alcohol   Drug use: No   Sexual activity: Never  Other Topics Concern   Not on file  Social  History Narrative   Lives in her home an family stays with her.    Retired Diplomatic Services operational officer.     Has 4 daughters.    Right handed   Caffeine-none   Social Drivers of Health   Financial Resource Strain: Low Risk  (02/12/2021)   [facility]   Overall Financial Resource Strain (CARDIA)    Difficulty of Paying Living Expenses: Not hard at all  Food Insecurity: No Food Insecurity (03/03/2022)   Hunger Vital Sign    Worried About Running Out of Food in the Last Year: Never true    Ran Out of Food in the Last Year: Never true  Transportation Needs: No Transportation Needs (03/03/2022)   PRAPARE - Administrator, Civil Service (Medical): No    Lack of Transportation (Non-Medical): No  Physical Activity: Inactive (10/26/2017)   Exercise Vital Sign    Days of Exercise per Week: 0 days    Minutes of Exercise per Session: 0 min  Stress: No Stress Concern Present (10/26/2017)   [employer] of Occupational Health - Occupational Stress Questionnaire    Feeling of Stress : Not at all  Social Connections: Moderately Integrated (10/26/2017)   Social Connection and Isolation Panel [NHANES]    Frequency of Communication with Friends and Family: More than three times a week    Frequency of Social Gatherings with Friends and Family: More than three times a week    Attends Religious Services: 1 to 4 times per year    Active Member of [employer] or Organizations: Yes    Attends Banker Meetings: 1 to 4 times per year    Marital Status: Widowed  Intimate Partner Violence: Not At Risk (02/24/2022)   Humiliation, Afraid, Rape, and Kick questionnaire    Fear of Current or Ex-Partner: No    Emotionally Abused: No    Physically Abused: No    Sexually Abused: No    Family History  Problem Relation Age of Onset   Stroke Father    CVA Father    Ovarian cancer Mother    Cancer - Other Sister  Breast   Breast cancer Sister    Cancer - Other  Brother        Throat   Cancer - Other Sister        Throat   Brain cancer Sister    Cancer - Other Other        Brain   Breast cancer Other      Review of Systems  Constitutional: Negative.  Negative for chills and fever.  HENT:  Negative for congestion and sore throat.   Respiratory:  Negative for cough and shortness of breath.   Cardiovascular:  Negative for chest pain and palpitations.  Gastrointestinal:  Negative for abdominal pain, nausea and vomiting.  Genitourinary: Negative.  Negative for dysuria and hematuria.  Skin: Negative.  Negative for rash.  Neurological:  Negative for dizziness and headaches.    Vitals:   05/17/23 1326  BP: (!) 144/78  Pulse: 96  Temp: 98 F (36.7 C)  SpO2: 97%    Physical Exam Vitals reviewed.  Constitutional:      Appearance: Normal appearance.  HENT:     Head: Normocephalic.     Mouth/Throat:     Mouth: Mucous membranes are moist.     Pharynx: Oropharynx is clear.  Eyes:     Extraocular Movements: Extraocular movements intact.  Cardiovascular:     Rate and Rhythm: Normal rate.     Pulses: Normal pulses.     Heart sounds: Normal heart sounds.  Pulmonary:     Effort: Pulmonary effort is normal.     Breath sounds: Normal breath sounds.  Skin:    General: Skin is warm and dry.     Capillary Refill: Capillary refill takes less than 2 seconds.  Neurological:     Mental Status: She is alert and oriented to person, place, and time.  Psychiatric:        Mood and Affect: Mood normal.        Behavior: Behavior normal.      ASSESSMENT & PLAN: A total of 32 minutes was spent with the patient and counseling/coordination of care regarding preparing for this visit, review of most recent office visit notes, review of multiple chronic medical conditions and their management, review of all medications, review of most recent bloodwork results, review of health maintenance items, education on nutrition, prognosis, documentation, and need  for follow up.   Problem List Items Addressed This Visit       Cardiovascular and Mediastinum   Paroxysmal atrial fibrillation (HCC) - Primary   - Following with cardiology. Ordered for stress echo. On amiodarone since feb 2023, no events since starting. Cont amiodarone 200mg  daily and Eliquis  2.5 BID.       Relevant Medications   apixaban  (ELIQUIS ) 2.5 MG TABS tablet     Respiratory   Pulmonary cachexia due to COPD (HCC)   Relevant Medications   dronabinol  (MARINOL ) 2.5 MG capsule     Genitourinary   Urinary retention   Pt to continue I&O cath bid  Continue UTI prophylaxis treatment with cefdinir  once a day        Other   Malignant cachexia (HCC)   Relevant Medications   dronabinol  (MARINOL ) 2.5 MG capsule   Other Visit Diagnoses       Acquired thrombophilia (HCC)       Relevant Medications   apixaban  (ELIQUIS ) 2.5 MG TABS tablet      Patient Instructions  Health Maintenance After Age 65 After age 61, you are at a higher  risk for certain long-term diseases and infections as well as injuries from falls. Falls are a major cause of broken bones and head injuries in people who are older than age 30. Getting regular preventive care can help to keep you healthy and well. Preventive care includes getting regular testing and making lifestyle changes as recommended by your health care provider. Talk with your health care provider about: Which screenings and tests you should have. A screening is a test that checks for a disease when you have no symptoms. A diet and exercise plan that is right for you. What should I know about screenings and tests to prevent falls? Screening and testing are the best ways to find a health problem early. Early diagnosis and treatment give you the best chance of managing medical conditions that are common after age 16. Certain conditions and lifestyle choices may make you more likely to have a fall. Your health care provider may recommend: Regular  vision checks. Poor vision and conditions such as cataracts can make you more likely to have a fall. If you wear glasses, make sure to get your prescription updated if your vision changes. Medicine review. Work with your health care provider to regularly review all of the medicines you are taking, including over-the-counter medicines. Ask your health care provider about any side effects that may make you more likely to have a fall. Tell your health care provider if any medicines that you take make you feel dizzy or sleepy. Strength and balance checks. Your health care provider may recommend certain tests to check your strength and balance while standing, walking, or changing positions. Foot health exam. Foot pain and numbness, as well as not wearing proper footwear, can make you more likely to have a fall. Screenings, including: Osteoporosis screening. Osteoporosis is a condition that causes the bones to get weaker and break more easily. Blood pressure screening. Blood pressure changes and medicines to control blood pressure can make you feel dizzy. Depression screening. You may be more likely to have a fall if you have a fear of falling, feel depressed, or feel unable to do activities that you used to do. Alcohol use screening. Using too much alcohol can affect your balance and may make you more likely to have a fall. Follow these instructions at home: Lifestyle Do not drink alcohol if: Your health care provider tells you not to drink. If you drink alcohol: Limit how much you have to: 0-1 drink a day for women. 0-2 drinks a day for men. Know how much alcohol is in your drink. In the [place] one drink equals one 12 oz bottle of beer (355 mL), one 5 oz glass of wine (148 mL), or one 1 oz glass of hard liquor (44 mL). Do not use any products that contain nicotine or tobacco. These products include cigarettes, chewing tobacco, and vaping devices, such as e-cigarettes. If you need help quitting, ask  your health care provider. Activity  Follow a regular exercise program to stay fit. This will help you maintain your balance. Ask your health care provider what types of exercise are appropriate for you. If you need a cane or walker, use it as recommended by your health care provider. Wear supportive shoes that have nonskid soles. Safety  Remove any tripping hazards, such as rugs, cords, and clutter. Install safety equipment such as grab bars in bathrooms and safety rails on stairs. Keep rooms and walkways well-lit. General instructions Talk with your health care provider about your risks  for falling. Tell your health care provider if: You fall. Be sure to tell your health care provider about all falls, even ones that seem minor. You feel dizzy, tiredness (fatigue), or off-balance. Take over-the-counter and prescription medicines only as told by your health care provider. These include supplements. Eat a healthy diet and maintain a healthy weight. A healthy diet includes low-fat dairy products, low-fat (lean) meats, and fiber from whole grains, beans, and lots of fruits and vegetables. Stay current with your vaccines. Schedule regular health, dental, and eye exams. Summary Having a healthy lifestyle and getting preventive care can help to protect your health and wellness after age 39. Screening and testing are the best way to find a health problem early and help you avoid having a fall. Early diagnosis and treatment give you the best chance for managing medical conditions that are more common for people who are older than age 61. Falls are a major cause of broken bones and head injuries in people who are older than age 32. Take precautions to prevent a fall at home. Work with your health care provider to learn what changes you can make to improve your health and wellness and to prevent falls. This information is not intended to replace advice given to you by your health care provider. Make  sure you discuss any questions you have with your health care provider. Document Revised: 05/11/2020 Document Reviewed: 05/11/2020 Elsevier Patient Education  2024 Elsevier Inc.     Maryagnes Small, MD  Primary Care at [facility]

## 2023-05-17 NOTE — Assessment & Plan Note (Signed)
-   Following with cardiology. Ordered for stress echo. On amiodarone since feb 2023, no events since starting. Cont amiodarone '200mg'$  daily and Eliquis 2.5 BID.  ?

## 2023-05-17 NOTE — Patient Instructions (Signed)
 Health Maintenance After Age 88 After age 4, you are at a higher risk for certain long-term diseases and infections as well as injuries from falls. Falls are a major cause of broken bones and head injuries in people who are older than age 47. Getting regular preventive care can help to keep you healthy and well. Preventive care includes getting regular testing and making lifestyle changes as recommended by your health care provider. Talk with your health care provider about: Which screenings and tests you should have. A screening is a test that checks for a disease when you have no symptoms. A diet and exercise plan that is right for you. What should I know about screenings and tests to prevent falls? Screening and testing are the best ways to find a health problem early. Early diagnosis and treatment give you the best chance of managing medical conditions that are common after age 37. Certain conditions and lifestyle choices may make you more likely to have a fall. Your health care provider may recommend: Regular vision checks. Poor vision and conditions such as cataracts can make you more likely to have a fall. If you wear glasses, make sure to get your prescription updated if your vision changes. Medicine review. Work with your health care provider to regularly review all of the medicines you are taking, including over-the-counter medicines. Ask your health care provider about any side effects that may make you more likely to have a fall. Tell your health care provider if any medicines that you take make you feel dizzy or sleepy. Strength and balance checks. Your health care provider may recommend certain tests to check your strength and balance while standing, walking, or changing positions. Foot health exam. Foot pain and numbness, as well as not wearing proper footwear, can make you more likely to have a fall. Screenings, including: Osteoporosis screening. Osteoporosis is a condition that causes  the bones to get weaker and break more easily. Blood pressure screening. Blood pressure changes and medicines to control blood pressure can make you feel dizzy. Depression screening. You may be more likely to have a fall if you have a fear of falling, feel depressed, or feel unable to do activities that you used to do. Alcohol use screening. Using too much alcohol can affect your balance and may make you more likely to have a fall. Follow these instructions at home: Lifestyle Do not drink alcohol if: Your health care provider tells you not to drink. If you drink alcohol: Limit how much you have to: 0-1 drink a day for women. 0-2 drinks a day for men. Know how much alcohol is in your drink. In the [place] one drink equals one 12 oz bottle of beer (355 mL), one 5 oz glass of wine (148 mL), or one 1 oz glass of hard liquor (44 mL). Do not use any products that contain nicotine or tobacco. These products include cigarettes, chewing tobacco, and vaping devices, such as e-cigarettes. If you need help quitting, ask your health care provider. Activity  Follow a regular exercise program to stay fit. This will help you maintain your balance. Ask your health care provider what types of exercise are appropriate for you. If you need a cane or walker, use it as recommended by your health care provider. Wear supportive shoes that have nonskid soles. Safety  Remove any tripping hazards, such as rugs, cords, and clutter. Install safety equipment such as grab bars in bathrooms and safety rails on stairs. Keep rooms and walkways  well-lit. General instructions Talk with your health care provider about your risks for falling. Tell your health care provider if: You fall. Be sure to tell your health care provider about all falls, even ones that seem minor. You feel dizzy, tiredness (fatigue), or off-balance. Take over-the-counter and prescription medicines only as told by your health care provider. These include  supplements. Eat a healthy diet and maintain a healthy weight. A healthy diet includes low-fat dairy products, low-fat (lean) meats, and fiber from whole grains, beans, and lots of fruits and vegetables. Stay current with your vaccines. Schedule regular health, dental, and eye exams. Summary Having a healthy lifestyle and getting preventive care can help to protect your health and wellness after age 11. Screening and testing are the best way to find a health problem early and help you avoid having a fall. Early diagnosis and treatment give you the best chance for managing medical conditions that are more common for people who are older than age 28. Falls are a major cause of broken bones and head injuries in people who are older than age 48. Take precautions to prevent a fall at home. Work with your health care provider to learn what changes you can make to improve your health and wellness and to prevent falls. This information is not intended to replace advice given to you by your health care provider. Make sure you discuss any questions you have with your health care provider. Document Revised: 05/11/2020 Document Reviewed: 05/11/2020 Elsevier Patient Education  2024 [employer].

## 2023-05-17 NOTE — Assessment & Plan Note (Signed)
 Pt to continue I&O cath bid  Continue UTI prophylaxis treatment with cefdinir  once a day

## 2023-05-19 ENCOUNTER — Other Ambulatory Visit: Payer: Self-pay | Admitting: Emergency Medicine

## 2023-05-20 ENCOUNTER — Other Ambulatory Visit: Payer: Self-pay | Admitting: Internal Medicine

## 2023-05-25 DIAGNOSIS — H353132 Nonexudative age-related macular degeneration, bilateral, intermediate dry stage: Secondary | ICD-10-CM | POA: Diagnosis not present

## 2023-05-25 DIAGNOSIS — M3501 Sicca syndrome with keratoconjunctivitis: Secondary | ICD-10-CM | POA: Diagnosis not present

## 2023-05-25 DIAGNOSIS — H353190 Nonexudative age-related macular degeneration, unspecified eye, stage unspecified: Secondary | ICD-10-CM | POA: Diagnosis not present

## 2023-05-25 DIAGNOSIS — H401132 Primary open-angle glaucoma, bilateral, moderate stage: Secondary | ICD-10-CM | POA: Diagnosis not present

## 2023-06-02 ENCOUNTER — Other Ambulatory Visit: Payer: Self-pay | Admitting: Neurology

## 2023-06-07 ENCOUNTER — Telehealth: Payer: Self-pay

## 2023-06-07 NOTE — Telephone Encounter (Signed)
*  Pulm  Called patient pharmacy about repeated requests for PA on Lidocain 5% patches. Left message that patient has previously been informed that this will not be covered and they can purchase the 4% OTC if they wish or pay out of pocked for the 5%. Requested pharmacy put a note on the script for 5% so maybe we will lessen the amount of requests we receive.

## 2023-06-07 NOTE — Telephone Encounter (Signed)
 Dr. Samara Crest,  Your note stated: Continue with gabapentin  300 mg bid (taking 3 100mg  tabs bid)  Her rx son med list says take 1  300mg  cap bid  What strength did you what her to have the 300tab or 100 and take 3?

## 2023-06-11 ENCOUNTER — Other Ambulatory Visit: Payer: Self-pay | Admitting: Emergency Medicine

## 2023-06-11 NOTE — Telephone Encounter (Signed)
Send to PCP please

## 2023-06-13 ENCOUNTER — Telehealth: Payer: Self-pay | Admitting: Internal Medicine

## 2023-06-13 NOTE — Telephone Encounter (Signed)
 Copied from CRM [id number]. Topic: General - Other >> Jun 13, 2023 12:49 PM Luane Rumps D wrote: Reason for CRM: Dominique with [facility], in reference to requested office notes. They received clinical notes from 5/14 but no verbage regarding catheters. She stated they need specific verbage related to use of catheters.  fax: [phone] phone: [phone]

## 2023-06-14 NOTE — Telephone Encounter (Signed)
 Please send to PCP Dr. Sandra Crouch

## 2023-06-16 ENCOUNTER — Encounter: Payer: Self-pay | Admitting: Internal Medicine

## 2023-06-16 ENCOUNTER — Encounter: Payer: Self-pay | Admitting: Emergency Medicine

## 2023-06-16 NOTE — Telephone Encounter (Signed)
 Copied from CRM [id number]. Topic: Medical Record Request - Other >> Jun 16, 2023  2:17 PM Dewanda Foots wrote: Reason for CRM: Evette from [facility] sent over the records for urology but states that there is no information about the catheter. Requesting that PCP send them notes about the catheter, stating that she has one please.  Please contact [phone] [phone]

## 2023-06-16 NOTE — Telephone Encounter (Signed)
**Note De-identified  [place] Obfuscation** Please advise 

## 2023-06-20 ENCOUNTER — Telehealth: Payer: Self-pay | Admitting: Internal Medicine

## 2023-06-20 ENCOUNTER — Other Ambulatory Visit: Payer: Self-pay | Admitting: Emergency Medicine

## 2023-06-20 NOTE — Telephone Encounter (Signed)
 Copied from CRM [id number]. Topic: Clinical - Medication Refill >> Jun 20, 2023  3:14 PM Alyse July wrote: Medication: Cefdinir  (OMNICEF ) 250 MG/5ML suspension (Only good for 10 days and patient is unable to take pill which is the reason for the suspension.)  Has the patient contacted their pharmacy? Yes (Agent: If no, request that the patient contact the pharmacy for the refill. If patient does not wish to contact the pharmacy document the reason why and proceed with request.) (Agent: If yes, when and what did the pharmacy advise?)  This is the patient's preferred pharmacy:  [facility] [phone] [facility], [place] - [address] [place] [place] [address] Phone: [phone] Fax: [phone]   Is this the correct pharmacy for this prescription? Yes If no, delete pharmacy and type the correct one.   Has the prescription been filled recently? No  Is the patient out of the medication? Yes  Has the patient been seen for an appointment in the last year OR does the patient have an upcoming appointment? Yes  Can we respond through MyChart? Yes  Agent: Please be advised that Rx refills may take up to 3 business days. We ask that you follow-up with your pharmacy.

## 2023-06-21 ENCOUNTER — Ambulatory Visit: Payer: Medicare Other | Admitting: Neurology

## 2023-06-21 MED ORDER — CEFDINIR 250 MG/5ML PO SUSR: 250.0000 mg | Freq: Every day | ORAL | 1 refills | Status: DC

## 2023-06-27 ENCOUNTER — Encounter: Payer: Self-pay | Admitting: Neurology

## 2023-06-27 ENCOUNTER — Ambulatory Visit (INDEPENDENT_AMBULATORY_CARE_PROVIDER_SITE_OTHER): Admitting: Neurology

## 2023-06-27 VITALS — BP 139/63 | HR 92 | Resp 15

## 2023-06-27 DIAGNOSIS — G44209 Tension-type headache, unspecified, not intractable: Secondary | ICD-10-CM | POA: Diagnosis not present

## 2023-06-27 DIAGNOSIS — F02A0 Dementia in other diseases classified elsewhere, mild, without behavioral disturbance, psychotic disturbance, mood disturbance, and anxiety: Secondary | ICD-10-CM | POA: Diagnosis not present

## 2023-06-27 DIAGNOSIS — G301 Alzheimer's disease with late onset: Secondary | ICD-10-CM | POA: Diagnosis not present

## 2023-06-27 MED ORDER — GABAPENTIN 300 MG PO CAPS: 300.0000 mg | ORAL_CAPSULE | Freq: Two times a day (BID) | ORAL | 3 refills | Status: AC

## 2023-06-27 NOTE — Progress Notes (Signed)
 [facility]  PATIENT: Julie Silva DOB: [birth year]  REQUESTING CLINICIAN: Joshua Debby CROME, MD HISTORY FROM: Daughter  REASON FOR VISIT: Poor appetite, poor sleep, memory deficit, r/o dementia    HISTORICAL  CHIEF COMPLAINT:  Chief Complaint  Patient presents with   Headache    RM12, DAUGHTER PRESENT, HA:   Tremors    RM12, DAUGHTER PRESENT, TREMOR:PT STATED THAT TREMORS HAVE BEEN WELL CONTROLLED LATELY    INTERVAL HISTORY 06/27/2023:  Patient presents today for follow-up, she is accompanied by her daughter.  Last visit was a year ago, since then she has been doing well, daughter tells me that she is stable, her weight is stable, actually she gained some weight.  Memory is stable, sleep is good, she is still very picky with her food. They have stopped Dronabinol  because the pharmacy did not have it. They do have 24/7 care and family do visit her often.  They do not have any other complaints or concerns at the moment.   INTERVAL HISTORY 06/15/2022:  Patient presents today for follow-up, she is accompanied by her daughter.  Last visit was 6 months ago.  Since then she has been doing ok.  She was seen in the ED twice for diverticulitis. She reports headaches has markedly improved since being on gabapentin  300 mg twice daily.  Sometimes she does not have occasional headaches but the frequency has improved.  Daughter reports that her memory is okay, sometimes patient is forgetful and lately daughter has noted confusion, patient believes that she owns a second house that look very similar to her current house with the same address.  Sometimes she asks to go to second home.  Daughter has been reorienting her but she said that sometimes it can cause agitation.  Sometime daughter will drive patient to her house and bring her back.  Denies any recent fall.  She also reports that her appetite has improved.  She eats small meals but it frequently.    INTERVAL HISTORY 12/09/2021:   Patient presents today for follow up, accompanied by her daughter Bruna.  Since last visit, we have started gabapentin  for headache.  She reports that gabapentin  has markedly improved her headaches.  Currently she is on gabapentin  100 mg in the morning and 200 mg at night, still continue to have occasional headaches but not as bad as before.  She reports that laying down in 1 position does cause her to have sharp pain on top and in the back of the head.  In terms of her memory she reported to be stable, does not have any new concerns.  She feels like her appetite has increased a little bit she has gained some weight.   HISTORY OF PRESENT ILLNESS:  This is a 89 year old woman past medical history of atrial fibrillation, pelvic floor dysfunction with self-catheterization, history of multiple UTIs, history of poor oral intake and malnutrition, hearing loss, who is presenting for overall decline in the past year but worse in the past few months, and poor sleep.   In brief daughter reports patient have a history of multiple UTIs, history of poor p.o. intake and weight loss for many years.  Her symptoms have been getting worse for the past year and a half and acutely worse in the past 3 to 4 months after being admitted in the hospital for urinary tract infection.  Daughter reported that in February patient has rapid A-fib, which was controlled with amiodarone.  At that time also she was  found to have a resistant UTI, admitted to hospital for IV antibiotic and discharged on cefdinir .  From there she also had lung infection, bronchiectasis and since discharge from the hospital her appetite has been very poor.  She is also very weak, a year ago she used to walk independently but now needs wheelchair or at least a walker.  During this time also she was noted to need supplemental oxygen , but her O2 requirement has lessened.  She still have the supplemental oxygen  but she only needed as needed. In May, she also had  another bout of UTI.   Daughter reports that her sleep is poor, she goes to bed around 9/10 PM but will be restless at night, will toss and turn, sometimes will remove her oxygen  machine.   She is also talking during her sleep and act out her dreams.  Some nights she will get up trying to leave the bedroom or some night, she does not know how to get to her own bedroom, thinking that the bedroom is upstairs where in fact there are no stairs in the house.  Due to her worsening dream, her mirtazapine  was discontinued due to possible side effect REM sleep abnormal behavior.  Currently she is not taking any appetite stimulant, in the past she tried mirtazapine  and Megace . She was on mirtazapine  for the past 4 years but lost of appetite is getting worse.   She did also try supplement like boost but currently not using it.    In terms of her memory, there is also concern of memory loss.  Patient lives with her 2 daughters who help assist her in her activities of daily living.  She currently is not driving, has not driven for the past 2 years.  She needs help with cooking, cleaning, bathing and toileting and also daughter is helping her bills.  She does not have any issue going to familiar places or difficult with names of family members. She also feels like her mood is low.     TBI:   No past history of TBI Stroke:    no past history of stroke Seizures:   no past history of seizures Sleep:   no history of sleep apnea.  Mood:   patient denies anxiety and depression  Functional status: Dependent in most ADLs and IADLs Patient lives with daughter.  Cooking: No Cleaning: No Shopping: No  Bathing: needs help  Toileting:  Patient  Driving: Not driving for the past 2 year  Bills: Daughter   Ever left the stove on by accident?: No Forget how to use items around the house?: No  Getting lost going to familiar places?: No  Forgetting loved ones names?: No  Word finding difficulty? Yes  Sleep: Poor, will  get restless    OTHER MEDICAL CONDITIONS: recurrent UTIs, weight loss, Bronchiectasis, atrial fibrillation    REVIEW OF SYSTEMS: Full 14 system review of systems performed and negative with exception of: as noted in the HPI   ALLERGIES: Allergies  Allergen Reactions   Codeine Other (See Comments)    Passed out   Colchicine  Diarrhea    HOME MEDICATIONS: Outpatient Medications Prior to Visit  Medication Sig Dispense Refill   acetaminophen  (TYLENOL ) 500 MG tablet Take 500 mg by mouth 2 (two) times daily.     apixaban  (ELIQUIS ) 2.5 MG TABS tablet Take 1 tablet (2.5 mg total) by mouth 2 (two) times daily. 180 tablet 1   bimatoprost (LUMIGAN) 0.01 % SOLN Place 1 drop into  both eyes at bedtime.     Catheters MISC Replace catheter twice daily. 180 each 1   cefdinir  (OMNICEF ) 250 MG/5ML suspension Take 5 mLs (250 mg total) by mouth daily. 60 mL 1   dorzolamide -timolol  (COSOPT ) 22.3-6.8 MG/ML ophthalmic solution Place 1 drop into both eyes 2 (two) times daily.     estradiol (ESTRACE) 0.1 MG/GM vaginal cream Place 1 Applicatorful vaginally every other day.     guaifenesin  (ROBITUSSIN) 100 MG/5ML syrup Take 200 mg by mouth 3 (three) times daily.     Multiple Vitamins-Minerals (PRESERVISION AREDS 2) CAPS Take 1 tablet by mouth 2 (two) times daily.     omeprazole  (PRILOSEC) 20 MG capsule TAKE 1 CAPSULE BY MOUTH EVERY DAY (Patient taking differently: Take 20 mg by mouth daily. TAKE 1 CAPSULE BY MOUTH EVERY DAY) 90 capsule 1   ondansetron  (ZOFRAN -ODT) 4 MG disintegrating tablet Take 1 tablet (4 mg total) by mouth every 8 (eight) hours as needed for nausea or vomiting. 30 tablet 0   prochlorperazine  (COMPAZINE ) 10 MG tablet Take 2.5 mg by mouth every 4 (four) hours as needed for nausea or vomiting.     sodium chloride  HYPERTONIC 3 % nebulizer solution INHALE 1 VIAL VIA NEBULIZER ONCE A DAY 750 mL 3   traMADol  (ULTRAM ) 50 MG tablet Take 50 mg by mouth as needed for moderate pain (pain score 4-6). For  kidney stones     Wheat Dextrin (BENEFIBER PO) Take 5 mLs by mouth 2 (two) times daily.     gabapentin  (NEURONTIN ) 300 MG capsule Take 1 capsule (300 mg total) by mouth 2 (two) times daily. 180 capsule 11   dronabinol  (MARINOL ) 2.5 MG capsule Take 1 capsule (2.5 mg total) by mouth 2 (two) times daily. 8 capsule 0   gabapentin  (NEURONTIN ) 300 MG capsule Take 1 capsule (300 mg total) by mouth 2 (two) times daily. 180 capsule 3   lidocaine  (LIDODERM ) 5 % Place 1 patch onto the skin daily. Remove & Discard patch within 12 hours or as directed by MD (Patient not taking: Reported on 06/27/2023) 30 patch 0   No facility-administered medications prior to visit.    PAST MEDICAL HISTORY: Past Medical History:  Diagnosis Date   Anemia    Aortic atherosclerosis (HCC)    Arthritis    Osteoporosis, neck stiffness   Atrial fibrillation (HCC)    Bleeds easily [facility])    father was a hemophiliac - pt not being bothered in recent years.   CAD (coronary artery disease)    Calcium deposit in bursa of knee 2017   Cancer (HCC)    skin cancer basal cell.   Chicken pox    Cyst, Baker's knee    left knee- tx. Cortisone injection 05-05-15 in office- improved pain relief and swelling left ankle and knee   Diverticulitis    Diverticulosis    Dysrhythmia    Dr. McAlhany-cardiology   GERD (gastroesophageal reflux disease)    Glaucoma of both eyes    Gout    Hearing loss of both ears    Bilateral ears- hearing aids used- right ear hearing betther than left.   Hiatal hernia    History of blood transfusion 1957   lots; most related to my periods-last 1968 -s/p Hysterectomy   Migraines    years ago (02/04/2013)   Motion sickness    back seat - cars, sudden movements   PAF (paroxysmal atrial fibrillation) (HCC)    Pancreatic cyst    Pelvic floor dysfunction  PONV (postoperative nausea and vomiting)    Risk for falls    wobbley per daughter   Self-catheterizes urinary bladder    daily- retained  urine and chronic UTI   SVT (supraventricular tachycardia) (HCC)    Dr. KANDICE. Taylor-follows loop recorder left chest   Swallowing difficulty    freq occ. mostley liquids   Urine incontinence    UTI (urinary tract infection)    Chronic Urinary tract infections- tx Macrobid  daily.   Vertigo    Wears hearing aid in both ears     PAST SURGICAL HISTORY: Past Surgical History:  Procedure Laterality Date   APPENDECTOMY  01/03/1946   BLADDER SURGERY  01/03/1993   Repair-    BREAST BIOPSY Bilateral    both were fine   CATARACT EXTRACTION W/ INTRAOCULAR LENS IMPLANT Left    CATARACT EXTRACTION W/PHACO Right 10/16/2018   Procedure: CATARACT EXTRACTION PHACO AND INTRAOCULAR LENS PLACEMENT (IOC)  RIGHT MiLoop diabetes 01:05.8  21.0%  13.77;  Surgeon: Jaye Fallow, MD;  Location: [facility];  Service: Ophthalmology;  Laterality: Right;  BS tends to drop in AM and would like early surgery so she can get home to eat, please   CHOLECYSTECTOMY  01/04/2012   COMBINED HYSTERECTOMY ABDOMINAL W/ A&P REPAIR / OOPHORECTOMY     DILATION AND CURETTAGE OF UTERUS     EP IMPLANTABLE DEVICE N/A 06/19/2014   Procedure: Loop Recorder Insertion;  Surgeon: Danelle LELON Birmingham, MD;  Location: [facility];  Service: Cardiovascular;  Laterality: N/A;   EXCISION OF BREAST BIOPSY Right 05/13/2015   Procedure: RIGHT BREAST EXCISIONAL BIOPSY x2;  Surgeon: Krystal Spinner, MD;  Location: [facility];  Service: General;  Laterality: Right;   PACEMAKER IMPLANT  02/2020   TONSILLECTOMY  01/04/1944   TOTAL ABDOMINAL HYSTERECTOMY     VAGINAL HYSTERECTOMY  01/03/1966   vaginal mesh      FAMILY HISTORY: Family History  Problem Relation Age of Onset   Stroke Father    CVA Father    Ovarian cancer Mother    Cancer - Other Sister        Breast   Breast cancer Sister    Cancer - Other Brother        Throat   Cancer - Other Sister        Throat   Brain cancer Sister    Cancer - Other Other        Brain    Breast cancer Other     SOCIAL HISTORY: Social History   Socioeconomic History   Marital status: Widowed    Spouse name: Not on file   Number of children: 4   Years of education: 12   Highest education level: Not on file  Occupational History   Occupation: Air cabin crew  Tobacco Use   Smoking status: Never    Passive exposure: Past   Smokeless tobacco: Never  Vaping Use   Vaping status: Never Used  Substance and Sexual Activity   Alcohol use: No    Alcohol/week: 0.0 standard drinks of alcohol   Drug use: No   Sexual activity: Never  Other Topics Concern   Not on file  Social History Narrative   Lives in her home an family stays with her.    Retired Diplomatic Services operational officer.     Has 4 daughters.    Right handed   Caffeine-none   Social Drivers of Health   Financial Resource Strain: Low Risk  (02/12/2021)   Received from [facility]   Overall Financial Resource Strain (CARDIA)    Difficulty of Paying Living Expenses: Not hard at all  Food Insecurity: No Food Insecurity (03/03/2022)   Hunger Vital Sign    Worried About Running Out of Food in the Last Year: Never true    Ran Out of Food in the Last Year: Never true  Transportation Needs: No Transportation Needs (03/03/2022)   PRAPARE - Administrator, Civil Service (Medical): No    Lack of Transportation (Non-Medical): No  Physical Activity: Inactive (10/26/2017)   Exercise Vital Sign    Days of Exercise per Week: 0 days    Minutes of Exercise per Session: 0 min  Stress: No Stress Concern Present (10/26/2017)   [employer] of Occupational Health - Occupational Stress Questionnaire    Feeling of Stress : Not at all  Social Connections: Moderately Integrated (10/26/2017)   Social Connection and Isolation Panel    Frequency of Communication with Friends and Family: More than three times a week    Frequency of Social Gatherings with Friends and Family: More than three times a week    Attends  Religious Services: 1 to 4 times per year    Active Member of [employer] or Organizations: Yes    Attends Banker Meetings: 1 to 4 times per year    Marital Status: Widowed  Intimate Partner Violence: Not At Risk (02/24/2022)   Humiliation, Afraid, Rape, and Kick questionnaire    Fear of Current or Ex-Partner: No    Emotionally Abused: No    Physically Abused: No    Sexually Abused: No    PHYSICAL EXAM  GENERAL EXAM/CONSTITUTIONAL: Vitals:  Vitals:   06/27/23 1329  BP: 139/63  Pulse: 92  Resp: 15  SpO2: 96%    There is no height or weight on file to calculate BMI. Wt Readings from Last 3 Encounters:  05/17/23 118 lb (53.5 kg)  01/18/23 127 lb 6.4 oz (57.8 kg)  06/15/22 119 lb (54 kg)   Patient is in no distress; well developed, nourished and groomed; neck is supple  MUSCULOSKELETAL: Gait, strength, tone, movements noted in Neurologic exam below  NEUROLOGIC: MENTAL STATUS:     10/26/2017    3:27 PM  MMSE - Mini Mental State Exam  Not completed: Refused      06/15/2022    2:01 PM 12/09/2021    1:38 PM 06/07/2021    3:31 PM  Montreal Cognitive Assessment   Visuospatial/ Executive (0/5) 1 2 2   Naming (0/3) 2 2 2   Attention: Read list of digits (0/2) 2 2 2   Attention: Read list of letters (0/1) 1 1 0  Attention: Serial 7 subtraction starting at 100 (0/3) 1 3 0  Language: Repeat phrase (0/2) 0 2 2  Language : Fluency (0/1) 0 0 0  Abstraction (0/2) 1 1 2   Delayed Recall (0/5) 0 0 0  Orientation (0/6) 5 6 6   Total 13 19 16   Adjusted Score (based on education)  20   Able to spell PENCIL forward and backward  Very flat and blunted affect  CRANIAL NERVE:  2nd, 3rd, 4th, 6th - visual fields full to confrontation, extraocular muscles intact, no nystagmus 5th - facial sensation symmetric 7th - facial strength symmetric 8th - hearing intact 9th - palate elevates symmetrically, uvula midline 11th - shoulder shrug symmetric 12th - tongue protrusion  midline  MOTOR:  Decrease bulk, at least anti gravity   SENSORY:  normal and symmetric  to light touch  COORDINATION:  finger-nose-finger, fine finger movements normal  GAIT/STATION:  In a wheelchair    DIAGNOSTIC DATA (LABS, IMAGING, TESTING) - I reviewed patient records, labs, notes, testing and imaging myself where available.  Lab Results  Component Value Date   WBC 8.8 06/06/2022   HGB 11.3 (L) 06/06/2022   HCT 36.1 06/06/2022   MCV 91.3 06/06/2022   PLT 395.0 06/06/2022      Component Value Date/Time   NA 137 06/06/2022 1441   NA 142 02/23/2018 1525   K 4.1 06/06/2022 1441   CL 103 06/06/2022 1441   CO2 24 06/06/2022 1441   GLUCOSE 102 (H) 06/06/2022 1441   BUN 39 (H) 06/06/2022 1441   BUN 17 02/23/2018 1525   CREATININE 0.85 06/06/2022 1441   CALCIUM 9.4 06/06/2022 1441   PROT 8.2 06/06/2022 1441   ALBUMIN 3.9 06/06/2022 1441   AST 14 06/06/2022 1441   ALT 11 06/06/2022 1441   ALKPHOS 98 06/06/2022 1441   BILITOT 0.3 06/06/2022 1441   GFRNONAA 58 (L) 03/23/2022 1800   GFRAA 70 02/23/2018 1525   Lab Results  Component Value Date   CHOL 168 04/04/2016   HDL 58.10 04/04/2016   LDLCALC 89 04/04/2016   TRIG 105.0 04/04/2016   CHOLHDL 3 04/04/2016   Lab Results  Component Value Date   HGBA1C 5.5 05/07/2013   Lab Results  Component Value Date   VITAMINB12 1,154 (H) 02/25/2022   Lab Results  Component Value Date   TSH 4.81 03/30/2021     ASSESSMENT AND PLAN  89 y.o. year old female with multiple medical conditions including atrial fibrillation, multiple UTIs, history of pelvic floor dysfunction requiring self-catheterization, poor oral Intake who is presenting for follow up for her poor oral intake, weight loss, memory problem and headaches.  In term of the poor oral intake  and weight loss, daughter has reported improvement since starting the Dronabinol . Currently she is not getting Dronabinol  but her weight is stable. She eats small meals but  frequent meal and use Boost as a supplement.   Her headaches have improved on gabapentin , will continue Gabapentin  300 mg twice daily  For her memory deficit, patient likely has MCI vs. Mild Dementia, likely Alzheimer.  Daughter does not want to move forward with additional testing.  She will continue to follow up with PCP and return as needed.    1. Tension-type headache, not intractable, unspecified chronicity pattern   2. Mild late onset Alzheimer's dementia without behavioral disturbance, psychotic disturbance, mood disturbance, or anxiety (HCC)     Patient Instructions  Continue current medications  Continue to follow up with PCP  Return as needed  No orders of the defined types were placed in this encounter.   Meds ordered this encounter  Medications   gabapentin  (NEURONTIN ) 300 MG capsule    Sig: Take 1 capsule (300 mg total) by mouth 2 (two) times daily.    Dispense:  180 capsule    Refill:  3    Return if symptoms worsen or fail to improve.    Pastor Falling, MD 06/27/2023, 2:22 PM  [facility] [address] [place], [place] [address] [phone]

## 2023-06-27 NOTE — Patient Instructions (Addendum)
 Continue current medications  Continue to follow up with PCP  Return as needed

## 2023-06-28 ENCOUNTER — Encounter: Payer: Self-pay | Admitting: Oncology

## 2023-07-12 ENCOUNTER — Other Ambulatory Visit: Payer: Self-pay | Admitting: Internal Medicine

## 2023-07-17 ENCOUNTER — Encounter: Payer: Self-pay | Admitting: Internal Medicine

## 2023-07-17 DIAGNOSIS — H906 Mixed conductive and sensorineural hearing loss, bilateral: Secondary | ICD-10-CM | POA: Diagnosis not present

## 2023-07-23 ENCOUNTER — Other Ambulatory Visit: Payer: Self-pay | Admitting: Internal Medicine

## 2023-07-23 DIAGNOSIS — N390 Urinary tract infection, site not specified: Secondary | ICD-10-CM

## 2023-07-23 DIAGNOSIS — J479 Bronchiectasis, uncomplicated: Secondary | ICD-10-CM

## 2023-07-23 DIAGNOSIS — L89151 Pressure ulcer of sacral region, stage 1: Secondary | ICD-10-CM

## 2023-07-23 MED ORDER — CEFDINIR 250 MG/5ML PO SUSR: 250.0000 mg | Freq: Every day | ORAL | 1 refills | Status: AC

## 2023-08-08 ENCOUNTER — Other Ambulatory Visit: Payer: Self-pay | Admitting: Internal Medicine

## 2023-08-10 NOTE — Telephone Encounter (Signed)
 Last OV 05/17/23 Next OV not scheduled  Last refill 07/1622 Qty # 750 mL / 3

## 2023-08-18 DIAGNOSIS — R0609 Other forms of dyspnea: Secondary | ICD-10-CM | POA: Diagnosis not present

## 2023-08-18 DIAGNOSIS — Z79899 Other long term (current) drug therapy: Secondary | ICD-10-CM | POA: Diagnosis not present

## 2023-08-18 DIAGNOSIS — Z45018 Encounter for adjustment and management of other part of cardiac pacemaker: Secondary | ICD-10-CM | POA: Diagnosis not present

## 2023-08-18 DIAGNOSIS — I480 Paroxysmal atrial fibrillation: Secondary | ICD-10-CM | POA: Diagnosis not present

## 2023-08-18 DIAGNOSIS — I495 Sick sinus syndrome: Secondary | ICD-10-CM | POA: Diagnosis not present

## 2023-08-18 DIAGNOSIS — Z7901 Long term (current) use of anticoagulants: Secondary | ICD-10-CM | POA: Diagnosis not present

## 2023-08-18 DIAGNOSIS — R0789 Other chest pain: Secondary | ICD-10-CM | POA: Diagnosis not present

## 2023-08-18 NOTE — Progress Notes (Signed)
 Primary Care Provider: Joshua Debby CROME, MD [address] [place] [address]   Patient Identification: Julie Silva is a 89 y.o. female here for follow-up of her pacemaker and PAF  History of Present Illness Julie Silva is a 89 year old female with atrial fibrillation and a pacemaker who presents for a routine follow-up.  She feels 'pretty good' and has no palpitations, lightheadedness, dizziness, chest pain, or breathing difficulties. She uses a walker for mobility and exercises by walking around the house, though she does not track the distance.  Her pacemaker is a leadless and it is pacing the ventricular chamber approximately 80% of the time. The device currently shows seven years of battery life remaining. She does not currently have a home monitor for her pacemaker, as she had one for a previous loop device but not for the current pacemaker.  She is on a low dose of Apixaban  (2.5 mg) for atrial fibrillation and reports no bleeding issues. She has been symptomatic with atrial fibrillation in the past, particularly when her heart rate exceeds 110 bpm.  She denies chest pain and chest pressure/discomfort  Problem List:  Specialty Problems       Cardiology Problems   PAF (paroxysmal atrial fibrillation) (CMS/HHS-HCC)   Overview Signed 04/18/2019  3:12 PM by Sherre Kubas, RN  A. Anticoagulated with Eliquis          SSS (sick sinus syndrome) (CMS/HHS-HCC)      Patient Active Problem List  Diagnosis  . PAF (paroxysmal atrial fibrillation) (CMS/HHS-HCC)  . DOE (dyspnea on exertion)  . Chest discomfort  . Diastolic dysfunction  . Leadless Cardiac pacemaker  . Chronic anticoagulation  . On amiodarone therapy  . Encounter for care of pacemaker  . Glaucoma  . Macular degeneration  . Recurrent UTI  . Colovaginal fistula  . SSS (sick sinus syndrome) (CMS/HHS-HCC)  . Fitting and adjustment of cardiac pacemaker    Medications: Current Outpatient Medications  Medication Sig  Dispense Refill  . acetaminophen  (TYLENOL ) 500 MG tablet Take 1,000 mg by mouth every 6 (six) hours as needed for Pain    . apixaban  (ELIQUIS ) 2.5 mg tablet TAKE 1 TABLET BY MOUTH TWICE A DAY 60 tablet 1  . bimatoprost (LUMIGAN) 0.01 % ophthalmic solution Place into both eyes once daily    . cefdinir  (OMNICEF ) 250 mg/5 mL suspension Take 14 mg/kg/day by mouth once daily    . cyclobenzaprine  (FLEXERIL ) 5 MG tablet Take 5 mg by mouth 3 (three) times daily as needed (kidney stone flare up)    . dorzolamide -timolol  (COSOPT ) 22.3-6.8 mg/mL ophthalmic solution INSTILL ONE DROP TO BOTH EYES TWICE A DAY  3  . droNABinoL  (MARINOL ) 5 MG capsule TAKE 1 CAPSULE BY MOUTH TWICE A DAY BEFORE MEALS FOR APPETITE    . estradioL (ESTRACE) 0.01 % (0.1 mg/gram) vaginal cream Place 1 g vaginally every other day    . gabapentin  (NEURONTIN ) 100 MG capsule 200 mg in the morning 400 mg at night    . loperamide (IMODIUM) 2 mg capsule Take 2 mg by mouth 4 (four) times daily as needed for Diarrhea    . metoprolol tartrate (LOPRESSOR) 25 MG tablet Take 1 tablet (25 mg total) by mouth as directed 1/2 tab as needed for AF.  Do not exceed 50 mg in 6 hr or 100 mg per day 60 tablet 11  . omeprazole  (PRILOSEC) 40 MG DR capsule Take 20 mg by mouth once daily    . Saccharomyces boulardii (FLORASTOR) 250  mg capsule Take 250 mg by mouth once daily     No current facility-administered medications for this visit.    An updated, reconciled list of all medications, including OTC and herbal, was reviewed and a copy of the updated list was provided to the patient. Treatment plan and medications were discussed with the patient who voiced understanding. No barriers to learning.  Allergies: Allergies as of 08/18/2023 - Reviewed 08/18/2023  Allergen Reaction Noted  . Codeine Other (See Comments), Unknown, and Nausea 02/02/2013  . Colchicine  Diarrhea 07/30/2020   Answers submitted by the patient for this visit: Recent Medical Symptoms  (Submitted on 08/18/2023) Sore throat: No Hoarseness: No Shortness of breath: Yes Chest pain: No Tachycardia (heart racing): No leg pain: No Nausea: No Vomiting: No Diarrhea: No Constipation: No Abdominal pain: No Bowel habits change: No Melena (Black tar-like stool): No Dysuria (Pain with urination): No Frequency (The need to urinate many times a day): No Hesitancy (Difficulty in beginning the flow of urine): No Joint pain: No Joint swelling: No Myalgias (Muscle pain): No Rash: No Pigment changes/discoloration: No Memory loss: Yes Syncope (Fainting or passing out): No Extremity weakness: No Paresthesias (Pins and Needles): No Loss of balance: No    Physical Exam:  Vitals: There were no vitals taken for this visit.  BMI: There is no height or weight on file to calculate BMI.  General Appearance:  Appears well, no distress; Alert and oriented x 3  Neck: Carotids 2+ bilaterally, no carotid bruits, JVD is 6cm  Lungs:   Basilar dry rales, no wheezing  Heart:  Regular,  nml s1 s2  Abdomen:   Soft, non-tender, bowel sounds active,  no masses, no organomegaly  Extremities: Extremities normal, no cyanosis. no edema Bilateral   Pulses: Dorsalis pedis 2+ and symmetric bilaterally  Skin: No rashes or ulcers  Neurologic: Alert, interactive, and appropriate, grossly moving all 4 extremities    Procedure: Please see separate procedure note for details of the device interrogation and programming.  Assessment & Plan Cardiac pacemaker management in patient with sick sinus syndrome and paroxysmal atrial fibrillation   The leadless pacemaker is functioning well, effectively sensing atrial activity and pacing the ventricular chamber 80% of the time. It has 7 years of battery life remaining, with satisfactory lead amplitude, thresholds, and impedance. Minimal ventricular ectopy is present. The current AV delay is set at 30 milliseconds. Send a new monitor for home use to transmit  pacemaker data. Adjust AV delay to 100 milliseconds to allow more intrinsic conduction. Schedule follow-up in 9 months unless symptoms arise, warranting an earlier appointment.  Anticoagulation management for atrial fibrillation   She is on a low dose of Apixaban  (2.5 mg) with no significant bleeding issues. Continue Apixaban  2.5 mg twice daily due to age and weight.   Follow-up: remote q 3 months and f/u in 6 mo  I confirm that I was present for the key and critical portions of the service, including a review of the patient's history and laboratory data. I personally examined the patient, and formulated the evaluation and/or treatment plan.  I spent a total of 40 minutes in both face-to-face and non-face-to-face activities for this visit on the date of this encounter.    This note has been created using automated tools and reviewed for accuracy by CAMILLE GENISE FRAZIER-MILLS.

## 2023-08-31 ENCOUNTER — Encounter (HOSPITAL_COMMUNITY): Payer: Self-pay | Admitting: Emergency Medicine

## 2023-08-31 ENCOUNTER — Encounter: Payer: Self-pay | Admitting: Internal Medicine

## 2023-08-31 ENCOUNTER — Inpatient Hospital Stay (HOSPITAL_COMMUNITY)
Admission: EM | Admit: 2023-08-31 | Discharge: 2023-09-04 | DRG: 392 | Disposition: A | Discharge status: Home / Self Care | Attending: Emergency Medicine | Admitting: Emergency Medicine

## 2023-08-31 ENCOUNTER — Emergency Department (HOSPITAL_COMMUNITY)

## 2023-08-31 ENCOUNTER — Other Ambulatory Visit: Payer: Self-pay

## 2023-08-31 ENCOUNTER — Telehealth: Payer: Self-pay | Admitting: Nurse Practitioner

## 2023-08-31 DIAGNOSIS — Z888 Allergy status to other drugs, medicaments and biological substances status: Secondary | ICD-10-CM

## 2023-08-31 DIAGNOSIS — Z91048 Other nonmedicinal substance allergy status: Secondary | ICD-10-CM

## 2023-08-31 DIAGNOSIS — Z66 Do not resuscitate: Secondary | ICD-10-CM | POA: Diagnosis present

## 2023-08-31 DIAGNOSIS — R630 Anorexia: Secondary | ICD-10-CM | POA: Diagnosis present

## 2023-08-31 DIAGNOSIS — Z803 Family history of malignant neoplasm of breast: Secondary | ICD-10-CM | POA: Diagnosis not present

## 2023-08-31 DIAGNOSIS — I700 Atherosclerosis of aorta: Secondary | ICD-10-CM | POA: Diagnosis present

## 2023-08-31 DIAGNOSIS — Z7901 Long term (current) use of anticoagulants: Secondary | ICD-10-CM

## 2023-08-31 DIAGNOSIS — K219 Gastro-esophageal reflux disease without esophagitis: Secondary | ICD-10-CM | POA: Diagnosis present

## 2023-08-31 DIAGNOSIS — Z6820 Body mass index (BMI) 20.0-20.9, adult: Secondary | ICD-10-CM

## 2023-08-31 DIAGNOSIS — K5792 Diverticulitis of intestine, part unspecified, without perforation or abscess without bleeding: Secondary | ICD-10-CM | POA: Diagnosis not present

## 2023-08-31 DIAGNOSIS — Z85828 Personal history of other malignant neoplasm of skin: Secondary | ICD-10-CM

## 2023-08-31 DIAGNOSIS — N1831 Chronic kidney disease, stage 3a: Secondary | ICD-10-CM | POA: Diagnosis present

## 2023-08-31 DIAGNOSIS — K449 Diaphragmatic hernia without obstruction or gangrene: Secondary | ICD-10-CM | POA: Diagnosis not present

## 2023-08-31 DIAGNOSIS — Z832 Family history of diseases of the blood and blood-forming organs and certain disorders involving the immune mechanism: Secondary | ICD-10-CM | POA: Diagnosis not present

## 2023-08-31 DIAGNOSIS — H9193 Unspecified hearing loss, bilateral: Secondary | ICD-10-CM | POA: Diagnosis present

## 2023-08-31 DIAGNOSIS — Y762 Prosthetic and other implants, materials and accessory obstetric and gynecological devices associated with adverse incidents: Secondary | ICD-10-CM | POA: Diagnosis present

## 2023-08-31 DIAGNOSIS — R1032 Left lower quadrant pain: Secondary | ICD-10-CM | POA: Diagnosis present

## 2023-08-31 DIAGNOSIS — I480 Paroxysmal atrial fibrillation: Secondary | ICD-10-CM | POA: Diagnosis present

## 2023-08-31 DIAGNOSIS — N390 Urinary tract infection, site not specified: Secondary | ICD-10-CM | POA: Diagnosis present

## 2023-08-31 DIAGNOSIS — T83711A Erosion of implanted vaginal mesh and other prosthetic materials to surrounding organ or tissue, initial encounter: Secondary | ICD-10-CM | POA: Diagnosis present

## 2023-08-31 DIAGNOSIS — B952 Enterococcus as the cause of diseases classified elsewhere: Secondary | ICD-10-CM | POA: Diagnosis present

## 2023-08-31 DIAGNOSIS — K8050 Calculus of bile duct without cholangitis or cholecystitis without obstruction: Secondary | ICD-10-CM | POA: Diagnosis present

## 2023-08-31 DIAGNOSIS — I495 Sick sinus syndrome: Secondary | ICD-10-CM | POA: Diagnosis present

## 2023-08-31 DIAGNOSIS — K862 Cyst of pancreas: Secondary | ICD-10-CM | POA: Diagnosis present

## 2023-08-31 DIAGNOSIS — Z974 Presence of external hearing-aid: Secondary | ICD-10-CM

## 2023-08-31 DIAGNOSIS — I2510 Atherosclerotic heart disease of native coronary artery without angina pectoris: Secondary | ICD-10-CM | POA: Diagnosis present

## 2023-08-31 DIAGNOSIS — K5732 Diverticulitis of large intestine without perforation or abscess without bleeding: Principal | ICD-10-CM | POA: Diagnosis present

## 2023-08-31 DIAGNOSIS — R1310 Dysphagia, unspecified: Secondary | ICD-10-CM | POA: Diagnosis present

## 2023-08-31 DIAGNOSIS — Z8041 Family history of malignant neoplasm of ovary: Secondary | ICD-10-CM

## 2023-08-31 DIAGNOSIS — R54 Age-related physical debility: Secondary | ICD-10-CM | POA: Diagnosis present

## 2023-08-31 DIAGNOSIS — E1122 Type 2 diabetes mellitus with diabetic chronic kidney disease: Secondary | ICD-10-CM | POA: Diagnosis present

## 2023-08-31 DIAGNOSIS — Z723 Lack of physical exercise: Secondary | ICD-10-CM | POA: Diagnosis not present

## 2023-08-31 DIAGNOSIS — Z950 Presence of cardiac pacemaker: Secondary | ICD-10-CM

## 2023-08-31 DIAGNOSIS — Z79899 Other long term (current) drug therapy: Secondary | ICD-10-CM

## 2023-08-31 DIAGNOSIS — M810 Age-related osteoporosis without current pathological fracture: Secondary | ICD-10-CM | POA: Diagnosis present

## 2023-08-31 DIAGNOSIS — D649 Anemia, unspecified: Secondary | ICD-10-CM | POA: Diagnosis present

## 2023-08-31 DIAGNOSIS — Z885 Allergy status to narcotic agent status: Secondary | ICD-10-CM

## 2023-08-31 DIAGNOSIS — J479 Bronchiectasis, uncomplicated: Secondary | ICD-10-CM | POA: Diagnosis not present

## 2023-08-31 DIAGNOSIS — R531 Weakness: Secondary | ICD-10-CM | POA: Diagnosis present

## 2023-08-31 DIAGNOSIS — Z808 Family history of malignant neoplasm of other organs or systems: Secondary | ICD-10-CM

## 2023-08-31 DIAGNOSIS — Z823 Family history of stroke: Secondary | ICD-10-CM

## 2023-08-31 DIAGNOSIS — Z90710 Acquired absence of both cervix and uterus: Secondary | ICD-10-CM

## 2023-08-31 DIAGNOSIS — M1120 Other chondrocalcinosis, unspecified site: Secondary | ICD-10-CM | POA: Diagnosis present

## 2023-08-31 LAB — URINALYSIS, ROUTINE W REFLEX MICROSCOPIC
Bilirubin Urine: NEGATIVE
Glucose, UA: NEGATIVE mg/dL
Ketones, ur: NEGATIVE mg/dL
Nitrite: POSITIVE — AB
Protein, ur: 30 mg/dL — AB
Specific Gravity, Urine: 1.024 (ref 1.005–1.030)
WBC, UA: >50 WBC/hpf (ref 0–5)
pH: 5 (ref 5.0–8.0)

## 2023-08-31 LAB — CBC
HCT: 31.8 % — ABNORMAL LOW (ref 36.0–46.0)
Hemoglobin: 9.7 g/dL — ABNORMAL LOW (ref 12.0–15.0)
MCH: 29.1 pg (ref 26.0–34.0)
MCHC: 30.5 g/dL (ref 30.0–36.0)
MCV: 95.5 fL (ref 80.0–100.0)
Platelets: 334 K/uL (ref 150–400)
RBC: 3.33 MIL/uL — ABNORMAL LOW (ref 3.87–5.11)
RDW: 15.1 % (ref 11.5–15.5)
WBC: 10.4 K/uL (ref 4.0–10.5)
nRBC: 0 % (ref 0.0–0.2)

## 2023-08-31 LAB — COMPREHENSIVE METABOLIC PANEL WITH GFR
ALT: 7 U/L (ref 0–44)
AST: 20 U/L (ref 15–41)
Albumin: 3.6 g/dL (ref 3.5–5.0)
Alkaline Phosphatase: 112 U/L (ref 38–126)
Anion gap: 14 (ref 5–15)
BUN: 20 mg/dL (ref 8–23)
CO2: 22 mmol/L (ref 22–32)
Calcium: 9.1 mg/dL (ref 8.9–10.3)
Chloride: 103 mmol/L (ref 98–111)
Creatinine, Ser: 1.16 mg/dL — ABNORMAL HIGH (ref 0.44–1.00)
GFR, Estimated: 43 mL/min — ABNORMAL LOW (ref >60)
Glucose, Bld: 141 mg/dL — ABNORMAL HIGH (ref 70–99)
Potassium: 3.7 mmol/L (ref 3.5–5.1)
Sodium: 139 mmol/L (ref 135–145)
Total Bilirubin: 0.5 mg/dL (ref 0.0–1.2)
Total Protein: 7.5 g/dL (ref 6.5–8.1)

## 2023-08-31 LAB — LIPASE, BLOOD: Lipase: 36 U/L (ref 11–51)

## 2023-08-31 MED ORDER — LACTATED RINGERS IV SOLN: INTRAVENOUS | Status: AC

## 2023-08-31 MED ORDER — SODIUM CHLORIDE 3 % IN NEBU
4.0000 mL | INHALATION_SOLUTION | Freq: Two times a day (BID) | RESPIRATORY_TRACT | Status: AC
  Administered 2023-09-01 – 2023-09-03 (×5): 4 mL via RESPIRATORY_TRACT
  Filled 2023-08-31 (×6): qty 4

## 2023-08-31 MED ORDER — ALBUTEROL SULFATE (2.5 MG/3ML) 0.083% IN NEBU: 2.5000 mg | INHALATION_SOLUTION | RESPIRATORY_TRACT | Status: DC | PRN

## 2023-08-31 MED ORDER — ACETAMINOPHEN 325 MG PO TABS
650.0000 mg | ORAL_TABLET | Freq: Four times a day (QID) | ORAL | Status: DC | PRN
  Administered 2023-09-01 – 2023-09-03 (×4): 650 mg via ORAL
  Filled 2023-08-31 (×4): qty 2

## 2023-08-31 MED ORDER — DORZOLAMIDE HCL-TIMOLOL MAL 2-0.5 % OP SOLN
1.0000 [drp] | Freq: Two times a day (BID) | OPHTHALMIC | Status: DC
  Administered 2023-09-01 – 2023-09-04 (×7): 1 [drp] via OPHTHALMIC
  Filled 2023-08-31: qty 10

## 2023-08-31 MED ORDER — BOOST / RESOURCE BREEZE PO LIQD CUSTOM: 1.0000 | Freq: Two times a day (BID) | ORAL | Status: DC

## 2023-08-31 MED ORDER — PIPERACILLIN-TAZOBACTAM 3.375 G IVPB 30 MIN
3.3750 g | Freq: Once | INTRAVENOUS | Status: AC
  Administered 2023-08-31: 3.375 g via INTRAVENOUS
  Filled 2023-08-31: qty 50

## 2023-08-31 MED ORDER — SENNOSIDES-DOCUSATE SODIUM 8.6-50 MG PO TABS: 1.0000 | ORAL_TABLET | Freq: Every evening | ORAL | Status: DC | PRN

## 2023-08-31 MED ORDER — ONDANSETRON HCL 4 MG PO TABS
4.0000 mg | ORAL_TABLET | Freq: Four times a day (QID) | ORAL | Status: DC | PRN
Start: 2023-08-31 — End: 2023-09-04

## 2023-08-31 MED ORDER — SODIUM CHLORIDE 0.9 % IV BOLUS
500.0000 mL | Freq: Once | INTRAVENOUS | Status: AC
  Administered 2023-08-31: 500 mL via INTRAVENOUS

## 2023-08-31 MED ORDER — LATANOPROST 0.005 % OP SOLN
1.0000 [drp] | Freq: Every day | OPHTHALMIC | Status: DC
  Administered 2023-08-31 – 2023-09-03 (×4): 1 [drp] via OPHTHALMIC
  Filled 2023-08-31 (×2): qty 2.5

## 2023-08-31 MED ORDER — ONDANSETRON HCL 4 MG/2ML IJ SOLN: 4.0000 mg | Freq: Four times a day (QID) | INTRAMUSCULAR | Status: DC | PRN

## 2023-08-31 MED ORDER — APIXABAN 2.5 MG PO TABS
2.5000 mg | ORAL_TABLET | Freq: Two times a day (BID) | ORAL | Status: DC
  Administered 2023-08-31 – 2023-09-04 (×8): 2.5 mg via ORAL
  Filled 2023-08-31 (×8): qty 1

## 2023-08-31 MED ORDER — ACETAMINOPHEN 650 MG RE SUPP
650.0000 mg | Freq: Four times a day (QID) | RECTAL | Status: DC | PRN
Start: 2023-08-31 — End: 2023-09-03

## 2023-08-31 MED ORDER — GUAIFENESIN 100 MG/5ML PO LIQD
200.0000 mg | Freq: Three times a day (TID) | ORAL | Status: DC
  Administered 2023-08-31 – 2023-09-04 (×11): 200 mg via ORAL
  Filled 2023-08-31 (×5): qty 10
  Filled 2023-08-31: qty 20
  Filled 2023-08-31 (×5): qty 10

## 2023-08-31 MED ORDER — PIPERACILLIN-TAZOBACTAM 3.375 G IVPB
3.3750 g | Freq: Three times a day (TID) | INTRAVENOUS | Status: DC
  Administered 2023-08-31 – 2023-09-02 (×5): 3.375 g via INTRAVENOUS
  Filled 2023-08-31 (×5): qty 50

## 2023-08-31 MED ORDER — IOHEXOL 300 MG/ML  SOLN
80.0000 mL | Freq: Once | INTRAMUSCULAR | Status: AC | PRN
  Administered 2023-08-31: 80 mL via INTRAVENOUS

## 2023-08-31 MED ORDER — GABAPENTIN 300 MG PO CAPS
300.0000 mg | ORAL_CAPSULE | Freq: Two times a day (BID) | ORAL | Status: DC
  Administered 2023-08-31 – 2023-09-04 (×8): 300 mg via ORAL
  Filled 2023-08-31 (×8): qty 1

## 2023-08-31 NOTE — H&P (Signed)
 History and Physical    Julie Silva FMW:[id number] DOB: Dec 25, 1927 DOA: 08/31/2023  PCP: Joshua Debby CROME, MD   Patient coming from: Home  Chief Complaint  Patient presents with   Abdominal Pain     HPI: Julie Silva is a 89 y.o. year old female with PMH of history of diverticulitis admission on 2/24, PAF on anticoagulation, bronchiectasis, pseudogout, CAD, recurrent UTI on chronic cefdinir  for prophylaxis, history of vaginal mesh erosion with fistula, pulmonary MAC infection presents with abdominal pain for about a week on the left lower quadrant worsening since last night and with associated nausea.  Patient has been having low-grade temperature feeling feverish x 2 days and decreased energy weakness patient denied any vomiting, diarrhea shortness of breath or chest pain.  She has cloudy urine at baseline.  Zosyn  was given along with 500 cc normal saline and admission requested. She has baseline shortness of breath with activity. Has poor appetite, was on hospice in the past when her weight was around 80 lb now in 113lb and graduated from hospice and on boost ~ 2 a day and 10% fat yogurt daily. Patient otherwise denies any vomiting, chest pain, headache, focal weakness, numbness tingling, speech difficulties   In the ED: Afebrile blood pressure 130/61 saturating well on room air, labs reviewed fairly stable CMP creatinine 1.1 CBC with hemoglobin 9.7 UA grossly abnormal WBC more than 50 RBC 11-20, L LE large nitrate positive urine culture ordered CT abdomen pelvis with contrast> worsening of right lung consolidation and nodular patchy opacities with right lower lobe cavitation 1.4 cm-infectious versus malignancy etiology advising CT chest with IV contrast.  Other finding colonic diverticulosis with distal sigmoid diverticulitis, gas and fluid collection 1.5 X1.6X 10.5 cm extending from the vaginal plexi into the setting of hysterectomy up to the presacral space at the S1.  Aortic  atherosclerosis  Assessment and plan: Principal Problem:   Sigmoid diverticulitis  Sigmoid diverticulitis: Had admission for diverticulitis back in February.  Having llq abd pain, wbc normal, has low grade temp. Con on Zosyn , ivf, pain control, antiemetics. Fu blood culture.  Known to Dr. Albertus from GI and will consult them in am.   Pulmonary MAC infection Bronchiectasis: Ct shows worsening of right lung consolidation and nodular patchy opacities with right lower lobe cavitation 1.4 cm.  She is followed by Dr. Theophilus and managing with guaifenesin  and 3% NS nebs bid and antibiotics when she is symptomatics. No cough or shortess of breath now will ask Pulm to eval tomorrow.  history of vaginal mesh erosion with fistula: Redemonstrated of stable gas and fluid collection in the CT scan.     PAF: Cont on anticoagulation  Chronic anemia: Hb stable.  Pseudogout: Stable  CAD: No chest pain  Recurrent UTI on chronic cefdinir : UA grossly abnormal on Zosyn  as #1,hold home cefdinir .  Fu culture. She does in and out cath 4-5 times a day will continue.  DVT prophylaxis: apixaban  (ELIQUIS ) tablet 2.5 mg Start: 08/31/23 2200 Code Status:   Code Status: Limited: Do not attempt resuscitation (DNR) -DNR-LIMITED -Do Not Intubate/DNI  as per her daughter  [name] who is POA  Dispo: The patient is from: home by herself with caregiver/ daughters            Anticipated disposition: TBD Objective: Vitals last 24 hrs: Vitals:   08/31/23 1330 08/31/23 1534 08/31/23 1743 08/31/23 1841  BP: (!) 120/52 137/64  (!) 169/81  Pulse: 76 75  84  Resp: 18 16  20  Temp:   98.4 F (36.9 C)   TempSrc:   Oral   SpO2: 97% 99%  100%  Weight:      Height:        Physical Examination: General exam: alert awake, oriented,  hard of hearing HEENT:Oral mucosa moist, Ear/Nose WNL grossly Respiratory system: Bilaterally clear BS,no use of accessory muscle Cardiovascular system: S1 & S2 +, No  JVD. Gastrointestinal system: Abdomen soft, tender on lower abdomen Nervous System: Alert, awake, moving all extremities,and following commands. Extremities: LE edema neg, distal extremities warm.  Skin: No rashes,no icterus. MSK: Normal muscle bulk,tone, power   Medications reviewed:  Scheduled Meds:  apixaban   2.5 mg Oral BID   dorzolamide -timolol   1 drop Both Eyes BID   [START ON 09/01/2023] feeding supplement  1 Container Oral BID BM   gabapentin   300 mg Oral BID   guaifenesin   200 mg Oral TID   latanoprost   1 drop Both Eyes QHS   sodium chloride  HYPERTONIC  4 mL Nebulization BID   Continuous Infusions:  lactated ringers       Severity of Illness: The appropriate patient status for this patient is INPATIENT. Inpatient status is judged to be reasonable and necessary in order to provide the required intensity of service to ensure the patient's safety. The patient's presenting symptoms, physical exam findings, and initial radiographic and laboratory data in the context of their chronic comorbidities is felt to place them at high risk for further clinical deterioration. Furthermore, it is not anticipated that the patient will be medically stable for discharge from the hospital within 2 midnights of admission.   * I certify that at the point of admission it is my clinical judgment that the patient will require inpatient hospital care spanning beyond 2 midnights from the point of admission due to high intensity of service, high risk for further deterioration and high frequency of surveillance required.*  Family Communication: Admission, patients condition and plan of care including tests being ordered have been discussed with the patient and daughter who indicate understanding and agree with the plan and Code Status.  Consults called:  nonw  Review of Systems: All systems were reviewed and were negative except as mentioned in HPI above. Negative for focal weakness Negative for chest  pain Negative for constipation  Past Medical History:  Diagnosis Date   Anemia    Aortic atherosclerosis (HCC)    Arthritis    Osteoporosis, neck stiffness   Atrial fibrillation (HCC)    Bleeds easily [facility])    father was a hemophiliac - pt not being bothered in recent years.   CAD (coronary artery disease)    Calcium deposit in bursa of knee 2017   Cancer (HCC)    skin cancer basal cell.   Chicken pox    Cyst, Baker's knee    left knee- tx. Cortisone injection 05-05-15 in office- improved pain relief and swelling left ankle and knee   Diverticulitis    Diverticulosis    Dysrhythmia    Dr. McAlhany-cardiology   GERD (gastroesophageal reflux disease)    Glaucoma of both eyes    Gout    Hearing loss of both ears    Bilateral ears- hearing aids used- right ear hearing betther than left.   Hiatal hernia    History of blood transfusion 1957   lots; most related to my periods-last 1968 -s/p Hysterectomy   Migraines    years ago (02/04/2013)   Motion sickness    back seat -  cars, sudden movements   PAF (paroxysmal atrial fibrillation) (HCC)    Pancreatic cyst    Pelvic floor dysfunction    PONV (postoperative nausea and vomiting)    Risk for falls    wobbley per daughter   Self-catheterizes urinary bladder    daily- retained urine and chronic UTI   SVT (supraventricular tachycardia) (HCC)    Dr. KANDICE. Taylor-follows loop recorder left chest   Swallowing difficulty    freq occ. mostley liquids   Urine incontinence    UTI (urinary tract infection)    Chronic Urinary tract infections- tx Macrobid  daily.   Vertigo    Wears hearing aid in both ears     Past Surgical History:  Procedure Laterality Date   APPENDECTOMY  01/03/1946   BLADDER SURGERY  01/03/1993   Repair-    BREAST BIOPSY Bilateral    both were fine   CATARACT EXTRACTION W/ INTRAOCULAR LENS IMPLANT Left    CATARACT EXTRACTION W/PHACO Right 10/16/2018   Procedure: CATARACT EXTRACTION PHACO AND  INTRAOCULAR LENS PLACEMENT (IOC)  RIGHT MiLoop diabetes 01:05.8  21.0%  13.77;  Surgeon: Jaye Fallow, MD;  [facility]: [facility];  Service: Ophthalmology;  Laterality: Right;  BS tends to drop in AM and would like early surgery so she can get home to eat, please   CHOLECYSTECTOMY  01/04/2012   COMBINED HYSTERECTOMY ABDOMINAL W/ A&P REPAIR / OOPHORECTOMY     DILATION AND CURETTAGE OF UTERUS     EP IMPLANTABLE DEVICE N/A 06/19/2014   Procedure: Loop Recorder Insertion;  Surgeon: Danelle LELON Birmingham, MD;  Location: [facility];  Service: Cardiovascular;  Laterality: N/A;   EXCISION OF BREAST BIOPSY Right 05/13/2015   Procedure: RIGHT BREAST EXCISIONAL BIOPSY x2;  Surgeon: Krystal Spinner, MD;  Location: [facility];  Service: General;  Laterality: Right;   PACEMAKER IMPLANT  02/2020   TONSILLECTOMY  01/04/1944   TOTAL ABDOMINAL HYSTERECTOMY     VAGINAL HYSTERECTOMY  01/03/1966   vaginal mesh       reports that she has never smoked. She has been exposed to tobacco smoke. She has never used smokeless tobacco. She reports that she does not drink alcohol and does not use drugs.  Allergies  Allergen Reactions   Codeine Other (See Comments)    Passed out   Colchicine  Diarrhea   Family History  Problem Relation Age of Onset   Stroke Father    CVA Father    Ovarian cancer Mother    Cancer - Other Sister        Breast   Breast cancer Sister    Cancer - Other Brother        Throat   Cancer - Other Sister        Throat   Brain cancer Sister    Cancer - Other Other        Brain   Breast cancer Other    Prior to Admission medications   Medication Sig Start Date End Date Taking? Authorizing Provider  acetaminophen  (TYLENOL ) 500 MG tablet Take 500 mg by mouth 2 (two) times daily.    [provider]  apixaban  (ELIQUIS ) 2.5 MG TABS tablet Take 1 tablet (2.5 mg total) by mouth 2 (two) times daily. 05/17/23   Purcell Emil Schanz, MD  bimatoprost (LUMIGAN) 0.01 % SOLN Place 1  drop into both eyes at bedtime.    [provider]  Catheters MISC Replace catheter twice daily. 07/12/17   Joshua Debby CROME, MD  cefdinir  (OMNICEF ) 250 MG/5ML suspension Take 5 mLs (250 mg total) by mouth daily. 07/23/23   Joshua Debby CROME, MD  dorzolamide -timolol  (COSOPT ) 22.3-6.8 MG/ML ophthalmic solution Place 1 drop into both eyes 2 (two) times daily.    [provider]  estradiol (ESTRACE) 0.1 MG/GM vaginal cream Place 1 Applicatorful vaginally every other day.    [provider]  gabapentin  (NEURONTIN ) 300 MG capsule Take 1 capsule (300 mg total) by mouth 2 (two) times daily. 06/27/23 06/21/24  Camara, Amadou, MD  guaifenesin  (ROBITUSSIN) 100 MG/5ML syrup Take 200 mg by mouth 3 (three) times daily.    [provider]  Multiple Vitamins-Minerals (PRESERVISION AREDS 2) CAPS Take 1 tablet by mouth 2 (two) times daily.    [provider]  omeprazole  (PRILOSEC) 20 MG capsule TAKE 1 CAPSULE BY MOUTH EVERY DAY Patient taking differently: Take 20 mg by mouth daily. TAKE 1 CAPSULE BY MOUTH EVERY DAY 10/26/21   Joshua Debby CROME, MD  ondansetron  (ZOFRAN -ODT) 4 MG disintegrating tablet Take 1 tablet (4 mg total) by mouth every 8 (eight) hours as needed for nausea or vomiting. 02/28/22   Amin, Ankit C, MD  prochlorperazine  (COMPAZINE ) 10 MG tablet Take 2.5 mg by mouth every 4 (four) hours as needed for nausea or vomiting. 01/19/22   [provider]  sodium chloride  HYPERTONIC 3 % nebulizer solution INHALE 1 VIAL VIA NEBULIZER ONCE A DAY 08/10/23   Joshua Debby CROME, MD  traMADol  (ULTRAM ) 50 MG tablet Take 50 mg by mouth as needed for moderate pain (pain score 4-6). For kidney stones    [provider]  Wheat Dextrin (BENEFIBER PO) Take 5 mLs by mouth 2 (two) times daily.    [provider]   Labs on Admission: I have personally reviewed following labs and imaging studies  CBC: Recent Labs  Lab 08/31/23 1211  WBC 10.4  HGB 9.7*  HCT 31.8*   MCV 95.5  PLT 334   Basic Metabolic Panel: Recent Labs  Lab 08/31/23 1211  NA 139  K 3.7  CL 103  CO2 22  GLUCOSE 141*  BUN 20  CREATININE 1.16*  CALCIUM 9.1   Estimated Creatinine Clearance: 25.5 mL/min (A) (by C-G formula based on SCr of 1.16 mg/dL (H)). Recent Labs  Lab 08/31/23 1211  AST 20  ALT 7  ALKPHOS 112  BILITOT 0.5  PROT 7.5  ALBUMIN 3.6   Recent Labs  Lab 08/31/23 1211  LIPASE 36  Thyroid  Function Tests: No results for input(s): TSH, T4TOTAL, FREET4, T3FREE, THYROIDAB in the last 72 hours. Urine analysis:    Component Value Date/Time   COLORURINE YELLOW 08/31/2023 1315   APPEARANCEUR CLOUDY (A) 08/31/2023 1315   LABSPEC 1.024 08/31/2023 1315   PHURINE 5.0 08/31/2023 1315   GLUCOSEU NEGATIVE 08/31/2023 1315   GLUCOSEU NEGATIVE 07/12/2021 1700   HGBUR MODERATE (A) 08/31/2023 1315   BILIRUBINUR NEGATIVE 08/31/2023 1315   BILIRUBINUR negative 02/26/2021 1132   KETONESUR NEGATIVE 08/31/2023 1315   PROTEINUR 30 (A) 08/31/2023 1315   UROBILINOGEN 0.2 07/12/2021 1700   NITRITE POSITIVE (A) 08/31/2023 1315   LEUKOCYTESUR LARGE (A) 08/31/2023 1315    Radiological Exams on Admission: CT ABDOMEN PELVIS W CONTRAST Result Date: 08/31/2023 CLINICAL DATA:  LLQ abdominal pain abd pain x 1 week. abdominal pain x 1 week. Patient report worsening LLQ abdominal pain last night and this morning accompanied with nausea, denies vomiting EXAM: CT ABDOMEN AND PELVIS WITH CONTRAST TECHNIQUE: Multidetector CT imaging of the abdomen and  pelvis was performed using the standard protocol following bolus administration of intravenous contrast. RADIATION DOSE REDUCTION: This exam was performed according to the departmental dose-optimization program which includes automated exposure control, adjustment of the mA and/or kV according to patient size and/or use of iterative reconstruction technique. CONTRAST:  80mL OMNIPAQUE  IOHEXOL  300 MG/ML  SOLN COMPARISON:  CT abdomen  pelvis 06/07/2022, CT abdomen pelvis 03/24/2022, CT abdomen pelvis 02/23/2022 FINDINGS: Lower chest: Interval worsening of right lung consolidations and nodular patchy airspace opacities with associated interval development of a couple of right lower lobe cavitation measuring at least 1.4 cm. Small hiatal hernia. Hepatobiliary: No focal liver abnormality. Status post cholecystectomy. No biliary dilatation. Pancreas: Diffusely atrophic. No focal lesion. Otherwise normal pancreatic contour. No surrounding inflammatory changes. No main pancreatic ductal dilatation. Spleen: Multiple calcifications within the spleen. No focal lesion. The spleen is normal in size. Adrenals/Urinary Tract: No adrenal nodule bilaterally. Bilateral kidneys enhance symmetrically. Fluid density lesions of the kidneys likely represent simple renal cysts. Simple renal cysts, in the absence of clinically indicated signs/symptoms, require no independent follow-up. No hydronephrosis. No hydroureter. The urinary bladder is unremarkable. On delayed imaging, there is no urothelial wall thickening and there are no filling defects in the opacified portions of the bilateral collecting systems or ureters. Stomach/Bowel: Stomach is within normal limits. No evidence of small bowel wall thickening or dilatation. Mild bowel wall thickening and pericolonic fat stranding along the focal diverticula of the distal sigmoid colon (2:66). Colonic diverticulosis. Appendix appears normal Vascular/Lymphatic: No abdominal aorta or iliac aneurysm. Moderate atherosclerotic plaque of the aorta and its branches. No abdominal, pelvic, or inguinal lymphadenopathy. Reproductive: Status post hysterectomy. Other: Redemonstration of stable gas and fluid collection measuring 1.5 x 1.6 x 10.5 cm extending from the vaginal pexy in the setting of a hysterectomy up to the presacral space at the S1 level. No free gas or fluid. Musculoskeletal: No abdominal wall hernia or abnormality. No  suspicious lytic or blastic osseous lesions. No acute displaced fracture. IMPRESSION: 1. Interval worsening of right lung consolidation and nodular patchy airspace opacities with associated interval development of a couple of right lower lobe cavitations measuring at least 1.4 cm. Finding may represent infection versus malignancy. Recommend CT chest with intravenous contrast for further evaluation. 2. Colonic diverticulosis with uncomplicated distal sigmoid diverticulitis. Recommend colonoscopy status post treatment and status post complete resolution of inflammatory changes to exclude an underlying lesion. 3. Redemonstration of stable gas and fluid collection measuring 1.5 x 1.6 x 10.5 cm extending from the vaginal pexy in the setting of a hysterectomy up to the presacral space at the S1 level. 4. Hiatal hernia. 5.  Aortic Atherosclerosis (ICD10-I70.0). Electronically Signed   By: Morgane  Naveau M.D.   On: 08/31/2023 17:26   Mennie LAMY MD [facility]  If 7PM-7AM, please contact night-coverage [email or web address]  08/31/2023, 7:36 PM

## 2023-08-31 NOTE — Telephone Encounter (Signed)
 I agree with that recommendation for evaluation as opposed to empiric abx given her hx of complicated diverticulitis JMP

## 2023-08-31 NOTE — ED Triage Notes (Signed)
 Patient c/o abdominal pain x 1 week. Patient report worsening LLQ abdominal pain last night and this morning accompanied with nausea, denies vomiting and diarrhea. Patient denies chest pain and SOB.

## 2023-08-31 NOTE — ED Provider Notes (Signed)
  EMERGENCY DEPARTMENT AT [facility] Provider Note   CSN: [id number] Arrival date & time: 08/31/23  1154     Patient presents with: Abdominal Pain   Julie Silva is a 89 y.o. female.   Patient with past medical history of complicated diverticulitis (last admit 02/2022), paroxysmal atrial fibrillation on chronic anticoagulation, bronchiectasis, pseudogout, CAD, recurrent UTI on chronic cefdinir  for prophylaxis at bedtime, history of vaginal mesh erosion with fistula, pulmonary MAC infection --presents to the emergency department for approximately 1 week of progressive lower abdominal pain, worse on the left.  Patient has had some mild tenderness starting about a week ago.  She has progressed to having low fevers over the past 2 days.  2 days ago 99.6 F and last night 100.9 F.  She has had decreased energy as well.  She has had nausea without vomiting.  No chest pain or shortness of breath.  No cough or trouble breathing.  She had a bowel movement today without blood.  Pain is worse with movement and transferring.  Daughter does note that urine is typically very cloudy and urine is at baseline today.  She reports that she is not typically treated unless she has typical UTI symptoms occluding dysuria, increased frequency or urgency.  With her current symptoms, she has not had these typical UTI symptoms.  Her recurrent UTIs were much improved after initiation of prophylactic antibiotics.       Prior to Admission medications   Medication Sig Start Date End Date Taking? Authorizing Provider  acetaminophen  (TYLENOL ) 500 MG tablet Take 500 mg by mouth 2 (two) times daily.    [provider]  apixaban  (ELIQUIS ) 2.5 MG TABS tablet Take 1 tablet (2.5 mg total) by mouth 2 (two) times daily. 05/17/23   Purcell Emil Schanz, MD  bimatoprost (LUMIGAN) 0.01 % SOLN Place 1 drop into both eyes at bedtime.    [provider]  Catheters MISC Replace catheter twice  daily. 07/12/17   Sarahy Creedon Debby CROME, MD  cefdinir  (OMNICEF ) 250 MG/5ML suspension Take 5 mLs (250 mg total) by mouth daily. 07/23/23   Rayah Fines Debby CROME, MD  dorzolamide -timolol  (COSOPT ) 22.3-6.8 MG/ML ophthalmic solution Place 1 drop into both eyes 2 (two) times daily.    [provider]  estradiol (ESTRACE) 0.1 MG/GM vaginal cream Place 1 Applicatorful vaginally every other day.    [provider]  gabapentin  (NEURONTIN ) 300 MG capsule Take 1 capsule (300 mg total) by mouth 2 (two) times daily. 06/27/23 06/21/24  Camara, Amadou, MD  guaifenesin  (ROBITUSSIN) 100 MG/5ML syrup Take 200 mg by mouth 3 (three) times daily.    [provider]  Multiple Vitamins-Minerals (PRESERVISION AREDS 2) CAPS Take 1 tablet by mouth 2 (two) times daily.    [provider]  omeprazole  (PRILOSEC) 20 MG capsule TAKE 1 CAPSULE BY MOUTH EVERY DAY Patient taking differently: Take 20 mg by mouth daily. TAKE 1 CAPSULE BY MOUTH EVERY DAY 10/26/21   Jama Krichbaum Debby CROME, MD  ondansetron  (ZOFRAN -ODT) 4 MG disintegrating tablet Take 1 tablet (4 mg total) by mouth every 8 (eight) hours as needed for nausea or vomiting. 02/28/22   Amin, Ankit C, MD  prochlorperazine  (COMPAZINE ) 10 MG tablet Take 2.5 mg by mouth every 4 (four) hours as needed for nausea or vomiting. 01/19/22   [provider]  sodium chloride  HYPERTONIC 3 % nebulizer solution INHALE 1 VIAL VIA NEBULIZER ONCE A DAY 08/10/23   Exie Chrismer Debby CROME, MD  traMADol  (ULTRAM ) 50  MG tablet Take 50 mg by mouth as needed for moderate pain (pain score 4-6). For kidney stones    [provider]  Wheat Dextrin (BENEFIBER PO) Take 5 mLs by mouth 2 (two) times daily.    [provider]    Allergies: Codeine and Colchicine     Review of Systems  Updated Vital Signs BP 130/61 (BP Location: Right Arm)   Pulse 83   Temp 98.4 F (36.9 C) (Oral)   Resp 18   Ht 5' 4 (1.626 m)   Wt 54.4 kg   SpO2 100%   BMI 20.60 kg/m   Physical  Exam Vitals and nursing note reviewed.  Constitutional:      General: She is not in acute distress.    Appearance: She is well-developed.     Comments: Hard of hearing  HENT:     Head: Normocephalic and atraumatic.     Right Ear: External ear normal.     Left Ear: External ear normal.     Nose: Nose normal.  Eyes:     Conjunctiva/sclera: Conjunctivae normal.  Cardiovascular:     Rate and Rhythm: Normal rate and regular rhythm.     Heart sounds: No murmur heard. Pulmonary:     Effort: No respiratory distress.     Breath sounds: No wheezing, rhonchi or rales.  Abdominal:     Palpations: Abdomen is soft.     Tenderness: There is generalized abdominal tenderness and tenderness in the right lower quadrant, suprapubic area and left lower quadrant. There is guarding. There is no rebound. Negative signs include Murphy's sign, Rovsing's sign and McBurney's sign.  Musculoskeletal:     Cervical back: Normal range of motion and neck supple.     Right lower leg: No edema.     Left lower leg: No edema.  Skin:    General: Skin is warm and dry.     Findings: No rash.  Neurological:     General: No focal deficit present.     Mental Status: She is alert. Mental status is at baseline.     Motor: No weakness.  Psychiatric:        Mood and Affect: Mood normal.     (all labs ordered are listed, but only abnormal results are displayed) Labs Reviewed  COMPREHENSIVE METABOLIC PANEL WITH GFR - Abnormal; Notable for the following components:      Result Value   Glucose, Bld 141 (*)    Creatinine, Ser 1.16 (*)    GFR, Estimated 43 (*)    All other components within normal limits  CBC - Abnormal; Notable for the following components:   RBC 3.33 (*)    Hemoglobin 9.7 (*)    HCT 31.8 (*)    All other components within normal limits  URINALYSIS, ROUTINE W REFLEX MICROSCOPIC - Abnormal; Notable for the following components:   APPearance CLOUDY (*)    Hgb urine dipstick MODERATE (*)    Protein,  ur 30 (*)    Nitrite POSITIVE (*)    Leukocytes,Ua LARGE (*)    Bacteria, UA MANY (*)    All other components within normal limits  URINE CULTURE  LIPASE, BLOOD    EKG: None  Radiology: CT ABDOMEN PELVIS W CONTRAST Result Date: 08/31/2023 CLINICAL DATA:  LLQ abdominal pain abd pain x 1 week. abdominal pain x 1 week. Patient report worsening LLQ abdominal pain last night and this morning accompanied with nausea, denies vomiting EXAM: CT ABDOMEN AND PELVIS WITH CONTRAST  TECHNIQUE: Multidetector CT imaging of the abdomen and pelvis was performed using the standard protocol following bolus administration of intravenous contrast. RADIATION DOSE REDUCTION: This exam was performed according to the departmental dose-optimization program which includes automated exposure control, adjustment of the mA and/or kV according to patient size and/or use of iterative reconstruction technique. CONTRAST:  80mL OMNIPAQUE  IOHEXOL  300 MG/ML  SOLN COMPARISON:  CT abdomen pelvis 06/07/2022, CT abdomen pelvis 03/24/2022, CT abdomen pelvis 02/23/2022 FINDINGS: Lower chest: Interval worsening of right lung consolidations and nodular patchy airspace opacities with associated interval development of a couple of right lower lobe cavitation measuring at least 1.4 cm. Small hiatal hernia. Hepatobiliary: No focal liver abnormality. Status post cholecystectomy. No biliary dilatation. Pancreas: Diffusely atrophic. No focal lesion. Otherwise normal pancreatic contour. No surrounding inflammatory changes. No main pancreatic ductal dilatation. Spleen: Multiple calcifications within the spleen. No focal lesion. The spleen is normal in size. Adrenals/Urinary Tract: No adrenal nodule bilaterally. Bilateral kidneys enhance symmetrically. Fluid density lesions of the kidneys likely represent simple renal cysts. Simple renal cysts, in the absence of clinically indicated signs/symptoms, require no independent follow-up. No hydronephrosis. No  hydroureter. The urinary bladder is unremarkable. On delayed imaging, there is no urothelial wall thickening and there are no filling defects in the opacified portions of the bilateral collecting systems or ureters. Stomach/Bowel: Stomach is within normal limits. No evidence of small bowel wall thickening or dilatation. Mild bowel wall thickening and pericolonic fat stranding along the focal diverticula of the distal sigmoid colon (2:66). Colonic diverticulosis. Appendix appears normal Vascular/Lymphatic: No abdominal aorta or iliac aneurysm. Moderate atherosclerotic plaque of the aorta and its branches. No abdominal, pelvic, or inguinal lymphadenopathy. Reproductive: Status post hysterectomy. Other: Redemonstration of stable gas and fluid collection measuring 1.5 x 1.6 x 10.5 cm extending from the vaginal pexy in the setting of a hysterectomy up to the presacral space at the S1 level. No free gas or fluid. Musculoskeletal: No abdominal wall hernia or abnormality. No suspicious lytic or blastic osseous lesions. No acute displaced fracture. IMPRESSION: 1. Interval worsening of right lung consolidation and nodular patchy airspace opacities with associated interval development of a couple of right lower lobe cavitations measuring at least 1.4 cm. Finding may represent infection versus malignancy. Recommend CT chest with intravenous contrast for further evaluation. 2. Colonic diverticulosis with uncomplicated distal sigmoid diverticulitis. Recommend colonoscopy status post treatment and status post complete resolution of inflammatory changes to exclude an underlying lesion. 3. Redemonstration of stable gas and fluid collection measuring 1.5 x 1.6 x 10.5 cm extending from the vaginal pexy in the setting of a hysterectomy up to the presacral space at the S1 level. 4. Hiatal hernia. 5.  Aortic Atherosclerosis (ICD10-I70.0). Electronically Signed   By: Morgane  Naveau M.D.   On: 08/31/2023 17:26     Procedures    Medications Ordered in the ED - No data to display  ED Course  Patient seen and examined. History obtained directly from patient and daughter, reviewed previous admission notes from 02/2022.   Labs/EKG: Ordered CBC with white blood cell count high normal 10.4, hemoglobin slightly lower than baseline at 9.7; CMP creatinine minimally elevated at 1.16 within normal BUN, glucose 141, normal electrolytes; lipase normal.  UA pending, bedside urine appears cloudy.  Imaging: Ordered CT abdomen pelvis.  Medications/Fluids: None ordered  Most recent vital signs reviewed and are as follows: BP 130/61 (BP Location: Right Arm)   Pulse 83   Temp 98.4 F (36.9 C) (Oral)   Resp  18   Ht 5' 4 (1.626 m)   Wt 54.4 kg   SpO2 100%   BMI 20.60 kg/m   Initial impression: Abdominal pain, progressive, fever reported to 100.9 F last night.  6:18 PM Reassessment performed. Patient appears stable, sitting up at bedside.  Labs personally reviewed and interpreted including: UA with signs of infection in setting of chronic colonization and prophylactic cephalosporin.  Imaging personally visualized and interpreted including: Agree, interval worsening right lower lobe infection, uncomplicated sigmoid diverticulitis, pelvic fluid collection.  Reviewed pertinent lab work and imaging with patient at bedside and daughter at bedside.  Recommended admission.  Also discussed possibility of discharge with oral antibiotics which I would not recommend.  Questions answered.  Patient agreeable to admission.  Most current vital signs reviewed and are as follows: BP 137/64   Pulse 75   Temp 98.4 F (36.9 C) (Oral)   Resp 16   Ht 5' 4 (1.626 m)   Wt 54.4 kg   SpO2 99%   BMI 20.60 kg/m   Plan: Will start on Zosyn .  Plan for admission for IV antibiotics for treatment of diverticulitis in high risk patient.  7:20 PM Spoke with Dr. CHRISTOBAL who will see for admit.                                    Medical Decision  Making Amount and/or Complexity of Data Reviewed Labs: ordered. Radiology: ordered.  Risk Prescription drug management. Decision regarding hospitalization.   Patient with diverticulitis, multiple comorbidities, fever at home yesterday.  Admit for IV antibiotics for stabilization.  No indication for surgical consultation at this time.     Final diagnoses:  Diverticulitis    ED Discharge Orders     None          Desiderio Chew, DEVONNA 08/31/23 1921    Towana Ozell BROCKS, MD 09/01/23 [phone]

## 2023-08-31 NOTE — Telephone Encounter (Signed)
 Patient's daughter, Julie Silva states that patient began having abdominal tenderness within the last week with low grade fever around 460F but was still having bowel movements daily, so thought maybe she was just a little backed up. Yesterday morning, Julie Silva states that her mother experienced severe sharp abdominal pain, worse with standing/walking but not as painful with sitting still. States he temp last night was 100.60F and patient's pain has persisted with standing/walking.   Patient with history of diverticulitis and microperforation 2024.  I have advised Julie Silva that due to patient's history of diverticulitis with microperforation, the fact that she is having severe abdominal pain and fever as well as her advanced age, she should take patient to the emergency room for expedited evaluation where they may choose to do STAT CT/labs etc.  [name] verbalizes understanding.

## 2023-08-31 NOTE — Hospital Course (Addendum)
 Julie Silva is a 89 y.o. year old female with PMH of history of diverticulitis admission on 2/24, PAF on anticoagulation, bronchiectasis, pseudogout, CAD, recurrent UTI on chronic cefdinir  for prophylaxis, history of vaginal mesh erosion with fistula, pulmonary MAC infection presents with abdominal pain for about a week on the left lower quadrant worsening since last night and with associated nausea.  Patient has been having low-grade temperature feeling feverish x 2 days and decreased energy weakness patient denied any vomiting, diarrhea shortness of breath or chest pain.  She has cloudy urine at baseline.  Zosyn  was given along with 500 cc normal saline and admission requested. She has baseline shortness of breath with activity. Has poor appetite, was on hospice in the past when her weight was around 80 lb now in 113lb and graduated from hospice and on boost ~ 2 a day and 10% fat yogurt daily. Patient otherwise denies any vomiting, chest pain, headache, focal weakness, numbness tingling, speech difficulties   In the ED: Afebrile blood pressure 130/61 saturating well on room air, labs reviewed fairly stable CMP creatinine 1.1 CBC with hemoglobin 9.7 UA grossly abnormal WBC more than 50 RBC 11-20, L LE large nitrate positive urine culture ordered CT abdomen pelvis with contrast> worsening of right lung consolidation and nodular patchy opacities with right lower lobe cavitation 1.4 cm-infectious versus malignancy etiology advising CT chest with IV contrast.  Other finding colonic diverticulosis with distal sigmoid diverticulitis, gas and fluid collection 1.5 X1.6X 10.5 cm extending from the vaginal plexi into the setting of hysterectomy up to the presacral space at the S1.  Aortic atherosclerosis  Assessment and plan: Principal Problem:   Sigmoid diverticulitis  Sigmoid diverticulitis: Had admission for diverticulitis back in February.  Having llq abd pain, wbc normal, has low grade temp. Con on Zosyn ,  ivf, pain control, antiemetics. Fu blood culture.  Known to Julie Silva from GI and will consult them in am.   Pulmonary MAC infection Bronchiectasis: Ct shows worsening of right lung consolidation and nodular patchy opacities with right lower lobe cavitation 1.4 cm.  She is followed by Julie Silva and managing with guaifenesin  and 3% NS nebs bid and antibiotics when she is symptomatics. No cough or shortess of breath now will ask Pulm to eval tomorrow.  history of vaginal mesh erosion with fistula: Redemonstrated of stable gas and fluid collection in the CT scan.     PAF: Cont on anticoagulation  Chronic anemia: Hb stable.  Pseudogout: Stable  CAD: No chest pain  Recurrent UTI on chronic cefdinir : UA grossly abnormal on Zosyn  as #1,hold home cefdinir .  Fu culture. She does in and out cath 4-5 times a day will continue.  DVT prophylaxis: apixaban  (ELIQUIS ) tablet 2.5 mg Start: 08/31/23 2200 Code Status:   Code Status: Limited: Do not attempt resuscitation (DNR) -DNR-LIMITED -Do Not Intubate/DNI  as per her daughter  [name] who is POA  Dispo: The patient is from: home by herself with caregiver/ daughters            Anticipated disposition: TBD Objective: Vitals last 24 hrs: Vitals:   08/31/23 1330 08/31/23 1534 08/31/23 1743 08/31/23 1841  BP: (!) 120/52 137/64  (!) 169/81  Pulse: 76 75  84  Resp: 18 16  20   Temp:   98.4 F (36.9 C)   TempSrc:   Oral   SpO2: 97% 99%  100%  Weight:      Height:        Physical Examination: General exam:  alert awake, oriented,  hard of hearing HEENT:Oral mucosa moist, Ear/Nose WNL grossly Respiratory system: Bilaterally clear BS,no use of accessory muscle Cardiovascular system: S1 & S2 +, No JVD. Gastrointestinal system: Abdomen soft, tender on lower abdomen Nervous System: Alert, awake, moving all extremities,and following commands. Extremities: LE edema neg, distal extremities warm.  Skin: No rashes,no icterus. MSK: Normal muscle  bulk,tone, power   Medications reviewed:  Scheduled Meds:  apixaban   2.5 mg Oral BID   dorzolamide -timolol   1 drop Both Eyes BID   [START ON 09/01/2023] feeding supplement  1 Container Oral BID BM   gabapentin   300 mg Oral BID   guaifenesin   200 mg Oral TID   latanoprost   1 drop Both Eyes QHS   sodium chloride  HYPERTONIC  4 mL Nebulization BID   Continuous Infusions:  lactated ringers 

## 2023-08-31 NOTE — Telephone Encounter (Signed)
 Inbound call from patient stating that she is having a diverticulitis flare up and her oral tempeture was 109.9. Patient is requesting a call back.Please advise.

## 2023-09-01 DIAGNOSIS — R1032 Left lower quadrant pain: Secondary | ICD-10-CM | POA: Diagnosis present

## 2023-09-01 DIAGNOSIS — K5732 Diverticulitis of large intestine without perforation or abscess without bleeding: Secondary | ICD-10-CM | POA: Diagnosis not present

## 2023-09-01 DIAGNOSIS — J479 Bronchiectasis, uncomplicated: Secondary | ICD-10-CM | POA: Diagnosis not present

## 2023-09-01 LAB — COMPREHENSIVE METABOLIC PANEL WITH GFR
ALT: 5 U/L (ref 0–44)
AST: 18 U/L (ref 15–41)
Albumin: 3.3 g/dL — ABNORMAL LOW (ref 3.5–5.0)
Alkaline Phosphatase: 101 U/L (ref 38–126)
Anion gap: 15 (ref 5–15)
BUN: 13 mg/dL (ref 8–23)
CO2: 19 mmol/L — ABNORMAL LOW (ref 22–32)
Calcium: 8.8 mg/dL — ABNORMAL LOW (ref 8.9–10.3)
Chloride: 105 mmol/L (ref 98–111)
Creatinine, Ser: 0.87 mg/dL (ref 0.44–1.00)
GFR, Estimated: >60 mL/min (ref >60)
Glucose, Bld: 94 mg/dL (ref 70–99)
Potassium: 3.7 mmol/L (ref 3.5–5.1)
Sodium: 139 mmol/L (ref 135–145)
Total Bilirubin: 0.5 mg/dL (ref 0.0–1.2)
Total Protein: 7.2 g/dL (ref 6.5–8.1)

## 2023-09-01 LAB — CBC
HCT: 33.3 % — ABNORMAL LOW (ref 36.0–46.0)
Hemoglobin: 9.5 g/dL — ABNORMAL LOW (ref 12.0–15.0)
MCH: 27.5 pg (ref 26.0–34.0)
MCHC: 28.5 g/dL — ABNORMAL LOW (ref 30.0–36.0)
MCV: 96.2 fL (ref 80.0–100.0)
Platelets: 346 K/uL (ref 150–400)
RBC: 3.46 MIL/uL — ABNORMAL LOW (ref 3.87–5.11)
RDW: 15.1 % (ref 11.5–15.5)
WBC: 6.8 K/uL (ref 4.0–10.5)
nRBC: 0 % (ref 0.0–0.2)

## 2023-09-01 MED ORDER — ENSURE PLUS HIGH PROTEIN PO LIQD
237.0000 mL | Freq: Two times a day (BID) | ORAL | Status: DC
  Administered 2023-09-01 (×2): 237 mL via ORAL

## 2023-09-01 MED ORDER — SODIUM CHLORIDE 3 % IN NEBU: 4.0000 mL | INHALATION_SOLUTION | Freq: Two times a day (BID) | RESPIRATORY_TRACT | Status: DC

## 2023-09-01 MED ORDER — PANTOPRAZOLE SODIUM 40 MG PO TBEC
40.0000 mg | DELAYED_RELEASE_TABLET | Freq: Every day | ORAL | Status: DC
  Administered 2023-09-01 – 2023-09-04 (×4): 40 mg via ORAL
  Filled 2023-09-01 (×4): qty 1

## 2023-09-01 NOTE — Progress Notes (Signed)
 Family member at bedside refused to have vitals taken and RN notified

## 2023-09-01 NOTE — Consult Note (Addendum)
 Referring Provider: Dr. Ivonne Mustache Primary Care Physician:  Joshua Debby CROME, MD Primary Gastroenterologist:  Dr. Albertus  Reason for Consultation: Diverticulitis  HPI: Julie Silva is a 89 y.o. female  with a past medical history of atrial fibrillation on Eliquis , sick sinus syndrome s/p pacemaker placement, bronchiectasis with underlying MAI, CKD stage IIIA, pseudogout, recurrent UTIs s/p bladder repair requires daily in and out cath since the 1990s (on Ceftin daily for UTI prophylaxis), pancreatic cysts, choledocholithiasis status post ERCP 04/2009, small hiatal hernia, GERD, intermittent overflow diarrhea and diverticulitis with a microperforation 02/2022. Past hysterectomy and vaginal pexy.  Her daughter contacted our outpatient GI clinic yesterday and reported mother had LLQ pain x 1 week with a low grade fever. Due to the patient's prior history of diverticulitis with microperforation, she was instructed to present to the ED 08/31/2023 for further evaluation. Labs in the ED showed a WBC count of 10.4.  Hemoglobin 9.7 down from 11.3 one year ago. Hematocrit 31. Platelets 334.  Sodium 139.  8.7.  BUN 20.  Creatinine 1.16.  GFR 43.  Bili 0.5.  Alk phos 112.  AST 20.  ALT 7.  Lipase 37.  CTAP showed uncomplicated distal sigmoid diverticulitis with stable gas and collection measuring 6 x 10.5 cm extending from the vaginal pexy (in setting of past hysterectomy) up to the presacral space. Started on Zosyn  IV every 8 hours. A GI consult was requested for further management of uncomplicated diverticulitis.  Patient is significantly hard of hearing therefore her daughter provides the above history and symptom update. She developed LLQ tenderness last week which progressively worsened over the past few days. She noted feeling really bad on Sunday 8/24. On Wednesday 8/27 she awakened with severe lower abdominal pain, passed a moderate normal formed stool later that afternoon and had a temperature of  100.48F.  She took Tylenol  and went to bed around 6 PM. Awakened 8/28 with persistent pain therefore she presented to the ED as noted above. At home, her bowel pattern varies. She takes Benefiber 1 teaspoon daily and MiraLAX one half capful every 2 weeks. She typically passes a normal formed soft stool most days then stool output will decrease for few days and sometimes passes loose stools. No bloody or black stools.She has chronic pill dysphagia. No difficulty swallowing solid foods. No GERD symptoms.  Currently, her lower abdominal pain is well-controlled and recurs with change of position.  Prior history of diverticulitis with a microperforation. She was admitted to the hospital 02/23/2022 due to having N/V, diarrhea and LLQ pain. CTAP identified acute diverticulitis with microperforation. She was started on Zosyn  IV and she was evaluated by general surgery who recommended conservative management. She was placed on IV fluids and bowel rest. Her clinical status stabilized and she was discharged home 02/28/2022 on Augmentin  875 mg twice daily to complete a 2-week course and her abdominal pain abated.   She underwent a colonoscopy in her lifetime which was at least 20 years ago, her daughter believes the colonoscopy was normal.  No known family history of colorectal cancer.  Past Medical History:  Diagnosis Date   Anemia    Aortic atherosclerosis (HCC)    Arthritis    Osteoporosis, neck stiffness   Atrial fibrillation (HCC)    Bleeds easily [facility])    father was a hemophiliac - pt not being bothered in recent years.   CAD (coronary artery disease)    Calcium deposit in bursa of knee 2017   Cancer (HCC)  skin cancer basal cell.   Chicken pox    Cyst, Baker's knee    left knee- tx. Cortisone injection 05-05-15 in office- improved pain relief and swelling left ankle and knee   Diverticulitis    Diverticulosis    Dysrhythmia    Dr. McAlhany-cardiology   GERD (gastroesophageal reflux disease)     Glaucoma of both eyes    Gout    Hearing loss of both ears    Bilateral ears- hearing aids used- right ear hearing betther than left.   Hiatal hernia    History of blood transfusion 1957   lots; most related to my periods-last 1968 -s/p Hysterectomy   Migraines    years ago (02/04/2013)   Motion sickness    back seat - cars, sudden movements   PAF (paroxysmal atrial fibrillation) (HCC)    Pancreatic cyst    Pelvic floor dysfunction    PONV (postoperative nausea and vomiting)    Risk for falls    wobbley per daughter   Self-catheterizes urinary bladder    daily- retained urine and chronic UTI   SVT (supraventricular tachycardia) (HCC)    Dr. KANDICE. Taylor-follows loop recorder left chest   Swallowing difficulty    freq occ. mostley liquids   Urine incontinence    UTI (urinary tract infection)    Chronic Urinary tract infections- tx Macrobid  daily.   Vertigo    Wears hearing aid in both ears     Past Surgical History:  Procedure Laterality Date   APPENDECTOMY  01/03/1946   BLADDER SURGERY  01/03/1993   Repair-    BREAST BIOPSY Bilateral    both were fine   CATARACT EXTRACTION W/ INTRAOCULAR LENS IMPLANT Left    CATARACT EXTRACTION W/PHACO Right 10/16/2018   Procedure: CATARACT EXTRACTION PHACO AND INTRAOCULAR LENS PLACEMENT (IOC)  RIGHT MiLoop diabetes 01:05.8  21.0%  13.77;  Surgeon: Jaye Fallow, MD;  Location: [facility];  Service: Ophthalmology;  Laterality: Right;  BS tends to drop in AM and would like early surgery so she can get home to eat, please   CHOLECYSTECTOMY  01/04/2012   COMBINED HYSTERECTOMY ABDOMINAL W/ A&P REPAIR / OOPHORECTOMY     DILATION AND CURETTAGE OF UTERUS     EP IMPLANTABLE DEVICE N/A 06/19/2014   Procedure: Loop Recorder Insertion;  Surgeon: Danelle LELON Birmingham, MD;  Location: [facility];  Service: Cardiovascular;  Laterality: N/A;   EXCISION OF BREAST BIOPSY Right 05/13/2015   Procedure: RIGHT BREAST EXCISIONAL  BIOPSY x2;  Surgeon: Krystal Spinner, MD;  Location: [facility];  Service: General;  Laterality: Right;   PACEMAKER IMPLANT  02/2020   TONSILLECTOMY  01/04/1944   TOTAL ABDOMINAL HYSTERECTOMY     VAGINAL HYSTERECTOMY  01/03/1966   vaginal mesh      Prior to Admission medications   Medication Sig Start Date End Date Taking? Authorizing Provider  acetaminophen  (TYLENOL ) 500 MG tablet Take 1,000 mg by mouth See admin instructions. Take 1,000 mg by mouth at 6 AM and 8 PM- may take an additional 1,000 mg once a day as needed for pain   Yes [provider]  apixaban  (ELIQUIS ) 2.5 MG TABS tablet Take 1 tablet (2.5 mg total) by mouth 2 (two) times daily. 05/17/23  Yes Sagardia, Emil Schanz, MD  cefdinir  (OMNICEF ) 250 MG/5ML suspension Take 5 mLs (250 mg total) by mouth daily. Patient taking differently: Take 250 mg by mouth at bedtime. 07/23/23  Yes Joshua Debby CROME, MD  dorzolamide -timolol  (COSOPT ) 22.3-6.8  MG/ML ophthalmic solution Place 1 drop into both eyes 2 (two) times daily.   Yes [provider]  estradiol (ESTRACE) 0.1 MG/GM vaginal cream Place 0.5 g vaginally 3 (three) times a week.   Yes [provider]  gabapentin  (NEURONTIN ) 300 MG capsule Take 1 capsule (300 mg total) by mouth 2 (two) times daily. Patient taking differently: Take 300 mg by mouth See admin instructions. Mix 300 mg into applesauce and take by mouth at 6 AM and 8 PM 06/27/23 06/21/24 Yes Camara, Amadou, MD  guaifenesin  (ROBITUSSIN) 100 MG/5ML syrup Take 200 mg by mouth 3 (three) times daily.   Yes [provider]  latanoprost  (XALATAN ) 0.005 % ophthalmic solution Place 1 drop into both eyes at bedtime.   Yes [provider]  Multiple Vitamins-Minerals (PRESERVISION AREDS 2) CHEW Chew 1 tablet by mouth 2 (two) times daily.   Yes [provider]  OMEPRAZOLE  PO Take 20 mg by mouth See admin instructions. Dissolve 20 mg and place into applesauce- take by mouth at 6 AM   Yes [provider]  ondansetron  (ZOFRAN -ODT) 4 MG disintegrating tablet Take 1 tablet (4 mg total) by mouth every 8 (eight) hours as needed for nausea or vomiting. Patient taking differently: Take 2 mg by mouth every 8 (eight) hours as needed for nausea or vomiting. 02/28/22  Yes Amin, Ankit C, MD  prochlorperazine  (COMPAZINE ) 10 MG tablet Take 2.5 mg by mouth every 4 (four) hours as needed for nausea or vomiting. 01/19/22  Yes [provider]  sodium chloride  HYPERTONIC 3 % nebulizer solution INHALE 1 VIAL VIA NEBULIZER ONCE A DAY Patient taking differently: Take 4 mLs by nebulization in the morning and at bedtime. 08/10/23  Yes Joshua Debby CROME, MD  SYSTANE 0.4-0.3 % GEL ophthalmic gel Place 1 Application into both eyes 3 (three) times daily as needed (for irritation and dryness).   Yes [provider]  traMADol  (ULTRAM ) 50 MG tablet Take 25 mg by mouth every 6 (six) hours as needed (for kidney stone pain).   Yes [provider]  Wheat Dextrin (BENEFIBER PO) Take 4 g by mouth See admin instructions. Mix 4 grams (1 teaspoonful) into a glass of milk and drink by mouth 2 times a day   Yes [provider]  Catheters MISC Replace catheter twice daily. 07/12/17   Joshua Debby CROME, MD  omeprazole  (PRILOSEC) 20 MG capsule TAKE 1 CAPSULE BY MOUTH EVERY DAY Patient not taking: Reported on 08/31/2023 10/26/21   Joshua Debby CROME, MD    Current Facility-Administered Medications  Medication Dose Route Frequency Provider Last Rate Last Admin   acetaminophen  (TYLENOL ) tablet 650 mg  650 mg Oral Q6H PRN Kc, Mennie, MD       Or   acetaminophen  (TYLENOL ) suppository 650 mg  650 mg Rectal Q6H PRN Kc, Ramesh, MD       albuterol  (PROVENTIL ) (2.5 MG/3ML) 0.083% nebulizer solution 2.5 mg  2.5 mg Nebulization Q2H PRN Kc, Mennie, MD       apixaban  (ELIQUIS ) tablet 2.5 mg  2.5 mg Oral BID Kc, Ramesh, MD   2.5 mg at 08/31/23 2349   dorzolamide -timolol  (COSOPT ) 2-0.5 % ophthalmic solution 1 drop  1  drop Both Eyes BID Kc, Ramesh, MD       feeding supplement (ENSURE PLUS HIGH PROTEIN) liquid 237 mL  237 mL Oral BID BM Adhikari, Amrit, MD       gabapentin  (NEURONTIN ) capsule 300 mg  300 mg Oral BID Kc, Ramesh,  MD   300 mg at 08/31/23 2349   guaiFENesin  (ROBITUSSIN) 100 MG/5ML liquid 200 mg  200 mg Oral TID Christobal Guadalajara, MD   200 mg at 08/31/23 2350   lactated ringers  infusion   Intravenous Continuous Christobal Guadalajara, MD 75 mL/hr at 08/31/23 2101 New Bag at 08/31/23 2101   latanoprost  (XALATAN ) 0.005 % ophthalmic solution 1 drop  1 drop Both Eyes QHS Kc, Ramesh, MD   1 drop at 08/31/23 2355   ondansetron  (ZOFRAN ) tablet 4 mg  4 mg Oral Q6H PRN Christobal Guadalajara, MD       Or   ondansetron  (ZOFRAN ) injection 4 mg  4 mg Intravenous Q6H PRN Kc, Ramesh, MD       piperacillin -tazobactam (ZOSYN ) IVPB 3.375 g  3.375 g Intravenous Q8H BellRosaline DASEN, RPH 12.5 mL/hr at 08/31/23 2348 3.375 g at 08/31/23 2348   senna-docusate (Senokot-S) tablet 1 tablet  1 tablet Oral QHS PRN Christobal Guadalajara, MD       sodium chloride  HYPERTONIC 3 % nebulizer solution 4 mL  4 mL Nebulization BID Kc, Ramesh, MD        Allergies as of 08/31/2023 - Review Complete 08/31/2023  Allergen Reaction Noted   Codeine Other (See Comments) 02/02/2013   Colchicine  Diarrhea 07/30/2020   Tape Other (See Comments) 08/31/2023    Family History  Problem Relation Age of Onset   Stroke Father    CVA Father    Ovarian cancer Mother    Cancer - Other Sister        Breast   Breast cancer Sister    Cancer - Other Brother        Throat   Cancer - Other Sister        Throat   Brain cancer Sister    Cancer - Other Other        Brain   Breast cancer Other     Social History   Socioeconomic History   Marital status: Widowed    Spouse name: Not on file   Number of children: 4   Years of education: 12   Highest education level: Not on file  Occupational History   Occupation: Air cabin crew  Tobacco Use   Smoking status: Never     Passive exposure: Past   Smokeless tobacco: Never  Vaping Use   Vaping status: Never Used  Substance and Sexual Activity   Alcohol use: No    Alcohol/week: 0.0 standard drinks of alcohol   Drug use: No   Sexual activity: Never  Other Topics Concern   Not on file  Social History Narrative   Lives in her home an family stays with her.    Retired Diplomatic Services operational officer.     Has 4 daughters.    Right handed   Caffeine-none   Social Drivers of Health   Financial Resource Strain: Low Risk  (02/12/2021)   [facility]   Overall Financial Resource Strain (CARDIA)    Difficulty of Paying Living Expenses: Not hard at all  Food Insecurity: No Food Insecurity (08/31/2023)   Hunger Vital Sign    Worried About Running Out of Food in the Last Year: Never true    Ran Out of Food in the Last Year: Never true  Transportation Needs: No Transportation Needs (08/31/2023)   PRAPARE - Administrator, Civil Service (Medical): No    Lack of Transportation (Non-Medical): No  Physical Activity: Inactive (10/26/2017)   Exercise Vital Sign  Days of Exercise per Week: 0 days    Minutes of Exercise per Session: 0 min  Stress: No Stress Concern Present (10/26/2017)   [employer] of Occupational Health - Occupational Stress Questionnaire    Feeling of Stress : Not at all  Social Connections: Moderately Integrated (08/31/2023)   Social Connection and Isolation Panel    Frequency of Communication with Friends and Family: More than three times a week    Frequency of Social Gatherings with Friends and Family: More than three times a week    Attends Religious Services: 1 to 4 times per year    Active Member of [employer] or Organizations: Yes    Attends Banker Meetings: 1 to 4 times per year    Marital Status: Widowed  Intimate Partner Violence: Not At Risk (08/31/2023)   Humiliation, Afraid, Rape, and Kick questionnaire    Fear of Current or Ex-Partner: No    Emotionally  Abused: No    Physically Abused: No    Sexually Abused: No   Review of Systems: Gen: Denies fever, sweats or chills. No weight loss.  CV: Denies chest pain, palpitations or edema. Resp: See HPI. GI: See HPI. GU : Denies urinary burning, blood in urine, increased urinary frequency or incontinence. MS: Denies joint pain, muscles aches or weakness. Derm: Denies rash, itchiness, skin lesions or unhealing ulcers. Psych: Denies depression, anxiety, memory loss or confusion. Heme: Denies easy bruising, bleeding. Neuro:  Denies headaches, dizziness or paresthesias. Endo:  Denies any problems with DM, thyroid  or adrenal function.  Physical Exam: Vital signs in last 24 hours: Temp:  [97.3 F (36.3 C)-98.8 F (37.1 C)] 98.8 F (37.1 C) (08/29 0732) Pulse Rate:  [75-88] 76 (08/29 0732) Resp:  [15-21] 19 (08/29 0732) BP: (120-169)/(50-81) 129/50 (08/29 0732) SpO2:  [97 %-100 %] 97 % (08/28 2303) Weight:  [54.4 kg] 54.4 kg (08/28 1159)   General: 89 year old female hard of hearing in no acute distress. Head:  Normocephalic and atraumatic. Eyes:  No scleral icterus. Conjunctiva pink. Ears:  Normal auditory acuity. Nose:  No deformity, discharge or lesions. Mouth:  Dentition intact. No ulcers or lesions.  Neck:  Supple. No lymphadenopathy or thyromegaly.  Lungs: Breath sounds clear throughout. No wheezes, rhonchi or crackles.  Heart: Regular rhythm, no murmurs. Abdomen: Soft, nondistended. Mild epigastric tenderness and tenderness throughout the lower abdomen without rebound or guarding.  Positive bowel sounds to all 4 quadrants. No palpable mass. Rectal: Deferred. Musculoskeletal: Symmetrical without gross deformities.  Pulses:  Normal pulses noted. Extremities:  Without clubbing or edema. Neurologic:  Alert and oriented x 4. No focal deficits.  Skin:  Intact without significant lesions or rashes. Psych:  Alert and cooperative. Normal mood and affect.  Intake/Output from previous  day: 08/28 0701 - 08/29 0700 In: 308.5 [I.V.:208.5; IV Piggyback:100] Out: -  Intake/Output this shift: No intake/output data recorded.  Lab Results: Recent Labs    08/31/23 1211  WBC 10.4  HGB 9.7*  HCT 31.8*  PLT 334   BMET Recent Labs    08/31/23 1211  NA 139  K 3.7  CL 103  CO2 22  GLUCOSE 141*  BUN 20  CREATININE 1.16*  CALCIUM 9.1   LFT Recent Labs    08/31/23 1211  PROT 7.5  ALBUMIN 3.6  AST 20  ALT 7  ALKPHOS 112  BILITOT 0.5   PT/INR No results for input(s): LABPROT, INR in the last 72 hours. Hepatitis Panel No results for input(s): HEPBSAG, HCVAB,  HEPAIGM, HEPBIGM in the last 72 hours.    Studies/Results: CT ABDOMEN PELVIS W CONTRAST Result Date: 08/31/2023 CLINICAL DATA:  LLQ abdominal pain abd pain x 1 week. abdominal pain x 1 week. Patient report worsening LLQ abdominal pain last night and this morning accompanied with nausea, denies vomiting EXAM: CT ABDOMEN AND PELVIS WITH CONTRAST TECHNIQUE: Multidetector CT imaging of the abdomen and pelvis was performed using the standard protocol following bolus administration of intravenous contrast. RADIATION DOSE REDUCTION: This exam was performed according to the departmental dose-optimization program which includes automated exposure control, adjustment of the mA and/or kV according to patient size and/or use of iterative reconstruction technique. CONTRAST:  80mL OMNIPAQUE  IOHEXOL  300 MG/ML  SOLN COMPARISON:  CT abdomen pelvis 06/07/2022, CT abdomen pelvis 03/24/2022, CT abdomen pelvis 02/23/2022 FINDINGS: Lower chest: Interval worsening of right lung consolidations and nodular patchy airspace opacities with associated interval development of a couple of right lower lobe cavitation measuring at least 1.4 cm. Small hiatal hernia. Hepatobiliary: No focal liver abnormality. Status post cholecystectomy. No biliary dilatation. Pancreas: Diffusely atrophic. No focal lesion. Otherwise normal pancreatic  contour. No surrounding inflammatory changes. No main pancreatic ductal dilatation. Spleen: Multiple calcifications within the spleen. No focal lesion. The spleen is normal in size. Adrenals/Urinary Tract: No adrenal nodule bilaterally. Bilateral kidneys enhance symmetrically. Fluid density lesions of the kidneys likely represent simple renal cysts. Simple renal cysts, in the absence of clinically indicated signs/symptoms, require no independent follow-up. No hydronephrosis. No hydroureter. The urinary bladder is unremarkable. On delayed imaging, there is no urothelial wall thickening and there are no filling defects in the opacified portions of the bilateral collecting systems or ureters. Stomach/Bowel: Stomach is within normal limits. No evidence of small bowel wall thickening or dilatation. Mild bowel wall thickening and pericolonic fat stranding along the focal diverticula of the distal sigmoid colon (2:66). Colonic diverticulosis. Appendix appears normal Vascular/Lymphatic: No abdominal aorta or iliac aneurysm. Moderate atherosclerotic plaque of the aorta and its branches. No abdominal, pelvic, or inguinal lymphadenopathy. Reproductive: Status post hysterectomy. Other: Redemonstration of stable gas and fluid collection measuring 1.5 x 1.6 x 10.5 cm extending from the vaginal pexy in the setting of a hysterectomy up to the presacral space at the S1 level. No free gas or fluid. Musculoskeletal: No abdominal wall hernia or abnormality. No suspicious lytic or blastic osseous lesions. No acute displaced fracture. IMPRESSION: 1. Interval worsening of right lung consolidation and nodular patchy airspace opacities with associated interval development of a couple of right lower lobe cavitations measuring at least 1.4 cm. Finding may represent infection versus malignancy. Recommend CT chest with intravenous contrast for further evaluation. 2. Colonic diverticulosis with uncomplicated distal sigmoid diverticulitis.  Recommend colonoscopy status post treatment and status post complete resolution of inflammatory changes to exclude an underlying lesion. 3. Redemonstration of stable gas and fluid collection measuring 1.5 x 1.6 x 10.5 cm extending from the vaginal pexy in the setting of a hysterectomy up to the presacral space at the S1 level. 4. Hiatal hernia. 5.  Aortic Atherosclerosis (ICD10-I70.0). Electronically Signed   By: Morgane  Naveau M.D.   On: 08/31/2023 17:26    IMPRESSION/PLAN:  89 year old female with a history of diverticulitis with microperforation 02/2022 admitted with LLQ pain x 1 week. CTAP with contrast 8/28 showed uncomplicated distal sigmoid diverticulitis without perforation or abscess. Abdominal pain is well-controlled at this time.  Hemodynamically stable.  Afebrile. - Clear liquid diet, advance diet as tolerated - Continue Zosyn  IV - IV fluids and  pain management per the hospitalist - No recommendations for colonoscopy at this time  - Await further recommendations per Dr. Legrand - CBC and BMP in am  Acute on chronic normocytic anemia. No overt GI bleeding. CKD a contributing factor. Hg 9.7 (Hg 11.3 one year ago) -> today Hg 9.5.  -CBC, iron , TIBC, ferritin, B12 and folate level in am -Transfuse for Hg < 8    CKD. Cr 1.16. GFR 43.  -Await today's CMP results  Past hysterectomy with vaginal pexy, CTAP 8/28 showed stable gas and collection measuring 6 x 10.5 cm extending from the rectum up to the presacral space, likely related to prior surgery per radiologist.  Bronchiectasis with underlying MAI. CTAP showed Interval worsening of right lung consolidation and nodular patchy airspace opacities with associated interval development of a couple of right lower lobe cavitations measuring at least 1.4 cm. Finding may represent infection versus malignancy. Recommend CT chest with intravenous contrast for further evaluation. -Defer management to the hospitalist -Recommend outpatient follow up  with pulmonologist   Sick sinus syndrome s/p pacemaker placement   History of A-fib on Eliquis   Remote history of GERD, pill dysphagia  -Pantoprazole  40mg  QD     Julie Silva  09/01/2023, 10:03 AM    I have taken an interval history, thoroughly reviewed the chart and examined the patient. I agree with the Advanced Practitioner's note, impression and recommendations, and have recorded additional findings, impressions and recommendations below. I performed a substantive portion of this encounter (>50% time spent), including a complete performance of the medical decision making.  My additional thoughts are as follows:  Sigmoid diverticulitis, radiographically uncomplicated.  Patient well-appearing, lower abdominal tenderness without guarding or rebound.  Stools have turned loose on antibiotics.  This episode is less severe than the one she had about 18 months ago, and I expect she will continue to improve in the next couple of days.  About 48 hours of IV antibiotics will likely be sufficient for her and then convert to oral antibiotics.  She does not need routine outpatient follow-up in our office, and I recommend against a colonoscopy at this juncture.  It would likely be greater risk than benefits.  Discussed plan with the patient and her daughter Julie Silva who is at the bedside during my visit.  Dr. Rollin will cover us  this weekend and be available to see her if needed. _________________  This consultation required a moderate degree of medical decision making due to the nature and complexity of the acute condition(s) being evaluated as well as the patient's medical comorbidities.  Julie Silva Office:[phone]

## 2023-09-01 NOTE — Plan of Care (Signed)
 ?  Problem: Clinical Measurements: ?Goal: Will remain free from infection ?Outcome: Progressing ?  ?Problem: Clinical Measurements: ?Goal: Diagnostic test results will improve ?Outcome: Progressing ?  ?

## 2023-09-01 NOTE — Progress Notes (Signed)
 PROGRESS NOTE  Julie Silva  FMW:[id number] DOB: 20-Feb-1927 DOA: 08/31/2023 PCP: Joshua Debby CROME, MD   Brief Narrative: Patient is a 89 year old female with history of diverticulitis, paroxysmal A-fib on anticoagulation, bronchiectasis, pseudogout, coronary artery disease, recurrent UTI on chronic antibiotic prophylactic therapy, pulmonary MAC infection who presented with complaint of abdominal pain mainly in the left lower quadrant, nausea, low-grade fever, weakness, poor appetite.  On presentation, she was hemodynamically stable.  UA was suspicious for UTI.  CT abdomen/pelvis showed worsening right lung consolidation, nodular patchy opacities with right lower lobe cavitation of 1.4 cm, colonic diverticulosis with distal sigmoid diverticulitis.  Patient admitted for the management of sigmoid diverticulitis, started on antibiotics.  GI consulted and following  Assessment & Plan:  Principal Problem:   Sigmoid diverticulitis  Acute sigmoid diverticulitis: History of diverticulosis with recurrent diverticulitis.  Last admission in February of this year.  Presented with left lower quadrant pain.  Complained of low-grade fever at home.  Afebrile on presentation with a stable blood pressure.  No leukocytosis.  Currently on Zosyn , gentle IV fluids.  Continue pain management, diuretics.  Follow-up cultures. She follows with [facility]. They are following.  Pulmonary MAC infection/history of bronchiectasis: Follows with pulmonology, Dr. Theophilus.  CT showed worsening of right lung consolidation, nodular patchy opacities with a right lower lobe cavitation of 1.4 cm.  Currently on guaifenesin  and  3 % saline nebs twice daily  when she is symptomatic at home.  No cough or shortness of breath.  On room air.  We requested for pulmonology evaluation.  Remains on room air  Recurrent UTI: UA was suspicious for UTI.  Takes cefdinir  at home chronically.  Currently on Zosyn .  Urine culture sent.  She does in and out of  4-5 times a day at home  History of vaginal mesh erosion with fistula: CT imaging showed stable gas/fluid collection of 1.5 X1.6X 10.5 cm extending from vaginal  plexi  up to the presacral space at the S1 in the setting of hysterectomy.  Stable findings  Paroxysmal A-fib: Currently in normal sinus rhythm.  On anticoagulation with Eliquis .  Daughter requested to DC telemetry  Chronic normocytic anemia: Currently hemoglobin stable  Pseudogout: Denies any joint pain at present.  Coronary artery disease: Currently stable .no anginal symptoms.  Deconditioning/weakness: Elderly female with multiple comorbidities.  Will consult PT       DVT prophylaxis:apixaban  (ELIQUIS ) tablet 2.5 mg Start: 08/31/23 2200 apixaban  (ELIQUIS ) tablet 2.5 mg     Code Status: Limited: Do not attempt resuscitation (DNR) -DNR-LIMITED -Do Not Intubate/DNI   Family Communication: Daughter at bedside on 8/29  Patient status: Inpatient  Patient is from :home  Anticipated discharge un:ynfz  Estimated DC date:2-3 days   Consultants: GI,pulmonary  Procedures:None  Antimicrobials:  Anti-infectives (From admission, onward)    Start     Dose/Rate Route Frequency Ordered Stop   09/01/23 0000  piperacillin -tazobactam (ZOSYN ) IVPB 3.375 g        3.375 g 12.5 mL/hr over 240 Minutes Intravenous Every 8 hours 08/31/23 1940     08/31/23 1830  piperacillin -tazobactam (ZOSYN ) IVPB 3.375 g        3.375 g 100 mL/hr over 30 Minutes Intravenous  Once 08/31/23 1816 08/31/23 2033       Subjective: Patient seen and examined at bedside today.  Hard of hearing.  Daughter at bedside.  She looks comfortable.  Remains in normal sinus rhythm.  No cough or shortness of breath.  On room air.  Complains of some abdominal discomfort which is generalized.  Abdomen is soft and nondistended but has generalized tenderness.  Good bowel sounds.  Afebrile this morning.  Objective: Vitals:   08/31/23 2222 08/31/23 2303 09/01/23 0305  09/01/23 0732  BP: (!) 168/61 (!) 152/58  (!) 129/50  Pulse: 88 77  76  Resp: 15 16 (!) 21 19  Temp: 98.1 F (36.7 C) 98.5 F (36.9 C)  98.8 F (37.1 C)  TempSrc: Oral   Oral  SpO2: 97% 97%    Weight:      Height:        Intake/Output Summary (Last 24 hours) at 09/01/2023 0739 Last data filed at 09/01/2023 0400 Gross per 24 hour  Intake 308.48 ml  Output --  Net 308.48 ml   Filed Weights   08/31/23 1159  Weight: 54.4 kg    Examination:  General exam: Overall comfortable, not in distress, chronically deconditioned pleasant elderly female HEENT: PERRL Respiratory system: Crackles on the right side Cardiovascular system: S1 & S2 heard, RRR.  Gastrointestinal system: Abdomen is nondistended, soft .  Good bowel sounds present but has generalized tenderness Central nervous system: Alert and oriented Extremities: No edema, no clubbing ,no cyanosis Skin: No rashes, no ulcers,no icterus     Data Reviewed: I have personally reviewed following labs and imaging studies  CBC: Recent Labs  Lab 08/31/23 1211  WBC 10.4  HGB 9.7*  HCT 31.8*  MCV 95.5  PLT 334   Basic Metabolic Panel: Recent Labs  Lab 08/31/23 1211  NA 139  K 3.7  CL 103  CO2 22  GLUCOSE 141*  BUN 20  CREATININE 1.16*  CALCIUM 9.1     No results found for this or any previous visit (from the past 240 hours).   Radiology Studies: CT ABDOMEN PELVIS W CONTRAST Result Date: 08/31/2023 CLINICAL DATA:  LLQ abdominal pain abd pain x 1 week. abdominal pain x 1 week. Patient report worsening LLQ abdominal pain last night and this morning accompanied with nausea, denies vomiting EXAM: CT ABDOMEN AND PELVIS WITH CONTRAST TECHNIQUE: Multidetector CT imaging of the abdomen and pelvis was performed using the standard protocol following bolus administration of intravenous contrast. RADIATION DOSE REDUCTION: This exam was performed according to the departmental dose-optimization program which includes automated  exposure control, adjustment of the mA and/or kV according to patient size and/or use of iterative reconstruction technique. CONTRAST:  80mL OMNIPAQUE  IOHEXOL  300 MG/ML  SOLN COMPARISON:  CT abdomen pelvis 06/07/2022, CT abdomen pelvis 03/24/2022, CT abdomen pelvis 02/23/2022 FINDINGS: Lower chest: Interval worsening of right lung consolidations and nodular patchy airspace opacities with associated interval development of a couple of right lower lobe cavitation measuring at least 1.4 cm. Small hiatal hernia. Hepatobiliary: No focal liver abnormality. Status post cholecystectomy. No biliary dilatation. Pancreas: Diffusely atrophic. No focal lesion. Otherwise normal pancreatic contour. No surrounding inflammatory changes. No main pancreatic ductal dilatation. Spleen: Multiple calcifications within the spleen. No focal lesion. The spleen is normal in size. Adrenals/Urinary Tract: No adrenal nodule bilaterally. Bilateral kidneys enhance symmetrically. Fluid density lesions of the kidneys likely represent simple renal cysts. Simple renal cysts, in the absence of clinically indicated signs/symptoms, require no independent follow-up. No hydronephrosis. No hydroureter. The urinary bladder is unremarkable. On delayed imaging, there is no urothelial wall thickening and there are no filling defects in the opacified portions of the bilateral collecting systems or ureters. Stomach/Bowel: Stomach is within normal limits. No evidence of small bowel wall thickening or dilatation. Mild  bowel wall thickening and pericolonic fat stranding along the focal diverticula of the distal sigmoid colon (2:66). Colonic diverticulosis. Appendix appears normal Vascular/Lymphatic: No abdominal aorta or iliac aneurysm. Moderate atherosclerotic plaque of the aorta and its branches. No abdominal, pelvic, or inguinal lymphadenopathy. Reproductive: Status post hysterectomy. Other: Redemonstration of stable gas and fluid collection measuring 1.5 x 1.6 x  10.5 cm extending from the vaginal pexy in the setting of a hysterectomy up to the presacral space at the S1 level. No free gas or fluid. Musculoskeletal: No abdominal wall hernia or abnormality. No suspicious lytic or blastic osseous lesions. No acute displaced fracture. IMPRESSION: 1. Interval worsening of right lung consolidation and nodular patchy airspace opacities with associated interval development of a couple of right lower lobe cavitations measuring at least 1.4 cm. Finding may represent infection versus malignancy. Recommend CT chest with intravenous contrast for further evaluation. 2. Colonic diverticulosis with uncomplicated distal sigmoid diverticulitis. Recommend colonoscopy status post treatment and status post complete resolution of inflammatory changes to exclude an underlying lesion. 3. Redemonstration of stable gas and fluid collection measuring 1.5 x 1.6 x 10.5 cm extending from the vaginal pexy in the setting of a hysterectomy up to the presacral space at the S1 level. 4. Hiatal hernia. 5.  Aortic Atherosclerosis (ICD10-I70.0). Electronically Signed   By: Morgane  Naveau M.D.   On: 08/31/2023 17:26    Scheduled Meds:  apixaban   2.5 mg Oral BID   dorzolamide -timolol   1 drop Both Eyes BID   feeding supplement  1 Container Oral BID BM   gabapentin   300 mg Oral BID   guaiFENesin   200 mg Oral TID   latanoprost   1 drop Both Eyes QHS   sodium chloride  HYPERTONIC  4 mL Nebulization BID   Continuous Infusions:  lactated ringers  75 mL/hr at 08/31/23 2101   piperacillin -tazobactam (ZOSYN )  IV 3.375 g (08/31/23 2348)     LOS: 1 day   Ivonne Mustache, MD [facility] P8/29/2025, 7:39 AM

## 2023-09-01 NOTE — Consult Note (Signed)
 Julie Silva, MRN:  [id number], DOB:  [birth year], LOS: 1 ADMISSION DATE:  08/31/2023, CONSULTATION DATE: 09/01/2023 REFERRING MD: Dr. Jillian, CHIEF COMPLAINT: Bronchiectasis MAI infection  History of Present Illness:  Asked to see patient for abnormal CT showing cavitation in underlying infiltrative process of the right base of the lung Patient known to have bronchiectasis, presumed MAI infection  Followed up with Dr. Theophilus in the office, she had decided long ago not to have any invasive intervention Was being managed with nebulization treatments, hypertonic saline at home.  Has been stable, was admitted currently with abdominal pain, CT scan of the abdomen was obtained showing the changes at the base of the lungs Not having any new respiratory complaints CT does show Progressive consolidation at the right base  No fevers, no chills, no worsening cough or sputum production  Health problems include chronic kidney disease stage IIIa, recurrent UTIs choledocholithiasis, GERD  Pertinent  Medical History   Past Medical History:  Diagnosis Date   Anemia    Aortic atherosclerosis (HCC)    Arthritis    Osteoporosis, neck stiffness   Atrial fibrillation (HCC)    Bleeds easily [facility])    father was a hemophiliac - pt not being bothered in recent years.   CAD (coronary artery disease)    Calcium deposit in bursa of knee 2017   Cancer (HCC)    skin cancer basal cell.   Chicken pox    Cyst, Baker's knee    left knee- tx. Cortisone injection 05-05-15 in office- improved pain relief and swelling left ankle and knee   Diverticulitis    Diverticulosis    Dysrhythmia    Dr. McAlhany-cardiology   GERD (gastroesophageal reflux disease)    Glaucoma of both eyes    Gout    Hearing loss of both ears    Bilateral ears- hearing aids used- right ear hearing betther than left.   Hiatal hernia    History of blood transfusion 1957   lots; most related to my periods-last 1968 -s/p  Hysterectomy   Migraines    years ago (02/04/2013)   Motion sickness    back seat - cars, sudden movements   PAF (paroxysmal atrial fibrillation) (HCC)    Pancreatic cyst    Pelvic floor dysfunction    PONV (postoperative nausea and vomiting)    Risk for falls    wobbley per daughter   Self-catheterizes urinary bladder    daily- retained urine and chronic UTI   SVT (supraventricular tachycardia) (HCC)    Dr. KANDICE. Taylor-follows loop recorder left chest   Swallowing difficulty    freq occ. mostley liquids   Urine incontinence    UTI (urinary tract infection)    Chronic Urinary tract infections- tx Macrobid  daily.   Vertigo    Wears hearing aid in both ears    Significant Hospital Events: Including procedures, antibiotic start and stop dates in addition to other pertinent events   CT abdomen 8/28 reviewed  Interim History / Subjective:  Denies any worsening cough or chest pain or discomfort Abdominal pain is better  Objective    Blood pressure (!) 129/50, pulse 76, temperature 98.8 F (37.1 C), temperature source Oral, resp. rate 19, height 5' 4 (1.626 m), weight 54.4 kg, SpO2 97%.        Intake/Output Summary (Last 24 hours) at 09/01/2023 1342 Last data filed at 09/01/2023 0900 Gross per 24 hour  Intake 548.48 ml  Output --  Net 548.48 ml  Filed Weights   08/31/23 1159  Weight: 54.4 kg    Examination: General: Elderly, frail HENT: Moist oral mucosa Lungs: Does have some rales, rhonchi at the right base Cardiovascular: S1-S2 appreciated Abdomen: Soft, bowel sounds appreciated Extremities: No clubbing, no edema Neuro: Awake alert oriented, hard of hearing GU:   Lab data reviewed  Multiple CTs compared CT was reviewed with the patient's daughter at bedside  Resolved problem list   Assessment and Plan   Bronchiectasis Presumed MAI infection  Worsening consolidation with some cavitating changes at the right base of the lung Patient not having any  worsening respiratory complaints at present Chronically managed with hypertonic saline and expectorants  I do not believe any invasive intervention is indicated at present - Continue nebulization treatments - Continue expectorants  Did not tolerate vest therapy previously  I do not believe there is any gain with a CT chest as patient does not desire any invasive intervention  Treatment for MAI involves multiple antibiotics for a prolonged period of time which patient will not be able to tolerate  Continue other current lines of treatment Complete current course of antibiotics for the intra-abdominal process  If new fevers, worsening cough or respiratory complaints, can always get respiratory cultures  Call as needed  Will sign off at present  Jennet Epley, MD [facility] Pager: See Tracey

## 2023-09-02 DIAGNOSIS — K5732 Diverticulitis of large intestine without perforation or abscess without bleeding: Secondary | ICD-10-CM | POA: Diagnosis not present

## 2023-09-02 LAB — BASIC METABOLIC PANEL WITH GFR
Anion gap: 14 (ref 5–15)
BUN: 12 mg/dL (ref 8–23)
CO2: 21 mmol/L — ABNORMAL LOW (ref 22–32)
Calcium: 8.8 mg/dL — ABNORMAL LOW (ref 8.9–10.3)
Chloride: 107 mmol/L (ref 98–111)
Creatinine, Ser: 0.94 mg/dL (ref 0.44–1.00)
GFR, Estimated: 56 mL/min — ABNORMAL LOW (ref >60)
Glucose, Bld: 99 mg/dL (ref 70–99)
Potassium: 3.5 mmol/L (ref 3.5–5.1)
Sodium: 142 mmol/L (ref 135–145)

## 2023-09-02 LAB — IRON AND TIBC
Iron: 17 ug/dL — ABNORMAL LOW (ref 28–170)
Saturation Ratios: 6 % — ABNORMAL LOW (ref 10.4–31.8)
TIBC: 258 ug/dL (ref 250–450)
UIBC: 241 ug/dL

## 2023-09-02 LAB — CBC
HCT: 27.9 % — ABNORMAL LOW (ref 36.0–46.0)
Hemoglobin: 8.4 g/dL — ABNORMAL LOW (ref 12.0–15.0)
MCH: 28.3 pg (ref 26.0–34.0)
MCHC: 30.1 g/dL (ref 30.0–36.0)
MCV: 93.9 fL (ref 80.0–100.0)
Platelets: 338 K/uL (ref 150–400)
RBC: 2.97 MIL/uL — ABNORMAL LOW (ref 3.87–5.11)
RDW: 15 % (ref 11.5–15.5)
WBC: 5.1 K/uL (ref 4.0–10.5)
nRBC: 0 % (ref 0.0–0.2)

## 2023-09-02 LAB — URINE CULTURE: Culture: >=100000 — AB

## 2023-09-02 LAB — FERRITIN: Ferritin: 33 ng/mL (ref 11–307)

## 2023-09-02 MED ORDER — SODIUM CHLORIDE 0.9 % IV SOLN
2.0000 g | Freq: Two times a day (BID) | INTRAVENOUS | Status: DC
  Filled 2023-09-02: qty 12.5

## 2023-09-02 MED ORDER — PIPERACILLIN-TAZOBACTAM 3.375 G IVPB
3.3750 g | Freq: Three times a day (TID) | INTRAVENOUS | Status: DC
  Administered 2023-09-02 – 2023-09-04 (×6): 3.375 g via INTRAVENOUS
  Filled 2023-09-02 (×7): qty 50

## 2023-09-02 MED ORDER — METRONIDAZOLE 500 MG/100ML IV SOLN
500.0000 mg | Freq: Two times a day (BID) | INTRAVENOUS | Status: DC
  Filled 2023-09-02: qty 100

## 2023-09-02 NOTE — Progress Notes (Addendum)
 PROGRESS NOTE  Julie Silva  FMW:[id number] DOB: [birth year] DOA: 08/31/2023 PCP: Joshua Debby CROME, MD   Brief Narrative: As per prior documentation: Close patient is a 89 year old female with history of diverticulitis, paroxysmal A-fib on anticoagulation, bronchiectasis, pseudogout, coronary artery disease, recurrent UTI on chronic antibiotic prophylactic therapy, pulmonary MAC infection who presented with complaint of abdominal pain mainly in the left lower quadrant, nausea, low-grade fever, weakness, poor appetite.  On presentation, she was hemodynamically stable.  UA was suspicious for UTI.  CT abdomen/pelvis showed worsening right lung consolidation, nodular patchy opacities with right lower lobe cavitation of 1.4 cm, colonic diverticulosis with distal sigmoid diverticulitis.  Patient admitted for the management of sigmoid diverticulitis, started on antibiotics.  GI consulted and following  09/02/2023: Patient seen alongside patient's daughter.  Patient is slowly improving.  Urine culture grew Enterococcus.  Antibiotics have been changed to IV cefepime  and Flagyl .  Will likely advance diet slowly from tomorrow.  No new complaints today.  Assessment & Plan:  Principal Problem:   Sigmoid diverticulitis Active Problems:   LLQ abdominal pain  Acute sigmoid diverticulitis: History of diverticulosis with recurrent diverticulitis.  Last admission in February of this year.  Presented with left lower quadrant pain.  Complained of low-grade fever at home.  Afebrile on presentation with a stable blood pressure.  No leukocytosis.  Currently on Zosyn , gentle IV fluids.  Continue pain management, diuretics.  Follow-up cultures. She follows with [facility]. They are following. 09/02/2023: See above documentation.  Continue antibiotics.  Pulmonary MAC infection/history of bronchiectasis: Follows with pulmonology, Dr. Theophilus.  CT showed worsening of right lung consolidation, nodular patchy opacities with a  right lower lobe cavitation of 1.4 cm.  Currently on guaifenesin  and  3 % saline nebs twice daily  when she is symptomatic at home.  No cough or shortness of breath.  On room air.  We requested for pulmonology evaluation.  Remains on room air 09/02/2023: Pulmonary input is highly appreciated.  Recurrent UTI: UA was suspicious for UTI.  Takes cefdinir  at home chronically.  Currently on Zosyn .  Urine culture sent.  She does in and out of 4-5 times a day at home 09/02/2023: IV Zosyn  has been discontinued.  Urine culture grew Enterococcus.  Patient is currently on IV cefepime  and Flagyl .  History of vaginal mesh erosion with fistula: CT imaging showed stable gas/fluid collection of 1.5 X1.6X 10.5 cm extending from vaginal  plexi  up to the presacral space at the S1 in the setting of hysterectomy.  Stable findings  Paroxysmal A-fib: Currently in normal sinus rhythm.  On anticoagulation with Eliquis .  Daughter requested to DC telemetry  Chronic normocytic anemia: Currently hemoglobin stable  Pseudogout: Denies any joint pain at present.  Coronary artery disease: Currently stable .no anginal symptoms.  Deconditioning/weakness: Elderly female with multiple comorbidities.  Will consult PT       DVT prophylaxis:apixaban  (ELIQUIS ) tablet 2.5 mg Start: 08/31/23 2200 apixaban  (ELIQUIS ) tablet 2.5 mg     Code Status: Limited: Do not attempt resuscitation (DNR) -DNR-LIMITED -Do Not Intubate/DNI   Family Communication: Daughter at bedside on 8/29  Patient status: Inpatient  Patient is from :home  Anticipated discharge un:ynfz  Estimated DC date:2-3 days   Consultants: GI,pulmonary  Procedures:None  Antimicrobials:  Anti-infectives (From admission, onward)    Start     Dose/Rate Route Frequency Ordered Stop   09/02/23 1500  ceFEPIme  (MAXIPIME ) 2 g in sodium chloride  0.9 % 100 mL IVPB  2 g 200 mL/hr over 30 Minutes Intravenous Every 12 hours 09/02/23 1329     09/02/23 1500   metroNIDAZOLE  (FLAGYL ) IVPB 500 mg        500 mg 100 mL/hr over 60 Minutes Intravenous Every 12 hours 09/02/23 1329     09/01/23 0000  piperacillin -tazobactam (ZOSYN ) IVPB 3.375 g  Status:  Discontinued        3.375 g 12.5 mL/hr over 240 Minutes Intravenous Every 8 hours 08/31/23 1940 09/02/23 1328   08/31/23 1830  piperacillin -tazobactam (ZOSYN ) IVPB 3.375 g        3.375 g 100 mL/hr over 30 Minutes Intravenous  Once 08/31/23 1816 08/31/23 2033       Subjective: Patient seen Abdominal pain is slowly improving.  Objective: Vitals:   09/01/23 1758 09/01/23 2032 09/02/23 0858 09/02/23 1407  BP:  (!) 129/58  (!) 131/57  Pulse:  69  69  Resp:  16  18  Temp:  97.9 F (36.6 C)  97.9 F (36.6 C)  TempSrc:    Oral  SpO2: 98% 100% 97% 100%  Weight:      Height:       No intake or output data in the 24 hours ending 09/02/23 1457  Filed Weights   08/31/23 1159  Weight: 54.4 kg    Examination:  General exam: Not in distress.  Awake and alert.   HEENT: Patient is pale. Respiratory system: Clear to auscultation. Cardiovascular system: S1 & S2 heard.  Gastrointestinal system: Mild abdominal discomfort lower abdominal area.   Central nervous system: Awake and alert.    Data Reviewed: I have personally reviewed following labs and imaging studies  CBC: Recent Labs  Lab 08/31/23 1211 09/01/23 0805 09/02/23 0715  WBC 10.4 6.8 5.1  HGB 9.7* 9.5* 8.4*  HCT 31.8* 33.3* 27.9*  MCV 95.5 96.2 93.9  PLT 334 346 338   Basic Metabolic Panel: Recent Labs  Lab 08/31/23 1211 09/01/23 0805 09/02/23 0715  NA 139 139 142  K 3.7 3.7 3.5  CL 103 105 107  CO2 22 19* 21*  GLUCOSE 141* 94 99  BUN 20 13 12   CREATININE 1.16* 0.87 0.94  CALCIUM 9.1 8.8* 8.8*     Recent Results (from the past 240 hours)  Urine Culture     Status: Abnormal   Collection Time: 08/31/23  1:15 PM   Specimen: Urine, Clean Catch  Result Value Ref Range Status   Specimen Description   Final    URINE,  CLEAN CATCH Performed at [facility], [address], [place], [place] [address]    Special Requests   Final    NONE Performed at [facility], [address], [place], [place] [address]    Culture >=100,000 COLONIES/mL ENTEROBACTER CLOACAE (A)  Final   Report Status 09/02/2023 FINAL  Final   Organism ID, Bacteria ENTEROBACTER CLOACAE (A)  Final      Susceptibility   Enterobacter cloacae - MIC*    CEFEPIME  <=0.12 SENSITIVE Sensitive     ERTAPENEM <=0.12 SENSITIVE Sensitive     CIPROFLOXACIN  <=0.06 SENSITIVE Sensitive     GENTAMICIN <=1 SENSITIVE Sensitive     NITROFURANTOIN  64 INTERMEDIATE Intermediate     TRIMETH /SULFA  <=20 SENSITIVE Sensitive     PIP/TAZO Value in next row Intermediate ug/mL     64 INTERMEDIATEThis is a modified FDA-approved test that has been validated and its performance characteristics determined by the reporting laboratory.  This laboratory is certified under the Clinical  Laboratory Improvement Amendments CLIA as qualified to perform high complexity clinical laboratory testing.    MEROPENEM Value in next row Sensitive      64 INTERMEDIATEThis is a modified FDA-approved test that has been validated and its performance characteristics determined by the reporting laboratory.  This laboratory is certified under the Clinical Laboratory Improvement Amendments CLIA as qualified to perform high complexity clinical laboratory testing.    * >=100,000 COLONIES/mL ENTEROBACTER CLOACAE     Radiology Studies: CT ABDOMEN PELVIS W CONTRAST Result Date: 08/31/2023 CLINICAL DATA:  LLQ abdominal pain abd pain x 1 week. abdominal pain x 1 week. Patient report worsening LLQ abdominal pain last night and this morning accompanied with nausea, denies vomiting EXAM: CT ABDOMEN AND PELVIS WITH CONTRAST TECHNIQUE: Multidetector CT imaging of the abdomen and pelvis was performed using the standard protocol following bolus administration of intravenous  contrast. RADIATION DOSE REDUCTION: This exam was performed according to the departmental dose-optimization program which includes automated exposure control, adjustment of the mA and/or kV according to patient size and/or use of iterative reconstruction technique. CONTRAST:  80mL OMNIPAQUE  IOHEXOL  300 MG/ML  SOLN COMPARISON:  CT abdomen pelvis 06/07/2022, CT abdomen pelvis 03/24/2022, CT abdomen pelvis 02/23/2022 FINDINGS: Lower chest: Interval worsening of right lung consolidations and nodular patchy airspace opacities with associated interval development of a couple of right lower lobe cavitation measuring at least 1.4 cm. Small hiatal hernia. Hepatobiliary: No focal liver abnormality. Status post cholecystectomy. No biliary dilatation. Pancreas: Diffusely atrophic. No focal lesion. Otherwise normal pancreatic contour. No surrounding inflammatory changes. No main pancreatic ductal dilatation. Spleen: Multiple calcifications within the spleen. No focal lesion. The spleen is normal in size. Adrenals/Urinary Tract: No adrenal nodule bilaterally. Bilateral kidneys enhance symmetrically. Fluid density lesions of the kidneys likely represent simple renal cysts. Simple renal cysts, in the absence of clinically indicated signs/symptoms, require no independent follow-up. No hydronephrosis. No hydroureter. The urinary bladder is unremarkable. On delayed imaging, there is no urothelial wall thickening and there are no filling defects in the opacified portions of the bilateral collecting systems or ureters. Stomach/Bowel: Stomach is within normal limits. No evidence of small bowel wall thickening or dilatation. Mild bowel wall thickening and pericolonic fat stranding along the focal diverticula of the distal sigmoid colon (2:66). Colonic diverticulosis. Appendix appears normal Vascular/Lymphatic: No abdominal aorta or iliac aneurysm. Moderate atherosclerotic plaque of the aorta and its branches. No abdominal, pelvic, or  inguinal lymphadenopathy. Reproductive: Status post hysterectomy. Other: Redemonstration of stable gas and fluid collection measuring 1.5 x 1.6 x 10.5 cm extending from the vaginal pexy in the setting of a hysterectomy up to the presacral space at the S1 level. No free gas or fluid. Musculoskeletal: No abdominal wall hernia or abnormality. No suspicious lytic or blastic osseous lesions. No acute displaced fracture. IMPRESSION: 1. Interval worsening of right lung consolidation and nodular patchy airspace opacities with associated interval development of a couple of right lower lobe cavitations measuring at least 1.4 cm. Finding may represent infection versus malignancy. Recommend CT chest with intravenous contrast for further evaluation. 2. Colonic diverticulosis with uncomplicated distal sigmoid diverticulitis. Recommend colonoscopy status post treatment and status post complete resolution of inflammatory changes to exclude an underlying lesion. 3. Redemonstration of stable gas and fluid collection measuring 1.5 x 1.6 x 10.5 cm extending from the vaginal pexy in the setting of a hysterectomy up to the presacral space at the S1 level. 4. Hiatal hernia. 5.  Aortic Atherosclerosis (ICD10-I70.0). Electronically Signed   By: Morgane  Margarite M.D.   On: 08/31/2023 17:26    Scheduled Meds:  apixaban   2.5 mg Oral BID   dorzolamide -timolol   1 drop Both Eyes BID   feeding supplement  237 mL Oral BID BM   gabapentin   300 mg Oral BID   guaiFENesin   200 mg Oral TID   latanoprost   1 drop Both Eyes QHS   pantoprazole   40 mg Oral Daily   sodium chloride  HYPERTONIC  4 mL Nebulization BID   Continuous Infusions:  ceFEPime  (MAXIPIME ) IV     metronidazole      Addendum: -Addendum is done on 09/02/2023 at 4:32 PM. - Patient's daughter insists on patient being on IV Zosyn . - Will discontinue IV Flagyl  and cefepime  as per patient's daughter's request, and will change patient's antibiotics back to IV Zosyn .  Pharmacy team  has been consulted.    LOS: 2 days   Julie LILLETTE Chapel, MD [facility] P8/30/2025, 2:57 PM

## 2023-09-02 NOTE — Progress Notes (Signed)
 PT Cancellation Note / Screen   Patient Details Name: Julie Silva MRN: [id number] DOB: [birth year]   Cancelled Treatment:    Reason Eval/Treat Not Completed: PT screened, no needs identified, will sign off Daughter present and provided information as pt is very HOH.  Pt lives with daughter and daughter reports pt has been up ambulating and sitting up for hours at a time here in hospital.  Pt typically very active around home.  Daughter declines need for acute or f/u PT at this time.  PT to sign off.   Tari CROME [place] 09/02/2023, 12:25 PM Tari KLEIN, DPT Physical Therapist Acute Rehabilitation Services Office: [phone]

## 2023-09-02 NOTE — TOC CM/SW Note (Signed)
 Transition of Care [facility]) - Inpatient Brief Assessment  Patient Details  Name: Julie Silva MRN: [id number] Date of Birth: 16-Aug-1927  Transition of Care [facility]) CM/SW Contact:    Duwaine GORMAN Aran, LCSW Phone Number: 09/02/2023, 2:46 PM  Clinical Narrative: PT consulted, but signed off as daughter reported the patient is ambulating here and is active at home. No care management needs identified at this time.  Transition of Care Asessment: Insurance and Status: Insurance coverage has been reviewed Patient has primary care physician: Yes Home environment has been reviewed: Resides in single family home with daughter Prior level of function:: Family to assist with ADLs at home as needed Prior/Current Home Services: No current home services Social Drivers of Health Review: SDOH reviewed no interventions necessary Readmission risk has been reviewed: Yes Transition of care needs: no transition of care needs at this time

## 2023-09-02 NOTE — Progress Notes (Addendum)
 Patients daughter is at bedside and request to hold IV antibiotics, flagyl  and cefepime  until further clarification provided by the hospitalist provider. Education provided regarding culture results and recommendations, she request  to continue to hold IV antibiotics at this time.

## 2023-09-02 NOTE — Plan of Care (Signed)
  Problem: Education: Goal: Knowledge of General Education information will improve Description: Including pain rating scale, medication(s)/side effects and non-pharmacologic comfort measures Outcome: Progressing   Problem: Clinical Measurements: Goal: Ability to maintain clinical measurements within normal limits will improve Outcome: Progressing Goal: Will remain free from infection Outcome: Progressing Goal: Diagnostic test results will improve Outcome: Progressing   Problem: Activity: Goal: Risk for activity intolerance will decrease Outcome: Progressing   Problem: Pain Managment: Goal: General experience of comfort will improve and/or be controlled Outcome: Progressing

## 2023-09-03 DIAGNOSIS — K5732 Diverticulitis of large intestine without perforation or abscess without bleeding: Secondary | ICD-10-CM | POA: Diagnosis not present

## 2023-09-03 MED ORDER — ACETAMINOPHEN 650 MG RE SUPP: 650.0000 mg | Freq: Four times a day (QID) | RECTAL | Status: DC | PRN

## 2023-09-03 MED ORDER — ACETAMINOPHEN 500 MG PO TABS
1000.0000 mg | ORAL_TABLET | Freq: Four times a day (QID) | ORAL | Status: DC | PRN
  Administered 2023-09-03: 1000 mg via ORAL
  Filled 2023-09-03 (×2): qty 2

## 2023-09-03 MED ORDER — SODIUM CHLORIDE 3 % IN NEBU
4.0000 mL | INHALATION_SOLUTION | Freq: Two times a day (BID) | RESPIRATORY_TRACT | Status: DC
  Administered 2023-09-04: 4 mL via RESPIRATORY_TRACT
  Filled 2023-09-03 (×3): qty 4

## 2023-09-03 NOTE — Plan of Care (Signed)

## 2023-09-03 NOTE — Progress Notes (Signed)
 PROGRESS NOTE  Julie Silva  FMW:[id number] DOB: 1927-07-24 DOA: 08/31/2023 PCP: Joshua Debby CROME, MD   Brief Narrative: Patient is a 89 year old female with history of diverticulitis, paroxysmal A-fib on anticoagulation, bronchiectasis, pseudogout, coronary artery disease, recurrent UTI on chronic antibiotic prophylactic therapy, pulmonary MAC infection.  Patient presented with complaint of abdominal pain mainly in the left lower quadrant, nausea, low-grade fever, weakness, poor appetite.  On presentation, she was hemodynamically stable.  UA was suspicious for UTI.  Urine culture growing Enterococcus.  CT abdomen/pelvis revealed worsening right lung consolidation, nodular patchy opacities with right lower lobe cavitation of 1.4 cm, colonic diverticulosis with distal sigmoid diverticulitis.  Patient was admitted for the management of sigmoid diverticulitis.  Patient has been on IV Zosyn .  Patient will likely be discharged back home today on oral Augmentin  (10-day course).  Assessment & Plan:  Principal Problem:   Sigmoid diverticulitis Active Problems:   LLQ abdominal pain  Acute sigmoid diverticulitis: History of diverticulosis with recurrent diverticulitis.  Last admission in February of this year.  Presented with left lower quadrant pain.  Complained of low-grade fever at home.  Afebrile on presentation with a stable blood pressure.  No leukocytosis.  Currently on Zosyn , gentle IV fluids.  Continue pain management, diuretics.  Follow-up cultures. She follows with [facility]. They are following. 09/02/2023: See above documentation.  Continue antibiotics. 09/03/2023: Continue IV Zosyn .  Discharge patient tomorrow on 10-day course of oral Augmentin .  Pulmonary MAC infection/history of bronchiectasis: Follows with pulmonology, Dr. Theophilus.  CT showed worsening of right lung consolidation, nodular patchy opacities with a right lower lobe cavitation of 1.4 cm.  Currently on guaifenesin  and  3 % saline  nebs twice daily  when she is symptomatic at home.  No cough or shortness of breath.  On room air.  We requested for pulmonology evaluation.  Remains on room air 09/02/2023: Pulmonary input is highly appreciated.  Recurrent UTI: UA was suspicious for UTI.  Takes cefdinir  at home chronically.  Currently on Zosyn .  Urine culture sent.  She does in and out of 4-5 times a day at home 09/02/2023: IV Zosyn  has been discontinued.  Urine culture grew Enterococcus.  Patient is currently on IV cefepime  and Flagyl . 09/03/2023: Patient's daughter insists on patient being on IV Zosyn .  History of vaginal mesh erosion with fistula: CT imaging showed stable gas/fluid collection of 1.5 X1.6X 10.5 cm extending from vaginal  plexi  up to the presacral space at the S1 in the setting of hysterectomy.  Stable findings  Paroxysmal A-fib: Currently in normal sinus rhythm.  On anticoagulation with Eliquis .  Daughter requested to DC telemetry  Chronic normocytic anemia: Currently hemoglobin stable  Pseudogout: Denies any joint pain at present.  Coronary artery disease: Currently stable .no anginal symptoms.  Deconditioning/weakness: Elderly female with multiple comorbidities.  Will consult PT       DVT prophylaxis:apixaban  (ELIQUIS ) tablet 2.5 mg Start: 08/31/23 2200 apixaban  (ELIQUIS ) tablet 2.5 mg     Code Status: Limited: Do not attempt resuscitation (DNR) -DNR-LIMITED -Do Not Intubate/DNI   Family Communication: Daughter at bedside on 8/29  Patient status: Inpatient  Patient is from :home  Anticipated discharge un:ynfz  Estimated DC date:2-3 days   Consultants: GI,pulmonary  Procedures:None  Antimicrobials:  Anti-infectives (From admission, onward)    Start     Dose/Rate Route Frequency Ordered Stop   09/02/23 1645  piperacillin -tazobactam (ZOSYN ) IVPB 3.375 g        3.375 g 12.5 mL/hr over  240 Minutes Intravenous Every 8 hours 09/02/23 1638     09/02/23 1500  ceFEPIme  (MAXIPIME ) 2 g in  sodium chloride  0.9 % 100 mL IVPB  Status:  Discontinued        2 g 200 mL/hr over 30 Minutes Intravenous Every 12 hours 09/02/23 1329 09/02/23 1631   09/02/23 1500  metroNIDAZOLE  (FLAGYL ) IVPB 500 mg  Status:  Discontinued        500 mg 100 mL/hr over 60 Minutes Intravenous Every 12 hours 09/02/23 1329 09/02/23 1631   09/01/23 0000  piperacillin -tazobactam (ZOSYN ) IVPB 3.375 g  Status:  Discontinued        3.375 g 12.5 mL/hr over 240 Minutes Intravenous Every 8 hours 08/31/23 1940 09/02/23 1328   08/31/23 1830  piperacillin -tazobactam (ZOSYN ) IVPB 3.375 g        3.375 g 100 mL/hr over 30 Minutes Intravenous  Once 08/31/23 1816 08/31/23 2033       Subjective: Patient seen Abdominal pain has resolved significantly  Objective: Vitals:   09/02/23 1407 09/02/23 1939 09/02/23 2029 09/03/23 0755  BP: (!) 131/57  134/60   Pulse: 69  78   Resp: 18  16   Temp: 97.9 F (36.6 C)  98.3 F (36.8 C)   TempSrc: Oral     SpO2: 100% 97%  98%  Weight:      Height:        Intake/Output Summary (Last 24 hours) at 09/03/2023 1203 Last data filed at 09/03/2023 0451 Gross per 24 hour  Intake 73.39 ml  Output --  Net 73.39 ml    Filed Weights   08/31/23 1159  Weight: 54.4 kg    Examination:  General exam: Not in distress.  Awake and alert.   HEENT: Patient is pale. Respiratory system: Clear to auscultation. Cardiovascular system: S1 & S2 heard.  Gastrointestinal system: Soft and nontender. Central nervous system: Awake and alert.    Data Reviewed: I have personally reviewed following labs and imaging studies  CBC: Recent Labs  Lab 08/31/23 1211 09/01/23 0805 09/02/23 0715  WBC 10.4 6.8 5.1  HGB 9.7* 9.5* 8.4*  HCT 31.8* 33.3* 27.9*  MCV 95.5 96.2 93.9  PLT 334 346 338   Basic Metabolic Panel: Recent Labs  Lab 08/31/23 1211 09/01/23 0805 09/02/23 0715  NA 139 139 142  K 3.7 3.7 3.5  CL 103 105 107  CO2 22 19* 21*  GLUCOSE 141* 94 99  BUN 20 13 12   CREATININE  1.16* 0.87 0.94  CALCIUM 9.1 8.8* 8.8*     Recent Results (from the past 240 hours)  Urine Culture     Status: Abnormal   Collection Time: 08/31/23  1:15 PM   Specimen: Urine, Clean Catch  Result Value Ref Range Status   Specimen Description   Final    URINE, CLEAN CATCH Performed at [facility], [address] [place], [place] [address]    Special Requests   Final    NONE Performed at [facility], [address] [place], [place] [address]    Culture >=100,000 COLONIES/mL ENTEROBACTER CLOACAE (A)  Final   Report Status 09/02/2023 FINAL  Final   Organism ID, Bacteria ENTEROBACTER CLOACAE (A)  Final      Susceptibility   Enterobacter cloacae - MIC*    CEFEPIME  <=0.12 SENSITIVE Sensitive     ERTAPENEM <=0.12 SENSITIVE Sensitive     CIPROFLOXACIN  <=0.06 SENSITIVE Sensitive     GENTAMICIN <=1 SENSITIVE Sensitive  NITROFURANTOIN  64 INTERMEDIATE Intermediate     TRIMETH /SULFA  <=20 SENSITIVE Sensitive     PIP/TAZO Value in next row Intermediate ug/mL     64 INTERMEDIATEThis is a modified FDA-approved test that has been validated and its performance characteristics determined by the reporting laboratory.  This laboratory is certified under the Clinical Laboratory Improvement Amendments CLIA as qualified to perform high complexity clinical laboratory testing.    MEROPENEM Value in next row Sensitive      64 INTERMEDIATEThis is a modified FDA-approved test that has been validated and its performance characteristics determined by the reporting laboratory.  This laboratory is certified under the Clinical Laboratory Improvement Amendments CLIA as qualified to perform high complexity clinical laboratory testing.    * >=100,000 COLONIES/mL ENTEROBACTER CLOACAE     Radiology Studies: No results found.   Scheduled Meds:  apixaban   2.5 mg Oral BID   dorzolamide -timolol   1 drop Both Eyes BID   feeding supplement  237 mL Oral BID BM   gabapentin    300 mg Oral BID   guaiFENesin   200 mg Oral TID   latanoprost   1 drop Both Eyes QHS   pantoprazole   40 mg Oral Daily   sodium chloride  HYPERTONIC  4 mL Nebulization BID   sodium chloride  HYPERTONIC  4 mL Nebulization BID   Continuous Infusions:  piperacillin -tazobactam (ZOSYN )  IV 3.375 g (09/03/23 0924)     LOS: 3 days   Leatrice LILLETTE Chapel, MD [facility] P8/31/2025, 12:03 PM

## 2023-09-04 DIAGNOSIS — K5732 Diverticulitis of large intestine without perforation or abscess without bleeding: Secondary | ICD-10-CM | POA: Diagnosis not present

## 2023-09-04 MED ORDER — AMOXICILLIN-POT CLAVULANATE 875-125 MG PO TABS: 1.0000 | ORAL_TABLET | Freq: Two times a day (BID) | ORAL | 0 refills | Status: DC

## 2023-09-04 MED ORDER — AMOXICILLIN-POT CLAVULANATE 600-42.9 MG/5ML PO SUSR: 600.0000 mg | Freq: Two times a day (BID) | ORAL | 0 refills | Status: AC

## 2023-09-04 NOTE — Discharge Summary (Addendum)
 Physician Discharge Summary   Patient: Julie Silva MRN: [id number] DOB: [birth year]  Admit date:     08/31/2023  Discharge date: 09/04/23  Discharge Physician: Daved JAYSON Pump   PCP: Joshua Debby CROME, MD   Recommendations at discharge:    Take antibiotics as prescribed.  Please follow up with your PCP and GI specialist within 1-2 weeks   Discharge Diagnoses: Principal Problem:   Sigmoid diverticulitis  Resolved Problems:   LLQ abdominal pain  Hospital Course: Per admitting provider's HPI: Julie Silva is a 89 y.o. year old female with PMH of history of diverticulitis admission on 2/24, PAF on anticoagulation, bronchiectasis, pseudogout, CAD, recurrent UTI on chronic cefdinir  for prophylaxis, history of vaginal mesh erosion with fistula, pulmonary MAC infection presents with abdominal pain for about a week on the left lower quadrant worsening since last night and with associated nausea.  Patient has been having low-grade temperature feeling feverish x 2 days and decreased energy weakness patient denied any vomiting, diarrhea shortness of breath or chest pain.  She has cloudy urine at baseline.  Zosyn  was given along with 500 cc normal saline and admission requested. She has baseline shortness of breath with activity. Has poor appetite, was on hospice in the past when her weight was around 80 lb now in 113lb and graduated from hospice and on boost ~ 2 a day and 10% fat yogurt daily. Patient otherwise denies any vomiting, chest pain, headache, focal weakness, numbness tingling, speech difficulties   In the ED: Afebrile blood pressure 130/61 saturating well on room air, labs reviewed fairly stable CMP creatinine 1.1 CBC with hemoglobin 9.7 UA grossly abnormal WBC more than 50 RBC 11-20, L LE large nitrate positive urine culture ordered CT abdomen pelvis with contrast> worsening of right lung consolidation and nodular patchy opacities with right lower lobe cavitation 1.4 cm-infectious versus  malignancy etiology advising CT chest with IV contrast.  Other finding colonic diverticulosis with distal sigmoid diverticulitis, gas and fluid collection 1.5 X1.6X 10.5 cm extending from the vaginal plexi into the setting of hysterectomy up to the presacral space at the S1.  Aortic atherosclerosis  Patient was admitted for the management of sigmoid diverticulitis.  Patient has been on IV Zosyn .  Patient will likely be discharged back home today on oral Augmentin  (10-day course). Patient seen with daughter present at bedside today. Eager for discharge. Reports improvement in abdominal pain and diarrhea. Has been afebrile for over 24 hours without leukocytosis. She is tolerating diet and back to baseline per daughter.  Medically stable for discharge home at this time. All questions answered. Patient and daughter demonstrate understanding and are in agreement with the plan.  Daughter will call and arrange GI follow up with patient's primary GI.     Objective: Vitals last 24 hrs: Vitals:   08/31/23 1330 08/31/23 1534 08/31/23 1743 08/31/23 1841  BP: (!) 120/52 137/64  (!) 169/81  Pulse: 76 75  84  Resp: 18 16  20   Temp:   98.4 F (36.9 C)   TempSrc:   Oral   SpO2: 97% 99%  100%  Weight:      Height:        Physical Examination: General exam: alert awake, oriented,  hard of hearing HEENT:Oral mucosa moist, Ear/Nose WNL grossly Respiratory system: Bilaterally clear BS,no use of accessory muscle Cardiovascular system:  No JVD or LE edema  Gastrointestinal system: Abdomen soft, non-tender to palpation  Nervous System: Alert, awake, moving all extremities,and following commands. Extremities:  LE edema neg, distal extremities warm.  Skin: No rashes,no icterus. MSK: Normal muscle bulk,tone, power   Medications reviewed:  Scheduled Meds:  apixaban   2.5 mg Oral BID   dorzolamide -timolol   1 drop Both Eyes BID   [START ON 09/01/2023] feeding supplement  1 Container Oral BID BM   gabapentin    300 mg Oral BID   guaifenesin   200 mg Oral TID   latanoprost   1 drop Both Eyes QHS   sodium chloride  HYPERTONIC  4 mL Nebulization BID   Continuous Infusions:  lactated ringers        Assessment and Plan: No notes have been filed under this hospital service. Service: Hospitalist  Assessment and plan: Principal Problem:   Sigmoid diverticulitis  Sigmoid diverticulitis: Had admission for diverticulitis back in February.   Treated with Zosyn , ivf, pain control, antiemetics. Transition to PO Augmentin  to complete course of antibiotics outpatient  GI input appreciated    Pulmonary MAC infection Bronchiectasis: Ct shows worsening of right lung consolidation and nodular patchy opacities with right lower lobe cavitation 1.4 cm.  She is followed by Dr. Theophilus and managing with guaifenesin  and 3% NS nebs bid and antibiotics when she is symptomatics. No cough or shortess of breath  Pulmonology consulted while here, no further testing indicated while inpatient .  Follow up with primary Pulmonologist/managing physician.   history of vaginal mesh erosion with fistula: Redemonstrated of stable gas and fluid collection in the CT scan.     PAF: Cont on anticoagulation  Chronic anemia: Hb stable. F/u PCP   Recurrent UTI on chronic cefdinir : Pt asymptomatic. Culture reviewed  She does in and out cath 4-5 times a day will continue. At this time does not warrant treatment, ok to continue outpatient cefdinir        Pain control - [place]  Controlled Substance Reporting System database was reviewed. and patient was instructed, not to drive, operate heavy machinery, perform activities at heights, swimming or participation in water activities or provide baby-sitting services while on Pain, Sleep and Anxiety Medications; until their outpatient Physician has advised to do so again. Also recommended to not to take more than prescribed Pain, Sleep and Anxiety Medications.  Consultants:  Pulmonology, Gastroenterology Procedures performed: none Disposition: Home Diet recommendation:  Discharge Diet Orders (From admission, onward)     Start     Ordered   09/04/23 0000  Diet - low sodium heart healthy        09/04/23 1350            DISCHARGE MEDICATION: Allergies as of 09/04/2023       Reactions   Codeine Other (See Comments)   Passed out   Colchicine  Diarrhea   Tape Other (See Comments)   SKIN IS VERY THIN!!! IT TEARS EASILY!!        Medication List     TAKE these medications    acetaminophen  500 MG tablet Commonly known as: TYLENOL  Take 1,000 mg by mouth See admin instructions. Take 1,000 mg by mouth at 6 AM and 8 PM- may take an additional 1,000 mg once a day as needed for pain   amoxicillin -clavulanate 600-42.9 MG/5ML suspension Commonly known as: AUGMENTIN  Take 5 mLs (600 mg total) by mouth 2 (two) times daily for 10 days.   apixaban  2.5 MG Tabs tablet Commonly known as: Eliquis  Take 1 tablet (2.5 mg total) by mouth 2 (two) times daily.   BENEFIBER PO Take 4 g by mouth See admin instructions. Mix 4 grams (1 teaspoonful)  into a glass of milk and drink by mouth 2 times a day   Catheters Misc Replace catheter twice daily.   cefdinir  250 MG/5ML suspension Commonly known as: OMNICEF  Take 5 mLs (250 mg total) by mouth daily. What changed: when to take this   dorzolamide -timolol  2-0.5 % ophthalmic solution Commonly known as: COSOPT  Place 1 drop into both eyes 2 (two) times daily.   estradiol 0.1 MG/GM vaginal cream Commonly known as: ESTRACE Place 0.5 g vaginally 3 (three) times a week.   gabapentin  300 MG capsule Commonly known as: NEURONTIN  Take 1 capsule (300 mg total) by mouth 2 (two) times daily. What changed:  when to take this additional instructions   guaifenesin  100 MG/5ML syrup Commonly known as: ROBITUSSIN Take 200 mg by mouth 3 (three) times daily.   latanoprost  0.005 % ophthalmic solution Commonly known as:  XALATAN  Place 1 drop into both eyes at bedtime.   OMEPRAZOLE  PO Take 20 mg by mouth See admin instructions. Dissolve 20 mg and place into applesauce- take by mouth at 6 AM   omeprazole  20 MG capsule Commonly known as: PRILOSEC TAKE 1 CAPSULE BY MOUTH EVERY DAY   ondansetron  4 MG disintegrating tablet Commonly known as: ZOFRAN -ODT Take 1 tablet (4 mg total) by mouth every 8 (eight) hours as needed for nausea or vomiting. What changed: how much to take   PreserVision AREDS 2 Chew Chew 1 tablet by mouth 2 (two) times daily.   prochlorperazine  10 MG tablet Commonly known as: COMPAZINE  Take 2.5 mg by mouth every 4 (four) hours as needed for nausea or vomiting.   sodium chloride  HYPERTONIC 3 % nebulizer solution INHALE 1 VIAL VIA NEBULIZER ONCE A DAY What changed: See the new instructions.   Systane 0.4-0.3 % Gel ophthalmic gel Generic drug: Polyethyl Glycol-Propyl Glycol Place 1 Application into both eyes 3 (three) times daily as needed (for irritation and dryness).   traMADol  50 MG tablet Commonly known as: ULTRAM  Take 25 mg by mouth every 6 (six) hours as needed (for kidney stone pain).        Follow-up Information     Joshua Debby CROME, MD. Schedule an appointment as soon as possible for a visit in 1 week(s).   Specialty: Internal Medicine Contact information: [address] [place] [address] [phone]                Discharge Exam: Fredricka Weights   08/31/23 1159  Weight: 54.4 kg   General exam: alert awake, oriented,  hard of hearing HEENT:Oral mucosa moist, Ear/Nose WNL grossly Respiratory system: Bilaterally clear BS,no use of accessory muscle Cardiovascular system:  No JVD or LE edema  Gastrointestinal system: Abdomen soft, non-tender to palpation  Nervous System: Alert, awake, moving all extremities,and following commands. Extremities: LE edema neg, distal extremities warm.  Skin: No rashes,no icterus. MSK: Normal muscle bulk,tone, power    Condition at discharge: stable  The results of significant diagnostics from this hospitalization (including imaging, microbiology, ancillary and laboratory) are listed below for reference.   Imaging Studies: CT ABDOMEN PELVIS W CONTRAST Result Date: 08/31/2023 CLINICAL DATA:  LLQ abdominal pain abd pain x 1 week. abdominal pain x 1 week. Patient report worsening LLQ abdominal pain last night and this morning accompanied with nausea, denies vomiting EXAM: CT ABDOMEN AND PELVIS WITH CONTRAST TECHNIQUE: Multidetector CT imaging of the abdomen and pelvis was performed using the standard protocol following bolus administration of intravenous contrast. RADIATION DOSE REDUCTION: This exam was performed according to  the departmental dose-optimization program which includes automated exposure control, adjustment of the mA and/or kV according to patient size and/or use of iterative reconstruction technique. CONTRAST:  80mL OMNIPAQUE  IOHEXOL  300 MG/ML  SOLN COMPARISON:  CT abdomen pelvis 06/07/2022, CT abdomen pelvis 03/24/2022, CT abdomen pelvis 02/23/2022 FINDINGS: Lower chest: Interval worsening of right lung consolidations and nodular patchy airspace opacities with associated interval development of a couple of right lower lobe cavitation measuring at least 1.4 cm. Small hiatal hernia. Hepatobiliary: No focal liver abnormality. Status post cholecystectomy. No biliary dilatation. Pancreas: Diffusely atrophic. No focal lesion. Otherwise normal pancreatic contour. No surrounding inflammatory changes. No main pancreatic ductal dilatation. Spleen: Multiple calcifications within the spleen. No focal lesion. The spleen is normal in size. Adrenals/Urinary Tract: No adrenal nodule bilaterally. Bilateral kidneys enhance symmetrically. Fluid density lesions of the kidneys likely represent simple renal cysts. Simple renal cysts, in the absence of clinically indicated signs/symptoms, require no independent follow-up. No  hydronephrosis. No hydroureter. The urinary bladder is unremarkable. On delayed imaging, there is no urothelial wall thickening and there are no filling defects in the opacified portions of the bilateral collecting systems or ureters. Stomach/Bowel: Stomach is within normal limits. No evidence of small bowel wall thickening or dilatation. Mild bowel wall thickening and pericolonic fat stranding along the focal diverticula of the distal sigmoid colon (2:66). Colonic diverticulosis. Appendix appears normal Vascular/Lymphatic: No abdominal aorta or iliac aneurysm. Moderate atherosclerotic plaque of the aorta and its branches. No abdominal, pelvic, or inguinal lymphadenopathy. Reproductive: Status post hysterectomy. Other: Redemonstration of stable gas and fluid collection measuring 1.5 x 1.6 x 10.5 cm extending from the vaginal pexy in the setting of a hysterectomy up to the presacral space at the S1 level. No free gas or fluid. Musculoskeletal: No abdominal wall hernia or abnormality. No suspicious lytic or blastic osseous lesions. No acute displaced fracture. IMPRESSION: 1. Interval worsening of right lung consolidation and nodular patchy airspace opacities with associated interval development of a couple of right lower lobe cavitations measuring at least 1.4 cm. Finding may represent infection versus malignancy. Recommend CT chest with intravenous contrast for further evaluation. 2. Colonic diverticulosis with uncomplicated distal sigmoid diverticulitis. Recommend colonoscopy status post treatment and status post complete resolution of inflammatory changes to exclude an underlying lesion. 3. Redemonstration of stable gas and fluid collection measuring 1.5 x 1.6 x 10.5 cm extending from the vaginal pexy in the setting of a hysterectomy up to the presacral space at the S1 level. 4. Hiatal hernia. 5.  Aortic Atherosclerosis (ICD10-I70.0). Electronically Signed   By: Morgane  Naveau M.D.   On: 08/31/2023 17:26     Microbiology: Results for orders placed or performed during the hospital encounter of 08/31/23  Urine Culture     Status: Abnormal   Collection Time: 08/31/23  1:15 PM   Specimen: Urine, Clean Catch  Result Value Ref Range Status   Specimen Description   Final    URINE, CLEAN CATCH Performed at [facility], [address], [place], [place] [address]    Special Requests   Final    NONE Performed at [facility], [address], [place], [place] [address]    Culture >=100,000 COLONIES/mL ENTEROBACTER CLOACAE (A)  Final   Report Status 09/02/2023 FINAL  Final   Organism ID, Bacteria ENTEROBACTER CLOACAE (A)  Final      Susceptibility   Enterobacter cloacae - MIC*    CEFEPIME  <=0.12 SENSITIVE Sensitive     ERTAPENEM <=0.12 SENSITIVE  Sensitive     CIPROFLOXACIN  <=0.06 SENSITIVE Sensitive     GENTAMICIN <=1 SENSITIVE Sensitive     NITROFURANTOIN  64 INTERMEDIATE Intermediate     TRIMETH /SULFA  <=20 SENSITIVE Sensitive     PIP/TAZO Value in next row Intermediate ug/mL     64 INTERMEDIATEThis is a modified FDA-approved test that has been validated and its performance characteristics determined by the reporting laboratory.  This laboratory is certified under the Clinical Laboratory Improvement Amendments CLIA as qualified to perform high complexity clinical laboratory testing.    MEROPENEM Value in next row Sensitive      64 INTERMEDIATEThis is a modified FDA-approved test that has been validated and its performance characteristics determined by the reporting laboratory.  This laboratory is certified under the Clinical Laboratory Improvement Amendments CLIA as qualified to perform high complexity clinical laboratory testing.    * >=100,000 COLONIES/mL ENTEROBACTER CLOACAE    Labs: CBC: Recent Labs  Lab 08/31/23 1211 09/01/23 0805 09/02/23 0715  WBC 10.4 6.8 5.1  HGB 9.7* 9.5* 8.4*  HCT 31.8* 33.3* 27.9*  MCV 95.5 96.2 93.9  PLT 334 346 338    Basic Metabolic Panel: Recent Labs  Lab 08/31/23 1211 09/01/23 0805 09/02/23 0715  NA 139 139 142  K 3.7 3.7 3.5  CL 103 105 107  CO2 22 19* 21*  GLUCOSE 141* 94 99  BUN 20 13 12   CREATININE 1.16* 0.87 0.94  CALCIUM 9.1 8.8* 8.8*   Liver Function Tests: Recent Labs  Lab 08/31/23 1211 09/01/23 0805  AST 20 18  ALT 7 5  ALKPHOS 112 101  BILITOT 0.5 0.5  PROT 7.5 7.2  ALBUMIN 3.6 3.3*   CBG: No results for input(s): GLUCAP in the last 168 hours.  Discharge time spent: greater than 30 minutes.  Signed: Daved JAYSON Pump, DO [facility] 09/04/2023

## 2023-09-04 NOTE — Progress Notes (Signed)
 AVS reviewed with patient  and daughter - both verbalized an understanding. Call to [facility] in [place] confirmed that they would not be able to substitute the Augmentin  tablet with a suspension. New script requested for Augmentin  600mg /5 ml- request sent to Dr Collie- [facility] [place]. New AVS printed w/ updated med info/ Pt and daughter updated. PIV removed as noted. Pt dressing for d/c to home.

## 2023-09-04 NOTE — Plan of Care (Signed)

## 2023-09-05 ENCOUNTER — Telehealth: Payer: Self-pay | Admitting: *Deleted

## 2023-09-05 NOTE — Transitions of Care (Post Inpatient/ED Visit) (Signed)
   09/05/2023  Name: Julie Silva MRN: [id number] DOB: Dec 11, 1927  Today's TOC FU Call Status: Today's TOC FU Call Status:: Successful TOC FU Call Completed TOC FU Call Complete Date: 09/05/23 Patient's Name and Date of Birth confirmed.  Transition Care Management Follow-up Telephone Call Date of Discharge: 09/04/23 Discharge Facility: [facility]) Type of Discharge: Inpatient Admission Primary Inpatient Discharge Diagnosis:: Sigmoid diverticulitis How have you been since you were released from the hospital?: Better (doing well) Any questions or concerns?: No  Items Reviewed: Did you receive and understand the discharge instructions provided?: Yes Medications obtained,verified, and reconciled?: Yes (Medications Reviewed) Any new allergies since your discharge?: No Dietary orders reviewed?: No Do you have support at home?: Yes People in Home [RPT]: alone Name of Support/Comfort Primary Source: Pat white  Medications Reviewed Today: Medications Reviewed Today   Medications were not reviewed in this encounter     Home Care and Equipment/Supplies: Were Home Health Services Ordered?: NA Any new equipment or medical supplies ordered?: NA  Functional Questionnaire: Do you need assistance with bathing/showering or dressing?: No Do you need assistance with meal preparation?: Yes Do you need assistance with eating?: No Do you have difficulty maintaining continence: No Do you need assistance with getting out of bed/getting out of a chair/moving?: No Do you have difficulty managing or taking your medications?: No  Follow up appointments reviewed: PCP Follow-up appointment confirmed?: No MD Provider Line Number:[phone] Given: Yes (Daughter will make follow up appt) Specialist Hospital Follow-up appointment confirmed?: NA Do you need transportation to your follow-up appointment?: No Do you understand care options if your condition(s) worsen?: Yes-patient verbalized  understanding  SDOH Interventions Today    Flowsheet Row Most Recent Value  SDOH Interventions   Depression Interventions/Treatment  PHQ2-9 Score <4 Follow-up Not Indicated   Daughter will call for follow up appointment with PCP for when patient has completed her antibiotics Cathlean Headland BSN RN [facility] Care Management Coordinator [email or web address] Direct Dial: [phone]  Fax: [phone] Website: [email or web address]

## 2023-10-03 IMAGING — CT CT ABD-PELV W/ CM
2 of 6 series · 13 of 46 positions shown, 15 images · IV contrast (OMNIPAQUE)
Comparison: CT chest 09/25/2020;

CLINICAL DATA: Patient complains of left lower quadrant abdominal
pain with diarrhea. History of skin cancer, hysterectomy and a
pacemaker.

EXAM:
CT ABDOMEN AND PELVIS WITH CONTRAST
TECHNIQUE: Multidetector CT imaging of the abdomen and pelvis was performed
using the standard protocol following bolus administration of
intravenous contrast.
CONTRAST:  80mL OMNIPAQUE IOHEXOL 350 MG/ML SOLN

[Series 2: axial st · axial · 0.67mm/px · z∈[-507,-157]mm · 10 of 82 slices shown, 12 images]
[im 6/82  soft-tissue]
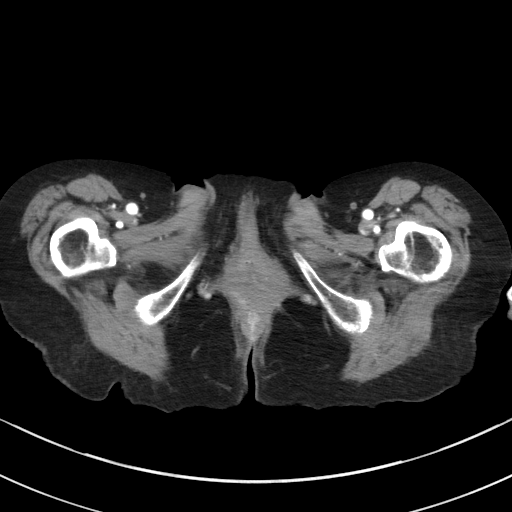
[im 6/82  bone]
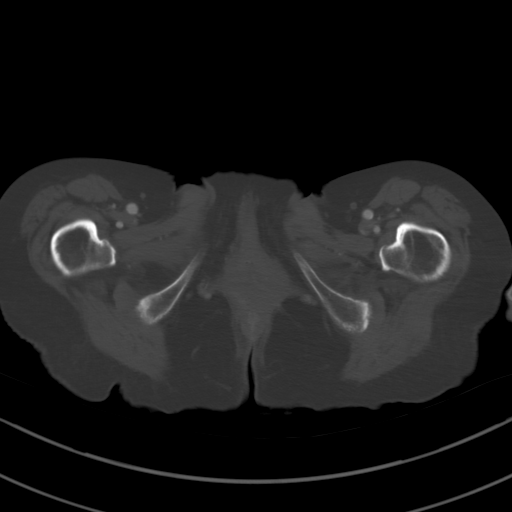
[im 17/82  soft-tissue]
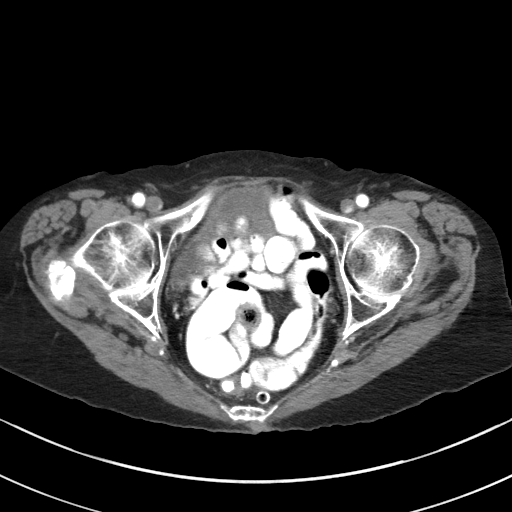
[im 22/82  soft-tissue]
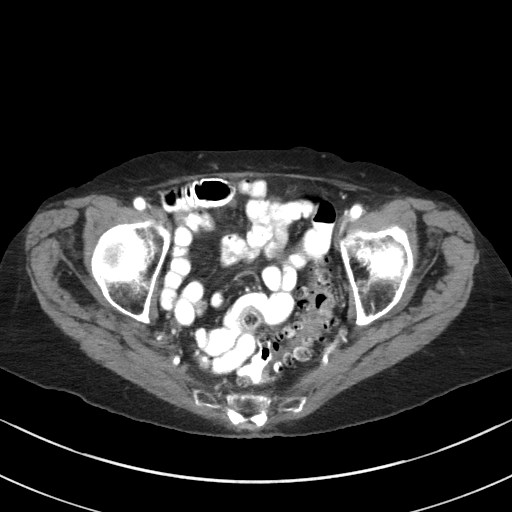
[im 28/82  soft-tissue]
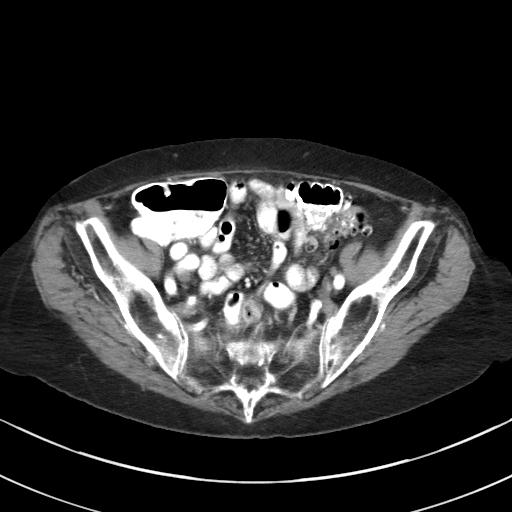
[im 38/82  soft-tissue]
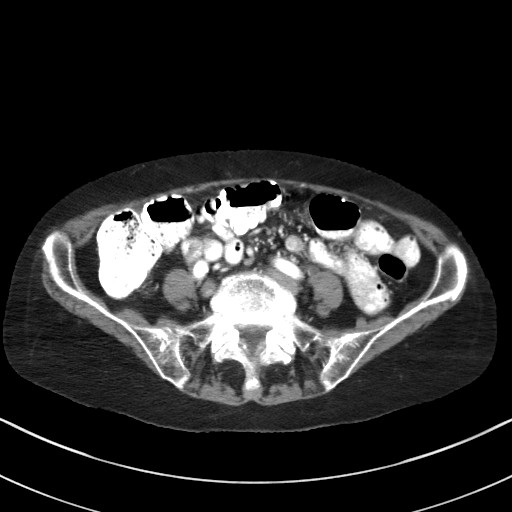
[im 44/82  soft-tissue]
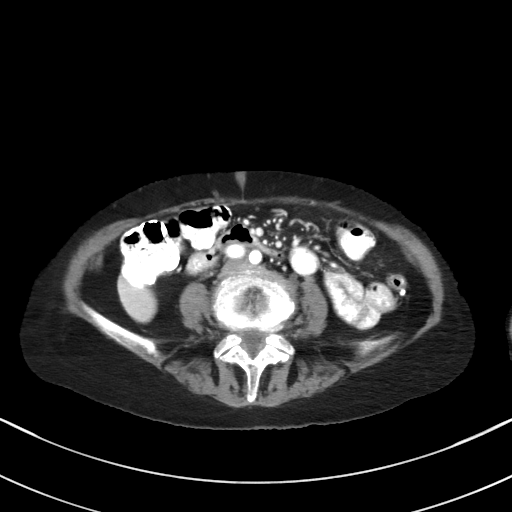
[im 55/82  soft-tissue]
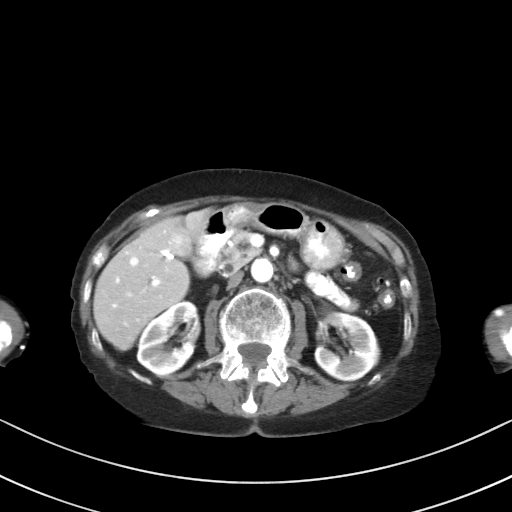
[im 60/82  soft-tissue]
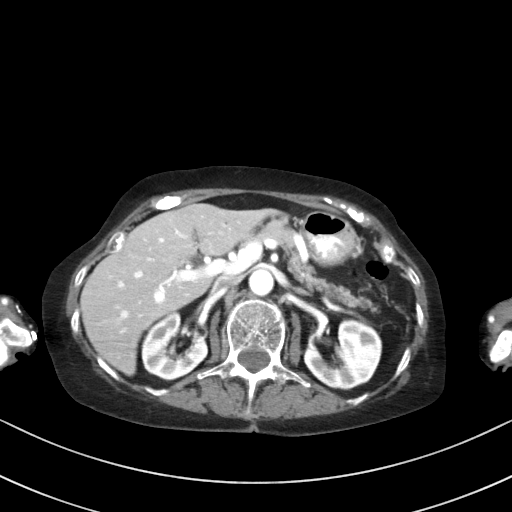
[im 65/82  soft-tissue]
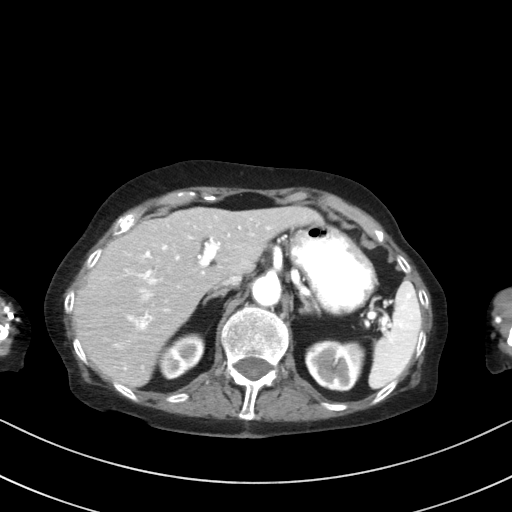
[im 65/82  bone]
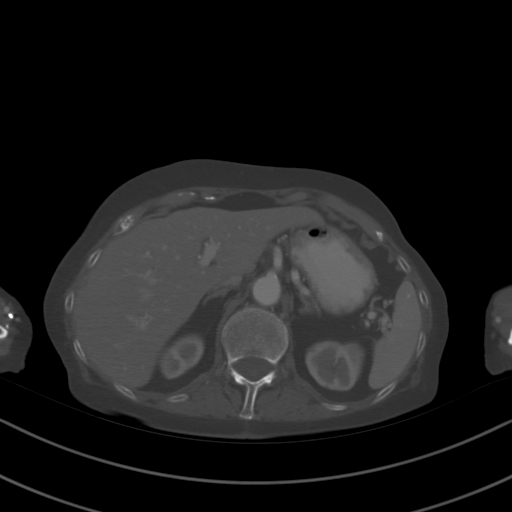
[im 76/82  soft-tissue]
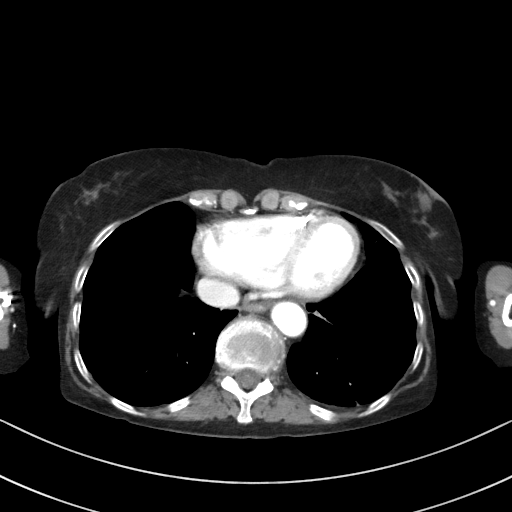

[Series 5: coronal st · coronal · 0.59mm/px · 3 of 97 slices shown]
[im 20/97  soft-tissue]
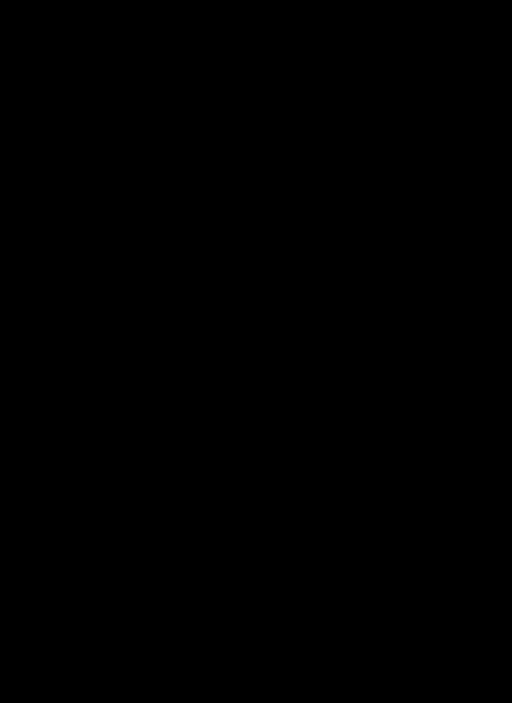
[im 39/97  soft-tissue]
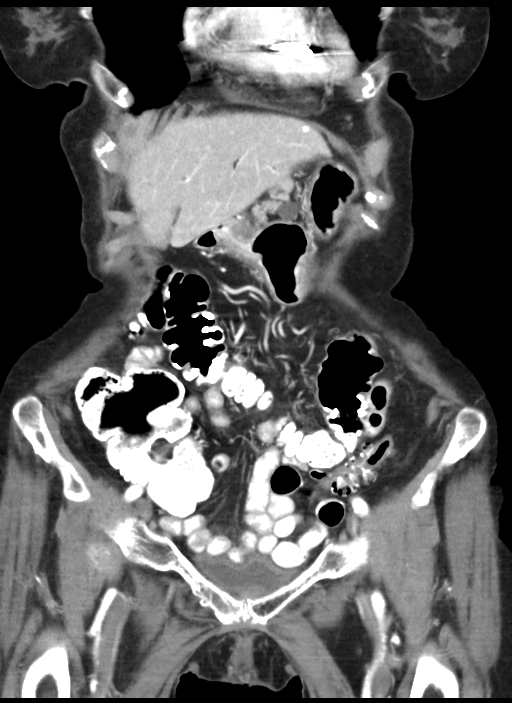
[im 58/97  soft-tissue]
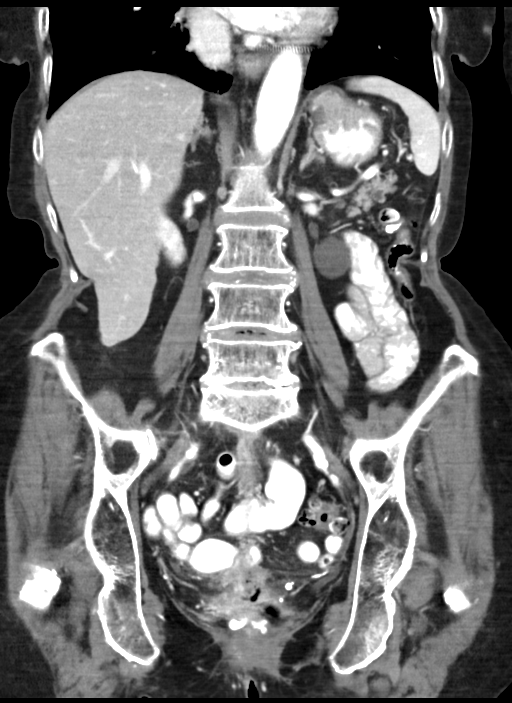

[13 of 46 positions shown; findings below may reference images not displayed]

x-ray abdomen 02/06/2017; U/S
renal 04/15/2013; CT abdomen-pelvis/MR abdomen 02/03/2013.
FINDINGS: Lower chest: No significant pulmonary nodules or acute consolidative
airspace disease. Leadless ICD in the right ventricular apex.

Hepatobiliary: Normal liver size. Granulomatous lateral segment left
liver calcification is unchanged. Subcentimeter hypodense left liver
dome lesion is too small to characterize and unchanged, considered
benign. No new liver lesions. Cholecystectomy. Bile ducts are stable
and within normal post cholecystectomy limits with CBD diameter 6
mm. Intrahepatic pneumobilia as on the prior scan, presumably due to
a history of sphincterotomy.

Pancreas: A few small cystic pancreatic lesions scattered throughout
the pancreas, largest 1.3 cm in the anterior pancreatic body (series
5/image 44), difficult to compare to the prior unenhanced CT abdomen
study. No pancreatic duct dilation.

Spleen: Normal size spleen. Stable scattered granulomatous splenic
calcifications.

Adrenals/Urinary Tract: Normal adrenals. Simple 2.2 cm medial lower
left renal cyst. Several subcentimeter hypodense renal cortical
lesions in both kidneys are too small to characterize and require no
follow-up. No hydronephrosis. Small cystocele. Bladder is not
significantly distended and contains a tiny amount of nonspecific
layering hyperdense material within the cystocele (series 2/image
73). Tiny focus of gas in the nondependent bladder lumen.

Stomach/Bowel: Small hiatal hernia. Otherwise normal nondistended
stomach. Normal caliber small bowel with no small bowel wall
thickening. Several small bowel loops are located within a small
enterocele in the deep pelvis (series 2/image 73). Appendectomy.
Oral contrast transits to the rectum. Marked sigmoid diverticulosis,
with no significant large bowel wall thickening or acute pericolonic
fat stranding.

Vascular/Lymphatic: Atherosclerotic nonaneurysmal abdominal aorta.
Patent portal, splenic, hepatic and renal veins. No pathologically
enlarged lymph nodes in the abdomen or pelvis.

Reproductive: Status post hysterectomy. There is evidence of
previous vaginal-pexy from the hysterectomy margin to the midline
anterior upper sacral promontory. There is new prominent feculent
material and enteric contrast distending the vagina (series 6/image
49), likely indicating a rectovaginal fistula.

Other: No pneumoperitoneum, ascites or focal fluid collection.

Musculoskeletal: No aggressive appearing focal osseous lesions.
Moderate lumbar spondylosis.
IMPRESSION: 1. Evidence of pelvic floor dysfunction with small cystocele, small
enterocele and evidence of previous vaginal-pexy from the
hysterectomy margin to the midline anterior upper sacral promontory.
New findings concerning for rectovaginal fistula, with new prominent
feculent material and enteric contrast distending the vagina.
Surgical consultation suggested.
2. Small amount of layering nonspecific hyperdense material within
the cystocele. Tiny focus of gas in the nondependent bladder.
Findings could be secondary to inadvertently introduced material in
the bladder lumen from reported self-catheterization, with a
rectovesical fistula not excluded.
3. Marked sigmoid diverticulosis, with no evidence of acute
diverticulitis. No evidence of bowel obstruction or acute bowel
inflammation.
4. Small hiatal hernia.
5. A few small cystic pancreatic lesions scattered throughout the
pancreas, largest 1.3 cm in the anterior pancreatic body , difficult
to compare to the prior unenhanced CT abdomen study. No overtly
suspicious features. No biliary or pancreatic duct dilation. MRI
abdomen without and with IV contrast may be obtained if clinically
warranted given patient comorbidities.
6. Aortic Atherosclerosis ([id number]-475.5).

These results will be called to the ordering clinician or
representative by the Radiologist Assistant, and communication
documented in the PACS or [REDACTED].

## 2023-10-18 DIAGNOSIS — Z23 Encounter for immunization: Secondary | ICD-10-CM | POA: Diagnosis not present

## 2023-11-09 ENCOUNTER — Ambulatory Visit (INDEPENDENT_AMBULATORY_CARE_PROVIDER_SITE_OTHER): Admitting: Pulmonary Disease

## 2023-11-09 ENCOUNTER — Encounter: Payer: Self-pay | Admitting: Pulmonary Disease

## 2023-11-09 VITALS — BP 136/77 | HR 99 | Temp 98.1°F | Ht 62.0 in | Wt 111.0 lb

## 2023-11-09 DIAGNOSIS — J471 Bronchiectasis with (acute) exacerbation: Secondary | ICD-10-CM

## 2023-11-09 DIAGNOSIS — J479 Bronchiectasis, uncomplicated: Secondary | ICD-10-CM

## 2023-11-09 NOTE — Progress Notes (Signed)
 Julie Silva    [id number]    1927/03/03  Primary Care Physician:Jones, Debby CROME, MD  Referring Physician: Joshua Debby CROME, MD [address] [place],  [place] [address]  Chief complaint: Follow up for abnormal CT scan.  HPI: Julie Silva was previously followed in the pulmonary clinic for abnormal CT with bronchiectatic, tree-in-bud changes concerning for MAI.  She was last seen in 2018 At that time she was also on chronic nitrofurantoin  for UTI for many years for chronic UTI  Recommendations were for conservative management as she was asymptomatic and not producing sputum She was previously on hospice but came off as she gained some weight and is feeling stronger. Bronchoscopy or treatment for MAI was not recommended due to age, frailty, malnutrition with weight loss Suspicion for nitrofurantoin  toxicity was low but she was taken off this medication and placed on Bactrim  for UTI prophylaxis. She denies any signs and symptoms of connective tissue disease, autoimmune disease.  She is a lifelong non-smoker.  Interim History:  Discussed the use of AI scribe software for clinical note transcription with the patient, who gave verbal consent to proceed.  History of Present Illness Julie Silva is a 89 year old female with a history of diverticulitis and mycobacterial infection who presents for follow-up after hospitalization.  Pulmonary symptoms - Chronic cough with difficulty expectorating phlegm - Sensation of retained secretions in the airway - Occasional choking sensations - No recent upper respiratory infections  Imaging findings - August 2025 CT scan during hospitalization for diverticulitis showed increased consolidation and possible cavitation in the right lower lung.  Reviewed hospital records and imaging in detail.  Airway clearance and supportive therapies - Relief of symptoms with guaifenesin  three times daily - Breathing treatments with 3% saline nebulizers  twice daily - Use of flutter valve and saline nebulizers for airway clearance - Chest physiotherapy considered too aggressive  Nutritional status and weight changes - Weight increased from a previous low of eighty pounds  Advance directives - Do Not Resuscitate (DNR) order in place   Current Outpatient Medications on File Prior to Visit  Medication Sig Dispense Refill   acetaminophen  (TYLENOL ) 500 MG tablet Take 1,000 mg by mouth See admin instructions. Take 1,000 mg by mouth at 6 AM and 8 PM- may take an additional 1,000 mg once a day as needed for pain     apixaban  (ELIQUIS ) 2.5 MG TABS tablet Take 1 tablet (2.5 mg total) by mouth 2 (two) times daily. 180 tablet 1   bimatoprost (LUMIGAN) 0.03 % ophthalmic solution Place 1 drop into both eyes at bedtime.     Catheters MISC Replace catheter twice daily. 180 each 1   cefdinir  (OMNICEF ) 250 MG/5ML suspension Take 5 mLs (250 mg total) by mouth daily. (Patient taking differently: Take 250 mg by mouth at bedtime.) 450 mL 1   dorzolamide -timolol  (COSOPT ) 22.3-6.8 MG/ML ophthalmic solution Place 1 drop into both eyes 2 (two) times daily.     estradiol (ESTRACE) 0.1 MG/GM vaginal cream Place 0.5 g vaginally 3 (three) times a week.     gabapentin  (NEURONTIN ) 300 MG capsule Take 1 capsule (300 mg total) by mouth 2 (two) times daily. (Patient taking differently: Take 300 mg by mouth See admin instructions. Mix 300 mg into applesauce and take by mouth at 6 AM and 8 PM) 180 capsule 3   guaifenesin  (ROBITUSSIN) 100 MG/5ML syrup Take 200 mg by mouth 3 (three) times daily.  latanoprost  (XALATAN ) 0.005 % ophthalmic solution Place 1 drop into both eyes at bedtime.     omeprazole  (PRILOSEC) 20 MG capsule TAKE 1 CAPSULE BY MOUTH EVERY DAY 90 capsule 1   OMEPRAZOLE  PO Take 20 mg by mouth See admin instructions. Dissolve 20 mg and place into applesauce- take by mouth at 6 AM     ondansetron  (ZOFRAN -ODT) 4 MG disintegrating tablet Take 1 tablet (4 mg total) by  mouth every 8 (eight) hours as needed for nausea or vomiting. (Patient taking differently: Take 2 mg by mouth every 8 (eight) hours as needed for nausea or vomiting.) 30 tablet 0   prochlorperazine  (COMPAZINE ) 10 MG tablet Take 2.5 mg by mouth every 4 (four) hours as needed for nausea or vomiting.     sodium chloride  HYPERTONIC 3 % nebulizer solution INHALE 1 VIAL VIA NEBULIZER ONCE A DAY 750 mL 3   SYSTANE 0.4-0.3 % GEL ophthalmic gel Place 1 Application into both eyes 3 (three) times daily as needed (for irritation and dryness).     traMADol  (ULTRAM ) 50 MG tablet Take 25 mg by mouth every 6 (six) hours as needed (for kidney stone pain).     Wheat Dextrin (BENEFIBER PO) Take 4 g by mouth See admin instructions. Mix 4 grams (1 teaspoonful) into a glass of milk and drink by mouth 2 times a day     Multiple Vitamins-Minerals (PRESERVISION AREDS 2) CHEW Chew 1 tablet by mouth 2 (two) times daily. (Patient not taking: Reported on 11/09/2023)     No current facility-administered medications on file prior to visit.    Vitals:   11/09/23 1546  BP: 136/77  Pulse: 99  Temp: 98.1 F (36.7 C)  Height: 5' 2 (1.575 m)  Weight: 111 lb (50.3 kg)  SpO2: 97%  TempSrc: Oral  BMI (Calculated): 20.3     Physical Exam GEN: No acute distress CV: Regular rate and rhythm no murmurs LUNGS: Crackles present, otherwise clear to auscultation bilaterally with normal respiratory effort SKIN JOINTS: Warm and dry no rash    Data Reviewed: CT scan  05/01/15- diffuse emphysematous changes, bilateral consolidative nodular opacities in the lower lobes, right middle lobe bronchiectasis, tree-in-bud opacity.   CT scan 07/31/15-  similar to above. Some consolidative changes are worse when others show improvement. Images personally reviewed.   CT chest 03/19/2020-moderate bilateral bronchiectasis unchanged from before, persistent tree-in-bud with cavitary 2.1 cm right lower lobe nodule and a new 1.3 cm nodular consolidation  in the right upper lobe.  High-resolution CT 09/25/2020-bronchiectasis, fibrosis in the right middle lobe, enlargement of right lower lobe cavitary lesion with new Lesion in the left lower lobe.  High resolution CT 06/10/2021-extensive consolidation, nodularity, cavitary lesions in the lower lobes  CT chest 12/15/2021-extensive centrilobular nodularity, tree-in-bud with bronchiectasis.  CT abdomen pelvis 08/31/2023-worsening right lung consolidation with nodular patchy opacities with right lower lobe cavitation I have reviewed the images personally. Assessment & Plan Bronchiectasis, concern for MAI She has predominantly right middle lobe bronchiectasis, tree-in-bud opacities and consolidative changes in the bases. This is suggestive of MAI infection. She is using a flutter valve but she is unable to bring up secretions. She cannot tolerate percussion vest as it causes dizziness She has been off nitrofurantoin  since 2018 and is not an ongoing issue.  Recent CT scan shows increased consolidation and cavitation. Given her age and frailty, invasive procedures like bronchoscopy are not recommended. Treatment for MAI is challenging at her age, and aggressive interventions are not advised due  to potential impact on quality of life. The infection is slowly worsening over time, but it may not significantly affect her lifespan. Focus is on comfort and managing symptoms without aggressive interventions.  - Continue guaifenesin  three times a day. - Continue breathing treatments with 3% saline twice daily. - Will order CT scan in six months to monitor progression. - Will schedule follow-up appointment in six months.  Plan/Recommendations: - Continue guaifenesin  - Continue flutter valve, saline nebs - CT scan in 6 months  Julie Coder MD [facility] Pager [phone] If no answer or after 3pm call: [phone] 11/09/2023, 3:59 PM  CC: Joshua Debby CROME, MD

## 2023-11-09 NOTE — Progress Notes (Signed)
 Julie Silva    [id number]    1927/06/23  Primary Care Physician:Jones, Debby CROME, MD  Referring Physician: Joshua Debby CROME, MD [address] [place],  [place] [address]  Chief complaint: Follow up for abnormal CT scan.  HPI: Julie Silva was previously followed in the pulmonary clinic for abnormal CT with bronchiectatic, tree-in-bud changes concerning for MAI.  She was last seen in 2018 At that time she was also on chronic nitrofurantoin  for UTI for many years for chronic UTI  Recommendations were for conservative management as she was asymptomatic and not producing sputum Bronchoscopy or treatment for MAI was not recommended due to age, frailty, malnutrition with weight loss Suspicion for nitrofurantoin  toxicity was low but she was taken off this medication and placed on Bactrim  for UTI prophylaxis. She denies any signs and symptoms of connective tissue disease, autoimmune disease.  She is a lifelong non-smoker.  Interim History:  She has been dealing with chronic urinary tract infections with recurrent flareups on chronic antibiotics with cefdinir  And imaging continues to show persistent lower lobe nodularity, cavitary lesion Complains of increasing cough and congestion for the past few weeks.  Denies any fevers and chills Continues on 3% saline.  Stopped Xopenex  as it is causing tremors.  She was previously on hospice but came off as she gained some weight and is feeling stronger.  Current Outpatient Medications on File Prior to Visit  Medication Sig Dispense Refill   acetaminophen  (TYLENOL ) 500 MG tablet Take 1,000 mg by mouth See admin instructions. Take 1,000 mg by mouth at 6 AM and 8 PM- may take an additional 1,000 mg once a day as needed for pain     apixaban  (ELIQUIS ) 2.5 MG TABS tablet Take 1 tablet (2.5 mg total) by mouth 2 (two) times daily. 180 tablet 1   bimatoprost (LUMIGAN) 0.03 % ophthalmic solution Place 1 drop into both eyes at bedtime.     Catheters  MISC Replace catheter twice daily. 180 each 1   cefdinir  (OMNICEF ) 250 MG/5ML suspension Take 5 mLs (250 mg total) by mouth daily. (Patient taking differently: Take 250 mg by mouth at bedtime.) 450 mL 1   dorzolamide -timolol  (COSOPT ) 22.3-6.8 MG/ML ophthalmic solution Place 1 drop into both eyes 2 (two) times daily.     estradiol (ESTRACE) 0.1 MG/GM vaginal cream Place 0.5 g vaginally 3 (three) times a week.     gabapentin  (NEURONTIN ) 300 MG capsule Take 1 capsule (300 mg total) by mouth 2 (two) times daily. (Patient taking differently: Take 300 mg by mouth See admin instructions. Mix 300 mg into applesauce and take by mouth at 6 AM and 8 PM) 180 capsule 3   guaifenesin  (ROBITUSSIN) 100 MG/5ML syrup Take 200 mg by mouth 3 (three) times daily.     latanoprost  (XALATAN ) 0.005 % ophthalmic solution Place 1 drop into both eyes at bedtime.     omeprazole  (PRILOSEC) 20 MG capsule TAKE 1 CAPSULE BY MOUTH EVERY DAY 90 capsule 1   OMEPRAZOLE  PO Take 20 mg by mouth See admin instructions. Dissolve 20 mg and place into applesauce- take by mouth at 6 AM     ondansetron  (ZOFRAN -ODT) 4 MG disintegrating tablet Take 1 tablet (4 mg total) by mouth every 8 (eight) hours as needed for nausea or vomiting. (Patient taking differently: Take 2 mg by mouth every 8 (eight) hours as needed for nausea or vomiting.) 30 tablet 0   prochlorperazine  (COMPAZINE ) 10 MG tablet Take  2.5 mg by mouth every 4 (four) hours as needed for nausea or vomiting.     sodium chloride  HYPERTONIC 3 % nebulizer solution INHALE 1 VIAL VIA NEBULIZER ONCE A DAY 750 mL 3   SYSTANE 0.4-0.3 % GEL ophthalmic gel Place 1 Application into both eyes 3 (three) times daily as needed (for irritation and dryness).     traMADol  (ULTRAM ) 50 MG tablet Take 25 mg by mouth every 6 (six) hours as needed (for kidney stone pain).     Wheat Dextrin (BENEFIBER PO) Take 4 g by mouth See admin instructions. Mix 4 grams (1 teaspoonful) into a glass of milk and drink by mouth 2  times a day     Multiple Vitamins-Minerals (PRESERVISION AREDS 2) CHEW Chew 1 tablet by mouth 2 (two) times daily. (Patient not taking: Reported on 11/09/2023)     No current facility-administered medications on file prior to visit.    Physical Exam: Blood pressure 130/62, pulse 79, temperature 97.7 F (36.5 C), temperature source Oral, height 5' 4 (1.626 m), weight 120 lb (54.4 kg), SpO2 97 %. Gen:      No acute distress HEENT:  EOMI, sclera anicteric Neck:     No masses; no thyromegaly Lungs:   Right basilar crackle CV:         Regular rate and rhythm; no murmurs Abd:      + bowel sounds; soft, non-tender; no palpable masses, no distension Ext:    No edema; adequate peripheral perfusion Skin:      Warm and dry; no rash Neuro: alert and oriented x 3 Psych: normal mood and affect   Data Reviewed: CT scan  05/01/15- diffuse emphysematous changes, bilateral consolidative nodular opacities in the lower lobes, right middle lobe bronchiectasis, tree-in-bud opacity.   CT scan 07/31/15-  similar to above. Some consolidative changes are worse when others show improvement. Images personally reviewed.   CT chest 03/19/2020-moderate bilateral bronchiectasis unchanged from before, persistent tree-in-bud with cavitary 2.1 cm right lower lobe nodule and a new 1.3 cm nodular consolidation in the right upper lobe.  High-resolution CT 09/25/2020-bronchiectasis, fibrosis in the right middle lobe, enlargement of right lower lobe cavitary lesion with new Lesion in the left lower lobe.  High resolution CT 06/10/2021-extensive consolidation, nodularity, cavitary lesions in the lower lobes  CT chest 12/15/2021-extensive centrilobular nodularity, tree-in-bud with bronchiectasis. I have reviewed the images personally.  Assessment:  Bronchiectasis, concern for MAI Acute exacerbation She has predominantly right middle lobe bronchiectasis, tree-in-bud opacities and consolidative changes in the bases. This is  suggestive of MAI infection. She is using a flutter valve but she is unable to bring up secretions.  Continue flutter valve, Mucinex .  She cannot tolerate percussion vest as it causes dizziness Check sputum cultures for AFB and regular cultures Prescribe Augmentin  and prednisone  for 5 days for mild flare of bronchiectasis  She has been off nitrofurantoin  since 2018 and is not an ongoing issue.  Plan/Recommendations: - Augmentin , prednisone  - Continue flutter valve, saline nebs  Lonna Coder MD [facility] Pager [phone] If no answer or after 3pm call: [phone] 11/09/2023, 4:00 PM  CC: Joshua Debby CROME, MD

## 2023-11-09 NOTE — Patient Instructions (Signed)
  VISIT SUMMARY: You had a follow-up visit after your recent hospitalization. We discussed your chronic cough and lung condition, and reviewed your current treatments and imaging results.  YOUR PLAN: CHRONIC PULMONARY MYCOBACTERIAL INFECTION WITH CHRONIC PRODUCTIVE COUGH: You have a chronic lung infection that is causing a persistent cough and affecting your right lower lung. The infection is slowly worsening, but aggressive treatments are not recommended due to your age and overall health. -Continue taking guaifenesin  three times a day to help with your cough. -Continue using 3% saline nebulizers twice daily for breathing treatments.  Use flutter valve 2-3 times daily -We will order a CT scan in six months to monitor the progression of your lung condition. -Schedule a follow-up appointment in six months.

## 2023-12-07 DIAGNOSIS — H02055 Trichiasis without entropian left lower eyelid: Secondary | ICD-10-CM | POA: Diagnosis not present

## 2023-12-07 DIAGNOSIS — H353132 Nonexudative age-related macular degeneration, bilateral, intermediate dry stage: Secondary | ICD-10-CM | POA: Diagnosis not present

## 2023-12-07 DIAGNOSIS — H401132 Primary open-angle glaucoma, bilateral, moderate stage: Secondary | ICD-10-CM | POA: Diagnosis not present

## 2023-12-24 ENCOUNTER — Other Ambulatory Visit: Payer: Self-pay | Admitting: Emergency Medicine

## 2023-12-24 DIAGNOSIS — D6869 Other thrombophilia: Secondary | ICD-10-CM

## 2023-12-24 DIAGNOSIS — I480 Paroxysmal atrial fibrillation: Secondary | ICD-10-CM

## 2023-12-25 ENCOUNTER — Telehealth: Payer: Self-pay | Admitting: Internal Medicine

## 2023-12-25 ENCOUNTER — Ambulatory Visit (HOSPITAL_COMMUNITY)
Admission: RE | Admit: 2023-12-25 | Discharge: 2023-12-25 | Disposition: A | Discharge status: Home / Self Care | Source: Ambulatory Visit | Attending: Physician Assistant | Admitting: Physician Assistant

## 2023-12-25 ENCOUNTER — Ambulatory Visit: Admitting: Physician Assistant

## 2023-12-25 ENCOUNTER — Encounter: Payer: Self-pay | Admitting: Physician Assistant

## 2023-12-25 ENCOUNTER — Other Ambulatory Visit

## 2023-12-25 VITALS — BP 124/66 | HR 96 | Ht 62.0 in | Wt 109.0 lb

## 2023-12-25 DIAGNOSIS — R1032 Left lower quadrant pain: Secondary | ICD-10-CM

## 2023-12-25 DIAGNOSIS — R1031 Right lower quadrant pain: Secondary | ICD-10-CM

## 2023-12-25 DIAGNOSIS — K5732 Diverticulitis of large intestine without perforation or abscess without bleeding: Secondary | ICD-10-CM | POA: Diagnosis not present

## 2023-12-25 DIAGNOSIS — R509 Fever, unspecified: Secondary | ICD-10-CM

## 2023-12-25 LAB — BASIC METABOLIC PANEL WITH GFR
BUN: 26 mg/dL — ABNORMAL HIGH (ref 6–23)
CO2: 25 meq/L (ref 19–32)
Calcium: 9.3 mg/dL (ref 8.4–10.5)
Chloride: 103 meq/L (ref 96–112)
Creatinine, Ser: 0.92 mg/dL (ref 0.40–1.20)
GFR: 53.29 mL/min — ABNORMAL LOW
Glucose, Bld: 104 mg/dL — ABNORMAL HIGH (ref 70–99)
Potassium: 3.9 meq/L (ref 3.5–5.1)
Sodium: 138 meq/L (ref 135–145)

## 2023-12-25 LAB — CBC WITH DIFFERENTIAL/PLATELET
Basophils Absolute: 0 K/uL (ref 0.0–0.1)
Basophils Relative: 0.3 % (ref 0.0–3.0)
Eosinophils Absolute: 0.2 K/uL (ref 0.0–0.7)
Eosinophils Relative: 2.8 % (ref 0.0–5.0)
HCT: 31.8 % — ABNORMAL LOW (ref 36.0–46.0)
Hemoglobin: 10.4 g/dL — ABNORMAL LOW (ref 12.0–15.0)
Lymphocytes Relative: 20.8 % (ref 12.0–46.0)
Lymphs Abs: 1.5 K/uL (ref 0.7–4.0)
MCHC: 32.7 g/dL (ref 30.0–36.0)
MCV: 86.4 fl (ref 78.0–100.0)
Monocytes Absolute: 0.8 K/uL (ref 0.1–1.0)
Monocytes Relative: 12 % (ref 3.0–12.0)
Neutro Abs: 4.5 K/uL (ref 1.4–7.7)
Neutrophils Relative %: 64.1 % (ref 43.0–77.0)
Platelets: 332 K/uL (ref 150.0–400.0)
RBC: 3.68 Mil/uL — ABNORMAL LOW (ref 3.87–5.11)
RDW: 15.3 % (ref 11.5–15.5)
WBC: 7.1 K/uL (ref 4.0–10.5)

## 2023-12-25 MED ORDER — AMOXICILLIN-POT CLAVULANATE 600-42.9 MG/5ML PO SUSR: 600.0000 mg | Freq: Two times a day (BID) | ORAL | 1 refills | Status: AC

## 2023-12-25 MED ORDER — IOHEXOL 9 MG/ML PO SOLN
1000.0000 mL | ORAL | Status: AC
  Administered 2023-12-25: 1000 mL via ORAL

## 2023-12-25 MED ORDER — IOHEXOL 300 MG/ML  SOLN
75.0000 mL | Freq: Once | INTRAMUSCULAR | Status: AC | PRN
  Administered 2023-12-25: 75 mL via INTRAVENOUS

## 2023-12-25 NOTE — Telephone Encounter (Signed)
 Pts daughter feels her mother might be having a diverticulitis flare. She is having pain in her left side and did have a fever last evening. Requesting help to avoid having to be seen in the ER. Pt scheduled to see [name] PA today at 1:30pm. Daughter aware of appt.

## 2023-12-25 NOTE — Telephone Encounter (Signed)
 Inbound from patient daughter stating that she would like to speak to the nurse in regards to her mother might be having another Diverticulitis flare up. Patient daughter stated she has had a fever of 101 her abdomin is tender and is feeling soreness when in a sitting position. Daughter is trying to avoid going to the ER. Good call back number would be [phone]. Please advise.

## 2023-12-25 NOTE — Progress Notes (Signed)
 "     Julie Console, PA-C [address] [place], [place]  [address] Phone: [phone]   Primary Care Physician: Joshua Debby CROME, MD  Primary Gastroenterologist:  Julie Console, PA-C / Dr. Gordy Starch   Chief Complaint:  Diverticulitis       HPI:   Discussed the use of AI scribe software for clinical note transcription with the patient, who gave verbal consent to proceed.  89 year old female, established patient Dr. Starch, presents for possible recurrent diverticulitis.  She is here today with her daughter who helps with her care.  She was hospitalized for uncomplicated sigmoid diverticulitis 08/31/2023 until 09/04/2023 treated with Zosyn  and Augmentin .  Also treated for diverticulitis in hospital 02/2023.   PMH of history of diverticulitis, PAF on Eliquis , chronic anemia, bronchiectasis, pseudogout, CAD, recurrent UTI on chronic cefdinir  for prophylaxis, history of vaginal mesh erosion with fistula, pulmonary MAC infection (followed by Dr. Theophilus pulmonologist).  She has baseline shortness of breath with activity. Has poor appetite, was on hospice in the past when her weight was around 80 lb now 109 lb and graduated from hospice and on boost ~ 2 a day.  History of Present Illness Lower abdominal pain and diverticulitis - She is having Recurrent lower abdominal pain and fever for 1 week. - Persistent, diffuse lower abdominal pain present almost constantly, with intermittent worsening over the past week - Pain is similar to previous diverticulitis episodes and exacerbated by transitions such as sitting and standing - Associated with abdominal tenderness and a sensation of everything is pushing up in her - Maintains a nightly regimen of cefdinir  5 mL for prophylaxis of UTI due to intermittent in and out catheterization - Caregiver notes cefdinir  may mask symptoms until she becomes severe, often resulting in fever and need for emergency care - No nausea or vomiting - Difficulty  swallowing pills and prefers liquid medications  Fever - Low grade fever for several days, with a maximum temperature of 101F on Saturday night - Fever typically occurs in the evening  Back pain - Back pain present this morning, associated with feeling pretty rotten  Pulmonary symptoms - History of pulmonary mycobacterial infection - Uses a nebulizer for respiratory support - Occasional shortness of breath, which improves when sitting up  Cardiac symptoms - Intermittent atrial fibrillation monitored with a watch ECG - Occasional sensation of funny in her chest and feeling a little shaky     Current Outpatient Medications  Medication Sig Dispense Refill   acetaminophen  (TYLENOL ) 500 MG tablet Take 1,000 mg by mouth See admin instructions. Take 1,000 mg by mouth at 6 AM and 8 PM- may take an additional 1,000 mg once a day as needed for pain     amoxicillin -clavulanate (AUGMENTIN ) 600-42.9 MG/5ML suspension Take 5 mLs (600 mg total) by mouth 2 (two) times daily. 100 mL 1   apixaban  (ELIQUIS ) 2.5 MG TABS tablet Take 1 tablet (2.5 mg total) by mouth 2 (two) times daily. 180 tablet 1   Catheters MISC Replace catheter twice daily. 180 each 1   cefdinir  (OMNICEF ) 250 MG/5ML suspension Take 5 mLs (250 mg total) by mouth daily. (Patient taking differently: Take 250 mg by mouth at bedtime.) 450 mL 1   dorzolamide -timolol  (COSOPT ) 22.3-6.8 MG/ML ophthalmic solution Place 1 drop into both eyes 2 (two) times daily.     estradiol (ESTRACE) 0.1 MG/GM vaginal cream Place 0.5 g vaginally 3 (three) times a week.     gabapentin  (NEURONTIN ) 300 MG capsule Take 1  capsule (300 mg total) by mouth 2 (two) times daily. (Patient taking differently: Take 300 mg by mouth See admin instructions. Mix 300 mg into applesauce and take by mouth at 6 AM and 8 PM) 180 capsule 3   guaifenesin  (ROBITUSSIN) 100 MG/5ML syrup Take 200 mg by mouth 3 (three) times daily.     latanoprost  (XALATAN ) 0.005 % ophthalmic solution  Place 1 drop into both eyes at bedtime.     Multiple Vitamins-Minerals (PRESERVISION AREDS 2) CHEW Chew 1 tablet by mouth 2 (two) times daily.     omeprazole  (PRILOSEC) 20 MG capsule TAKE 1 CAPSULE BY MOUTH EVERY DAY 90 capsule 1   OMEPRAZOLE  PO Take 20 mg by mouth See admin instructions. Dissolve 20 mg and place into applesauce- take by mouth at 6 AM     ondansetron  (ZOFRAN -ODT) 4 MG disintegrating tablet Take 1 tablet (4 mg total) by mouth every 8 (eight) hours as needed for nausea or vomiting. (Patient taking differently: Take 2 mg by mouth every 8 (eight) hours as needed for nausea or vomiting.) 30 tablet 0   prochlorperazine  (COMPAZINE ) 10 MG tablet Take 2.5 mg by mouth every 4 (four) hours as needed for nausea or vomiting.     sodium chloride  HYPERTONIC 3 % nebulizer solution INHALE 1 VIAL VIA NEBULIZER ONCE A DAY 750 mL 3   SYSTANE 0.4-0.3 % GEL ophthalmic gel Place 1 Application into both eyes 3 (three) times daily as needed (for irritation and dryness).     traMADol  (ULTRAM ) 50 MG tablet Take 25 mg by mouth every 6 (six) hours as needed (for kidney stone pain).     No current facility-administered medications for this visit.    Allergies as of 12/25/2023 - Review Complete 12/25/2023  Allergen Reaction Noted   Codeine Other (See Comments) 02/02/2013   Colchicine  Diarrhea 07/30/2020   Tape Other (See Comments) 08/31/2023    Past Medical History:  Diagnosis Date   Anemia    Aortic atherosclerosis    Arthritis    Osteoporosis, neck stiffness   Atrial fibrillation (HCC)    Bleeds easily    father was a hemophiliac - pt not being bothered in recent years.   CAD (coronary artery disease)    Calcium deposit in bursa of knee 2017   Cancer (HCC)    skin cancer basal cell.   Chicken pox    Cyst, Baker's knee    left knee- tx. Cortisone injection 05-05-15 in office- improved pain relief and swelling left ankle and knee   Diverticulitis    Diverticulosis    Dysrhythmia    Dr.  McAlhany-cardiology   GERD (gastroesophageal reflux disease)    Glaucoma of both eyes    Gout    Hearing loss of both ears    Bilateral ears- hearing aids used- right ear hearing betther than left.   Hiatal hernia    History of blood transfusion 1957   lots; most related to my periods-last 1968 -s/p Hysterectomy   Migraines    years ago (02/04/2013)   Motion sickness    back seat - cars, sudden movements   PAF (paroxysmal atrial fibrillation) (HCC)    Pancreatic cyst    Pelvic floor dysfunction    PONV (postoperative nausea and vomiting)    Risk for falls    wobbley per daughter   Self-catheterizes urinary bladder    daily- retained urine and chronic UTI   SVT (supraventricular tachycardia)    Dr. KANDICE. Taylor-follows loop recorder left chest  Swallowing difficulty    freq occ. mostley liquids   Urine incontinence    UTI (urinary tract infection)    Chronic Urinary tract infections- tx Macrobid  daily.   Vertigo    Wears hearing aid in both ears     Past Surgical History:  Procedure Laterality Date   APPENDECTOMY  01/03/1946   BLADDER SURGERY  01/03/1993   Repair-    BREAST BIOPSY Bilateral    both were fine   CATARACT EXTRACTION W/ INTRAOCULAR LENS IMPLANT Left    CATARACT EXTRACTION W/PHACO Right 10/16/2018   Procedure: CATARACT EXTRACTION PHACO AND INTRAOCULAR LENS PLACEMENT (IOC)  RIGHT MiLoop diabetes 01:05.8  21.0%  13.77;  Surgeon: Jaye Fallow, MD;  Location: [facility];  Service: Ophthalmology;  Laterality: Right;  BS tends to drop in AM and would like early surgery so she can get home to eat, please   CHOLECYSTECTOMY  01/04/2012   COMBINED HYSTERECTOMY ABDOMINAL W/ A&P REPAIR / OOPHORECTOMY     DILATION AND CURETTAGE OF UTERUS     EP IMPLANTABLE DEVICE N/A 06/19/2014   Procedure: Loop Recorder Insertion;  Surgeon: Danelle LELON Birmingham, MD;  Location: [facility];  Service: Cardiovascular;  Laterality: N/A;   EXCISION OF BREAST BIOPSY  Right 05/13/2015   Procedure: RIGHT BREAST EXCISIONAL BIOPSY x2;  Surgeon: Krystal Spinner, MD;  Location: [facility];  Service: General;  Laterality: Right;   PACEMAKER IMPLANT  02/2020   TONSILLECTOMY  01/04/1944   TOTAL ABDOMINAL HYSTERECTOMY     VAGINAL HYSTERECTOMY  01/03/1966   vaginal mesh      Review of Systems:    All systems reviewed and negative except where noted in HPI.    Physical Exam:  BP 124/66   Pulse 96   Ht 5' 2 (1.575 m)   Wt 109 lb (49.4 kg)   BMI 19.94 kg/m  No LMP recorded. Patient has had a hysterectomy.  General: Thin, feeble, elderly, frail female, mildly ill-appearing.  She is able to get on and off exam table with my help. Lungs: Clear to auscultation bilaterally. Non-labored. Heart: Regular rate and rhythm, no murmurs rubs or gallops.  Abdomen: Bowel sounds are normal; Abdomen is Soft; No hepatosplenomegaly, masses or hernias; moderate diffuse bilateral lower abdominal tenderness in the RLQ and LLQ.  Some suprapubic tenderness.  There is no upper abdominal tenderness.  Abdominal Tenderness; No guarding or rebound tenderness. Neuro: Alert and oriented x 3.  Grossly intact.  Psych: Alert and cooperative, normal mood and affect.   Imaging Studies:  08/31/2023 CT abdomen pelvis with contrast 1. Interval worsening of right lung consolidation and nodular patchy airspace opacities with associated interval development of a couple of right lower lobe cavitations measuring at least 1.4 cm. Finding may represent infection versus malignancy. Recommend CT chest with intravenous contrast for further evaluation. 2. Colonic diverticulosis with uncomplicated distal sigmoid diverticulitis. Recommend colonoscopy status post treatment and status post complete resolution of inflammatory changes to exclude an underlying lesion. 3. Redemonstration of stable gas and fluid collection measuring 1.5 x 1.6 x 10.5 cm extending from the vaginal pexy in the setting of  a hysterectomy up to the presacral space at the S1 level. 4. Hiatal hernia. 5.  Aortic Atherosclerosis   Labs: CBC    Component Value Date/Time   WBC 7.1 12/25/2023 1419   RBC 3.68 (L) 12/25/2023 1419   HGB 10.4 (L) 12/25/2023 1419   HGB 12.4 02/23/2018 1525   HCT 31.8 (L) 12/25/2023 1419  HCT 36.2 02/23/2018 1525   PLT 332.0 12/25/2023 1419   PLT 239 02/23/2018 1525   MCV 86.4 12/25/2023 1419   MCV 93 02/23/2018 1525   MCV 93 07/03/2012 1533   MCH 28.3 09/02/2023 0715   MCHC 32.7 12/25/2023 1419   RDW 15.3 12/25/2023 1419   RDW 12.4 02/23/2018 1525   RDW 13.1 07/03/2012 1533   LYMPHSABS 1.5 12/25/2023 1419   LYMPHSABS 1.7 02/23/2018 1525   LYMPHSABS 1.2 07/03/2012 1533   MONOABS 0.8 12/25/2023 1419   MONOABS 0.6 07/03/2012 1533   EOSABS 0.2 12/25/2023 1419   EOSABS 0.1 02/23/2018 1525   EOSABS 0.1 07/03/2012 1533   BASOSABS 0.0 12/25/2023 1419   BASOSABS 0.0 02/23/2018 1525   BASOSABS 0.1 07/03/2012 1533    CMP     Component Value Date/Time   NA 138 12/25/2023 1419   NA 142 02/23/2018 1525   K 3.9 12/25/2023 1419   CL 103 12/25/2023 1419   CO2 25 12/25/2023 1419   GLUCOSE 104 (H) 12/25/2023 1419   BUN 26 (H) 12/25/2023 1419   BUN 17 02/23/2018 1525   CREATININE 0.92 12/25/2023 1419   CALCIUM 9.3 12/25/2023 1419   PROT 7.2 09/01/2023 0805   ALBUMIN 3.3 (L) 09/01/2023 0805   AST 18 09/01/2023 0805   ALT 5 09/01/2023 0805   ALKPHOS 101 09/01/2023 0805   BILITOT 0.5 09/01/2023 0805   GFRNONAA 56 (L) 09/02/2023 0715   GFRAA 70 02/23/2018 1525     Assessment and Plan:   Julie Silva is a 89 y.o. y/o female presents for recurrent abdominal pain concerning for diverticulitis.  1.  Severe worsening bilateral lower abdominal pain for 1 week:  possible recurrent diverticulitis.  Also has history of recurrent UTIs and self catheterization. - Stat labs CBC, BMP - Stat CT abdomen pelvis with contrast  - Rx Augmentin  amoxicillin -clavulanate (AUGMENTIN )  600-42.9 MG/5ML suspension; take 5 mL (600 mg) twice a daily for 10 days. - ED precautions discussed: I advised patient's daughter to take her to the emergency department if symptoms worsen or if she is unable to tolerate oral medication..  She expressed understanding.   Julie Console, PA-C  Follow up in 4 weeks with TG.  Follow-up sooner if symptoms worsen.  Also follow-up based on test results and symptoms.   "

## 2023-12-25 NOTE — Patient Instructions (Signed)
 Your provider has requested that you go to the basement level for lab work before leaving today. Press B on the elevator. The lab is located at the first door on the left as you exit the elevator.  We have sent the following medications to your pharmacy for you to pick up at your convenience: Augmentin  5mL twice daily  You have been scheduled for a CT scan of the abdomen and pelvis at [facility], 1st floor Radiology. You are scheduled on 12/24/24 at 2:30 pm. You should arrive 15 minutes prior to your appointment time for registration. You may take any medications as prescribed with a small amount of water, if necessary. If you take any of the following medications: METFORMIN, GLUCOPHAGE, GLUCOVANCE, AVANDAMET, RIOMET, FORTAMET, ACTOPLUS MET, JANUMET, GLUMETZA or METAGLIP, you MAY be asked to HOLD this medication 48 hours AFTER the exam.   The purpose of you drinking the oral contrast is to aid in the visualization of your intestinal tract. The contrast solution may cause some diarrhea. Depending on your individual set of symptoms, you may also receive an intravenous injection of x-ray contrast/dye. Plan on being at [facility] for 45 minutes or longer, depending on the type of exam you are having performed. If you have any questions regarding your exam or if you need to reschedule, you may call [facility] at [phone] between the hours of 8:00 am and 5:00 pm, Monday-Friday.   Please follow up sooner if symptoms increase or worsen  Due to recent changes in healthcare laws, you may see the results of your imaging and laboratory studies on MyChart before your provider has had a chance to review them.  We understand that in some cases there may be results that are confusing or concerning to you. Not all laboratory results come back in the same time frame and the provider may be waiting for multiple results in order to interpret others.  Please give us  48 hours in order for your  provider to thoroughly review all the results before contacting the office for clarification of your results.   Thank you for trusting me with your gastrointestinal care!   [name], PA-C _______________________________________________________  If your blood pressure at your visit was 140/90 or greater, please contact your primary care physician to follow up on this.  _______________________________________________________  If you are age 82 or older, your body mass index should be between 23-30. Your Body mass index is 19.94 kg/m. If this is out of the aforementioned range listed, please consider follow up with your Primary Care Provider.  If you are age 36 or younger, your body mass index should be between 19-25. Your Body mass index is 19.94 kg/m. If this is out of the aformentioned range listed, please consider follow up with your Primary Care Provider.   ________________________________________________________  The [facility] providers would like to encourage you to use MYCHART to communicate with providers for non-urgent requests or questions.  Due to long hold times on the telephone, sending your provider a message by [facility] may be a faster and more efficient way to get a response.  Please allow 48 business hours for a response.  Please remember that this is for non-urgent requests.  _______________________________________________________

## 2023-12-26 ENCOUNTER — Ambulatory Visit: Payer: Self-pay | Admitting: Physician Assistant

## 2024-01-10 ENCOUNTER — Other Ambulatory Visit: Payer: Self-pay | Admitting: Emergency Medicine

## 2024-01-10 DIAGNOSIS — D6869 Other thrombophilia: Secondary | ICD-10-CM

## 2024-01-10 DIAGNOSIS — I480 Paroxysmal atrial fibrillation: Secondary | ICD-10-CM

## 2024-01-24 ENCOUNTER — Telehealth: Payer: Self-pay | Admitting: Physician Assistant

## 2024-01-24 ENCOUNTER — Other Ambulatory Visit: Payer: Self-pay

## 2024-01-24 MED ORDER — AMOXICILLIN-POT CLAVULANATE 600-42.9 MG/5ML PO SUSR: 600.0000 mg | Freq: Three times a day (TID) | ORAL | 0 refills | Status: AC

## 2024-01-24 NOTE — Telephone Encounter (Signed)
 Spoke with pts daughter and she is aware or recommendations, script sent to pharmacy.

## 2024-01-24 NOTE — Telephone Encounter (Signed)
 Pts daughter states her mother had diverticulitis around Christmas and had ct done and treatment. She has started having the same symptoms about 3-4 days ago but symptoms have gotten more intense the last 24 hours. Scheduled pt to see [name] PA 01/25/24 at 1:50pm. Pts daughter wanted to know if Ellouise might call in antibiotics for her mother without seeing her since it has not been that long ago. Please advise.

## 2024-01-24 NOTE — Telephone Encounter (Signed)
 PT daughter is requesting to speak with a nurse regarding her severe diverticulitis and acid reflux.

## 2024-01-25 ENCOUNTER — Encounter: Payer: Self-pay | Admitting: Physician Assistant

## 2024-01-25 ENCOUNTER — Ambulatory Visit (INDEPENDENT_AMBULATORY_CARE_PROVIDER_SITE_OTHER): Admitting: Physician Assistant

## 2024-01-25 VITALS — BP 122/66 | HR 89 | Ht 62.0 in | Wt 107.0 lb

## 2024-01-25 DIAGNOSIS — T8379XA Other specified complications due to other genitourinary prosthetic materials, initial encounter: Secondary | ICD-10-CM

## 2024-01-25 DIAGNOSIS — R935 Abnormal findings on diagnostic imaging of other abdominal regions, including retroperitoneum: Secondary | ICD-10-CM | POA: Diagnosis not present

## 2024-01-25 DIAGNOSIS — R1031 Right lower quadrant pain: Secondary | ICD-10-CM | POA: Diagnosis not present

## 2024-01-25 DIAGNOSIS — R102 Pelvic and perineal pain unspecified side: Secondary | ICD-10-CM

## 2024-01-25 DIAGNOSIS — N390 Urinary tract infection, site not specified: Secondary | ICD-10-CM

## 2024-01-25 DIAGNOSIS — R1032 Left lower quadrant pain: Secondary | ICD-10-CM | POA: Diagnosis not present

## 2024-01-25 DIAGNOSIS — D649 Anemia, unspecified: Secondary | ICD-10-CM

## 2024-01-25 DIAGNOSIS — N739 Female pelvic inflammatory disease, unspecified: Secondary | ICD-10-CM

## 2024-01-25 NOTE — Progress Notes (Signed)
 "     Julie Console, PA-C [address] [place], [place]  [address] Phone: [phone]   Primary Care Physician: Joshua Debby CROME, MD  Primary Gastroenterologist:  Julie Console, PA-C / Dr. Gordy Starch   Chief Complaint: Lower abdominal pain, possible diverticulitis flare       HPI:   Discussed the use of AI scribe software for clinical note transcription with the patient, who gave verbal consent to proceed.  89 year old female, established patient of Dr. Starch, presents for possible recurrent diverticulitis.  She is here today with her daughter who helps with her care.  I last saw her 12/25/2023 for potential flareup of diverticulitis.  Started empiric treatment with Augmentin  600 mg 5 mL twice daily x 10 days.  Also ordered labs and CT.  Normal WBC 7.1.  Low yet stable hemoglobin 10.4, MCV 86.  Patient finished the Augmentin  and her lower abdominal pain resolved.  CT scan did not show any diverticulitis, however there was question of pelvic abscess.  Patient has complicated history of pelvic surgeries in the past.  She currently self catheterizes 3 or 4 times daily to pass urine.  12/25/2023 CT abdomen pelvis with contrast: 1. Similar appearance of an elongated complex collection containing gas and debris extending from the vaginal cuff to the anterior S1 presacral space. 2. Severe sigmoid diverticulosis. No active diverticulitis.  No bowel obstruction. 3. Large area of consolidation and cavitation involving the right lung base. 4.  Aortic Atherosclerosis  Previous history of hysterectomy, oophorectomy, cholecystectomy, bladder surgery and appendectomy.  She has pacemaker in place.   She was hospitalized for uncomplicated sigmoid diverticulitis 08/31/2023 until 09/04/2023 treated with Zosyn  and Augmentin .  Also treated for diverticulitis in hospital 02/2023.   PMH of history of diverticulitis, PAF on Eliquis , chronic anemia, bronchiectasis, pseudogout, CAD, recurrent UTI on chronic  cefdinir  for prophylaxis, history of vaginal mesh erosion with fistula, pulmonary MAC infection (followed by Dr. Theophilus pulmonologist).  History of Present Illness Julie Silva is a 89 year old female with prior extensive bladder and bowel reconstruction (In the 1990s) who presents with recurrent pelvic pain and vaginal bleeding following mesh repair.  Pelvic Pain: - Recurrent episodes of pelvic pain, previously occurring about once yearly, now increasing in frequency over the past year - Most recent flare in December, with current episode occurring less than a year apart - Pain is intermittent, sometimes diffuse across the pelvis, and has been more pronounced over the past few days - Pain is moderate - Completed a course of antibiotics for possible diverticulitis in December with improvement in pain - Currently taking Augmentin  which was started yesterday, recently increased to three times daily  Vaginal Bleeding and Mesh Erosion: - Persistent vaginal spotting for approximately two years - Mesh erosion through the top of the vaginal wall with visible mesh identified on evaluation - Patient has seen urogynecologist - Office cauterization performed previously; ongoing use of estradiol cream without resolution - Vaginal bleeding persists as spotting, with no passage of stool per vaginal canal  Urinary Retention and Self-Catheterization: - Chronic inability to void normally since bladder and bowel reconstruction with mesh placement in the 1990s - Ongoing intermittent self-catheterization three to four times daily due to persistent residual urine - No pressure or burning with catheterization - She typically takes cefdinir  for UTI prophylaxis daily.  She stopped cefdinir  when she is on Augmentin .  Diarrhea: - Cyclical diarrhea occurring approximately every seven to ten days, with periods of normal stooling in  between - No increase in diarrhea beyond baseline and no severe diarrhea with recent  antibiotic use  Pulmonary Mycobacterial Infection (MAI/MAC): - Diagnosis of MAI/MAC lung disease, with right lower lobe most affected - No current pulmonary symptoms  Functional Status: - Uses a walker at home and a wheelchair when out - Increased weakness over the past couple of days     Current Outpatient Medications  Medication Sig Dispense Refill   acetaminophen  (TYLENOL ) 500 MG tablet Take 1,000 mg by mouth See admin instructions. Take 1,000 mg by mouth at 6 AM and 8 PM- may take an additional 1,000 mg once a day as needed for pain     amoxicillin -clavulanate (AUGMENTIN  ES-600) 600-42.9 MG/5ML suspension Take 5 mLs (600 mg total) by mouth 3 (three) times daily. 200 mL 0   amoxicillin -clavulanate (AUGMENTIN ) 600-42.9 MG/5ML suspension Take 5 mLs (600 mg total) by mouth 2 (two) times daily. 100 mL 1   Catheters MISC Replace catheter twice daily. 180 each 1   dorzolamide -timolol  (COSOPT ) 22.3-6.8 MG/ML ophthalmic solution Place 1 drop into both eyes 2 (two) times daily.     ELIQUIS  2.5 MG TABS tablet TAKE 1 TABLET BY MOUTH TWICE A DAY 180 tablet 1   estradiol (ESTRACE) 0.1 MG/GM vaginal cream Place 0.5 g vaginally 3 (three) times a week.     gabapentin  (NEURONTIN ) 300 MG capsule Take 1 capsule (300 mg total) by mouth 2 (two) times daily. (Patient taking differently: Take 300 mg by mouth See admin instructions. Mix 300 mg into applesauce and take by mouth at 6 AM and 8 PM) 180 capsule 3   guaifenesin  (ROBITUSSIN) 100 MG/5ML syrup Take 200 mg by mouth 3 (three) times daily.     latanoprost  (XALATAN ) 0.005 % ophthalmic solution Place 1 drop into both eyes at bedtime.     Multiple Vitamins-Minerals (PRESERVISION AREDS 2) CHEW Chew 1 tablet by mouth 2 (two) times daily.     omeprazole  (PRILOSEC) 20 MG capsule TAKE 1 CAPSULE BY MOUTH EVERY DAY 90 capsule 1   OMEPRAZOLE  PO Take 20 mg by mouth See admin instructions. Dissolve 20 mg and place into applesauce- take by mouth at 6 AM     ondansetron   (ZOFRAN -ODT) 4 MG disintegrating tablet Take 1 tablet (4 mg total) by mouth every 8 (eight) hours as needed for nausea or vomiting. (Patient taking differently: Take 2 mg by mouth every 8 (eight) hours as needed for nausea or vomiting.) 30 tablet 0   prochlorperazine  (COMPAZINE ) 10 MG tablet Take 2.5 mg by mouth every 4 (four) hours as needed for nausea or vomiting.     sodium chloride  HYPERTONIC 3 % nebulizer solution INHALE 1 VIAL VIA NEBULIZER ONCE A DAY 750 mL 3   SYSTANE 0.4-0.3 % GEL ophthalmic gel Place 1 Application into both eyes 3 (three) times daily as needed (for irritation and dryness).     traMADol  (ULTRAM ) 50 MG tablet Take 25 mg by mouth every 6 (six) hours as needed (for kidney stone pain).     cefdinir  (OMNICEF ) 250 MG/5ML suspension Take 5 mLs (250 mg total) by mouth daily. (Patient not taking: Reported on 01/25/2024) 450 mL 1   No current facility-administered medications for this visit.    Allergies as of 01/25/2024 - Review Complete 01/25/2024  Allergen Reaction Noted   Codeine Other (See Comments) 02/02/2013   Colchicine  Diarrhea 07/30/2020   Tape Other (See Comments) 08/31/2023    Past Medical History:  Diagnosis Date   Anemia  Aortic atherosclerosis    Arthritis    Osteoporosis, neck stiffness   Atrial fibrillation (HCC)    Bleeds easily    father was a hemophiliac - pt not being bothered in recent years.   CAD (coronary artery disease)    Calcium deposit in bursa of knee 2017   Cancer (HCC)    skin cancer basal cell.   Chicken pox    Cyst, Baker's knee    left knee- tx. Cortisone injection 05-05-15 in office- improved pain relief and swelling left ankle and knee   Diverticulitis    Diverticulosis    Dysrhythmia    Dr. McAlhany-cardiology   GERD (gastroesophageal reflux disease)    Glaucoma of both eyes    Gout    Hearing loss of both ears    Bilateral ears- hearing aids used- right ear hearing betther than left.   Hiatal hernia    History of  blood transfusion 1957   lots; most related to my periods-last 1968 -s/p Hysterectomy   Migraines    years ago (02/04/2013)   Motion sickness    back seat - cars, sudden movements   PAF (paroxysmal atrial fibrillation) (HCC)    Pancreatic cyst    Pelvic floor dysfunction    PONV (postoperative nausea and vomiting)    Risk for falls    wobbley per daughter   Self-catheterizes urinary bladder    daily- retained urine and chronic UTI   SVT (supraventricular tachycardia)    Dr. KANDICE. Taylor-follows loop recorder left chest   Swallowing difficulty    freq occ. mostley liquids   Urine incontinence    UTI (urinary tract infection)    Chronic Urinary tract infections- tx Macrobid  daily.   Vertigo    Wears hearing aid in both ears     Past Surgical History:  Procedure Laterality Date   APPENDECTOMY  01/03/1946   BLADDER SURGERY  01/03/1993   Repair-    BREAST BIOPSY Bilateral    both were fine   CATARACT EXTRACTION W/ INTRAOCULAR LENS IMPLANT Left    CATARACT EXTRACTION W/PHACO Right 10/16/2018   Procedure: CATARACT EXTRACTION PHACO AND INTRAOCULAR LENS PLACEMENT (IOC)  RIGHT MiLoop diabetes 01:05.8  21.0%  13.77;  Surgeon: Jaye Fallow, MD;  Location: [facility];  Service: Ophthalmology;  Laterality: Right;  BS tends to drop in AM and would like early surgery so she can get home to eat, please   CHOLECYSTECTOMY  01/04/2012   COMBINED HYSTERECTOMY ABDOMINAL W/ A&P REPAIR / OOPHORECTOMY     DILATION AND CURETTAGE OF UTERUS     EP IMPLANTABLE DEVICE N/A 06/19/2014   Procedure: Loop Recorder Insertion;  Surgeon: Danelle LELON Birmingham, MD;  Location: [facility];  Service: Cardiovascular;  Laterality: N/A;   EXCISION OF BREAST BIOPSY Right 05/13/2015   Procedure: RIGHT BREAST EXCISIONAL BIOPSY x2;  Surgeon: Krystal Spinner, MD;  Location: [facility];  Service: General;  Laterality: Right;   PACEMAKER IMPLANT  02/2020   TONSILLECTOMY  01/04/1944   TOTAL ABDOMINAL  HYSTERECTOMY     VAGINAL HYSTERECTOMY  01/03/1966   vaginal mesh      Review of Systems:    All systems reviewed and negative except where noted in HPI.    Physical Exam:  BP 122/66   Pulse 89   Ht 5' 2 (1.575 m)   Wt 107 lb (48.5 kg)   BMI 19.57 kg/m  No LMP recorded. Patient has had a hysterectomy.  General: Feeble frail elderly thin  female in no acute distress.  She is in a wheelchair.  She is able to get on and off exam table with Mine and her daughter's help. Lungs: Clear to auscultation bilaterally. Non-labored. Heart: Regular rate and rhythm, no murmurs rubs or gallops.  Abdomen: Bowel sounds are normal; Abdomen is Soft; No hepatosplenomegaly, masses or hernias; moderate diffuse bilateral lower abdominal Tenderness; some guarding; no rebound tenderness. Neuro: Alert and oriented x 3.  Grossly intact.  Slow movement.  Wheelchair dependent. Psych: Alert and cooperative, normal mood and affect.   Imaging Studies: No results found.  Labs: CBC    Component Value Date/Time   WBC 7.1 12/25/2023 1419   RBC 3.68 (L) 12/25/2023 1419   HGB 10.4 (L) 12/25/2023 1419   HGB 12.4 02/23/2018 1525   HCT 31.8 (L) 12/25/2023 1419   HCT 36.2 02/23/2018 1525   PLT 332.0 12/25/2023 1419   PLT 239 02/23/2018 1525   MCV 86.4 12/25/2023 1419   MCV 93 02/23/2018 1525   MCV 93 07/03/2012 1533   MCH 28.3 09/02/2023 0715   MCHC 32.7 12/25/2023 1419   RDW 15.3 12/25/2023 1419   RDW 12.4 02/23/2018 1525   RDW 13.1 07/03/2012 1533   LYMPHSABS 1.5 12/25/2023 1419   LYMPHSABS 1.7 02/23/2018 1525   LYMPHSABS 1.2 07/03/2012 1533   MONOABS 0.8 12/25/2023 1419   MONOABS 0.6 07/03/2012 1533   EOSABS 0.2 12/25/2023 1419   EOSABS 0.1 02/23/2018 1525   EOSABS 0.1 07/03/2012 1533   BASOSABS 0.0 12/25/2023 1419   BASOSABS 0.0 02/23/2018 1525   BASOSABS 0.1 07/03/2012 1533    CMP     Component Value Date/Time   NA 138 12/25/2023 1419   NA 142 02/23/2018 1525   K 3.9 12/25/2023 1419   CL  103 12/25/2023 1419   CO2 25 12/25/2023 1419   GLUCOSE 104 (H) 12/25/2023 1419   BUN 26 (H) 12/25/2023 1419   BUN 17 02/23/2018 1525   CREATININE 0.92 12/25/2023 1419   CALCIUM 9.3 12/25/2023 1419   PROT 7.2 09/01/2023 0805   ALBUMIN 3.3 (L) 09/01/2023 0805   AST 18 09/01/2023 0805   ALT 5 09/01/2023 0805   ALKPHOS 101 09/01/2023 0805   BILITOT 0.5 09/01/2023 0805   GFRNONAA 56 (L) 09/02/2023 0715   GFRAA 70 02/23/2018 1525     Assessment and Plan:   Julie Silva is a 89 y.o. y/o female presents for:  1.  Recurrent bilateral lower abdominal and pelvic pain.  I am concerned about pelvic abscess. Previous history of diverticulitis.  Most recent abdominal pelvic CT 12/24/2024 showed severe diverticulosis but NO diverticulitis.  There was evidence of  elongated complex collection containing gas and debris extending from the vaginal cuff to the anterior S1 presacral space.  Possibly related to self urinary catheterization and extensive complicated pelvic surgeries with mesh erosion.   - I am ordering pelvic MRI for further evaluation. - Start Benefiber powder daily. - Finish Augmentin  600 mg 3 times daily x 10 days (liquid). - Instructed to monitor for severe diarrhea and report if it develops. - I will discuss case with Dr. Albertus for further recommendations.  2.  Abnormal CT imaging of the pelvis Elongated complex collection containing gas and debris extending from the vaginal cuff to the anterior S1 presacral space. - Pelvic MRI - May need urogynecology, surgical, or interventional radiology evaluation?  3.  Mesh erosion into vagina with history of pelvic reconstructive surgery Chronic mesh erosion with spotting, refractory to  treatment. Potential contributor to pelvic infection or abscess. - Ordered pelvic MRI to assess infection, abscess, and mesh erosion.  4.  Recurrent urinary tract infections due to chronic self-catheterization High risk for UTIs due to  self-catheterization, currently asymptomatic. Avoiding unnecessary antibiotics. - Avoided treatment of asymptomatic bacteriuria per urology - Continued cefdinir  prophylaxis when not on Augmentin . - Instructed to monitor for UTI symptoms and follow-up with urology if symptomatic.   Julie Console, PA-C  Follow up with Dr. Albertus in 6 weeks.   "

## 2024-01-25 NOTE — Patient Instructions (Addendum)
 You will be contacted by [facility] Sched[facility] in the next 2 days to arrange a MRI pelvis with/ without contrast.  The number on your caller ID will be [phone], please answer when they call.  If you have not heard from them in 2 days please call [phone] to schedule.     Follow up in 6 weeks with Dr. Albertus.  Thank you for trusting me with your gastrointestinal care!   [name], PA-C  _______________________________________________________  If your blood pressure at your visit was 140/90 or greater, please contact your primary care physician to follow up on this.  _______________________________________________________  If you are age 89 or older, your body mass index should be between 23-30. Your Body mass index is 19.57 kg/m. If this is out of the aforementioned range listed, please consider follow up with your Primary Care Provider.  If you are age 64 or younger, your body mass index should be between 19-25. Your Body mass index is 19.57 kg/m. If this is out of the aformentioned range listed, please consider follow up with your Primary Care Provider.   ________________________________________________________  The [facility] providers would like to encourage you to use MYCHART to communicate with providers for non-urgent requests or questions.  Due to long hold times on the telephone, sending your provider a message by [facility] may be a faster and more efficient way to get a response.  Please allow 48 business hours for a response.  Please remember that this is for non-urgent requests.  _______________________________________________________  [facility] is using a team-based approach to care.  Your team is made up of your doctor and two to three APPS. Our APPS (Nurse Practitioners and Physician Assistants) work with your physician to ensure care continuity for you. They are fully qualified to address your health concerns and develop a treatment plan.  They communicate directly with your gastroenterologist to care for you. Seeing the Advanced Practice Practitioners on your physician's team can help you by facilitating care more promptly, often allowing for earlier appointments, access to diagnostic testing, procedures, and other specialty referrals.

## 2024-01-26 NOTE — Progress Notes (Signed)
 Addendum: Reviewed and agree with assessment and management plan. Given symptoms and complex abdominal and pelvic anatomy, severe diverticular disease but also persistent complex collection containing gas and debris I feel that she should follow-up with her urogynecologist.  IR consultation could also be entertained but would start with urogynecology Kaizer Dissinger, Gordy HERO, MD

## 2024-01-31 ENCOUNTER — Encounter: Payer: Self-pay | Admitting: Oncology

## 2024-02-05 ENCOUNTER — Ambulatory Visit: Admitting: Physician Assistant

## 2024-03-19 ENCOUNTER — Other Ambulatory Visit (HOSPITAL_COMMUNITY)

## 2024-05-08 ENCOUNTER — Other Ambulatory Visit

## 2024-05-08 ENCOUNTER — Ambulatory Visit: Admitting: Primary Care
